# Patient Record
Sex: Male | Born: 1945
Health system: Southern US, Community
[De-identification: ages and names within clinical notes are randomized; demographics above are authoritative.]

## PROBLEM LIST (undated history)

## (undated) DIAGNOSIS — I219 Acute myocardial infarction, unspecified: Secondary | ICD-10-CM

## (undated) DIAGNOSIS — I255 Ischemic cardiomyopathy: Secondary | ICD-10-CM

## (undated) DIAGNOSIS — I4891 Unspecified atrial fibrillation: Secondary | ICD-10-CM

## (undated) DIAGNOSIS — K2100 Gastro-esophageal reflux disease with esophagitis, without bleeding: Secondary | ICD-10-CM

## (undated) DIAGNOSIS — E7800 Pure hypercholesterolemia, unspecified: Secondary | ICD-10-CM

## (undated) DIAGNOSIS — I2510 Atherosclerotic heart disease of native coronary artery without angina pectoris: Secondary | ICD-10-CM

## (undated) DIAGNOSIS — I509 Heart failure, unspecified: Secondary | ICD-10-CM

## (undated) DIAGNOSIS — K649 Unspecified hemorrhoids: Secondary | ICD-10-CM

## (undated) DIAGNOSIS — M47812 Spondylosis without myelopathy or radiculopathy, cervical region: Secondary | ICD-10-CM

## (undated) DIAGNOSIS — E559 Vitamin D deficiency, unspecified: Secondary | ICD-10-CM

## (undated) DIAGNOSIS — Z720 Tobacco use: Secondary | ICD-10-CM

## (undated) DIAGNOSIS — K219 Gastro-esophageal reflux disease without esophagitis: Secondary | ICD-10-CM

## (undated) DIAGNOSIS — D649 Anemia, unspecified: Secondary | ICD-10-CM

## (undated) DIAGNOSIS — I10 Essential (primary) hypertension: Secondary | ICD-10-CM

## (undated) HISTORY — DX: Unspecified atrial fibrillation: I48.91

## (undated) HISTORY — DX: Essential (primary) hypertension: I10

## (undated) HISTORY — DX: Pure hypercholesterolemia, unspecified: E78.00

## (undated) HISTORY — PX: CARDIAC CATHETERIZATION: SHX172

## (undated) HISTORY — DX: Atherosclerotic heart disease of native coronary artery without angina pectoris: I25.10

## (undated) HISTORY — DX: Tobacco use: Z72.0

## (undated) HISTORY — DX: Gastro-esophageal reflux disease without esophagitis: K21.9

## (undated) HISTORY — PX: COLONOSCOPY: SHX174

## (undated) HISTORY — DX: Anemia, unspecified: D64.9

## (undated) SURGERY — Surgical Case
Anesthesia: *Unknown

---
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
## 1998-11-25 HISTORY — PX: CORONARY STENT PLACEMENT: SHX1402

## 2007-12-08 ENCOUNTER — Encounter: Payer: Self-pay | Admitting: Cardiology

## 2009-09-15 ENCOUNTER — Encounter: Payer: Self-pay | Admitting: Cardiology

## 2009-10-14 ENCOUNTER — Encounter: Payer: Self-pay | Admitting: Cardiology

## 2009-12-22 ENCOUNTER — Encounter: Payer: Self-pay | Admitting: Cardiology

## 2010-01-31 DIAGNOSIS — I2510 Atherosclerotic heart disease of native coronary artery without angina pectoris: Secondary | ICD-10-CM | POA: Insufficient documentation

## 2010-01-31 DIAGNOSIS — F17200 Nicotine dependence, unspecified, uncomplicated: Secondary | ICD-10-CM | POA: Insufficient documentation

## 2010-01-31 DIAGNOSIS — I252 Old myocardial infarction: Secondary | ICD-10-CM | POA: Insufficient documentation

## 2010-01-31 DIAGNOSIS — E7800 Pure hypercholesterolemia, unspecified: Secondary | ICD-10-CM

## 2010-01-31 DIAGNOSIS — Z9861 Coronary angioplasty status: Secondary | ICD-10-CM | POA: Insufficient documentation

## 2010-02-01 ENCOUNTER — Encounter: Payer: Self-pay | Admitting: Cardiology

## 2010-04-13 ENCOUNTER — Ambulatory Visit: Payer: Self-pay | Admitting: Cardiology

## 2010-04-13 DIAGNOSIS — R809 Proteinuria, unspecified: Secondary | ICD-10-CM | POA: Insufficient documentation

## 2010-04-13 DIAGNOSIS — R1311 Dysphagia, oral phase: Secondary | ICD-10-CM | POA: Insufficient documentation

## 2010-05-08 ENCOUNTER — Telehealth (INDEPENDENT_AMBULATORY_CARE_PROVIDER_SITE_OTHER): Payer: Self-pay | Admitting: *Deleted

## 2010-08-02 ENCOUNTER — Encounter (INDEPENDENT_AMBULATORY_CARE_PROVIDER_SITE_OTHER): Payer: Self-pay | Admitting: *Deleted

## 2010-08-02 ENCOUNTER — Ambulatory Visit: Payer: Self-pay | Admitting: Physician Assistant

## 2010-12-25 NOTE — Letter (Signed)
Summary: External Correspondence/ PROGRESS NOTE DR. TYSINGER  External Correspondence/ PROGRESS NOTE DR. TYSINGER   Imported By: Dorise Hiss 01/31/2010 11:46:48  _____________________________________________________________________  External Attachment:    Type:   Image     Comment:   External Document

## 2010-12-25 NOTE — Assessment & Plan Note (Signed)
Summary: [facility]   Visit Type:  Follow-up Primary Provider:  Kristian Covey   History of Present Illness: patient presents for scheduling of a mandatory stress test. 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   Since he was initially seen here in the clinic in May, by Dr. Andee Lineman, patient denies any interim development of exertional angina pectoris or significant dyspnea. Unfortunately, he continues to smoke. He reports compliance with his medications.  When last seen, he was to have had a test for microalbuminuria. Unfortunately, this was not arranged.  Current Medications (verified): 1)  Bisoprolol Fumarate 10 Mg Tabs (Bisoprolol Fumarate) .... Take 1 Tablet By Mouth Once A Day 2)  Pravachol 80 Mg Tabs (Pravastatin Sodium) .... Take 1 Tablet By Mouth Once A Day 3)  Aspir-Low 81 Mg Tbec (Aspirin) .... Take 1 Tablet By Mouth Once A Day 4)  Amlodipine Besylate 10 Mg Tabs (Amlodipine Besylate) .... Take 1 Tablet By Mouth Once A Day 5)  Lisinopril 10 Mg Tabs (Lisinopril) .... Take 1 Tablet By Mouth Once A Day  Allergies (verified): No Known Drug Allergies  Past History:  Past Medical History: Last updated: 01/31/2010 Hypertension Elevated Cholesterol CAD with history of MI in 2000 Stent placed 2000 tobacco Use   Review of Systems       No fevers, chills, hemoptysis, dysphagia, melena, hematocheezia, hematuria, rash, claudication, orthopnea, pnd, pedal edema. All other systems negative.   Vital Signs:  Patient profile:   65 year old male Height:      64 inches Weight:      158 pounds BMI:     27.22 Pulse rate:   62 / minute Resp:     16 per minute BP sitting:   161 / 77  (left arm)  Vitals Entered By: Marrion Coy, CNA (August 02, 2010 1:25 PM)  Physical Exam  Additional Exam:  GEN: 65 year old male, sitting upright, no distress HEENT: NCAT,PERRLA,EOMI NECK: palpable pulses, no bruits; no JVD; no  TM LUNGS: CTA bilaterally HEART: RRR (S1S2); no significant murmurs; no rubs; no gallops ABD: soft, NT; intact BS EXT: intact distal pulses; no edema SKIN: warm, dry MUSC: no obvious deformity NEURO: A/O (x3)     EKG  Procedure date:  08/02/2010  Findings:      normal sinus rhythm at 60 bpm; normal axis; inferolateral T-wave inversion, with probable LVH. No previous study for comparison.  Impression & Recommendations:  Problem # 1:  CORONARY ATHEROSCLEROSIS NATIVE CORONARY ARTERY (ICD-414.01)  we'll schedule exercise stress Cardiolite test for tomorrow. If negative for ischemia, then patient is cleared to start his new job this following Monday. He will need to bring a copy of the report with him, as he is leaving this weekend for his new job in [place]. If, however, there is evidence of ischemia, then we will need to consider further evaluation with a cardiac catheterization. His resting EKG today does indicate possible inferolateral ischemia. However, he has no previous study for comparison, but presents with no complaints of chest pain. We will insure that he has p.r.n. nitroglycerin, as well, to take with him. We'll plan on having him return to the office in 6 months, to follow with Dr. Andee Lineman.  Problem # 2:  ESSENTIAL HYPERTENSION, BENIGN (ICD-401.1)  increase lisinopril to 20 mg daily. Check followup metabolic panel in 2 weeks (when patient returns from his job to be with family over the weekend). Will also  reorder urine for microalbuminuria.  Problem # 3:  NONDEPENDENT TOBACCO USE DISORDER (ICD-305.1)  patient was counseled to stop smoking tobacco.  Problem # 4:  DYSLIPIDEMIA (ICD-272.4)  we'll assess lipid status with a fasting lipid/liver profile. Target LDL of 70 or less, if feasible  Other Orders: Nuclear Med (Nuc Med) T-Basic Metabolic Panel [phone]) T-Urine Microalbumin w/creat. ratio [phone]) T-Lipid Profile [phone]) T-Hepatic Function  [phone])  Patient Instructions: 1)  Your physician has requested that you have an exercise stress cardiolite.  For further information please visit [email or web address]  Please follow instruction sheet, as given. If the results of your test are normal or stable, you will receive a letter. If they are abnormal, the nurse will contact you by phone.  2)  Your physician recommends that you go to the [facility] for lab work: DO IN 2 WEEKS AROUND 08/16/10. Do not eat or drink after midnight.  3)  Your physician recommended you take 1 tablet (or 1 spray) under tongue at onset of chest pain; you may repeat every 5 minutes for up to 3 doses. If 3 or more doses are required, call 911 and proceed to the ER immediately. 4)  Increase Lisinporil to 20mg  by mouth once daily.  Prescriptions: NITROSTAT 0.4 MG SUBL (NITROGLYCERIN) 1 tablet under tongue at onset of chest pain; you may repeat every 5 minutes for up to 3 doses.  #25 x 3   Entered by:   Cyril Loosen, RN, BSN   Authorized by:   Nelida Meuse, PA-C   Signed by:   Cyril Loosen, RN, BSN on 08/02/2010   Method used:   Electronically to        [facility] (retail)       [address]       [place], [place]  [address]       Ph: [phone]       Fax: [phone]   RxID:   [id number] LISINOPRIL 20 MG TABS (LISINOPRIL) Take one tablet by mouth daily  #30 x 6   Entered by:   Cyril Loosen, RN, BSN   Authorized by:   Nelida Meuse, PA-C   Signed by:   Cyril Loosen, RN, BSN on 08/02/2010   Method used:   Electronically to        [facility] (retail)       [address]       [place], [place]  [address]       Ph: [phone]       Fax: [phone]   RxID:   [id number]  I have reviewed and approved all prescriptions at the time of the office visit. Nelida Meuse, PA-C  August 02, 2010 2:25 PM

## 2010-12-25 NOTE — Progress Notes (Signed)
Summary: NEED MICROALBUMIN  ---- Converted from flag ---- ---- 05/07/2010 9:06 AM, Lewayne Bunting, MD, [facility] wrote: I need microalbuminuria test- [place] is useless. Need to get order in correctly Lisinopril only started if +/see my note .  Thanks  ---- 04/25/2010 4:41 PM, Carlye Grippe wrote: this patient had UA done not microalbumin. does he need to start the lisinopril or not? I fowarded the ua to PCP ------------------------------  Phone Note Outgoing Call Call back at [facility] Phone [phone]   Call placed by: Carlye Grippe,  May 08, 2010 10:22 AM Call placed to: Patient's wife Summary of Call: called and and infomred wife that patient need Microalbumin done before starting lisinopril. because of patients work hours,nurse infomed wife that someone can go and pick up specimen cup from [facility],then drop off after collection.  Initial call taken by: Carlye Grippe,  May 08, 2010 10:23 AM

## 2010-12-25 NOTE — Letter (Signed)
Summary: External Correspondence/ PROGRESS NOTE DR. TYSINGER  External Correspondence/ PROGRESS NOTE DR. TYSINGER   Imported By: Dorise Hiss 01/31/2010 11:45:06  _____________________________________________________________________  External Attachment:    Type:   Image     Comment:   External Document

## 2010-12-25 NOTE — Consult Note (Signed)
Summary: [facility] DR. Truman Hayward  [facility] DR. Truman Hayward   Imported By: Zachary George 01/31/2010 10:44:54  _____________________________________________________________________  External Attachment:    Type:   Image     Comment:   External Document

## 2010-12-25 NOTE — Letter (Signed)
Summary: External Correspondence/ PROGRESS NOTE DR. TYSINGER  External Correspondence/ PROGRESS NOTE DR. TYSINGER   Imported By: Dorise Hiss 01/31/2010 11:42:57  _____________________________________________________________________  External Attachment:    Type:   Image     Comment:   External Document

## 2010-12-25 NOTE — Letter (Signed)
Summary: Appointment -missed  [facility] [address]   [place], [place] [address]   Phone: [phone]  Fax: [phone]     February 01, 2010 MRN: [id number]     Ronald Mcdowell [address] [place], [place]  [address]     Dear Mr. LOS,  Our records indicate you missed your appointment on February 01, 2010                        with Dr.   Andee Lineman.   It is very important that we reach you to reschedule this appointment. We look forward to participating in your health care needs.   Please contact us at the number listed above at your earliest convenience to reschedule this appointment.   Sincerely,    Glass blower/designer

## 2010-12-25 NOTE — Assessment & Plan Note (Signed)
Summary: NP-ESTABLISHING WITH CARDIOLOGIST   Visit Type:  Initial Consult Primary Provider:  Kristian Covey  CC:  establishment.  History of Present Illness: the patient is a 65 year old male with a history of myocardial infarction in 2000 status post stent placement. This was performed in [place]. The patient has had no cardiology followup here in [place]. He did have auscultation approximately 2 years ago with neck cellulitis with MRSA. He still complains of dysphagia but no odynophagia, in particular he has difficulty with solid foods. He states however that his symptoms have worsened.  On recent physical examination a UA specimen was obtained and he was found to have proteinuria. However he has normal renal function. His wife tells me that he has been referred to urology he also has a history of hematuria and erectile dysfunction.is not clear the patient also has microalbuminuria and he will be tested for this.  at the end of 2010 the patient was evaluated in the ED and [place] for recurrent central chest pain. He reportedly ruled out for myocardial infarction. He had a stress test done which was negative for ischemia. He does have a history of hypertension which appears to be poorly controlled. He is compliant with his medications and is on high dose statin therapy. His most recent LDL is 123 with an HDL of 44. This reportedly however hospital for high-dose statin drug therapy. Further followup will be per his primary care physician.    Preventive Screening-Counseling & Management  Alcohol-Tobacco     Smoking Status: current     Smoking Cessation Counseling: yes     Packs/Day: 1/2 PPD  Current Medications (verified): 1)  Bisoprolol Fumarate 10 Mg Tabs (Bisoprolol Fumarate) .... Take 1 Tablet By Mouth Once A Day 2)  Pravachol 80 Mg Tabs (Pravastatin Sodium) .... Take 1 Tablet By Mouth Once A Day 3)  Aspir-Low 81 Mg Tbec (Aspirin) .... Take 1 Tablet By Mouth Once A Day 4)  Amlodipine  Besylate 10 Mg Tabs (Amlodipine Besylate) .... Take 1 Tablet By Mouth Once A Day 5)  Lisinopril 10 Mg Tabs (Lisinopril) .... Take 1 Tablet By Mouth Once A Day  Allergies (verified): No Known Drug Allergies  Comments:  Nurse/Medical Assistant: The patient's medications and allergies were reviewed with the patient and were updated in the Medication and Allergy Lists. List reviewed.  Past History:  Past Medical History: Last updated: 01/31/2010 Hypertension Elevated Cholesterol CAD with history of MI in 2000 Stent placed 2000 tobacco Use   Past Surgical History: Last updated: 01/31/2010 Cardiac Catheterization Stent placement  Family History: Reviewed history and no changes required. noncontributory  Social History: Reviewed history from 01/31/2010 and no changes required. smoking -yes Married  Regular Exercise - yes Smoking Status:  current Packs/Day:  1/2 PPD  Review of Systems  The patient denies fatigue, malaise, fever, weight gain/loss, vision loss, decreased hearing, hoarseness, chest pain, palpitations, shortness of breath, prolonged cough, wheezing, sleep apnea, coughing up blood, abdominal pain, blood in stool, nausea, vomiting, diarrhea, heartburn, incontinence, blood in urine, muscle weakness, joint pain, leg swelling, rash, skin lesions, headache, fainting, dizziness, depression, anxiety, enlarged lymph nodes, easy bruising or bleeding, and environmental allergies.    Vital Signs:  Patient profile:   65 year old male Height:      64 inches Weight:      160 pounds BMI:     27.56 Pulse rate:   57 / minute BP sitting:   157 / 75  (left arm) Cuff size:  regular  Vitals Entered By: Carlye Grippe (Apr 13, 2010 8:41 AM)  Nutrition Counseling: Patient's BMI is greater than 25 and therefore counseled on weight management options. CC: establishment   Physical Exam  Additional Exam:  General: Well-developed, well-nourished in no distress head:  Normocephalic and atraumatic eyes PERRLA/EOMI intact, conjunctiva and lids normal nose: No deformity or lesions mouth normal dentition, normal posterior pharynx neck: Supple, no JVD.  No masses, thyromegaly or abnormal cervical nodes lungs: Normal breath sounds bilaterally without wheezing.  Normal percussion heart: regular rate and rhythm with normal S1 and S2, no S3 or S4.  PMI is normal.  No pathological murmurs abdomen: Normal bowel sounds, abdomen is soft and nontender without masses, organomegaly or hernias noted.  No hepatosplenomegaly musculoskeletal: Back normal, normal gait muscle strength and tone normal pulsus: Pulse is normal in all 4 extremities Extremities: No peripheral pitting edema neurologic: Alert and oriented x 3 skin: Intact without lesions or rashes cervical nodes: No significant adenopathy psychologic: Normal affect    EKG  Procedure date:  04/13/2010  Findings:      sinus bradycardia, LVH nonspecific T wave changes.  Heart rate 54 beats/min  Impression & Recommendations:  Problem # 1:  CORONARY ATHEROSCLEROSIS NATIVE CORONARY ARTERY (ICD-414.01) the patient is status post prior myocardial infarction with a stent in 2000.  EKG however shows no acute ischemic changes.  He had a stress echocardiogram done in 2010 which was negative. His updated medication list for this problem includes:    Bisoprolol Fumarate 10 Mg Tabs (Bisoprolol fumarate) .[name]... Take 1 tablet by mouth once a day    Aspir-low 81 Mg Tbec (Aspirin) .[name]... Take 1 tablet by mouth once a day    Amlodipine Besylate 10 Mg Tabs (Amlodipine besylate) .[name]... Take 1 tablet by mouth once a day    Lisinopril 10 Mg Tabs (Lisinopril) .[name]... Take 1 tablet by mouth once a day  Orders: EKG w/ Interpretation (93000) T-Urinalysis ([id number])  Problem # 2:  PROTEINURIA (ICD-791.0)  the patient will be tested for microalbuminuria and he will be started on an ACE inhibitor with lisinopril 10 mg p.o. q. daily,  particularly in light of his hypertension.  Orders: T-Basic Metabolic Panel [id number])  Problem # 3:  POSTSURG PERCUT TRANSLUMINAL COR ANGPLSTY STS (ICD-V45.82) Assessment: Comment Only  Problem # 4:  PURE HYPERCHOLESTEROLEMIA (ICD-272.0) continue risk factor modification with high-dose statin. His updated medication list for this problem includes:    Pravachol 80 Mg Tabs (Pravastatin sodium) .[name]... Take 1 tablet by mouth once a day  Problem # 5:  DYSPHAGIA ORAL PHASE (ICD-787.21) the patient appears to have some dysphagia.  He has a prior history of neck cellulitis with MRSA  Asked him to discuss this with his primary care physician  Patient Instructions: 1)  Urinalysis for microablumineria 2)  If above positive, then start Lisinopril 10mg  daily  3)  Labs:  one week after starting above - BMET 4)  Follow up in  1 year.    Prescriptions: LISINOPRIL 10 MG TABS (LISINOPRIL) Take 1 tablet by mouth once a day  #30 x 11   Entered by:   Hoover Brunette, LPN   Authorized by:   Lewayne Bunting, MD, [facility]   Signed by:   Hoover Brunette, LPN on 66/04/3015   Method used:   Electronically to        [facility] (retail)       [address]  [place], [place]  [address]       Ph: [phone]       Fax: [phone]   RxID:   [id number]   Handout requested.

## 2010-12-25 NOTE — Letter (Signed)
Summary: Pharmacist, community at [facility] [address]   [place], [place] [address]   Phone: [phone]  Fax: [phone]      [facility] Cardiovascular Services  Cardiolite Stress Test     [facility]  Your doctor has ordered a Cardiolite Stress Test to help determine the condition of your heart during stress. If you take blood pressure medicine, ask your doctor if you should take it the day of your test. You should not have anything to eat or drink at least 4 hours before your test is scheduled, and no caffeine (coffee, tea, decaf. or chocolate) for 24 hours before your test.   You will need to register at the Outpatient/Main Entrance at the hospital 30 minutes before your appointment time. It is a good idea to bring a copy of your order with you. They will direct you to the Diagnostic Imaging (Radiology) Department.  You will be asked to undress from the waist up and be given a hospital gown to wear, so dress comfortably from the waist down, for example:    Sweat pants, shorts or skirt   Rubber-soled lace up shoes (i.e. tennis shoes)  Plan on about three hours from registration to release from the hospital.    Date of Test:              Time of Test              Do not take Bisoprolol the night before or the morning of your test.

## 2011-01-18 DIAGNOSIS — R079 Chest pain, unspecified: Secondary | ICD-10-CM

## 2011-05-23 ENCOUNTER — Encounter: Payer: Self-pay | Admitting: Physician Assistant

## 2011-12-09 ENCOUNTER — Ambulatory Visit (HOSPITAL_COMMUNITY)
Admission: RE | Admit: 2011-12-09 | Discharge: 2011-12-09 | Disposition: A | Discharge status: Home / Self Care | Payer: Commercial Managed Care - PPO | Source: Ambulatory Visit | Attending: Urology | Admitting: Urology

## 2011-12-09 ENCOUNTER — Other Ambulatory Visit (HOSPITAL_COMMUNITY): Payer: Self-pay | Admitting: Urology

## 2011-12-09 DIAGNOSIS — N400 Enlarged prostate without lower urinary tract symptoms: Secondary | ICD-10-CM | POA: Insufficient documentation

## 2011-12-09 DIAGNOSIS — I10 Essential (primary) hypertension: Secondary | ICD-10-CM | POA: Insufficient documentation

## 2011-12-09 DIAGNOSIS — R972 Elevated prostate specific antigen [PSA]: Secondary | ICD-10-CM

## 2012-01-06 ENCOUNTER — Encounter (HOSPITAL_COMMUNITY): Payer: Self-pay | Admitting: Pharmacy Technician

## 2012-01-07 ENCOUNTER — Other Ambulatory Visit: Payer: Self-pay

## 2012-01-07 ENCOUNTER — Encounter (HOSPITAL_COMMUNITY): Payer: Self-pay

## 2012-01-07 ENCOUNTER — Encounter (HOSPITAL_COMMUNITY)
Admission: RE | Admit: 2012-01-07 | Discharge: 2012-01-07 | Disposition: A | Discharge status: Home / Self Care | Payer: Commercial Managed Care - PPO | Source: Ambulatory Visit | Attending: Urology | Admitting: Urology

## 2012-01-07 HISTORY — DX: Acute myocardial infarction, unspecified: I21.9

## 2012-01-07 LAB — HEMOGLOBIN AND HEMATOCRIT, BLOOD
HCT: 42.6 % (ref 39.0–52.0)
Hemoglobin: 14.9 g/dL (ref 13.0–17.0)

## 2012-01-07 LAB — BASIC METABOLIC PANEL
BUN: 10 mg/dL (ref 6–23)
Chloride: 104 mEq/L (ref 96–112)
GFR calc Af Amer: >90 mL/min (ref >90)
Potassium: 3.2 mEq/L — ABNORMAL LOW (ref 3.5–5.1)
Sodium: 142 mEq/L (ref 135–145)

## 2012-01-07 NOTE — Patient Instructions (Signed)
20 Ronald Mcdowell  01/07/2012   Your procedure is scheduled on:  01/09/12  Report to [facility] at 10:10 AM.  Call this number if you have problems the morning of surgery: [phone]   Remember:   Do not eat food:After Midnight.  May have clear liquids:until Midnight .  Clear liquids include soda, tea, black coffee, apple or grape juice, broth.  Take these medicines the morning of surgery with A SIP OF WATER: Norvasc, Zebeta, and Prinivil.   Do not wear jewelry, make-up or nail polish.  Do not wear lotions, powders, or perfumes. You may wear deodorant.  Do not shave 48 hours prior to surgery.  Do not bring valuables to the hospital.  Contacts, dentures or bridgework may not be worn into surgery.  Leave suitcase in the car. After surgery it may be brought to your room.  For patients admitted to the hospital, checkout time is 11:00 AM the day of discharge.   Patients discharged the day of surgery will not be allowed to drive home.  Name and phone number of your driver:   Special Instructions: CHG Shower Use Special Wash: 1/2 bottle night before surgery and 1/2 bottle morning of surgery.   Please read over the following fact sheets that you were given: Pain Booklet, MRSA Information, Surgical Site Infection Prevention, Anesthesia Post-op Instructions and Care and Recovery After Surgery    Transurethral Resection of the Prostate Transurethral Resection of the Prostate (TURP) is often treatment for non-cancerous (benign) prostatic hyperplasia (BPH) or prostate cancer. BPH commonly begins in middle aged men and can cause symptoms at any age thereafter. The complications that stem from these problems, such as recurrent infection and bladder control and emptying problems, are often helped by this procedure. Both conditions usually cause the prostate to increase in size. TURP is a major surgery that removes part of the prostate gland. The goal is to remove enough prostate to allow for unobstructed  flow of urine. TREATMENT This surgery (TURP procedure) is done using an instrument like a narrow telescope to look into your bladder. This is then used to remove enlarged pieces of your prostate, one piece at a time. This removes the blockage and makes it easier for you to urinate. This is called a transurethral resection of the prostate. A lesser procedure is sometimes done. In this procedure, small cuts are made in the prostate. This lessens the prostates pressure on the urethra. This is called a transurethral incision (cut by the surgeon) of the prostate (TUIP). You will probably be comfortable soon after the operation, but it will take 10 to 12 weeks, or longer, for your prostate to heal after you leave the hospital.  LET YOUR CAREGIVER KNOW ABOUT:  Allergies.   Medications taken including herbs, eye drops, over the counter medications, and creams.   Use of steroids (by mouth or creams).   Previous problems with anesthetics or Novocaine.   History of blood clots (thrombophlebitis).   History of bleeding or blood problems.   Previous surgery.   Previous prostate infections.   Other health problems.  RISKS AND COMPLICATIONS  Rare injury to the bowel (intestine).   Rare injury to the bladder or adjacent blood vessels.   Intestinal/bowel obstruction.   Scarring called "stricture" that cause later problems with the flow of urine.   Bleeding and the need for blood transfusion (more common in those with prostate cancer and those who have previously received radiation therapy).   Inability to control your urine (  incontinence). This is more common in those with prostate cancer and those who have received radiation therapy.   Injury to one of the ureters (tubes that drain the kidneys into the bladder) and/or urethra (the tube that drains the bladder).   Injury to the capsule that holds the prostate. This can lead to leakage of fluid and urine into the belly (abdomen).   The  operation can possibly lead to impotence. This is the inability to get an erection. Treatments are available for these types of problems.   Rare over absorption of the fluids used during the operation. This can then cause low blood levels of sodium, brain swelling, strain on the heart, and fluid accumulation in the lungs. This is more common in long procedures.   Infection.   Blood clots in the legs.   As with any major surgery, there is always the rare chance of a complicating stroke, heart attack, or other complications that will be discussed with you by the surgeon and anesthesiologist.  BEFORE THE PROCEDURE   You may be asked to temporarily adjust your diet. If so, your caregiver will give you specific recommendations.   If you are on blood thinners, stop taking them before the operation, or as your caregiver advises.   You should have nothing to eat or drink after midnight prior to your surgery or as suggested by your caregiver. You may have a sip of water to take medications not stopped for the procedure  PROCEDURE  This operation is performed after you have been given a medication to help you sleep (anesthetic), or with a spinal block. The spinal block keeps you awake but numb from the waist down.  No cut or incision is needed. During the operation, your surgeon passes a viewing and cutting instrument (resectoscope) through the penis into the prostate gland. The instrument contains an electric cutting edge. From inside the prostate, this cutter is used to remove part of the prostate.  HOME CARE INSTRUCTIONS  For your own protection, observe the following precautions for 10 days after your operation.  You may go home with a catheter. Take care of it as directed. You will receive instruction on catheter care.   After catheter removal, empty the bladder whenever you feel a definite desire. Do not try to hold the urine for long periods of time.   For 10 days, avoid all lifting,  straining, running, strenuous work, walks longer than a couple blocks, riding in a car for extended periods, and sexual relations.   Take 2 tablespoons of heavy mineral oil or Metamucil night and morning for 3 or 4 days. After that, gradually reduce the dose to one or two teaspoons twice daily. Stop it after the stools have been normal for a week. If you become constipated, do not strain to move your bowels. You may use an enema. Notify your caregiver about problems.   Even after complete healing, you may continue to urinate once or twice during the night.   In addition to your usual medications, you may be given an antibiotic to take for 10-14 days. Notify your caregiver if you have any side effects or problems with the medication.   Avoid alcohol and caffeinated drinks for 2 weeks, as they are irritating to the bladder. Decaffeinated drinks are fine.   Eat a regular diet, avoiding spicy foods for 2 weeks.   You may continue non-strenuous activities. It is always important to keep active after an operation. This lessens the chance of  developing blood clots.   You may see some recurrence of blood in the urine after discharge from the hospital. Even a small amount of blood colors the urine very red. If this occurs, force fluids again as you did in the hospital. This is generally not a concern.  SEEK MEDICAL CARE IF:   You have chills or night sweats.   You are leaking around your catheter or have problems with your catheter.   You develop side effects that you think are coming from your medications.  SEEK IMMEDIATE MEDICAL CARE IF:   You are suddenly unable to urinate. This is an emergency.   You develop shortness of breath or chest pains.   Bleeding persists or clots develop.   You have a fever.   You develop pain in your back or over your lower belly (abdomen).   You develop pain or swelling in your legs.   You develop swelling in your abdomen or have a sudden weight gain.    The problems get worse rather than better.  Document Released: 11/11/2005 Document Revised: 07/24/2011 Document Reviewed: 05/30/2009 [facility] Patient Information 2012 [place], [place].

## 2012-01-09 ENCOUNTER — Encounter (HOSPITAL_COMMUNITY): Payer: Self-pay

## 2012-01-09 ENCOUNTER — Other Ambulatory Visit: Payer: Self-pay | Admitting: Urology

## 2012-01-09 ENCOUNTER — Encounter (HOSPITAL_COMMUNITY): Payer: Self-pay | Admitting: Anesthesiology

## 2012-01-09 ENCOUNTER — Ambulatory Visit (HOSPITAL_COMMUNITY)
Admission: RE | Admit: 2012-01-09 | Discharge: 2012-01-10 | Disposition: A | Discharge status: Home / Self Care | Payer: Commercial Managed Care - PPO | Source: Ambulatory Visit | Attending: Urology | Admitting: Urology

## 2012-01-09 ENCOUNTER — Encounter (HOSPITAL_COMMUNITY)
Admission: RE | Disposition: A | Discharge status: Home / Self Care | Payer: Self-pay | Source: Ambulatory Visit | Attending: Urology

## 2012-01-09 ENCOUNTER — Encounter (HOSPITAL_COMMUNITY): Payer: Self-pay | Admitting: *Deleted

## 2012-01-09 ENCOUNTER — Ambulatory Visit (HOSPITAL_COMMUNITY): Payer: Commercial Managed Care - PPO | Admitting: Anesthesiology

## 2012-01-09 DIAGNOSIS — N400 Enlarged prostate without lower urinary tract symptoms: Secondary | ICD-10-CM | POA: Insufficient documentation

## 2012-01-09 DIAGNOSIS — Z01810 Encounter for preprocedural cardiovascular examination: Secondary | ICD-10-CM | POA: Insufficient documentation

## 2012-01-09 DIAGNOSIS — Z79899 Other long term (current) drug therapy: Secondary | ICD-10-CM | POA: Insufficient documentation

## 2012-01-09 DIAGNOSIS — I252 Old myocardial infarction: Secondary | ICD-10-CM | POA: Insufficient documentation

## 2012-01-09 DIAGNOSIS — R9431 Abnormal electrocardiogram [ECG] [EKG]: Secondary | ICD-10-CM | POA: Insufficient documentation

## 2012-01-09 DIAGNOSIS — I10 Essential (primary) hypertension: Secondary | ICD-10-CM | POA: Insufficient documentation

## 2012-01-09 DIAGNOSIS — Z01812 Encounter for preprocedural laboratory examination: Secondary | ICD-10-CM | POA: Insufficient documentation

## 2012-01-09 HISTORY — PX: TRANSURETHRAL RESECTION OF PROSTATE: SHX73

## 2012-01-09 LAB — TYPE AND SCREEN
ABO/RH(D): A POS
Antibody Screen: NEGATIVE

## 2012-01-09 LAB — POTASSIUM: Potassium: 3.5 mEq/L (ref 3.5–5.1)

## 2012-01-09 SURGERY — TURP (TRANSURETHRAL RESECTION OF PROSTATE)
Anesthesia: Spinal | Site: Bladder | Wound class: Clean Contaminated

## 2012-01-09 MED ORDER — SODIUM CHLORIDE 0.9 % IR SOLN
3000.0000 mL | Status: DC
  Administered 2012-01-09 – 2012-01-10 (×16): 3000 mL

## 2012-01-09 MED ORDER — LISINOPRIL 10 MG PO TABS
20.0000 mg | ORAL_TABLET | Freq: Every day | ORAL | Status: DC
  Administered 2012-01-09 – 2012-01-10 (×2): 20 mg via ORAL
  Filled 2012-01-09 (×2): qty 2

## 2012-01-09 MED ORDER — AMLODIPINE BESYLATE 5 MG PO TABS
10.0000 mg | ORAL_TABLET | Freq: Every day | ORAL | Status: DC
  Administered 2012-01-09 – 2012-01-10 (×2): 10 mg via ORAL
  Filled 2012-01-09 (×2): qty 2

## 2012-01-09 MED ORDER — PROPOFOL 10 MG/ML IV EMUL
INTRAVENOUS | Status: DC | PRN
  Administered 2012-01-09: 25 ug/kg/min via INTRAVENOUS

## 2012-01-09 MED ORDER — GLYCINE 1.5 % IR SOLN
Status: DC | PRN
  Administered 2012-01-09: 18000 mL

## 2012-01-09 MED ORDER — MIDAZOLAM HCL 2 MG/2ML IJ SOLN
INTRAMUSCULAR | Status: AC
  Filled 2012-01-09: qty 2

## 2012-01-09 MED ORDER — PROPOFOL 10 MG/ML IV EMUL
INTRAVENOUS | Status: AC
  Filled 2012-01-09: qty 20

## 2012-01-09 MED ORDER — ONDANSETRON HCL 4 MG/2ML IJ SOLN: 4.0000 mg | Freq: Once | INTRAMUSCULAR | Status: DC | PRN

## 2012-01-09 MED ORDER — BISOPROLOL FUMARATE 5 MG PO TABS
10.0000 mg | ORAL_TABLET | Freq: Every day | ORAL | Status: DC
  Administered 2012-01-09 – 2012-01-10 (×2): 10 mg via ORAL
  Filled 2012-01-09: qty 1
  Filled 2012-01-09: qty 2
  Filled 2012-01-09: qty 1
  Filled 2012-01-09 (×2): qty 2

## 2012-01-09 MED ORDER — GLYCINE 1.5 % IR SOLN
Status: DC | PRN
  Administered 2012-01-09: 6000 mL

## 2012-01-09 MED ORDER — FENTANYL CITRATE 0.05 MG/ML IJ SOLN
INTRAMUSCULAR | Status: DC | PRN
  Administered 2012-01-09: 50 ug via INTRAVENOUS

## 2012-01-09 MED ORDER — MIDAZOLAM HCL 2 MG/2ML IJ SOLN
1.0000 mg | INTRAMUSCULAR | Status: DC | PRN
  Administered 2012-01-09: 2 mg via INTRAVENOUS

## 2012-01-09 MED ORDER — STERILE WATER FOR IRRIGATION IR SOLN
Status: DC | PRN
  Administered 2012-01-09: 1000 mL

## 2012-01-09 MED ORDER — LACTATED RINGERS IV SOLN
INTRAVENOUS | Status: DC
  Administered 2012-01-09: 1000 mL via INTRAVENOUS
  Administered 2012-01-09: 13:00:00 via INTRAVENOUS

## 2012-01-09 MED ORDER — NITROGLYCERIN 0.4 MG SL SUBL: 0.4000 mg | SUBLINGUAL_TABLET | SUBLINGUAL | Status: DC | PRN

## 2012-01-09 MED ORDER — HYDROCODONE-ACETAMINOPHEN 5-325 MG PO TABS
1.0000 | ORAL_TABLET | Freq: Four times a day (QID) | ORAL | Status: DC | PRN
  Filled 2012-01-09: qty 1

## 2012-01-09 MED ORDER — FENTANYL CITRATE 0.05 MG/ML IJ SOLN
INTRAMUSCULAR | Status: AC
  Filled 2012-01-09: qty 2

## 2012-01-09 MED ORDER — BUPIVACAINE IN DEXTROSE 0.75-8.25 % IT SOLN
INTRATHECAL | Status: AC
  Filled 2012-01-09: qty 2

## 2012-01-09 MED ORDER — DEXTROSE-NACL 5-0.45 % IV SOLN
INTRAVENOUS | Status: DC
  Administered 2012-01-09: 18:00:00 via INTRAVENOUS
  Administered 2012-01-10: 100 mL/h via INTRAVENOUS

## 2012-01-09 MED ORDER — FENTANYL CITRATE 0.05 MG/ML IJ SOLN: 25.0000 ug | INTRAMUSCULAR | Status: DC | PRN

## 2012-01-09 SURGICAL SUPPLY — 32 items
BAG DECANTER FOR FLEXI CONT (MISCELLANEOUS) ×2 IMPLANT
BAG DRAIN URO TABLE W/ADPT NS (DRAPE) ×2 IMPLANT
BAG URINE DRAIN TURP 4L (OSTOMY) IMPLANT
CABLE HI FREQUENCY MONOPOLAR (ELECTROSURGICAL) ×2 IMPLANT
CATH FOLEY 3WAY 30CC 22F (CATHETERS) IMPLANT
CLOTH BEACON ORANGE TIMEOUT ST (SAFETY) ×2 IMPLANT
CONNECTOR 5 IN 1 STRAIGHT STRL (MISCELLANEOUS) IMPLANT
DRAPE STERI URO 23X35 APER SZ5 (DRAPE) ×2 IMPLANT
ELECT CUT LOOP C-MAX 27FR .012 (CUTTING LOOP) ×2
ELECT REM PT RETURN 9FT ADLT (ELECTROSURGICAL) ×2
ELECTRODE CUT LP CMX 27FR .012 (CUTTING LOOP) ×1 IMPLANT
ELECTRODE REM PT RTRN 9FT ADLT (ELECTROSURGICAL) ×1 IMPLANT
FLOOR PAD 36X40 (MISCELLANEOUS)
FORMALIN 10 PREFIL 480ML (MISCELLANEOUS) IMPLANT
GLOVE BIO SURGEON STRL SZ7 (GLOVE) ×2 IMPLANT
GLOVE ECLIPSE 6.5 STRL STRAW (GLOVE) ×2 IMPLANT
GLOVE EXAM NITRILE MD LF STRL (GLOVE) ×2 IMPLANT
GLOVE INDICATOR 7.0 STRL GRN (GLOVE) ×4 IMPLANT
GLYCINE 1.5% IRRIG UROMATIC (IV SOLUTION) ×16 IMPLANT
GOWN STRL REIN XL XLG (GOWN DISPOSABLE) ×4 IMPLANT
IV NS IRRIG 3000ML ARTHROMATIC (IV SOLUTION) IMPLANT
KIT ROOM TURNOVER AP CYSTO (KITS) ×2 IMPLANT
MANIFOLD NEPTUNE II (INSTRUMENTS) ×2 IMPLANT
PACK CYSTO (CUSTOM PROCEDURE TRAY) ×2 IMPLANT
PAD ARMBOARD 7.5X6 YLW CONV (MISCELLANEOUS) ×2 IMPLANT
PAD FLOOR 36X40 (MISCELLANEOUS) IMPLANT
SET IRRIGATING DISP (SET/KITS/TRAYS/PACK) ×2 IMPLANT
SYR 30ML LL (SYRINGE) ×2 IMPLANT
TOWEL OR 17X26 4PK STRL BLUE (TOWEL DISPOSABLE) ×2 IMPLANT
WATER STERILE IRR 1000ML POUR (IV SOLUTION) ×2 IMPLANT
XPEEDA 550 SIDEFIRING FIBER (MISCELLANEOUS) IMPLANT
YANKAUER SUCT BULB TIP 10FT TU (MISCELLANEOUS) ×2 IMPLANT

## 2012-01-09 NOTE — Progress Notes (Signed)
No change in H&P on reexamination. 

## 2012-01-09 NOTE — H&P (Signed)
Ronald Mcdowell, MINER                ACCOUNT NO.:  [id number]  MEDICAL RECORD NO.:  [id number]  LOCATION:                                 FACILITY:  PHYSICIAN:  Ky Barban, M.D.DATE OF BIRTH:  17-Dec-1945  DATE OF ADMISSION: DATE OF DISCHARGE:  LH                             HISTORY & PHYSICAL   CHIEF COMPLAINT:  Symptoms of prostatism.  HISTORY:  A 66 year old gentleman who was seen first by me on January 14th and he told me 3 weeks before that he was in [place], went into retention, went to the emergency room and they catheterized him and got large amount of urine.  He was sent home without any catheter and he is voiding, but his urinary stream is slow and weak.  He has symptoms of prostatism for long time and then last week I did a cystoscopy, was found to have BPH with trilobar hypertrophy of the prostate with bladder neck obstruction.  He was not treated with Flomax.  PAST MEDICAL HISTORY:  He has hypertension and takes medicine and had MI in 2000 and has coronary stent since then, but not any on blood thinner. No history of any other significant medical problem.  FAMILY HISTORY:  No history of prostate cancer.  PERSONAL HISTORY:  Smokes half pack a day.  Does not drink alcohol, and he is married, lives with his wife.  REVIEW OF SYSTEMS:  Denies any chest pain, orthopnea, PND, nausea, vomiting.  MEDICATIONS:  He takes amlodipine, Prevacid, and lisinopril.  PHYSICAL EXAMINATION:  VITAL SIGNS:  Moderately built, not in acute distress. VITAL SIGNS:  Blood pressure 107/58, temperature 97.9. CENTRAL NERVOUS SYSTEM:  No gross neurological deficit. HEAD, NECK, EYE, ENT:  Negative. CHEST:  Symmetrical. HEART:  Regular sinus rhythm.  No murmur. ABDOMEN:  Soft, flat.  Liver, spleen, kidneys not palpable.  No CVA tenderness. PELVIC:  External genitalia is circumcised, meatus adequate.  Testicles are normal. RECTAL:  Normal sphincter tone.  No rectal mass.  Prostate  1.5+, smooth and firm.  IMPRESSION:  Trilobar hypertrophy of the prostate with bladder neck obstruction.  Recommend TUR of prostate.  NOTE:  The patient has significant symptoms of prostatism and also has trilobar hypertrophy, well-developed median lobe.  I do not think Flomax will be a good idea for long-term treatment.  I think TUR of prostate will be better in this situation.  I discussed these with the patient and his wife, gave him the option.  He elected to go ahead and do TUR of prostate for which he is coming as outpatient.  Procedure, limitation, and complication discussed.  They understand, want me to go ahead and proceed.     Ky Barban, M.D.     MIJ/MEDQ  D:  01/08/2012  T:  01/09/2012  Job:  [id number]

## 2012-01-09 NOTE — Anesthesia Procedure Notes (Signed)
Spinal  Patient location during procedure: OR Start time: 01/09/2012 12:32 PM Staffing CRNA/Resident: Glynn Octave Preanesthetic Checklist Completed: patient identified, site marked, surgical consent, pre-op evaluation, timeout performed, IV checked, risks and benefits discussed and monitors and equipment checked Spinal Block Patient position: left lateral decubitus Prep: Betadine Patient monitoring: heart rate, cardiac monitor, continuous pulse ox and blood pressure Approach: midline Location: L3-4 Injection technique: single-shot Needle Needle type: Spinocan  Needle gauge: 22 G Needle length: 9 cm Assessment Sensory level: T8 Additional Notes Tray # [id number],  Exp. 09/2012

## 2012-01-09 NOTE — Anesthesia Preprocedure Evaluation (Addendum)
Anesthesia Evaluation  Patient identified by MRN, date of birth, ID band Patient awake    Reviewed: Allergy & Precautions, H&P , NPO status , Patient's Chart, lab work & pertinent test results  Airway Mallampati: II      Dental  (+) Edentulous Upper and Edentulous Lower   Pulmonary Current Smoker,  clear to auscultation        Cardiovascular hypertension, Pt. on medications + CAD, + Past MI and + Cardiac Stents Regular Normal    Neuro/Psych    GI/Hepatic   Endo/Other    Renal/GU Renal disease: BPH.     Musculoskeletal   Abdominal   Peds  Hematology   Anesthesia Other Findings   Reproductive/Obstetrics                          Anesthesia Physical Anesthesia Plan  ASA: III  Anesthesia Plan: Spinal   Post-op Pain Management:    Induction:   Airway Management Planned: Nasal Cannula  Additional Equipment:   Intra-op Plan:   Post-operative Plan:   Informed Consent: I have reviewed the patients History and Physical, chart, labs and discussed the procedure including the risks, benefits and alternatives for the proposed anesthesia with the patient or authorized representative who has indicated his/her understanding and acceptance.     Plan Discussed with:   Anesthesia Plan Comments:         Anesthesia Quick Evaluation

## 2012-01-09 NOTE — Transfer of Care (Signed)
Immediate Anesthesia Transfer of Care Note  Patient: Ronald Mcdowell  Procedure(s) Performed: Procedure(s) (LRB): TRANSURETHRAL RESECTION OF THE PROSTATE (TURP) (N/A)  Patient Location: PACU  Anesthesia Type: Spinal  Level of Consciousness: awake, alert  and oriented  Airway & Oxygen Therapy: Patient Spontanous Breathing  Post-op Assessment: Report given to PACU RN  Post vital signs: Reviewed and stable  Complications: No apparent anesthesia complications

## 2012-01-09 NOTE — Anesthesia Postprocedure Evaluation (Signed)
  Anesthesia Post-op Note  Patient: Ronald Mcdowell  Procedure(s) Performed: Procedure(s) (LRB): TRANSURETHRAL RESECTION OF THE PROSTATE (TURP) (N/A)  Patient Location: PACU  Anesthesia Type: Spinal  Level of Consciousness: awake, alert  and oriented  Airway and Oxygen Therapy: Patient Spontanous Breathing  Post-op Pain: none  Post-op Assessment: Post-op Vital signs reviewed, Patient's Cardiovascular Status Stable, Respiratory Function Stable, Patent Airway and No signs of Nausea or vomiting  Post-op Vital Signs: Reviewed and stable  Complications: No apparent anesthesia complications

## 2012-01-09 NOTE — Brief Op Note (Signed)
01/09/2012  1:26 PM  PATIENT:  Ronald Mcdowell  66 y.o. male  PRE-OPERATIVE DIAGNOSIS:  bph  POST-OPERATIVE DIAGNOSIS:  bph  PROCEDURE:  Procedure(s) (LRB): TRANSURETHRAL RESECTION OF THE PROSTATE (TURP) (N/A)  SURGEON:  Surgeon(s) and Role:    * Ky Barban, MD - Primary  PHYSICIAN ASSISTANT:   ASSISTANTS: none   ANESTHESIA:spinal  EBL:  Total I/O In: 100 [I.V.:100] Out: -   BLOOD ADMINISTERED:none  DRAINS: Urinary Catheter (Foley)   LOCAL MEDICATIONS USED:  NONE  SPECIMEN:  Source of Specimen:  prostate chips  DISPOSITION OF SPECIMEN:  PATHOLOGY  COUNTS:  YES  TOURNIQUET:  * No tourniquets in log *  DICTATION: .Other Dictation: Dictation Number [id number]  PLAN OF CARE: Admit for overnight observation  PATIENT DISPOSITION:  PACU - hemodynamically stable.   Delay start of Pharmacological VTE agent (>24hrs) due to surgical blood loss or risk of bleeding:

## 2012-01-10 ENCOUNTER — Other Ambulatory Visit: Payer: Self-pay | Admitting: Urology

## 2012-01-10 LAB — BASIC METABOLIC PANEL
BUN: 6 mg/dL (ref 6–23)
CO2: 27 mEq/L (ref 19–32)
Glucose, Bld: 108 mg/dL — ABNORMAL HIGH (ref 70–99)
Potassium: 3.2 mEq/L — ABNORMAL LOW (ref 3.5–5.1)
Sodium: 137 mEq/L (ref 135–145)

## 2012-01-10 LAB — CBC
HCT: 41.4 % (ref 39.0–52.0)
Hemoglobin: 14.2 g/dL (ref 13.0–17.0)
MCH: 29.8 pg (ref 26.0–34.0)
MCV: 87 fL (ref 78.0–100.0)
RBC: 4.76 MIL/uL (ref 4.22–5.81)

## 2012-01-10 NOTE — Progress Notes (Signed)
Patient did very well overnight.  He complained of no pain and his CBI was continued and is now running yellow and clear.

## 2012-01-10 NOTE — Addendum Note (Signed)
Addendum  created 01/10/12 1345 by Despina Hidden, CRNA   Modules edited:Notes Section

## 2012-01-10 NOTE — Progress Notes (Signed)
Patient discharged home via family; Pt given and explained discharge instructions, prescriptions, carenotes; stated understanding and denied questions; pts IV removed without problems; foley home care instructions given with verbalized understandings (including maintaining connections, emptying standard drainage bag, maintaining drainage bag below the level of the bladder, call MD if hematuria worsens or large clots noticed in urine), pt encouraged to stay hydrated; pt stable at time of discharge

## 2012-01-10 NOTE — Anesthesia Postprocedure Evaluation (Signed)
  Anesthesia Post-op Note  Patient: Ronald Mcdowell  Procedure(s) Performed: Procedure(s) (LRB): TRANSURETHRAL RESECTION OF THE PROSTATE (TURP) (N/A)  Patient Location: room 335  Anesthesia Type: Spinal  Level of Consciousness: awake, alert , oriented, sedated and patient cooperative  Airway and Oxygen Therapy: Patient Spontanous Breathing  Post-op Pain: none  Post-op Assessment: Post-op Vital signs reviewed, Patient's Cardiovascular Status Stable, Respiratory Function Stable, Patent Airway, No signs of Nausea or vomiting and Pain level controlled  Post-op Vital Signs: Reviewed and stable  Complications: No apparent anesthesia complications

## 2012-01-10 NOTE — Progress Notes (Signed)
UR Chart Review Completed  

## 2012-01-10 NOTE — Op Note (Signed)
Ronald Mcdowell, Ronald Mcdowell                ACCOUNT NO.:  [id number]  MEDICAL RECORD NO.:  [id number]  LOCATION:  A335                          FACILITY:  [facility]  PHYSICIAN:  Ky Barban, M.D.DATE OF BIRTH:  19-Apr-1946  DATE OF PROCEDURE: DATE OF DISCHARGE:                              OPERATIVE REPORT   SURGEON:  Ky Barban, MD  PREOPERATIVE DIAGNOSIS:  Benign prostatic hypertrophy.  POSTOPERATIVE DIAGNOSIS:  Benign prostatic hypertrophy.  PROCEDURE:  TUR of prostate.  ANESTHESIA:  Spinal.  PROCEDURE:  The patient under spinal anesthesia in lithotomy position. After usual prep and drape, #28 Iglesias resectoscope was introduced into the bladder and once again, the bladder was inspected. Resectoscope was pulled back in mid prostatic urethra and median lobe was resected to the level of the mid prostatic urethra, then the bladder neck was circumferentially resected down to the circular fibers.  Now, the bleeders were coagulated.  Resectoscope was pulled back at the level of the verumontanum rotated to 11 o'clock position.  Resection of the right lobe was done between 11 and 7 o'clock position.  Similarly, the left lobe was resected between 1 and 5 o'clock position.  At the end, the posterior midline tissue along with the apical tissue was resected very carefully not to injure the sphincter or the verumontanum.  There was small amount of tissue in the anterior midline, which was resected at the end.  Prostatic urethra was wide open.  Chips were evacuated. Bleeders were coagulated.  Resectoscope was removed.  A #22 three-way Foley catheter was left for drainage.  CBI is clear.  The patient left the operating room in satisfactory condition.     Ky Barban, M.D.     MIJ/MEDQ  D:  01/09/2012  T:  01/10/2012  Job:  [id number]

## 2012-01-10 NOTE — Progress Notes (Unsigned)
Allergic Rhinitis Ronald Mcdowell is here for evaluation of possible allergic {rhinitis/ conjunctivitis:18878}. Patient's symptoms include {symptoms:18803}. These symptoms are {seasonality:18804}. Current triggers include exposure to {rhinitis triggers:14020}. The patient has been suffering from these symptoms for approximately {numbers 1-12:10294} {Time; days - years:10044}. The patient has tried {rhinitis treatments:18805} with {good/fair/poor:33178} relief of symptoms. Immunotherapy {immunotherapy:18806}. {nasal polyps:18809} {asthma history:18810} {eczema history:18812} {sinusitis history:18811} The patient {has/has not:18111} had sinus surgery in the past. Patient ID: [name], male   DOB: Mar 08, 1946, 66 y.o.   MRN: [id number]

## 2012-01-10 NOTE — Progress Notes (Unsigned)
Post op turp doing well cbi is clear will dc cbi and iv .oob and dc home with catheter. Will see on Monday to remove foley catheter.he can continue usual medications and let me know if bleeding or fever >101F.

## 2012-01-14 ENCOUNTER — Encounter (HOSPITAL_COMMUNITY): Payer: Self-pay | Admitting: Urology

## 2012-01-17 ENCOUNTER — Emergency Department (HOSPITAL_COMMUNITY)
Admission: EM | Admit: 2012-01-17 | Discharge: 2012-01-17 | Disposition: A | Discharge status: Home / Self Care | Payer: Commercial Managed Care - PPO | Attending: Emergency Medicine | Admitting: Emergency Medicine

## 2012-01-17 ENCOUNTER — Encounter (HOSPITAL_COMMUNITY): Payer: Self-pay | Admitting: *Deleted

## 2012-01-17 DIAGNOSIS — E7800 Pure hypercholesterolemia, unspecified: Secondary | ICD-10-CM | POA: Insufficient documentation

## 2012-01-17 DIAGNOSIS — I252 Old myocardial infarction: Secondary | ICD-10-CM | POA: Insufficient documentation

## 2012-01-17 DIAGNOSIS — R319 Hematuria, unspecified: Secondary | ICD-10-CM | POA: Insufficient documentation

## 2012-01-17 DIAGNOSIS — I2510 Atherosclerotic heart disease of native coronary artery without angina pectoris: Secondary | ICD-10-CM | POA: Insufficient documentation

## 2012-01-17 DIAGNOSIS — Z79899 Other long term (current) drug therapy: Secondary | ICD-10-CM | POA: Insufficient documentation

## 2012-01-17 DIAGNOSIS — N390 Urinary tract infection, site not specified: Secondary | ICD-10-CM | POA: Insufficient documentation

## 2012-01-17 DIAGNOSIS — I10 Essential (primary) hypertension: Secondary | ICD-10-CM | POA: Insufficient documentation

## 2012-01-17 LAB — BASIC METABOLIC PANEL
BUN: 8 mg/dL (ref 6–23)
CO2: 28 mEq/L (ref 19–32)
Chloride: 104 mEq/L (ref 96–112)
Creatinine, Ser: 0.85 mg/dL (ref 0.50–1.35)
Glucose, Bld: 90 mg/dL (ref 70–99)

## 2012-01-17 LAB — CBC
HCT: 39.5 % (ref 39.0–52.0)
MCV: 87.8 fL (ref 78.0–100.0)
RDW: 14.9 % (ref 11.5–15.5)
WBC: 9.2 10*3/uL (ref 4.0–10.5)

## 2012-01-17 LAB — URINALYSIS, ROUTINE W REFLEX MICROSCOPIC
Glucose, UA: 100 mg/dL — AB
pH: 6.5 (ref 5.0–8.0)

## 2012-01-17 LAB — URINE MICROSCOPIC-ADD ON

## 2012-01-17 MED ORDER — CIPROFLOXACIN HCL 250 MG PO TABS
500.0000 mg | ORAL_TABLET | Freq: Once | ORAL | Status: AC
  Administered 2012-01-17: 500 mg via ORAL
  Filled 2012-01-17: qty 2

## 2012-01-17 MED ORDER — CIPROFLOXACIN HCL 500 MG PO TABS: 500.0000 mg | ORAL_TABLET | Freq: Once | ORAL | Status: AC

## 2012-01-17 NOTE — ED Provider Notes (Signed)
This chart was scribed for Ronald Munch, MD by Wallis Mart. The patient was seen in room APA06/APA06 and the patient's care was started at 3:34 PM.   CSN: [id number]  Arrival date & time 01/17/12  1435   First MD Initiated Contact with Patient 01/17/12 1504      Chief Complaint  Patient presents with  . Hematuria    Patient is a 66 y.o. male presenting with hematuria.  Hematuria Irritative symptoms do not include frequency or urgency. Pertinent negatives include no abdominal pain, chills, dysuria or fever.   Ronald Mcdowell is a 66 y.o. male who presents to the Emergency Department complaining of sudden onset, intermittent, moderate hematuria following removal of catheter 4 days ago.   Pt had transurethral resection on Feb 14th and went home w/ catheter.   Pt c/o intermittent bleeding since Monday.  This morning when he used the bathroom pt saw no blood, but as the day progressed there was blood.  Pt denies any pain w/ urination, urinary frequency, fever, chills, appetite change, confusion.  There are no other associated symptoms and no other alleviating or aggravating factors.  Pt w/ h/o MI, hyperlipidemia, hypertension.    Urologist: DR Jerre Simon Past Medical History  Diagnosis Date  . Hypertension   . Elevated cholesterol   . Coronary artery disease     With history of MI in 2000  . Tobacco user   . Myocardial infarction 2000    Past Surgical History  Procedure Date  . Coronary stent placement 2000  . Cardiac catheterization   . Transurethral resection of prostate 01/09/2012    Procedure: TRANSURETHRAL RESECTION OF THE PROSTATE (TURP);  Surgeon: Ky Barban, MD;  Location: [facility];  Service: Urology;  Laterality: N/A;    Family History  Problem Relation Age of Onset  . Anesthesia problems Neg Hx   . Hypotension Neg Hx   . Malignant hyperthermia Neg Hx   . Pseudochol deficiency Neg Hx     History  Substance Use Topics  . Smoking status: Current Everyday  Smoker -- 1.5 packs/day for 40 years    Types: Cigarettes  . Smokeless tobacco: Never Used  . Alcohol Use: No      Review of Systems  Constitutional: Negative for fever, chills and appetite change.  HENT: Negative for congestion, sinus pressure and ear discharge.   Eyes: Negative for discharge.  Respiratory: Negative for cough and chest tightness.   Cardiovascular: Negative for chest pain and palpitations.  Gastrointestinal: Negative for abdominal pain and diarrhea.  Genitourinary: Positive for hematuria. Negative for dysuria, urgency and frequency.  Musculoskeletal: Negative for back pain.  Skin: Negative for rash.  Neurological: Negative for seizures and headaches.  Hematological: Negative.   Psychiatric/Behavioral: Negative for hallucinations.    Allergies  Review of patient's allergies indicates no known allergies.  Home Medications   Current Outpatient Rx  Name Route Sig Dispense Refill  . AMITRIPTYLINE HCL 10 MG PO TABS Oral Take 10 mg by mouth at bedtime as needed. For pain/sleep    . AMLODIPINE BESYLATE 10 MG PO TABS Oral Take 10 mg by mouth daily.      . ATORVASTATIN CALCIUM 40 MG PO TABS Oral Take 40 mg by mouth daily.    [name] BISOPROLOL FUMARATE 10 MG PO TABS Oral Take 10 mg by mouth daily.      [name] LISINOPRIL 20 MG PO TABS Oral Take 20 mg by mouth daily.      [name] PRAVASTATIN SODIUM 40  MG PO TABS Oral Take 40 mg by mouth every evening.    [name] NITROGLYCERIN 0.4 MG SL SUBL Sublingual Place 0.4 mg under the tongue every 5 (five) minutes as needed. May repeat for up to 3 doses.       BP 139/70  Pulse 65  Temp(Src) 97.5 F (36.4 C) (Oral)  Resp 20  Ht 5\' 5"  (1.651 m)  Wt 165 lb (74.844 kg)  BMI 27.46 kg/m2  SpO2 100%  Physical Exam  Nursing note and vitals reviewed. Constitutional: He is oriented to person, place, and time. He appears well-developed and well-nourished. No distress.  HENT:  Head: Normocephalic and atraumatic.  Eyes: EOM are normal. Pupils are equal,  round, and reactive to light.  Neck: Normal range of motion. Neck supple. No tracheal deviation present.  Cardiovascular: Normal rate, regular rhythm and normal heart sounds.   Pulmonary/Chest: Effort normal and breath sounds normal. No respiratory distress.  Abdominal: Soft. Bowel sounds are normal. He exhibits no distension.  Musculoskeletal: Normal range of motion. He exhibits no edema.  Neurological: He is alert and oriented to person, place, and time. No sensory deficit.  Skin: Skin is warm and dry.  Psychiatric: He has a normal mood and affect. His behavior is normal.    ED Course  Procedures (including critical care time) DIAGNOSTIC STUDIES: Oxygen Saturation is 100% on room air, normal by my interpretation.    COORDINATION OF CARE:     Labs Reviewed  URINALYSIS, ROUTINE W REFLEX MICROSCOPIC   No results found.   No diagnosis found.    MDM  This generally well male presents after a recent transurethral retrograde prostatectomy with hematuria, no pain.  On exam he is in no distress with unremarkable vital signs and he soft abdomen.  Patient's labs are consistent with a urinary tract infection.  The patient will receive antibiotics will be discharged to followup with his urologist.   Ronald Munch, MD 01/17/12 [phone]

## 2012-01-17 NOTE — ED Notes (Signed)
States he had a TURP ON THE 14th of this month, now is having intermittent hematuria

## 2012-01-17 NOTE — ED Notes (Signed)
Family at bedside. Patient is comfortable does not need anything at this time. 

## 2012-01-17 NOTE — ED Notes (Signed)
Pt c/o blood in his urine since this am. Pt states that he had surgery last week and they took his foley catheter out on Monday. Pt states that he had a little bleeding the next day. States that he got up this am and his urine was clear the first 2 times he urinated and then it started to get dark red again. Also c/o burning with urination.

## 2012-01-17 NOTE — Discharge Instructions (Signed)

## 2013-06-06 ENCOUNTER — Observation Stay (HOSPITAL_COMMUNITY)
Admission: EM | Admit: 2013-06-06 | Discharge: 2013-06-07 | Disposition: A | Discharge status: Home / Self Care | Payer: Commercial Managed Care - PPO | Attending: Internal Medicine | Admitting: Internal Medicine

## 2013-06-06 ENCOUNTER — Encounter (HOSPITAL_COMMUNITY): Payer: Self-pay

## 2013-06-06 ENCOUNTER — Emergency Department (HOSPITAL_COMMUNITY): Payer: Commercial Managed Care - PPO

## 2013-06-06 DIAGNOSIS — I2510 Atherosclerotic heart disease of native coronary artery without angina pectoris: Secondary | ICD-10-CM | POA: Insufficient documentation

## 2013-06-06 DIAGNOSIS — E785 Hyperlipidemia, unspecified: Secondary | ICD-10-CM | POA: Insufficient documentation

## 2013-06-06 DIAGNOSIS — E876 Hypokalemia: Secondary | ICD-10-CM | POA: Insufficient documentation

## 2013-06-06 DIAGNOSIS — R197 Diarrhea, unspecified: Secondary | ICD-10-CM

## 2013-06-06 DIAGNOSIS — I498 Other specified cardiac arrhythmias: Secondary | ICD-10-CM | POA: Insufficient documentation

## 2013-06-06 DIAGNOSIS — K859 Acute pancreatitis without necrosis or infection, unspecified: Secondary | ICD-10-CM

## 2013-06-06 DIAGNOSIS — I252 Old myocardial infarction: Secondary | ICD-10-CM

## 2013-06-06 DIAGNOSIS — R001 Bradycardia, unspecified: Secondary | ICD-10-CM

## 2013-06-06 DIAGNOSIS — E7800 Pure hypercholesterolemia, unspecified: Secondary | ICD-10-CM

## 2013-06-06 DIAGNOSIS — F17200 Nicotine dependence, unspecified, uncomplicated: Secondary | ICD-10-CM | POA: Insufficient documentation

## 2013-06-06 DIAGNOSIS — Z9861 Coronary angioplasty status: Secondary | ICD-10-CM | POA: Insufficient documentation

## 2013-06-06 DIAGNOSIS — I10 Essential (primary) hypertension: Secondary | ICD-10-CM | POA: Insufficient documentation

## 2013-06-06 DIAGNOSIS — Z79899 Other long term (current) drug therapy: Secondary | ICD-10-CM | POA: Insufficient documentation

## 2013-06-06 DIAGNOSIS — R079 Chest pain, unspecified: Principal | ICD-10-CM | POA: Insufficient documentation

## 2013-06-06 LAB — HEMOGLOBIN A1C
Hgb A1c MFr Bld: 5.5 % (ref <5.7)
Mean Plasma Glucose: 111 mg/dL (ref <117)

## 2013-06-06 LAB — LIPASE, BLOOD: Lipase: 77 U/L — ABNORMAL HIGH (ref 11–59)

## 2013-06-06 LAB — BASIC METABOLIC PANEL
BUN: 11 mg/dL (ref 6–23)
Chloride: 108 mEq/L (ref 96–112)
Glucose, Bld: 97 mg/dL (ref 70–99)
Potassium: 3.5 mEq/L (ref 3.5–5.1)

## 2013-06-06 LAB — CBC WITH DIFFERENTIAL/PLATELET
Hemoglobin: 14.7 g/dL (ref 13.0–17.0)
Lymphs Abs: 1.9 10*3/uL (ref 0.7–4.0)
MCH: 31.4 pg (ref 26.0–34.0)
Monocytes Relative: 6 % (ref 3–12)
Neutro Abs: 7.5 10*3/uL (ref 1.7–7.7)
Neutrophils Relative %: 73 % (ref 43–77)
RBC: 4.68 MIL/uL (ref 4.22–5.81)

## 2013-06-06 LAB — TROPONIN I: Troponin I: <0.3 ng/mL (ref <0.30)

## 2013-06-06 LAB — HEPATIC FUNCTION PANEL
Indirect Bilirubin: 0.4 mg/dL (ref 0.3–0.9)
Total Protein: 8 g/dL (ref 6.0–8.3)

## 2013-06-06 MED ORDER — ONDANSETRON HCL 4 MG/2ML IJ SOLN
4.0000 mg | Freq: Once | INTRAMUSCULAR | Status: AC
  Administered 2013-06-06: 4 mg via INTRAVENOUS
  Filled 2013-06-06: qty 2

## 2013-06-06 MED ORDER — ASPIRIN 81 MG PO CHEW
253.0000 mg | CHEWABLE_TABLET | Freq: Once | ORAL | Status: AC
  Administered 2013-06-06: 243 mg via ORAL
  Filled 2013-06-06: qty 3

## 2013-06-06 MED ORDER — PNEUMOCOCCAL VAC POLYVALENT 25 MCG/0.5ML IJ INJ
0.5000 mL | INJECTION | INTRAMUSCULAR | Status: DC
  Filled 2013-06-06: qty 0.5

## 2013-06-06 MED ORDER — AMLODIPINE BESYLATE 5 MG PO TABS
10.0000 mg | ORAL_TABLET | Freq: Every day | ORAL | Status: DC
  Administered 2013-06-07: 10 mg via ORAL
  Filled 2013-06-06: qty 2

## 2013-06-06 MED ORDER — ONDANSETRON HCL 4 MG PO TABS: 4.0000 mg | ORAL_TABLET | Freq: Four times a day (QID) | ORAL | Status: DC | PRN

## 2013-06-06 MED ORDER — BISOPROLOL FUMARATE 5 MG PO TABS
10.0000 mg | ORAL_TABLET | Freq: Every day | ORAL | Status: DC
  Filled 2013-06-06 (×4): qty 2

## 2013-06-06 MED ORDER — LISINOPRIL 10 MG PO TABS
10.0000 mg | ORAL_TABLET | Freq: Every day | ORAL | Status: DC
  Administered 2013-06-07: 10 mg via ORAL
  Filled 2013-06-06: qty 1

## 2013-06-06 MED ORDER — SIMVASTATIN 20 MG PO TABS
20.0000 mg | ORAL_TABLET | Freq: Every day | ORAL | Status: DC
  Administered 2013-06-06: 20 mg via ORAL
  Filled 2013-06-06: qty 1

## 2013-06-06 MED ORDER — SODIUM CHLORIDE 0.9 % IJ SOLN
3.0000 mL | Freq: Two times a day (BID) | INTRAMUSCULAR | Status: DC
  Administered 2013-06-06 – 2013-06-07 (×3): 3 mL via INTRAVENOUS

## 2013-06-06 MED ORDER — AMITRIPTYLINE HCL 10 MG PO TABS: 10.0000 mg | ORAL_TABLET | Freq: Every evening | ORAL | Status: DC | PRN

## 2013-06-06 MED ORDER — ONDANSETRON HCL 4 MG/2ML IJ SOLN: 4.0000 mg | Freq: Four times a day (QID) | INTRAMUSCULAR | Status: DC | PRN

## 2013-06-06 MED ORDER — MORPHINE SULFATE 4 MG/ML IJ SOLN
4.0000 mg | Freq: Once | INTRAMUSCULAR | Status: AC
  Administered 2013-06-06: 4 mg via INTRAVENOUS
  Filled 2013-06-06: qty 1

## 2013-06-06 MED ORDER — ASPIRIN EC 81 MG PO TBEC
81.0000 mg | DELAYED_RELEASE_TABLET | Freq: Every day | ORAL | Status: DC
  Administered 2013-06-07: 81 mg via ORAL
  Filled 2013-06-06: qty 1

## 2013-06-06 MED ORDER — HEPARIN SODIUM (PORCINE) 5000 UNIT/ML IJ SOLN
5000.0000 [IU] | Freq: Three times a day (TID) | INTRAMUSCULAR | Status: DC
  Administered 2013-06-06 – 2013-06-07 (×3): 5000 [IU] via SUBCUTANEOUS
  Filled 2013-06-06 (×3): qty 1

## 2013-06-06 MED ORDER — ASPIRIN 325 MG PO TABS
324.0000 mg | ORAL_TABLET | Freq: Once | ORAL | Status: DC
  Filled 2013-06-06: qty 1

## 2013-06-06 NOTE — ED Notes (Signed)
Report given to Sarah, RN unit 300. Ready to receive patient. 

## 2013-06-06 NOTE — ED Notes (Addendum)
Pt reports woke up this am around 0200 with  " slight" chest pain and had diarrhea.   Stomach was also hurting and legs felt weak.  Pt went back to bed,  woke up around 0530,  had diarrhea again and chest still tight.  Pt also c/o dizziness.   Pt says tightness is in left chest and radiates to left shoulder.  Denies n/v or SOB.

## 2013-06-06 NOTE — Progress Notes (Signed)
Pt came up to floor from ED. Pt is currently not in any pain/distress. Will continue to monitor.

## 2013-06-06 NOTE — ED Notes (Signed)
Pt reports had MI in 2000.  Reports symptoms were similar.
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
## 2013-06-06 NOTE — ED Provider Notes (Signed)
History    This chart was scribed for Ronald Gaskins, MD by Leone Payor, ED Scribe. This patient was seen in room APA06/APA06 and the patient's care was started 10:05 AM.  CSN: [id number] Arrival date & time 06/06/13  4098  First MD Initiated Contact with Patient 06/06/13 1003     No chief complaint on file.   Patient is a 67 y.o. male presenting with chest pain. The history is provided by the patient. No language interpreter was used.  Chest Pain Pain location:  Unable to specify Pain quality: tightness   Pain severity:  Mild Onset quality:  Gradual Duration:  9 hours Timing:  Constant Progression:  Unchanged Chronicity:  New Context: at rest   Relieved by:  Nothing Worsened by:  Nothing tried Ineffective treatments:  None tried Associated symptoms: abdominal pain, dizziness and weakness   Associated symptoms: no nausea, no shortness of breath and not vomiting   Risk factors: coronary artery disease     HPI Comments: Ronald Mcdowell is a 67 y.o. male who presents to the Emergency Department complaining of chest pain with nausea and diarrhea that started early this morning at 0200. Pt states he woke up and initially having watery diarrhea. States it was dark in color but he denies any blood. He denies h/o bleeding ulcers. He then noticed some chest pain described as tightness, no pleuritic pain reported. Pt went back to sleep but woke at 0530 with another episode of diarrhea and increased chest tightness. Pt has h/o MI in 2000. Denies chest pain in the last couple of weeks. He also had some associated abdominal pain, dizziness. He denies SOB, nausea, vomiting.   Past Medical History  Diagnosis Date  . Hypertension   . Elevated cholesterol   . Coronary artery disease     With history of MI in 2000  . Tobacco user   . Myocardial infarction 2000   Past Surgical History  Procedure Laterality Date  . Coronary stent placement  2000  . Cardiac catheterization    .  Transurethral resection of prostate  01/09/2012    Procedure: TRANSURETHRAL RESECTION OF THE PROSTATE (TURP);  Surgeon: Ky Barban, MD;  Location: [facility];  Service: Urology;  Laterality: N/A;   Family History  Problem Relation Age of Onset  . Anesthesia problems Neg Hx   . Hypotension Neg Hx   . Malignant hyperthermia Neg Hx   . Pseudochol deficiency Neg Hx    History  Substance Use Topics  . Smoking status: Current Every Day Smoker -- 1.50 packs/day for 40 years    Types: Cigarettes  . Smokeless tobacco: Never Used  . Alcohol Use: No    Review of Systems  Respiratory: Negative for shortness of breath.   Cardiovascular: Positive for chest pain.  Gastrointestinal: Positive for abdominal pain and diarrhea. Negative for nausea and vomiting.  Neurological: Positive for dizziness and weakness.  All other systems reviewed and are negative.    Allergies  Review of patient's allergies indicates no known allergies.  Home Medications   Current Outpatient Rx  Name  Route  Sig  Dispense  Refill  . amitriptyline (ELAVIL) 10 MG tablet   Oral   Take 10 mg by mouth at bedtime as needed. For pain/sleep         . amLODipine (NORVASC) 10 MG tablet   Oral   Take 10 mg by mouth daily.           [name] atorvastatin (LIPITOR)  40 MG tablet   Oral   Take 40 mg by mouth daily.         . bisoprolol (ZEBETA) 10 MG tablet   Oral   Take 10 mg by mouth daily.           [name] lisinopril (PRINIVIL,ZESTRIL) 20 MG tablet   Oral   Take 20 mg by mouth daily.           . nitroGLYCERIN (NITROSTAT) 0.4 MG SL tablet   Sublingual   Place 0.4 mg under the tongue every 5 (five) minutes as needed. May repeat for up to 3 doses.          . pravastatin (PRAVACHOL) 40 MG tablet   Oral   Take 40 mg by mouth every evening.          There were no vitals taken for this visit. Physical Exam  CONSTITUTIONAL: Well developed/well nourished HEAD: Normocephalic/atraumatic EYES: EOMI/PERRL ENMT:  Mucous membranes moist NECK: supple no meningeal signs SPINE:entire spine nontender CV: S1/S2 noted, no murmurs/rubs/gallops noted LUNGS: Lungs are clear to auscultation bilaterally, no apparent distress ABDOMEN: soft, nontender, no rebound or guarding GU:no cva tenderness. Rectal exam: stool color normal.  NEURO: Pt is awake/alert, moves all extremitiesx4 EXTREMITIES: pulses normal, full ROM SKIN: warm, color normal PSYCH: no abnormalities of mood noted  ED Course  Procedures (including critical care time)  DIAGNOSTIC STUDIES: Oxygen Saturation is 97% on RA, adequate by my interpretation.    COORDINATION OF CARE: 10:23 AM Discussed treatment plan with pt at bedside and pt agreed to plan.   12:08 PM Pt reports all of his symptoms resolved Given h/o MI with chest tightness, will admit for observation and ACS rule out Pt agreeable D/w dr Karilyn Cota to admit   MDM  Nursing notes including past medical history and social history reviewed and considered in documentation xrays reviewed and considered Labs/vital reviewed and considered     Date: 06/06/2013 10:04am  Rate: 61  Rhythm: normal sinus rhythm  QRS Axis: normal  Intervals: normal  ST/T Wave abnormalities: nonspecific ST changes  Conduction Disutrbances:none  Narrative Interpretation:   Old EKG Reviewed: changes noted     I personally performed the services described in this documentation, which was scribed in my presence. The recorded information has been reviewed and is accurate.         Ronald Gaskins, MD 06/06/13 [phone]

## 2013-06-06 NOTE — ED Notes (Signed)
Pt given cup of water 

## 2013-06-07 DIAGNOSIS — I2510 Atherosclerotic heart disease of native coronary artery without angina pectoris: Secondary | ICD-10-CM

## 2013-06-07 DIAGNOSIS — R001 Bradycardia, unspecified: Secondary | ICD-10-CM

## 2013-06-07 DIAGNOSIS — R079 Chest pain, unspecified: Secondary | ICD-10-CM

## 2013-06-07 DIAGNOSIS — I498 Other specified cardiac arrhythmias: Secondary | ICD-10-CM

## 2013-06-07 LAB — LIPID PANEL
HDL: 37 mg/dL — ABNORMAL LOW (ref >39)
LDL Cholesterol: 78 mg/dL (ref 0–99)
Total CHOL/HDL Ratio: 3.4 RATIO
Triglycerides: 62 mg/dL (ref <150)

## 2013-06-07 LAB — COMPREHENSIVE METABOLIC PANEL
ALT: 25 U/L (ref 0–53)
AST: 25 U/L (ref 0–37)
Albumin: 3.4 g/dL — ABNORMAL LOW (ref 3.5–5.2)
Alkaline Phosphatase: 44 U/L (ref 39–117)
Calcium: 8.6 mg/dL (ref 8.4–10.5)
Potassium: 3.2 mEq/L — ABNORMAL LOW (ref 3.5–5.1)
Sodium: 136 mEq/L (ref 135–145)
Total Protein: 7.1 g/dL (ref 6.0–8.3)

## 2013-06-07 LAB — TROPONIN I: Troponin I: <0.3 ng/mL (ref <0.30)

## 2013-06-07 MED ORDER — BISOPROLOL FUMARATE 10 MG PO TABS: 5.0000 mg | ORAL_TABLET | Freq: Every day | ORAL | Status: DC

## 2013-06-07 MED ORDER — ASPIRIN 81 MG PO TBEC: 81.0000 mg | DELAYED_RELEASE_TABLET | Freq: Every day | ORAL | Status: DC

## 2013-06-07 MED ORDER — BISOPROLOL FUMARATE 5 MG PO TABS
5.0000 mg | ORAL_TABLET | Freq: Every day | ORAL | Status: DC
  Administered 2013-06-07: 5 mg via ORAL
  Filled 2013-06-07 (×2): qty 1

## 2013-06-07 MED ORDER — POTASSIUM CHLORIDE CRYS ER 20 MEQ PO TBCR
40.0000 meq | EXTENDED_RELEASE_TABLET | Freq: Once | ORAL | Status: AC
  Administered 2013-06-07: 40 meq via ORAL
  Filled 2013-06-07: qty 1
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
## 2013-06-07 NOTE — Consult Note (Signed)
The patient was seen and examined, and I agree with the assessment and plan as documented above. Ronald Mcdowell has ruled out for an ACS with serial troponins. Given his history and ECG findings, I think a stress test with myocardial perfusion imaging would be prudent, but this can be done as an outpatient (patient's preference as well). Would reduce Bisoprolol given his resting bradycardia.

## 2013-06-07 NOTE — Progress Notes (Signed)
Pt discharged with instructions, prescriptions, and care notes.  He and his wife verbalized understanding.  The patient's wife stated that she would call here to clarify the stress test appointment and make the appointment for the f/u.  They were both eager to leave.  The patient left the floor ambulating with staff in stable condition.
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
## 2013-06-07 NOTE — Progress Notes (Signed)
Notified [name] NP with [facility] in regards to the patient's heart rate being in the 50's and 60 apically.  Voiced to her I had held this am dose of Zebeta because I was afraid it would lower his HR further.  New orders given and followed.
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
## 2013-06-08 ENCOUNTER — Other Ambulatory Visit: Payer: Self-pay | Admitting: *Deleted

## 2013-06-08 DIAGNOSIS — R079 Chest pain, unspecified: Secondary | ICD-10-CM

## 2013-06-10 ENCOUNTER — Encounter (HOSPITAL_COMMUNITY)
Admission: RE | Admit: 2013-06-10 | Discharge: 2013-06-10 | Disposition: A | Discharge status: Home / Self Care | Payer: Commercial Managed Care - PPO | Source: Ambulatory Visit | Attending: Cardiovascular Disease | Admitting: Cardiovascular Disease

## 2013-06-10 ENCOUNTER — Encounter (HOSPITAL_COMMUNITY): Payer: Self-pay

## 2013-06-10 DIAGNOSIS — R079 Chest pain, unspecified: Secondary | ICD-10-CM | POA: Insufficient documentation

## 2013-06-10 DIAGNOSIS — I10 Essential (primary) hypertension: Secondary | ICD-10-CM | POA: Insufficient documentation

## 2013-06-10 DIAGNOSIS — I2510 Atherosclerotic heart disease of native coronary artery without angina pectoris: Secondary | ICD-10-CM

## 2013-06-10 DIAGNOSIS — R001 Bradycardia, unspecified: Secondary | ICD-10-CM

## 2013-06-10 MED ORDER — TECHNETIUM TC 99M SESTAMIBI - CARDIOLITE
30.0000 | Freq: Once | INTRAVENOUS | Status: AC | PRN
  Administered 2013-06-10: 10:00:00 30 via INTRAVENOUS

## 2013-06-10 MED ORDER — TECHNETIUM TC 99M SESTAMIBI - CARDIOLITE
10.0000 | Freq: Once | INTRAVENOUS | Status: AC | PRN
  Administered 2013-06-10: 08:00:00 10 via INTRAVENOUS

## 2013-06-10 MED ORDER — REGADENOSON 0.4 MG/5ML IV SOLN
INTRAVENOUS | Status: AC
  Administered 2013-06-10: 0.4 mg via INTRAVENOUS
  Filled 2013-06-10: qty 5

## 2013-06-10 MED ORDER — SODIUM CHLORIDE 0.9 % IJ SOLN
INTRAMUSCULAR | Status: AC
  Administered 2013-06-10: 10 mL via INTRAVENOUS
  Filled 2013-06-10: qty 10

## 2013-06-10 NOTE — Progress Notes (Signed)
Stress Lab Nurses Notes - Ronald Mcdowell  Ronald Mcdowell 06/10/2013 Reason for doing test: CAD and Chest Pain Type of test: Test changed unable to reach THR on TM. Eugenie Birks given Nurse performing test: Parke Poisson, RN Nuclear Medicine Tech: Lyndel Pleasure Echo Tech: Not Applicable MD performing test: Ival Bible & [name] NP Family MD: Bluth Test explained and consent signed: yes IV started: 22g jelco, Saline lock flushed, No redness or edema and Saline lock started in radiology Symptoms: SOB Treatment/Intervention: None Reason test stopped: protocol completed After recovery IV was: Discontinued via X-ray tech and No redness or edema Patient to return to Nuc. Med at : 11:00 Patient discharged: Home Patient's Condition upon discharge was: stable Comments: During test peak BP 174/79 & HR 125.   Recovery BP 108/66 & HR 83.  Symptoms resolved in recovery. Erskine Speed T

## 2013-06-21 ENCOUNTER — Telehealth: Payer: Self-pay | Admitting: Cardiovascular Disease

## 2013-06-21 NOTE — Telephone Encounter (Signed)
Results of post hosp stress test / tgs

## 2013-06-21 NOTE — Telephone Encounter (Signed)
Spoke to pt family member who advised best contact number [phone] per pt in [place], called number provided and pt wife advised to contact pt cell number [phone], spoke to pt to advise results/instructions, pt requested that he wants Dr. Purvis Sheffield to be his cardiologist now per he will retire back to [employer] in October and he has not seen his [place] Card MD in 2 years, please advise management/next steps

## 2013-06-21 NOTE — Telephone Encounter (Signed)
His test shows evidence of a prior heart attack with mildly reduced heart function. Would have results forwarded to his cardiologist in [place], [place], so he can advise pt regarding management.

## 2013-06-21 NOTE — Telephone Encounter (Signed)
Please advise 

## 2013-06-22 NOTE — Telephone Encounter (Signed)
Please have pt f/u with me in clinic within next 8 weeks.

## 2013-06-22 NOTE — Telephone Encounter (Signed)
Pt has accepted apt for 08-19-13 with Dr. Purvis Sheffield at 9am Pt will call back to our office with any further concerns if necessary

## 2013-08-19 ENCOUNTER — Ambulatory Visit: Payer: Commercial Managed Care - PPO | Admitting: Cardiovascular Disease

## 2013-08-19 ENCOUNTER — Ambulatory Visit (INDEPENDENT_AMBULATORY_CARE_PROVIDER_SITE_OTHER): Payer: Commercial Managed Care - PPO | Admitting: Adult Health

## 2013-08-19 ENCOUNTER — Encounter: Payer: Self-pay | Admitting: Adult Health

## 2013-08-19 ENCOUNTER — Encounter: Payer: Self-pay | Admitting: *Deleted

## 2013-08-19 VITALS — BP 126/71 | HR 64 | Ht 65.0 in | Wt 157.2 lb

## 2013-08-19 DIAGNOSIS — Z01818 Encounter for other preprocedural examination: Secondary | ICD-10-CM

## 2013-08-19 DIAGNOSIS — I2510 Atherosclerotic heart disease of native coronary artery without angina pectoris: Secondary | ICD-10-CM

## 2013-08-19 DIAGNOSIS — I10 Essential (primary) hypertension: Secondary | ICD-10-CM

## 2013-08-19 DIAGNOSIS — R1311 Dysphagia, oral phase: Secondary | ICD-10-CM

## 2013-08-19 LAB — CBC WITH DIFFERENTIAL/PLATELET
Basophils Relative: 0 % (ref 0–1)
Eosinophils Absolute: 0.3 10*3/uL (ref 0.0–0.7)
MCH: 30.5 pg (ref 26.0–34.0)
MCHC: 35.3 g/dL (ref 30.0–36.0)
Neutrophils Relative %: 57 % (ref 43–77)
Platelets: 200 10*3/uL (ref 150–400)

## 2013-08-19 LAB — PROTIME-INR: Prothrombin Time: 13.7 seconds (ref 11.6–15.2)

## 2013-08-19 LAB — APTT: aPTT: 33 seconds (ref 24–37)

## 2013-08-19 NOTE — Patient Instructions (Addendum)
Your physician recommends that you schedule a follow-up appointment in: after cath  Your physician has requested that you have a cardiac catheterization. Cardiac catheterization is used to diagnose and/or treat various heart conditions. Doctors may recommend this procedure for a number of different reasons. The most common reason is to evaluate chest pain. Chest pain can be a symptom of coronary artery disease (CAD), and cardiac catheterization can show whether plaque is narrowing or blocking your heart's arteries. This procedure is also used to evaluate the valves, as well as measure the blood flow and oxygen levels in different parts of your heart. For further information please visit [email or web address] Please follow instruction sheet, as given.  You have been referred to Dr. Jena Gauss For GI issues.

## 2013-08-19 NOTE — Assessment & Plan Note (Signed)
The patient may benefit from GI evaluation and possible EGD. Or, possibly ENT evaluation. He is given phone number to doctors Dr's Kendell Bane and [place] office for further evaluation and or further testing.

## 2013-08-19 NOTE — Assessment & Plan Note (Signed)
Blood pressure is well controlled. He will continue on BB, ACE and amlodipine. More recommendations after cath.

## 2013-08-19 NOTE — Progress Notes (Deleted)
   HPI: [name] is a 67 year old former patient of Dr. Dietrich Pates we are following for ongoing assessment and management of CAD, with recent hospitalization in July 2000 4T with 2 episodes of chest tightness and pain. He has a history of stent and MI in 2000 with no prior records to establish specific areas of ischemia. He was ruled out for MI during that admission with followup stress Myoview planned. Stress test completed on 06/10/2013 was found to be abnormal intermediate risk study with equivocal ST segment changes and rare PVCs. Perfusion imaging was consistent with moderate to large area of inferior lateral scar in the mid to basal segment with no large ischemic territories.   No Known Allergies  Current Outpatient Prescriptions  Medication Sig Dispense Refill  . amitriptyline (ELAVIL) 10 MG tablet Take 10 mg by mouth at bedtime as needed for sleep. For pain/sleep      . amLODipine (NORVASC) 10 MG tablet Take 10 mg by mouth daily.        [name] aspirin EC 81 MG EC tablet Take 1 tablet (81 mg total) by mouth daily.  30 tablet  1  . bisoprolol (ZEBETA) 10 MG tablet Take 0.5 tablets (5 mg total) by mouth daily.      [name] lisinopril (PRINIVIL,ZESTRIL) 10 MG tablet Take 10 mg by mouth daily.      . pravastatin (PRAVACHOL) 40 MG tablet Take 40 mg by mouth every evening.       No current facility-administered medications for this visit.    Past Medical History  Diagnosis Date  . Hypertension   . Elevated cholesterol   . Coronary artery disease     With history of MI in 2000  . Tobacco user   . Myocardial infarction 2000    Past Surgical History  Procedure Laterality Date  . Coronary stent placement  2000  . Cardiac catheterization    . Transurethral resection of prostate  01/09/2012    Procedure: TRANSURETHRAL RESECTION OF THE PROSTATE (TURP);  Surgeon: Ky Barban, MD;  Location: [facility];  Service: Urology;  Laterality: N/A;    ROS: PHYSICAL EXAM There were no vitals taken for this  visit.  EKG:  ASSESSMENT AND PLAN

## 2013-08-19 NOTE — Progress Notes (Signed)
 HPI: [name] is a very pleasant 67-year-old male patient originally seen on consultation by Dr. Rothbart during hospitalization for chest discomfort with ongoing history of dyslipidemia, hypertension, CAD with stents to unknown artery in 2000 at hospital facility in [place], tobacco abuse with history of bradycardia. The patient was ruled out for myocardial infarction in July of 2014, but was scheduled for an outpatient stress Myoview with known history.   Stress Myoview was completed on 06/11/2003, demonstrating abnormal exercise Lexiscan Myoview, intermediate risk study. There were equivocal ST segment changes, with rare PVCs. There is no chest pain. Perfusion imaging was consistent with a moderate to large area of inferior lateral scar in the mid to basal segment no large ischemic. Worries were found however. There is mild LV dilatation with an LVEF of 41% in the inferior lateral hypokinesis. This would be consistent with underlying ischemic heart disease and cardiomyopathy with presumed prior inferior lateral infarction.    Since having stress test, the patient has had recurrent discomfort in his chest with exertion, i.e. mowing the lawn, where work where he does exert himself. He occasionally has some shortness of breath associated with this as well. His wife has been noticing this at home, the patient minimizes. He does admit that when he is having discomfort in his chest he will stop and the pain will go away. He is medically compliant.  No Known Allergies  Current Outpatient Prescriptions  Medication Sig Dispense Refill  . amitriptyline (ELAVIL) 10 MG tablet Take 10 mg by mouth at bedtime as needed for sleep. For pain/sleep      . amLODipine (NORVASC) 10 MG tablet Take 10 mg by mouth daily.        . aspirin EC 81 MG EC tablet Take 1 tablet (81 mg total) by mouth daily.  30 tablet  1  . atorvastatin (LIPITOR) 40 MG tablet Take 40 mg by mouth daily.       . bisoprolol (ZEBETA) 10 MG tablet  Take 0.5 tablets (5 mg total) by mouth daily.      . lisinopril (PRINIVIL,ZESTRIL) 10 MG tablet Take 10 mg by mouth daily.       No current facility-administered medications for this visit.    Past Medical History  Diagnosis Date  . Hypertension   . Elevated cholesterol   . Coronary artery disease     With history of MI in 2000  . Tobacco user   . Myocardial infarction 2000    Past Surgical History  Procedure Laterality Date  . Coronary stent placement  2000  . Cardiac catheterization    . Transurethral resection of prostate  01/09/2012    Procedure: TRANSURETHRAL RESECTION OF THE PROSTATE (TURP);  Surgeon: Mohammad I Javaid, MD;  Location: [facility];  Service: Urology;  Laterality: N/A;    Family History  Problem Relation Age of Onset  . Anesthesia problems Neg Hx   . Hypotension Neg Hx   . Malignant hyperthermia Neg Hx   . Pseudochol deficiency Neg Hx     History   Social History  . Marital Status: Married    Spouse Name: N/A    Number of Children: N/A  . Years of Education: N/A   Occupational History  . Not on file.   Social History Main Topics  . Smoking status: Current Every Day Smoker -- 1.00 packs/day for 40 years    Types: Cigarettes  . Smokeless tobacco: Never Used  . Alcohol Use: No  . Drug Use: No  .   Sexual Activity: Yes    Birth Control/ Protection: None   Other Topics Concern  . Not on file   Social History Narrative   Pt gets regular exercise.    ROS:Review of systems complete and found to be negative unless listed above   PHYSICAL EXAM BP 126/71  Pulse 64  Ht 5' 5" (1.651 m)  Wt 157 lb 4 oz (71.328 kg)  BMI 26.17 kg/m2  General: Well developed, well nourished, in no acute distress Head: Eyes PERRLA, No xanthomas.   Normal cephalic and atramatic  Lungs: Clear bilaterally to auscultation and percussion. Heart: HRRR S1 S2, without MRG.  Pulses are 2+ & equal.            Right carotid bruit. No JVD.  No abdominal bruits. No femoral  bruits. Abdomen: Bowel sounds are positive, abdomen soft and non-tender without masses or                  Hernia's noted. Msk:  Back normal, normal gait. Normal strength and tone for age. Extremities: No clubbing, cyanosis or edema.  DP +1 Neuro: Alert and oriented X 3. Psych:  Good affect, responds appropriately  EKG: (July of 2014) NSR with inferior lateral T-wave flattening.,   ASSESSMENT AND PLAN   

## 2013-08-19 NOTE — Assessment & Plan Note (Addendum)
The patient is having classic angina symptoms with known history of CAD and stent unknown artery with stress Myoview demonstrating inferior lateral scar. The patient is on 2 anti-angina medications include amlodipine and bisprolol, with ongoing exertional chest discomfort. The patient states is his energy level is less than as well. He initially attributed to his age, and his wife is concerned.    I reviewed the stress Myoview results with Dr. Dina Rich on site, discuss the need for cardiac catheterization for further evaluation of coronary anatomy and aggression of CAD. Dr. Wyline Mood has reviewed the chart and is in agreement that the patient would benefit from cardiac catheterization with typical angina symptoms. I discussed this with the patient and his wife who verbalized understanding and willing to proceed. Risks benefits and addition of procedure were presented.   He is planned for cardiac catheterization on Monday September 29th with Dr. [place].

## 2013-08-19 NOTE — Progress Notes (Deleted)
Name: Ronald Mcdowell    DOB: [birth year]  Age: 67 y.o.  MR#: [id number]       PCP:  Ernestine Conrad, MD      Insurance: Payor: Kathrene Bongo / Plan: ONENET PPO / Product Type: *No Product type* /   CC:    Chief Complaint  Patient presents with  . Coronary Artery Disease  . Bradycardia    VS Filed Vitals:   08/19/13 1400  BP: 126/71  Pulse: 64  Height: 5\' 5"  (1.651 m)  Weight: 157 lb 4 oz (71.328 kg)    Weights Current Weight  08/19/13 157 lb 4 oz (71.328 kg)  06/06/13 170 lb (77.111 kg)  01/17/12 165 lb (74.844 kg)    Blood Pressure  BP Readings from Last 3 Encounters:  08/19/13 126/71  06/07/13 135/77  01/17/12 141/73     Admit date:  (Not on file) Last encounter with RMR:  Visit date not found   Allergy Review of patient's allergies indicates no known allergies.  Current Outpatient Prescriptions  Medication Sig Dispense Refill  . amitriptyline (ELAVIL) 10 MG tablet Take 10 mg by mouth at bedtime as needed for sleep. For pain/sleep      . amLODipine (NORVASC) 10 MG tablet Take 10 mg by mouth daily.        [name] aspirin EC 81 MG EC tablet Take 1 tablet (81 mg total) by mouth daily.  30 tablet  1  . atorvastatin (LIPITOR) 40 MG tablet Take 40 mg by mouth daily.       . bisoprolol (ZEBETA) 10 MG tablet Take 0.5 tablets (5 mg total) by mouth daily.      [name] lisinopril (PRINIVIL,ZESTRIL) 10 MG tablet Take 10 mg by mouth daily.       No current facility-administered medications for this visit.    Discontinued Meds:    Medications Discontinued During This Encounter  Medication Reason  . pravastatin (PRAVACHOL) 40 MG tablet Error    Patient Active Problem List   Diagnosis Date Noted  . Bradycardia 06/07/2013  . Chest pain 06/06/2013  . DYSLIPIDEMIA 08/02/2010  . Essential hypertension, benign 04/13/2010  . DYSPHAGIA ORAL PHASE 04/13/2010  . PROTEINURIA 04/13/2010  . PURE HYPERCHOLESTEROLEMIA 01/31/2010  . NONDEPENDENT TOBACCO USE DISORDER 01/31/2010  . OLD MYOCARDIAL  INFARCTION 01/31/2010  . CORONARY ATHEROSCLEROSIS NATIVE CORONARY ARTERY 01/31/2010  . POSTSURG PERCUT TRANSLUMINAL COR ANGPLSTY STS 01/31/2010    LABS    Component Value Date/Time   NA 136 06/07/2013 0214   NA 141 06/06/2013 1050   NA 142 01/17/2012 1600   K 3.2* 06/07/2013 0214   K 3.5 06/06/2013 1050   K 3.6 01/17/2012 1600   CL 103 06/07/2013 0214   CL 108 06/06/2013 1050   CL 104 01/17/2012 1600   CO2 26 06/07/2013 0214   CO2 25 06/06/2013 1050   CO2 28 01/17/2012 1600   GLUCOSE 92 06/07/2013 0214   GLUCOSE 97 06/06/2013 1050   GLUCOSE 90 01/17/2012 1600   BUN 11 06/07/2013 0214   BUN 11 06/06/2013 1050   BUN 8 01/17/2012 1600   CREATININE 0.94 06/07/2013 0214   CREATININE 0.88 06/06/2013 1050   CREATININE 0.85 01/17/2012 1600   CALCIUM 8.6 06/07/2013 0214   CALCIUM 9.4 06/06/2013 1050   CALCIUM 9.2 01/17/2012 1600   GFRNONAA 86* 06/07/2013 0214   GFRNONAA 88* 06/06/2013 1050   GFRNONAA >90 01/17/2012 1600   GFRAA >90 06/07/2013 0214   GFRAA >90 06/06/2013 1050   GFRAA >90  01/17/2012 1600   CMP     Component Value Date/Time   NA 136 06/07/2013 0214   K 3.2* 06/07/2013 0214   CL 103 06/07/2013 0214   CO2 26 06/07/2013 0214   GLUCOSE 92 06/07/2013 0214   BUN 11 06/07/2013 0214   CREATININE 0.94 06/07/2013 0214   CALCIUM 8.6 06/07/2013 0214   PROT 7.1 06/07/2013 0214   ALBUMIN 3.4* 06/07/2013 0214   AST 25 06/07/2013 0214   ALT 25 06/07/2013 0214   ALKPHOS 44 06/07/2013 0214   BILITOT 0.6 06/07/2013 0214   GFRNONAA 86* 06/07/2013 0214   GFRAA >90 06/07/2013 0214       Component Value Date/Time   WBC 10.2 06/06/2013 1050   WBC 9.2 01/17/2012 1600   WBC 8.6 01/10/2012 0459   HGB 14.7 06/06/2013 1050   HGB 13.4 01/17/2012 1600   HGB 14.2 01/10/2012 0459   HCT 42.1 06/06/2013 1050   HCT 39.5 01/17/2012 1600   HCT 41.4 01/10/2012 0459   MCV 90.0 06/06/2013 1050   MCV 87.8 01/17/2012 1600   MCV 87.0 01/10/2012 0459    Lipid Panel     Component Value Date/Time   CHOL 127 06/07/2013 0214   TRIG 62  06/07/2013 0214   HDL 37* 06/07/2013 0214   CHOLHDL 3.4 06/07/2013 0214   VLDL 12 06/07/2013 0214   LDLCALC 78 06/07/2013 0214    ABG No results found for this basename: phart, pco2, pco2art, po2, po2art, hco3, tco2, acidbasedef, o2sat     Lab Results  Component Value Date   TSH 0.455 06/06/2013   BNP (last 3 results) No results found for this basename: PROBNP,  in the last 8760 hours Cardiac Panel (last 3 results) No results found for this basename: CKTOTAL, CKMB, TROPONINI, RELINDX,  in the last 72 hours  Iron/TIBC/Ferritin No results found for this basename: iron, tibc, ferritin     EKG Orders placed during the hospital encounter of 06/06/13  . EKG 12-LEAD  . EKG 12-LEAD  . ED EKG  . ED EKG  . EKG 12-LEAD  . EKG 12-LEAD  . EKG     Prior Assessment and Plan Problem List as of 08/19/2013   PURE HYPERCHOLESTEROLEMIA   DYSLIPIDEMIA   NONDEPENDENT TOBACCO USE DISORDER   Essential hypertension, benign   OLD MYOCARDIAL INFARCTION   CORONARY ATHEROSCLEROSIS NATIVE CORONARY ARTERY   DYSPHAGIA ORAL PHASE   PROTEINURIA   POSTSURG PERCUT TRANSLUMINAL COR ANGPLSTY STS   Chest pain   Bradycardia       Imaging: No results found.

## 2013-08-20 ENCOUNTER — Encounter: Payer: Self-pay | Admitting: *Deleted

## 2013-08-20 ENCOUNTER — Encounter (HOSPITAL_COMMUNITY): Payer: Self-pay | Admitting: Pharmacy Technician

## 2013-08-23 ENCOUNTER — Ambulatory Visit (HOSPITAL_COMMUNITY)
Admission: RE | Admit: 2013-08-23 | Discharge: 2013-08-24 | Disposition: A | Discharge status: Home / Self Care | Payer: Commercial Managed Care - PPO | Source: Ambulatory Visit | Attending: Cardiology | Admitting: Cardiology

## 2013-08-23 ENCOUNTER — Encounter (HOSPITAL_COMMUNITY)
Admission: RE | Disposition: A | Discharge status: Home / Self Care | Payer: Self-pay | Source: Ambulatory Visit | Attending: Cardiology

## 2013-08-23 ENCOUNTER — Other Ambulatory Visit: Payer: Self-pay

## 2013-08-23 ENCOUNTER — Encounter (HOSPITAL_COMMUNITY): Payer: Self-pay | Admitting: General Practice

## 2013-08-23 DIAGNOSIS — I2589 Other forms of chronic ischemic heart disease: Secondary | ICD-10-CM | POA: Insufficient documentation

## 2013-08-23 DIAGNOSIS — Z720 Tobacco use: Secondary | ICD-10-CM | POA: Diagnosis present

## 2013-08-23 DIAGNOSIS — F17200 Nicotine dependence, unspecified, uncomplicated: Secondary | ICD-10-CM | POA: Diagnosis present

## 2013-08-23 DIAGNOSIS — I2510 Atherosclerotic heart disease of native coronary artery without angina pectoris: Secondary | ICD-10-CM

## 2013-08-23 DIAGNOSIS — I25119 Atherosclerotic heart disease of native coronary artery with unspecified angina pectoris: Secondary | ICD-10-CM | POA: Diagnosis present

## 2013-08-23 DIAGNOSIS — I255 Ischemic cardiomyopathy: Secondary | ICD-10-CM | POA: Diagnosis present

## 2013-08-23 DIAGNOSIS — R079 Chest pain, unspecified: Secondary | ICD-10-CM

## 2013-08-23 DIAGNOSIS — Z01818 Encounter for other preprocedural examination: Secondary | ICD-10-CM

## 2013-08-23 DIAGNOSIS — I200 Unstable angina: Secondary | ICD-10-CM

## 2013-08-23 DIAGNOSIS — R1311 Dysphagia, oral phase: Secondary | ICD-10-CM

## 2013-08-23 DIAGNOSIS — I10 Essential (primary) hypertension: Secondary | ICD-10-CM | POA: Insufficient documentation

## 2013-08-23 DIAGNOSIS — E785 Hyperlipidemia, unspecified: Secondary | ICD-10-CM | POA: Insufficient documentation

## 2013-08-23 HISTORY — PX: LEFT HEART CATHETERIZATION WITH CORONARY ANGIOGRAM: SHX5451

## 2013-08-23 HISTORY — PX: CORONARY ANGIOPLASTY WITH STENT PLACEMENT: SHX49

## 2013-08-23 HISTORY — DX: Ischemic cardiomyopathy: I25.5

## 2013-08-23 LAB — POCT ACTIVATED CLOTTING TIME: Activated Clotting Time: 278 seconds

## 2013-08-23 LAB — BASIC METABOLIC PANEL
Calcium: 9.1 mg/dL (ref 8.4–10.5)
Creatinine, Ser: 0.76 mg/dL (ref 0.50–1.35)
GFR calc non Af Amer: >90 mL/min (ref >90)
Glucose, Bld: 95 mg/dL (ref 70–99)
Sodium: 140 mEq/L (ref 135–145)

## 2013-08-23 SURGERY — LEFT HEART CATHETERIZATION WITH CORONARY ANGIOGRAM
Anesthesia: LOCAL

## 2013-08-23 MED ORDER — SODIUM CHLORIDE 0.9 % IV SOLN: 1.0000 mL/kg/h | INTRAVENOUS | Status: DC

## 2013-08-23 MED ORDER — INFLUENZA VAC SPLIT QUAD 0.5 ML IM SUSP
0.5000 mL | INTRAMUSCULAR | Status: AC
  Administered 2013-08-24: 0.5 mL via INTRAMUSCULAR
  Filled 2013-08-23: qty 0.5

## 2013-08-23 MED ORDER — ASPIRIN 81 MG PO CHEW
324.0000 mg | CHEWABLE_TABLET | ORAL | Status: AC
  Administered 2013-08-23: 324 mg via ORAL

## 2013-08-23 MED ORDER — BISOPROLOL FUMARATE 5 MG PO TABS
5.0000 mg | ORAL_TABLET | Freq: Every day | ORAL | Status: DC
  Filled 2013-08-23: qty 1

## 2013-08-23 MED ORDER — POTASSIUM CHLORIDE CRYS ER 20 MEQ PO TBCR
40.0000 meq | EXTENDED_RELEASE_TABLET | Freq: Once | ORAL | Status: AC
  Administered 2013-08-23: 40 meq via ORAL
  Filled 2013-08-23: qty 2

## 2013-08-23 MED ORDER — PRASUGREL HCL 10 MG PO TABS: 10.0000 mg | ORAL_TABLET | Freq: Every day | ORAL | Status: DC

## 2013-08-23 MED ORDER — HEPARIN SODIUM (PORCINE) 1000 UNIT/ML IJ SOLN
INTRAMUSCULAR | Status: AC
  Filled 2013-08-23: qty 1

## 2013-08-23 MED ORDER — HEPARIN (PORCINE) IN NACL 2-0.9 UNIT/ML-% IJ SOLN
INTRAMUSCULAR | Status: AC
  Filled 2013-08-23: qty 1000

## 2013-08-23 MED ORDER — MIDAZOLAM HCL 2 MG/2ML IJ SOLN
INTRAMUSCULAR | Status: AC
  Filled 2013-08-23: qty 2

## 2013-08-23 MED ORDER — POTASSIUM CHLORIDE CRYS ER 20 MEQ PO TBCR
EXTENDED_RELEASE_TABLET | ORAL | Status: AC
  Filled 2013-08-23: qty 2

## 2013-08-23 MED ORDER — NITROGLYCERIN 0.2 MG/ML ON CALL CATH LAB
INTRAVENOUS | Status: AC
  Filled 2013-08-23: qty 1

## 2013-08-23 MED ORDER — PRASUGREL HCL 10 MG PO TABS
ORAL_TABLET | ORAL | Status: AC
  Filled 2013-08-23: qty 6

## 2013-08-23 MED ORDER — FENTANYL CITRATE 0.05 MG/ML IJ SOLN
INTRAMUSCULAR | Status: AC
  Filled 2013-08-23: qty 2

## 2013-08-23 MED ORDER — SODIUM CHLORIDE 0.9 % IV SOLN: 1.0000 mL/kg/h | INTRAVENOUS | Status: AC

## 2013-08-23 MED ORDER — LIDOCAINE HCL (PF) 1 % IJ SOLN
INTRAMUSCULAR | Status: AC
  Filled 2013-08-23: qty 30

## 2013-08-23 MED ORDER — ONDANSETRON HCL 4 MG/2ML IJ SOLN: 4.0000 mg | Freq: Four times a day (QID) | INTRAMUSCULAR | Status: DC | PRN

## 2013-08-23 MED ORDER — ALUM & MAG HYDROXIDE-SIMETH 200-200-20 MG/5ML PO SUSP
30.0000 mL | Freq: Once | ORAL | Status: AC
  Administered 2013-08-23: 13:00:00 30 mL via ORAL
  Filled 2013-08-23: qty 30

## 2013-08-23 MED ORDER — VERAPAMIL HCL 2.5 MG/ML IV SOLN
INTRAVENOUS | Status: AC
  Filled 2013-08-23: qty 2

## 2013-08-23 MED ORDER — POTASSIUM CHLORIDE 20 MEQ/15ML (10%) PO LIQD
ORAL | Status: AC
  Filled 2013-08-23: qty 30

## 2013-08-23 MED ORDER — SODIUM CHLORIDE 0.9 % IJ SOLN: 3.0000 mL | INTRAMUSCULAR | Status: DC | PRN

## 2013-08-23 MED ORDER — ACETAMINOPHEN 325 MG PO TABS: 650.0000 mg | ORAL_TABLET | ORAL | Status: DC | PRN

## 2013-08-23 MED ORDER — SODIUM CHLORIDE 0.9 % IJ SOLN: 3.0000 mL | Freq: Two times a day (BID) | INTRAMUSCULAR | Status: DC

## 2013-08-23 MED ORDER — LISINOPRIL 10 MG PO TABS
10.0000 mg | ORAL_TABLET | Freq: Every morning | ORAL | Status: DC
  Filled 2013-08-23: qty 1

## 2013-08-23 MED ORDER — ATORVASTATIN CALCIUM 40 MG PO TABS
40.0000 mg | ORAL_TABLET | Freq: Every day | ORAL | Status: DC
  Administered 2013-08-23: 40 mg via ORAL
  Filled 2013-08-23 (×3): qty 1

## 2013-08-23 MED ORDER — AMLODIPINE BESYLATE 10 MG PO TABS
10.0000 mg | ORAL_TABLET | Freq: Every day | ORAL | Status: DC
  Filled 2013-08-23: qty 1

## 2013-08-23 MED ORDER — PRASUGREL HCL 10 MG PO TABS
10.0000 mg | ORAL_TABLET | Freq: Every day | ORAL | Status: DC
  Filled 2013-08-23: qty 1

## 2013-08-23 MED ORDER — SODIUM CHLORIDE 0.9 % IV SOLN
250.0000 mL | INTRAVENOUS | Status: DC | PRN
Start: 2013-08-23 — End: 2013-08-23

## 2013-08-23 MED ORDER — ASPIRIN EC 81 MG PO TBEC
81.0000 mg | DELAYED_RELEASE_TABLET | Freq: Every day | ORAL | Status: DC
  Filled 2013-08-23: qty 1

## 2013-08-23 MED ORDER — SODIUM CHLORIDE 0.9 % IV SOLN
INTRAVENOUS | Status: DC
  Administered 2013-08-23: 06:00:00 via INTRAVENOUS

## 2013-08-23 MED ORDER — ASPIRIN 81 MG PO CHEW
CHEWABLE_TABLET | ORAL | Status: AC
  Filled 2013-08-23: qty 4

## 2013-08-23 NOTE — Progress Notes (Signed)
TR BAND REMOVAL  LOCATION:    right radial  DEFLATED PER PROTOCOL:    yes  TIME BAND OFF / DRESSING APPLIED:    1230   SITE UPON ARRIVAL:    Level 0  SITE AFTER BAND REMOVAL:    Level 0  REVERSE ALLEN'S TEST:     positive  CIRCULATION SENSATION AND MOVEMENT:    Within Normal Limits   yes  COMMENTS:   Tolerated procedure well 

## 2013-08-23 NOTE — Interval H&P Note (Signed)
History and Physical Interval Note:  08/23/2013 7:49 AM  Ronald Mcdowell  has presented today for surgery, with the diagnosis of Chest pain  The various methods of treatment have been discussed with the patient and family. After consideration of risks, benefits and other options for treatment, the patient has consented to  Procedure(s): LEFT HEART CATHETERIZATION WITH CORONARY ANGIOGRAM (N/A) as a surgical intervention .  The patient's history has been reviewed, patient examined, no change in status, stable for surgery.  I have reviewed the patient's chart and labs.  Questions were answered to the patient's satisfaction.   Cath Lab Visit (complete for each Cath Lab visit)  Clinical Evaluation Leading to the Procedure:   ACS: no  Non-ACS:    Anginal Classification: CCS II  Anti-ischemic medical therapy: Maximal Therapy (2 or more classes of medications)  Non-Invasive Test Results: Intermediate-risk stress test findings: cardiac mortality 1-3%/year  Prior CABG: No previous CABG        Theron Arista [facility] 08/23/2013 7:49 AM

## 2013-08-23 NOTE — CV Procedure (Signed)
   Cardiac Catheterization Procedure Note  Name: Ronald Mcdowell MRN: [id number] DOB: 09-03-1946  Procedure: Left Heart Cath, Selective Coronary Angiography, LV angiography, PTCA and stenting of the Left circumflex artery.  Indication: 67 yo BM with history of CAD s/p stenting of the Left circumflex artery in 2000. He had an intermediate risk myoview study in July 2014 with a large inferolateral scar and EF of 41%. He presents with class II exertional angina despite optimal medical therapy.  Procedural Details:  The right wrist was prepped, draped, and anesthetized with 1% lidocaine. Using the modified Seldinger technique, a 5 French sheath was introduced into the right radial artery. 3 mg of verapamil was administered through the sheath, weight-based unfractionated heparin was administered intravenously. Standard Judkins catheters were used for selective coronary angiography and left ventriculography. Catheter exchanges were performed over an exchange length guidewire.  PROCEDURAL FINDINGS Hemodynamics: AO 134/64 mean 91 mm Hg LV 139/10 mm Hg   Coronary angiography: Coronary dominance: right  Left mainstem: Normal  Left anterior descending (LAD): Mild diffuse irregularities less than 10-20%. The first diagonal has 40-50% disease proximally.  Left circumflex (LCx): The left circumflex gives rise to a very small OM1 then a large OM2. There is a stent in the mid LCx. At the distal margin of the stent there is a 95-99% stenosis. The OM2 bifurcates distally. The more superior branch has segmental 90% stenosis. This branch is less than 1 mm in diameter.  Right coronary artery (RCA): The RCA is a dominant vessel. It is diffusely diseased. There is a 40% stenosis in the proximal vessel followed by a long 80% stenosis in the proximal to mid vessel. The distal vessel is diffusely diseased to 70%.  Left ventriculography: Left ventricular systolic function is abnormal, LVEF is estimated at 35-40%,  there is inferior akinesis with global hypokinesis, there is no significant mitral regurgitation   PCI Note:  Following the diagnostic procedure, the decision was made to proceed with PCI. The radial sheath was upsized to a 6 [place]. Weight-based heparin was given for anticoagulation. Effient 60 mg was given orally. Once a therapeutic ACT was achieved, a 6 [place] XBLAD 3.5 guide catheter was inserted.  A prowater coronary guidewire was used to cross the lesion.  The lesion was predilated with a 2.0 mm balloon.  The lesion was then stented with a 2.5 x 20 mm Promus premier stent.  The stent was postdilated with a 2.75 mm noncompliant balloon.  Following PCI, there was 0% residual stenosis and TIMI-3 flow. Final angiography confirmed an excellent result. The patient tolerated the procedure well. There were no immediate procedural complications. A TR band was used for radial hemostasis. The patient was transferred to the post catheterization recovery area for further monitoring.  PCI Data: Vessel - LCx/Segment - mid Percent Stenosis (pre)  95-99% TIMI-flow 3 Stent 2.5 x 20 mm Promus premier Percent Stenosis (post) 0% TIMI-flow (post) 3  Final Conclusions:   1. Two vessel obstructive CAD 2. Moderate LV dysfunction. 3. Successful stenting of the mid LCx with a DES.   Recommendations:  Dual antiplatelet therapy for one year. If patient has refractory symptoms will consider PCI of the RCA. This vessel would require extensive stenting.   Theron Arista [facility] 08/23/2013, 8:50 AM

## 2013-08-23 NOTE — H&P (View-Only) (Signed)
HPI: Ronald Mcdowell is a very pleasant 67 year old male patient originally seen on consultation by Dr. Dietrich Pates during hospitalization for chest discomfort with ongoing history of dyslipidemia, hypertension, CAD with stents to unknown artery in 2000 at hospital facility in [place], tobacco abuse with history of bradycardia. The patient was ruled out for myocardial infarction in July of 2014, but was scheduled for an outpatient stress Myoview with known history.   Stress Myoview was completed on 06/11/2003, demonstrating abnormal exercise Lexiscan Myoview, intermediate risk study. There were equivocal ST segment changes, with rare PVCs. There is no chest pain. Perfusion imaging was consistent with a moderate to large area of inferior lateral scar in the mid to basal segment no large ischemic. Worries were found however. There is mild LV dilatation with an LVEF of 41% in the inferior lateral hypokinesis. This would be consistent with underlying ischemic heart disease and cardiomyopathy with presumed prior inferior lateral infarction.    Since having stress test, the patient has had recurrent discomfort in his chest with exertion, i.e. mowing the lawn, where work where he does exert himself. He occasionally has some shortness of breath associated with this as well. His wife has been noticing this at home, the patient minimizes. He does admit that when he is having discomfort in his chest he will stop and the pain will go away. He is medically compliant.  No Known Allergies  Current Outpatient Prescriptions  Medication Sig Dispense Refill  . amitriptyline (ELAVIL) 10 MG tablet Take 10 mg by mouth at bedtime as needed for sleep. For pain/sleep      . amLODipine (NORVASC) 10 MG tablet Take 10 mg by mouth daily.        [name] aspirin EC 81 MG EC tablet Take 1 tablet (81 mg total) by mouth daily.  30 tablet  1  . atorvastatin (LIPITOR) 40 MG tablet Take 40 mg by mouth daily.       . bisoprolol (ZEBETA) 10 MG tablet  Take 0.5 tablets (5 mg total) by mouth daily.      [name] lisinopril (PRINIVIL,ZESTRIL) 10 MG tablet Take 10 mg by mouth daily.       No current facility-administered medications for this visit.    Past Medical History  Diagnosis Date  . Hypertension   . Elevated cholesterol   . Coronary artery disease     With history of MI in 2000  . Tobacco user   . Myocardial infarction 2000    Past Surgical History  Procedure Laterality Date  . Coronary stent placement  2000  . Cardiac catheterization    . Transurethral resection of prostate  01/09/2012    Procedure: TRANSURETHRAL RESECTION OF THE PROSTATE (TURP);  Surgeon: Ky Barban, MD;  Location: [facility];  Service: Urology;  Laterality: N/A;    Family History  Problem Relation Age of Onset  . Anesthesia problems Neg Hx   . Hypotension Neg Hx   . Malignant hyperthermia Neg Hx   . Pseudochol deficiency Neg Hx     History   Social History  . Marital Status: Married    Spouse Name: N/A    Number of Children: N/A  . Years of Education: N/A   Occupational History  . Not on file.   Social History Main Topics  . Smoking status: Current Every Day Smoker -- 1.00 packs/day for 40 years    Types: Cigarettes  . Smokeless tobacco: Never Used  . Alcohol Use: No  . Drug Use: No  .  Sexual Activity: Yes    Birth Control/ Protection: None   Other Topics Concern  . Not on file   Social History Narrative   Pt gets regular exercise.    ZOX:WRUEAV of systems complete and found to be negative unless listed above   PHYSICAL EXAM BP 126/71  Pulse 64  Ht 5\' 5"  (1.651 m)  Wt 157 lb 4 oz (71.328 kg)  BMI 26.17 kg/m2  General: Well developed, well nourished, in no acute distress Head: Eyes PERRLA, No xanthomas.   Normal cephalic and atramatic  Lungs: Clear bilaterally to auscultation and percussion. Heart: HRRR S1 S2, without MRG.  Pulses are 2+ & equal.            Right carotid bruit. No JVD.  No abdominal bruits. No femoral  bruits. Abdomen: Bowel sounds are positive, abdomen soft and non-tender without masses or                  Hernia's noted. Msk:  Back normal, normal gait. Normal strength and tone for age. Extremities: No clubbing, cyanosis or edema.  DP +1 Neuro: Alert and oriented X 3. Psych:  Good affect, responds appropriately  EKG: (July of 2014) NSR with inferior lateral T-wave flattening.,   ASSESSMENT AND PLAN

## 2013-08-24 ENCOUNTER — Encounter (HOSPITAL_COMMUNITY): Payer: Self-pay | Admitting: Nurse Practitioner

## 2013-08-24 DIAGNOSIS — I25119 Atherosclerotic heart disease of native coronary artery with unspecified angina pectoris: Secondary | ICD-10-CM | POA: Diagnosis present

## 2013-08-24 DIAGNOSIS — I2589 Other forms of chronic ischemic heart disease: Secondary | ICD-10-CM

## 2013-08-24 DIAGNOSIS — I10 Essential (primary) hypertension: Secondary | ICD-10-CM | POA: Insufficient documentation

## 2013-08-24 DIAGNOSIS — Z720 Tobacco use: Secondary | ICD-10-CM | POA: Diagnosis present

## 2013-08-24 DIAGNOSIS — F17200 Nicotine dependence, unspecified, uncomplicated: Secondary | ICD-10-CM | POA: Diagnosis present

## 2013-08-24 DIAGNOSIS — I2510 Atherosclerotic heart disease of native coronary artery without angina pectoris: Secondary | ICD-10-CM

## 2013-08-24 DIAGNOSIS — I255 Ischemic cardiomyopathy: Secondary | ICD-10-CM | POA: Diagnosis present

## 2013-08-24 DIAGNOSIS — R079 Chest pain, unspecified: Secondary | ICD-10-CM

## 2013-08-24 DIAGNOSIS — E785 Hyperlipidemia, unspecified: Secondary | ICD-10-CM

## 2013-08-24 DIAGNOSIS — I200 Unstable angina: Secondary | ICD-10-CM

## 2013-08-24 LAB — CBC
HCT: 41.6 % (ref 39.0–52.0)
Hemoglobin: 14.9 g/dL (ref 13.0–17.0)
MCH: 30.7 pg (ref 26.0–34.0)
MCHC: 35.8 g/dL (ref 30.0–36.0)
MCV: 85.8 fL (ref 78.0–100.0)
Platelets: 185 K/uL (ref 150–400)
RBC: 4.85 MIL/uL (ref 4.22–5.81)
RDW: 14.8 % (ref 11.5–15.5)
WBC: 8.5 K/uL (ref 4.0–10.5)

## 2013-08-24 LAB — BASIC METABOLIC PANEL WITH GFR
BUN: 10 mg/dL (ref 6–23)
CO2: 25 meq/L (ref 19–32)
Calcium: 8.8 mg/dL (ref 8.4–10.5)
Chloride: 105 meq/L (ref 96–112)
Creatinine, Ser: 0.82 mg/dL (ref 0.50–1.35)
GFR calc Af Amer: >90 mL/min
GFR calc non Af Amer: >90 mL/min
Glucose, Bld: 86 mg/dL (ref 70–99)
Potassium: 3.6 meq/L (ref 3.5–5.1)
Sodium: 140 meq/L (ref 135–145)

## 2013-08-24 MED ORDER — PRASUGREL HCL 10 MG PO TABS: 10.0000 mg | ORAL_TABLET | Freq: Every day | ORAL | Status: DC

## 2013-08-24 MED ORDER — NITROGLYCERIN 0.4 MG SL SUBL: 0.4000 mg | SUBLINGUAL_TABLET | SUBLINGUAL | Status: DC | PRN

## 2013-08-24 NOTE — Progress Notes (Signed)
CARDIAC REHAB PHASE I   PRE:  Rate/Rhythm: 57SB  BP:  Supine: 153/78  Sitting:   Standing:    SaO2: 100%RA  MODE:  Ambulation: 1000 ft   POST:  Rate/Rhythm: 64SR  BP:  Supine:   Sitting: 150/84  Standing:    SaO2: 99%RA 0810-0900 Pt walked 1000 ft with steady gait. Tolerated well. No CP. Education completed with pt and wife. Understanding voiced. Did not go over ex ed or Phase 2 as pt states he will have staged PCI in 2 weeks. Refer to cardiology for activity. We will follow up and complete ed at staged PCI. Discussed smoking cessation and gave handouts. Pt has quit cold [place] before and plans to do that this time.   Luetta Nutting, RN BSN  08/24/2013 8:55 AM

## 2013-08-24 NOTE — Care Management Note (Signed)
    Page 1 of 1   08/24/2013     2:31:29 PM   CARE MANAGEMENT NOTE 08/24/2013  Patient:  Ronald Mcdowell, Ronald Mcdowell   Account Number:  [id number]  Date Initiated:  08/24/2013  Documentation initiated by:  [facility]  Subjective/Objective Assessment:   67 yo male admitted with chest pain//home with spouse     Action/Plan:   cardiac cath//return home with spouse; benefits check.   Anticipated DC Date:  08/24/2013   Anticipated DC Plan:  HOME/SELF CARE      DC Planning Services  CM consult      Choice offered to / List presented to:             Status of service:  Completed, signed off Medicare Important Message given?   (If response is "NO", the following Medicare IM given date fields will be blank) Date Medicare IM given:   Date Additional Medicare IM given:    Discharge Disposition:    Per UR Regulation:    If discussed at Long Length of Stay Meetings, dates discussed:    Comments:  08/24/13 1000 Oletta Cohn, RN, BSN, [employer] [phone] Spoke with pt at bedside regarding benefits check for Effient 10mg  QD.  Pt has brochure with 30 day free card and refill assistance card intact.  Pt utilizes [facility] in [place] for prescription needs.  NCM called pharmacy to confirm availability of medication. Information relayed to pt.  Pt verbalizes importance of filling medication upon discharge.

## 2013-08-24 NOTE — Discharge Summary (Signed)
Patient ID: Ronald Mcdowell,  MRN: [id number], DOB/AGE: 04-25-1946 67 y.o.  Admit date: 08/23/2013 Discharge date: 08/24/2013  Primary Care Provider: Ernestine Conrad Primary Cardiologist: Junius Argyle, MD   Discharge Diagnoses Principal Problem:   Unstable angina  **s/p PCI/DES to the LCX this admission.  **residual RCA dzs.  Active Problems:   Coronary artery disease   Tobacco user   Ischemic cardiomyopathy  **EF 35-40% by left ventriculography this admission.   DYSLIPIDEMIA   HTN (hypertension)  Allergies No Known Allergies  Procedures  Cardiac Catheterization and Percutaneous Coronary Intervention 9.29.2014  PROCEDURAL FINDINGS Hemodynamics: AO 134/64 mean 91 mm Hg LV 139/10 mm Hg              Coronary angiography: Coronary dominance: right  Left mainstem: Normal Left anterior descending (LAD): Mild diffuse irregularities less than 10-20%. The first diagonal has 40-50% disease proximally. Left circumflex (LCx): The left circumflex gives rise to a very small OM1 then a large OM2. There is a stent in the mid LCx. At the distal margin of the stent there is a 95-99% stenosis. The OM2 bifurcates distally. The more superior branch has segmental 90% stenosis. This branch is less than 1 mm in diameter.   **The left circumflex was successfully stented using a 2.5 x 20 mm Promus Premier drug-eluting stent.**  Right coronary artery (RCA): The RCA is a dominant vessel. It is diffusely diseased. There is a 40% stenosis in the proximal vessel followed by a long 80% stenosis in the proximal to mid vessel. The distal vessel is diffusely diseased to 70%.  Left ventriculography: Left ventricular systolic function is abnormal, LVEF is estimated at 35-40%, there is inferior akinesis with global hypokinesis, there is no significant mitral regurgitation   Final Conclusions:   1. Two vessel obstructive CAD 2. Moderate LV dysfunction. 3. Successful stenting of the mid LCx with a DES.    Recommendations:  Dual antiplatelet therapy for one year. If patient has refractory symptoms will consider PCI of the RCA. This vessel would require extensive stenting.   _____________   History of Present Illness  67 year old male with prior history of stenting of his left circumflex in 2000 at a facility in [place], who was hospitalized over the summer in [place] secondary to chest pain. Following rule out, patient was discharged and subsequently underwent outpatient stress testing on July 17 demonstrating a moderate to large area of inferolateral scar in the mid to basal segments without significant ischemia. EF was 41%. This was felt to be an intermediate risk study. Patient followed up in clinic on September 25 and reported recurrent exertional chest discomfort. As result, outpatient cardiac catheterization was scheduled.  Hospital Course  Patient presented to the [facility] cone catheterization laboratory on September 29 and underwent diagnostic cardiac catheterization revealing a patent stent in the left circumflex with severe disease distal to the previously placed stent. Patient also had moderate obstructive coronary artery disease in the right coronary artery. EF was 35-40%. The left circumflex was successfully treated using a 2.5 x 20 mm Promus Premier drug-eluting stent. At this time, the right coronary artery be medically managed however if the patient were to have recurrent/refractory angina, consideration for percutaneous intervention will be given.  Post PCI, patient's had no further chest discomfort. He has been ambulating without difficulty and will be discharged home today in good condition.  Discharge Vitals Blood pressure 154/75, pulse 51, temperature 98 F (36.7 C), temperature source Oral, resp. rate 20, height 5\' 5"  (1.651 m),  weight 156 lb 12 oz (71.1 kg), SpO2 99.00%.  Filed Weights   08/23/13 0607 08/24/13 0019  Weight: 157 lb (71.215 kg) 156 lb 12 oz (71.1 kg)    Labs  CBC  Recent Labs  08/24/13 0600  WBC 8.5  HGB 14.9  HCT 41.6  MCV 85.8  PLT 185   Basic Metabolic Panel  Recent Labs  08/23/13 0559 08/24/13 0600  NA 140 140  K 3.3* 3.6  CL 105 105  CO2 24 25  GLUCOSE 95 86  BUN 11 10  CREATININE 0.76 0.82  CALCIUM 9.1 8.8   Disposition  Pt is being discharged home today in good condition.  Follow-up Plans & Appointments      Follow-up Information   Follow up with Ronald Reining, NP On 09/06/2013. (2:30 PM)    Specialty:  Nurse Practitioner   Contact information:   [address] [place] [address] [phone]      Discharge Medications    Medication List         amLODipine 10 MG tablet  Commonly known as:  NORVASC  Take 10 mg by mouth daily.     aspirin 81 MG EC tablet  Take 1 tablet (81 mg total) by mouth daily.     atorvastatin 40 MG tablet  Commonly known as:  LIPITOR  Take 40 mg by mouth at bedtime.     bisoprolol 10 MG tablet  Commonly known as:  ZEBETA  Take 0.5 tablets (5 mg total) by mouth daily.     lisinopril 10 MG tablet  Commonly known as:  PRINIVIL,ZESTRIL  Take 10 mg by mouth every morning.     nitroGLYCERIN 0.4 MG SL tablet  Commonly known as:  NITROSTAT  Place 1 tablet (0.4 mg total) under the tongue every 5 (five) minutes as needed for chest pain.     prasugrel 10 MG Tabs tablet  Commonly known as:  EFFIENT  Take 1 tablet (10 mg total) by mouth daily.       Outstanding Labs/Studies  None  Duration of Discharge Encounter   Greater than 30 minutes including physician time.  Signed, Nicolasa Ducking NP 08/24/2013, 9:02 AM  Patient seen and examined and history reviewed. Agree with above findings and plan. I reviewed his cath films with Dr. Excell Seltzer. He has moderate to severe disease in the proximal to mid and distal RCA. Theses lesions are amenable to PCI with 2 long stents. I discussed with patient returning for elective PCI versus continued medical  management and he would like to return for elective PCI. Will have him follow up as outpatient then plan on readmit electively for PCI of the RCA. Smoking cessation encouraged.  Theron Arista [facility] 08/24/2013 9:38 AM

## 2013-08-24 NOTE — Progress Notes (Signed)
   TELEMETRY: Reviewed telemetry pt in NSR, sinus brady: Filed Vitals:   08/24/13 0455 08/24/13 0718 08/24/13 0746 08/24/13 0800  BP: 124/90  139/77 154/75  Pulse: 55 50 53 51  Temp: 98.1 F (36.7 C)  98 F (36.7 C)   TempSrc: Oral  Oral   Resp: 20  20   Height:      Weight:      SpO2: 98% 97% 98% 99%    Intake/Output Summary (Last 24 hours) at 08/24/13 0941 Last data filed at 08/23/13 1850  Gross per 24 hour  Intake 1158.77 ml  Output    400 ml  Net 758.77 ml    SUBJECTIVE No chest pain or SOB. No palpitations.  LABS: Basic Metabolic Panel:  Recent Labs  40/98/11 0559 08/24/13 0600  NA 140 140  K 3.3* 3.6  CL 105 105  CO2 24 25  GLUCOSE 95 86  BUN 11 10  CREATININE 0.76 0.82  CALCIUM 9.1 8.8   Liver Function Tests: No results found for this basename: AST, ALT, ALKPHOS, BILITOT, PROT, ALBUMIN,  in the last 72 hours No results found for this basename: LIPASE, AMYLASE,  in the last 72 hours CBC:  Recent Labs  08/24/13 0600  WBC 8.5  HGB 14.9  HCT 41.6  MCV 85.8  PLT 185    Radiology/Studies:  No results found.  Ecg: NSR, LVH, old anterior infarct.  PHYSICAL EXAM General: Well developed, well nourished, in no acute distress. Head: Normal Neck: Negative for carotid bruits. JVD not elevated. Lungs: Clear bilaterally to auscultation without wheezes, rales, or rhonchi. Breathing is unlabored. Heart: RRR S1 S2 without murmurs, rubs, or gallops.  Abdomen: Soft, non-tender, non-distended with normoactive bowel sounds.  Msk:  Strength and tone appears normal for age. Extremities: No clubbing, cyanosis or edema.  Distal pedal pulses are 2+ and equal bilaterally. No radial hematoma. Neuro: Alert and oriented X 3. Moves all extremities spontaneously. Psych:  Responds to questions appropriately with a normal affect.  ASSESSMENT AND PLAN: 1. Angina pectoris class 2 despite maximal medical Rx. Intermediate risk myoview. S/p stenting of LCx.I reviewed his  cath films with Dr. Excell Seltzer. He has moderate to severe disease in the proximal to mid and distal RCA. Theses lesions are amenable to PCI with 2 long stents. I discussed with patient returning for elective PCI versus continued medical management and he would like to return for elective PCI. Will have him follow up as outpatient then plan on readmit electively for PCI of the RCA. Smoking cessation encouraged. OK for DC today.  Principal Problem:   Unstable angina Active Problems:   DYSLIPIDEMIA   HTN (hypertension)   Tobacco user   Coronary artery disease   Ischemic cardiomyopathy    Signed, Alexcia Schools [place] MD,FACC 08/24/2013 9:44 AM

## 2013-09-06 ENCOUNTER — Encounter: Payer: Self-pay | Admitting: Adult Health

## 2013-09-06 ENCOUNTER — Encounter: Payer: Self-pay | Admitting: *Deleted

## 2013-09-06 ENCOUNTER — Ambulatory Visit (INDEPENDENT_AMBULATORY_CARE_PROVIDER_SITE_OTHER): Payer: Commercial Managed Care - PPO | Admitting: Adult Health

## 2013-09-06 VITALS — BP 137/73 | HR 65 | Ht 65.0 in | Wt 158.0 lb

## 2013-09-06 DIAGNOSIS — I2589 Other forms of chronic ischemic heart disease: Secondary | ICD-10-CM

## 2013-09-06 DIAGNOSIS — I2510 Atherosclerotic heart disease of native coronary artery without angina pectoris: Secondary | ICD-10-CM

## 2013-09-06 DIAGNOSIS — I255 Ischemic cardiomyopathy: Secondary | ICD-10-CM

## 2013-09-06 DIAGNOSIS — I10 Essential (primary) hypertension: Secondary | ICD-10-CM

## 2013-09-06 NOTE — Assessment & Plan Note (Signed)
He is doing very well and is without cardiac complaint. He is feeling much more energetic and is now ready to return to work. He will remain on DAPT indefinitely. He is given samples of Effient from the office. Letter to return to work on October 20 , 2014. Will see him again in 6 months.

## 2013-09-06 NOTE — Assessment & Plan Note (Signed)
Will reassess EF in 3 months. Would consider changing to coreg from bisoprolol and adding ACE inhibitor. He will be seen again in 6 months.

## 2013-09-06 NOTE — Progress Notes (Deleted)
Name: Ronald Mcdowell    DOB: 05/07/1946  Age: 67 y.o.  MR#: [id number]       PCP:  Ernestine Conrad, MD      Insurance: Payor: Kathrene Bongo / Plan: ONENET PPO / Product Type: *No Product type* /   CC:    Chief Complaint  Patient presents with  . Coronary Artery Disease    S/P PCI/DES to L CX  . Cardiomyopathy    35%-40%    VS Filed Vitals:   09/06/13 1407  BP: 137/73  Pulse: 65  Height: 5\' 5"  (1.651 m)  Weight: 158 lb (71.668 kg)    Weights Current Weight  09/06/13 158 lb (71.668 kg)  08/24/13 156 lb 12 oz (71.1 kg)  08/24/13 156 lb 12 oz (71.1 kg)    Blood Pressure  BP Readings from Last 3 Encounters:  09/06/13 137/73  08/24/13 154/75  08/24/13 154/75     Admit date:  (Not on file) Last encounter with RMR:  08/19/2013   Allergy Review of patient's allergies indicates no known allergies.  Current Outpatient Prescriptions  Medication Sig Dispense Refill  . amLODipine (NORVASC) 10 MG tablet Take 10 mg by mouth daily.        [name] aspirin EC 81 MG EC tablet Take 1 tablet (81 mg total) by mouth daily.  30 tablet  1  . atorvastatin (LIPITOR) 40 MG tablet Take 40 mg by mouth at bedtime.       . bisoprolol (ZEBETA) 10 MG tablet Take 0.5 tablets (5 mg total) by mouth daily.      . nitroGLYCERIN (NITROSTAT) 0.4 MG SL tablet Place 1 tablet (0.4 mg total) under the tongue every 5 (five) minutes as needed for chest pain.  25 tablet  3  . prasugrel (EFFIENT) 10 MG TABS tablet Take 1 tablet (10 mg total) by mouth daily.  30 tablet  6   No current facility-administered medications for this visit.    Discontinued Meds:    Medications Discontinued During This Encounter  Medication Reason  . lisinopril (PRINIVIL,ZESTRIL) 10 MG tablet Error    Patient Active Problem List   Diagnosis Date Noted  . HTN (hypertension) 08/24/2013  . Unstable angina 08/24/2013  . Tobacco user   . Coronary artery disease   . Ischemic cardiomyopathy   . Bradycardia 06/07/2013  . Chest pain 06/06/2013  .  DYSLIPIDEMIA 08/02/2010  . DYSPHAGIA ORAL PHASE 04/13/2010  . PROTEINURIA 04/13/2010  . PURE HYPERCHOLESTEROLEMIA 01/31/2010  . NONDEPENDENT TOBACCO USE DISORDER 01/31/2010  . OLD MYOCARDIAL INFARCTION 01/31/2010  . CORONARY ATHEROSCLEROSIS NATIVE CORONARY ARTERY 01/31/2010  . POSTSURG PERCUT TRANSLUMINAL COR ANGPLSTY STS 01/31/2010    LABS    Component Value Date/Time   NA 140 08/24/2013 0600   NA 140 08/23/2013 0559   NA 136 06/07/2013 0214   K 3.6 08/24/2013 0600   K 3.3* 08/23/2013 0559   K 3.2* 06/07/2013 0214   CL 105 08/24/2013 0600   CL 105 08/23/2013 0559   CL 103 06/07/2013 0214   CO2 25 08/24/2013 0600   CO2 24 08/23/2013 0559   CO2 26 06/07/2013 0214   GLUCOSE 86 08/24/2013 0600   GLUCOSE 95 08/23/2013 0559   GLUCOSE 92 06/07/2013 0214   BUN 10 08/24/2013 0600   BUN 11 08/23/2013 0559   BUN 11 06/07/2013 0214   CREATININE 0.82 08/24/2013 0600   CREATININE 0.76 08/23/2013 0559   CREATININE 0.94 06/07/2013 0214   CALCIUM 8.8 08/24/2013 0600   CALCIUM 9.1  08/23/2013 0559   CALCIUM 8.6 06/07/2013 0214   GFRNONAA >90 08/24/2013 0600   GFRNONAA >90 08/23/2013 0559   GFRNONAA 86* 06/07/2013 0214   GFRAA >90 08/24/2013 0600   GFRAA >90 08/23/2013 0559   GFRAA >90 06/07/2013 0214   CMP     Component Value Date/Time   NA 140 08/24/2013 0600   K 3.6 08/24/2013 0600   CL 105 08/24/2013 0600   CO2 25 08/24/2013 0600   GLUCOSE 86 08/24/2013 0600   BUN 10 08/24/2013 0600   CREATININE 0.82 08/24/2013 0600   CALCIUM 8.8 08/24/2013 0600   PROT 7.1 06/07/2013 0214   ALBUMIN 3.4* 06/07/2013 0214   AST 25 06/07/2013 0214   ALT 25 06/07/2013 0214   ALKPHOS 44 06/07/2013 0214   BILITOT 0.6 06/07/2013 0214   GFRNONAA >90 08/24/2013 0600   GFRAA >90 08/24/2013 0600       Component Value Date/Time   WBC 8.5 08/24/2013 0600   WBC 9.1 08/19/2013 1502   WBC 10.2 06/06/2013 1050   HGB 14.9 08/24/2013 0600   HGB 14.9 08/19/2013 1502   HGB 14.7 06/06/2013 1050   HCT 41.6 08/24/2013 0600   HCT 42.2 08/19/2013 1502    HCT 42.1 06/06/2013 1050   MCV 85.8 08/24/2013 0600   MCV 86.5 08/19/2013 1502   MCV 90.0 06/06/2013 1050    Lipid Panel     Component Value Date/Time   CHOL 127 06/07/2013 0214   TRIG 62 06/07/2013 0214   HDL 37* 06/07/2013 0214   CHOLHDL 3.4 06/07/2013 0214   VLDL 12 06/07/2013 0214   LDLCALC 78 06/07/2013 0214    ABG No results found for this basename: phart, pco2, pco2art, po2, po2art, hco3, tco2, acidbasedef, o2sat     Lab Results  Component Value Date   TSH 0.455 06/06/2013   BNP (last 3 results) No results found for this basename: PROBNP,  in the last 8760 hours Cardiac Panel (last 3 results) No results found for this basename: CKTOTAL, CKMB, TROPONINI, RELINDX,  in the last 72 hours  Iron/TIBC/Ferritin No results found for this basename: iron, tibc, ferritin     EKG Orders placed during the hospital encounter of 08/23/13  . EKG 12-LEAD  . EKG 12-LEAD  . EKG 12-LEAD  . EKG 12-LEAD  . EKG 12-LEAD  . EKG 12-LEAD  . EKG 12-LEAD  . EKG 12-LEAD  . EKG 12-LEAD  . EKG 12-LEAD  . EKG     Prior Assessment and Plan Problem List as of 09/06/2013     Cardiovascular and Mediastinum   OLD MYOCARDIAL INFARCTION   CORONARY ATHEROSCLEROSIS NATIVE CORONARY ARTERY   Last Assessment & Plan   08/19/2013 Office Visit Edited 08/19/2013  3:37 PM by Jodelle Gross, NP     The patient is having classic angina symptoms with known history of CAD and stent unknown artery with stress Myoview demonstrating inferior lateral scar. The patient is on 2 anti-angina medications include amlodipine and bisprolol, with ongoing exertional chest discomfort. The patient states is his energy level is less than as well. He initially attributed to his age, and his wife is concerned.    I reviewed the stress Myoview results with Dr. Dina Rich on site, discuss the need for cardiac catheterization for further evaluation of coronary anatomy and aggression of CAD. Dr. Wyline Mood has reviewed the chart and is in  agreement that the patient would benefit from cardiac catheterization with typical angina symptoms. I discussed this with the patient  and his wife who verbalized understanding and willing to proceed. Risks benefits and addition of procedure were presented.   He is planned for cardiac catheterization on Monday September 29th with Dr. [place].    HTN (hypertension)   Unstable angina   Coronary artery disease   Ischemic cardiomyopathy     Digestive   DYSPHAGIA ORAL PHASE   Last Assessment & Plan   08/19/2013 Office Visit Written 08/19/2013  3:38 PM by Jodelle Gross, NP     The patient may benefit from GI evaluation and possible EGD. Or, possibly ENT evaluation. He is given phone number to doctors Dr's Kendell Bane and [facility] office for further evaluation and or further testing.      Other   PURE HYPERCHOLESTEROLEMIA   DYSLIPIDEMIA   NONDEPENDENT TOBACCO USE DISORDER   PROTEINURIA   POSTSURG PERCUT TRANSLUMINAL COR ANGPLSTY STS   Chest pain   Bradycardia   Tobacco user       Imaging: No results found.

## 2013-09-06 NOTE — Progress Notes (Signed)
HPI: Statins is a 67 year old patient of Dr. Beulah Gandy we are following for ongoing assessment and management of CAD status post hospitalization at [facility] hospital in the setting of unstable angina. The patient had cardiac catheterization and PCI with drug-eluting stents the left circumflex artery with residual RCA disease on 08/23/2013. The patient was placed on DAPT. Recommendations for PCI of the right coronary artery if he had refractory symptoms. Per cath "this vessel would require extensive stenting" the patient apparently continued to smoke, with known history of hypertension and dyslipidemia. Ejection fraction per left ventriculography revealed an EF of 35% to 45% on admission.   He comes today feeling great. No complaints of recurrent chest pain or DOE. He is medically compliant.       No Known Allergies  Current Outpatient Prescriptions  Medication Sig Dispense Refill  . amLODipine (NORVASC) 10 MG tablet Take 10 mg by mouth daily.        [name] aspirin EC 81 MG EC tablet Take 1 tablet (81 mg total) by mouth daily.  30 tablet  1  . atorvastatin (LIPITOR) 40 MG tablet Take 40 mg by mouth at bedtime.       . bisoprolol (ZEBETA) 10 MG tablet Take 0.5 tablets (5 mg total) by mouth daily.      . nitroGLYCERIN (NITROSTAT) 0.4 MG SL tablet Place 1 tablet (0.4 mg total) under the tongue every 5 (five) minutes as needed for chest pain.  25 tablet  3  . prasugrel (EFFIENT) 10 MG TABS tablet Take 1 tablet (10 mg total) by mouth daily.  30 tablet  6   No current facility-administered medications for this visit.    Past Medical History  Diagnosis Date  . Hypertension   . Elevated cholesterol   . Coronary artery disease     a. h/o MI in 2000 w/ prior LCX stenting;  b. 7.2014 Abnl Cardiolite, EF 41%, mod-large inferolat scar;  c. 07/2013 Cath/PCI: LM nl, LAD 10-20, D1 40-50p, LCX 95-99 @ distal stent margin (2.5x20 Promus Premier DES), RCA dom, 40p, 71m, 70d, EF 35-40%.  . Tobacco user   . Ischemic  cardiomyopathy     a. 07/2013 EF 35-40% by LV gram.    Past Surgical History  Procedure Laterality Date  . Coronary stent placement  2000  . Cardiac catheterization    . Transurethral resection of prostate  01/09/2012    Procedure: TRANSURETHRAL RESECTION OF THE PROSTATE (TURP);  Surgeon: Ky Barban, MD;  Location: [facility];  Service: Urology;  Laterality: N/A;  . Coronary angioplasty with stent placement  08/23/2013    mid circumflex  DES    by Dr Swaziland    UJW:JXBJYN of systems complete and found to be negative unless listed above  PHYSICAL EXAM BP 137/73  Pulse 65  Ht 5\' 5"  (1.651 m)  Wt 158 lb (71.668 kg)  BMI 26.29 kg/m2  General: Well developed, well nourished, in no acute distress Head: Eyes PERRLA, No xanthomas.   Normal cephalic and atramatic  Lungs: Clear bilaterally to auscultation and percussion. Heart: HRRR S1 S2, with 1/6  Systolic murmur, heard best at the apex.  Pulses are 2+ & equal.            No carotid bruit. No JVD.  No abdominal bruits. No femoral bruits. Abdomen: Bowel sounds are positive, abdomen soft and non-tender without masses or                  Hernia's  noted. Msk:  Back normal, normal gait. Normal strength and tone for age. Extremities: No clubbing, cyanosis or edema.  DP +1 Neuro: Alert and oriented X 3. Psych:  Good affect, responds appropriately   ASSESSMENT AND PLAN

## 2013-09-06 NOTE — Assessment & Plan Note (Signed)
He is very well controlled currently. Continue amoldipine and bisoprolol. Consider adding ACE.

## 2013-09-06 NOTE — Patient Instructions (Signed)
Your physician recommends that you schedule a follow-up appointment in: 4-6 months You will receive a reminder letter in the mail in about 2 months reminding you to call and schedule your appointment. If you don't receive this letter, please contact our office.  Your physician recommends that you continue on your current medications as directed. Please refer to the Current Medication list given to you today.

## 2013-10-18 DIAGNOSIS — I10 Essential (primary) hypertension: Secondary | ICD-10-CM | POA: Diagnosis not present

## 2013-10-18 DIAGNOSIS — I2510 Atherosclerotic heart disease of native coronary artery without angina pectoris: Secondary | ICD-10-CM | POA: Diagnosis not present

## 2013-10-18 DIAGNOSIS — A5903 Trichomonal cystitis and urethritis: Secondary | ICD-10-CM | POA: Diagnosis not present

## 2013-10-18 DIAGNOSIS — E7800 Pure hypercholesterolemia, unspecified: Secondary | ICD-10-CM | POA: Diagnosis not present

## 2013-11-22 DIAGNOSIS — E7800 Pure hypercholesterolemia, unspecified: Secondary | ICD-10-CM | POA: Diagnosis not present

## 2013-11-22 DIAGNOSIS — I10 Essential (primary) hypertension: Secondary | ICD-10-CM | POA: Diagnosis not present

## 2013-12-10 ENCOUNTER — Telehealth: Payer: Self-pay | Admitting: *Deleted

## 2013-12-10 NOTE — Telephone Encounter (Signed)
Made pt aware

## 2013-12-10 NOTE — Telephone Encounter (Signed)
The patient just had PCI with a drug-eluting stent in September of 2014. He will need to stay on Effient  for a minimum of one year before having any elective procedures. Would not recommend stopping it. If this procedure cannot be rescheduled he will have to have that completed while taking Effient.

## 2013-12-10 NOTE — Telephone Encounter (Signed)
Please advise on how long to hold effient

## 2013-12-10 NOTE — Telephone Encounter (Signed)
Pt states that the dr will not do procedure if pt is on effient. Pt's wife states that the pt is having a really hard time eating and drinking. Is there anything else they can do or try?

## 2013-12-10 NOTE — Telephone Encounter (Signed)
PT needs to stop effient he is going to have a procedure done. Doesn't know how long he needs to stop and the surgens office has not called [place].  [facility] [phone] fax [phone]  Pt would like to hear back today. He has a hard swallowing even water and this is the procedure to fix that

## 2013-12-12 NOTE — Telephone Encounter (Signed)
If he chooses to have procedure done, he will have to accept the risks involved with stopping Plavix.

## 2013-12-13 NOTE — Telephone Encounter (Signed)
LM on pt's phone asking him to let me know if he planned to stop Plavix for GI procedure. If so,  we need to discuss risk of reocclusion of stent

## 2014-01-04 DIAGNOSIS — R1310 Dysphagia, unspecified: Secondary | ICD-10-CM | POA: Diagnosis not present

## 2014-01-04 DIAGNOSIS — K222 Esophageal obstruction: Secondary | ICD-10-CM | POA: Diagnosis not present

## 2014-02-07 DIAGNOSIS — R1310 Dysphagia, unspecified: Secondary | ICD-10-CM | POA: Diagnosis not present

## 2014-02-07 DIAGNOSIS — K222 Esophageal obstruction: Secondary | ICD-10-CM | POA: Diagnosis not present

## 2014-02-14 DIAGNOSIS — I2510 Atherosclerotic heart disease of native coronary artery without angina pectoris: Secondary | ICD-10-CM | POA: Diagnosis not present

## 2014-02-14 DIAGNOSIS — I10 Essential (primary) hypertension: Secondary | ICD-10-CM | POA: Diagnosis not present

## 2014-02-21 DIAGNOSIS — E785 Hyperlipidemia, unspecified: Secondary | ICD-10-CM | POA: Diagnosis not present

## 2014-02-21 DIAGNOSIS — Z7982 Long term (current) use of aspirin: Secondary | ICD-10-CM | POA: Diagnosis not present

## 2014-02-21 DIAGNOSIS — Z9861 Coronary angioplasty status: Secondary | ICD-10-CM | POA: Diagnosis not present

## 2014-02-21 DIAGNOSIS — F17200 Nicotine dependence, unspecified, uncomplicated: Secondary | ICD-10-CM | POA: Diagnosis not present

## 2014-02-21 DIAGNOSIS — R1310 Dysphagia, unspecified: Secondary | ICD-10-CM | POA: Diagnosis not present

## 2014-02-21 DIAGNOSIS — K222 Esophageal obstruction: Secondary | ICD-10-CM | POA: Diagnosis not present

## 2014-02-21 DIAGNOSIS — I252 Old myocardial infarction: Secondary | ICD-10-CM | POA: Diagnosis not present

## 2014-02-21 DIAGNOSIS — Z79899 Other long term (current) drug therapy: Secondary | ICD-10-CM | POA: Diagnosis not present

## 2014-02-21 DIAGNOSIS — I2510 Atherosclerotic heart disease of native coronary artery without angina pectoris: Secondary | ICD-10-CM | POA: Diagnosis not present

## 2014-02-21 DIAGNOSIS — K209 Esophagitis, unspecified without bleeding: Secondary | ICD-10-CM | POA: Diagnosis not present

## 2014-02-21 DIAGNOSIS — I10 Essential (primary) hypertension: Secondary | ICD-10-CM | POA: Diagnosis not present

## 2014-02-25 ENCOUNTER — Ambulatory Visit (INDEPENDENT_AMBULATORY_CARE_PROVIDER_SITE_OTHER): Payer: Medicare Other | Admitting: Adult Health

## 2014-02-25 ENCOUNTER — Encounter: Payer: Self-pay | Admitting: Adult Health

## 2014-02-25 VITALS — BP 137/63 | HR 55 | Ht 65.0 in | Wt 156.0 lb

## 2014-02-25 DIAGNOSIS — I2510 Atherosclerotic heart disease of native coronary artery without angina pectoris: Secondary | ICD-10-CM

## 2014-02-25 DIAGNOSIS — Z720 Tobacco use: Secondary | ICD-10-CM

## 2014-02-25 DIAGNOSIS — I2589 Other forms of chronic ischemic heart disease: Secondary | ICD-10-CM | POA: Diagnosis not present

## 2014-02-25 DIAGNOSIS — F17200 Nicotine dependence, unspecified, uncomplicated: Secondary | ICD-10-CM

## 2014-02-25 DIAGNOSIS — I255 Ischemic cardiomyopathy: Secondary | ICD-10-CM

## 2014-02-25 NOTE — Patient Instructions (Signed)
Your physician recommends that you schedule a follow-up appointment in: 6 months with Dr Koneswaran You will receive a reminder letter two months in advance reminding you to call and schedule your appointment. If you don't receive this letter, please contact our office.  Your physician has requested that you have an echocardiogram. Echocardiography is a painless test that uses sound waves to create images of your heart. It provides your doctor with information about the size and shape of your heart and how well your heart's chambers and valves are working. This procedure takes approximately one hour. There are no restrictions for this procedure.     

## 2014-02-25 NOTE — Assessment & Plan Note (Signed)
Stopped smoking 

## 2014-02-25 NOTE — Progress Notes (Signed)
HPI: [name]  is a 68 year old patient of Dr. Bronson Ing we are following for ongoing assessment and management of CAD, status post PCI with drug-eluting stent to the left circumflex artery with residual RCA disease in September 2014. The patient was continued on DAPT the patient for PCI the RCA he had refractory symptoms. "The vessel would require extensive stenting". Ejection fraction left ventriculography revealed EF of 35-45%. He is due for repeat echo.    He comes today for follow up. He is doing very well. He has had some GI issues and has required esophageal dilation X 2. He is medically complaint. Has retired and is now active in his church and playing soft ball. No recurrence of chest pain or DOE.   No Known Allergies  Current Outpatient Prescriptions  Medication Sig Dispense Refill  . amLODipine (NORVASC) 10 MG tablet Take 10 mg by mouth daily.        [name] aspirin EC 81 MG EC tablet Take 1 tablet (81 mg total) by mouth daily.  30 tablet  1  . atorvastatin (LIPITOR) 40 MG tablet Take 40 mg by mouth at bedtime.       . bisoprolol (ZEBETA) 10 MG tablet Take 0.5 tablets (5 mg total) by mouth daily.      . nitroGLYCERIN (NITROSTAT) 0.4 MG SL tablet Place 1 tablet (0.4 mg total) under the tongue every 5 (five) minutes as needed for chest pain.  25 tablet  3  . prasugrel (EFFIENT) 10 MG TABS tablet Take 1 tablet (10 mg total) by mouth daily.  30 tablet  6  . lisinopril (PRINIVIL,ZESTRIL) 10 MG tablet        No current facility-administered medications for this visit.    Past Medical History  Diagnosis Date  . Hypertension   . Elevated cholesterol   . Coronary artery disease     a. h/o MI in 2000 w/ prior LCX stenting;  b. 7.2014 Abnl Cardiolite, EF 41%, mod-large inferolat scar;  c. 07/2013 Cath/PCI: LM nl, LAD 10-20, D1 40-50p, LCX 95-99 @ distal stent margin (2.5x20 Promus Premier DES), RCA dom, 40p, 67m, 70d, EF 35-40%.  . Tobacco user   . Ischemic cardiomyopathy     a. 07/2013 EF  35-40% by LV gram.    Past Surgical History  Procedure Laterality Date  . Coronary stent placement  2000  . Cardiac catheterization    . Transurethral resection of prostate  01/09/2012    Procedure: TRANSURETHRAL RESECTION OF THE PROSTATE (TURP);  Surgeon: Marissa Nestle, MD;  Location: [facility];  Service: Urology;  Laterality: N/A;  . Coronary angioplasty with stent placement  08/23/2013    mid circumflex  DES    by Dr Martinique    ROS:  Review of systems complete and found to be negative unless listed above   PHYSICAL EXAM BP 137/63  Pulse 55  Ht 5\' 5"  (1.651 m)  Wt 156 lb (70.761 kg)  BMI 25.96 kg/m2  SpO2 98% General: Well developed, well nourished, in no acute distress Head: Eyes PERRLA, No xanthomas.   Normal cephalic and atramatic  Lungs: Clear bilaterally to auscultation and percussion. Heart: HRRR S1 S2, without MRG.  Pulses are 2+ & equal.            No carotid bruit. No JVD.  No abdominal bruits. No femoral bruits. Abdomen: Bowel sounds are positive, abdomen soft and non-tender without masses or  Hernia's noted. Msk:  Back normal, normal gait. Normal strength and tone for age. Extremities: No clubbing, cyanosis or edema.  DP +1 Neuro: Alert and oriented X 3. Psych:  Good affect, responds appropriately     ASSESSMENT AND PLAN

## 2014-02-25 NOTE — Progress Notes (Deleted)
Name: Ronald Mcdowell    DOB: 1946-08-22  Age: 68 y.o.  MR#: [id number]       PCP:  Celedonio Savage, MD      Insurance: Payor: MEDICARE / Plan: MEDICARE PART A AND B / Product Type: *No Product type* /   CC:    Chief Complaint  Patient presents with  . Coronary Artery Disease  . Hypertension    VS Filed Vitals:   02/25/14 1305  BP: 137/63  Pulse: 55  Height: 5\' 5"  (1.651 m)  Weight: 156 lb (70.761 kg)  SpO2: 98%    Weights Current Weight  02/25/14 156 lb (70.761 kg)  09/06/13 158 lb (71.668 kg)  08/24/13 156 lb 12 oz (71.1 kg)    Blood Pressure  BP Readings from Last 3 Encounters:  02/25/14 137/63  09/06/13 137/73  08/24/13 154/75     Admit date:  (Not on file) Last encounter with RMR:  09/06/2013   Allergy Review of patient's allergies indicates no known allergies.  Current Outpatient Prescriptions  Medication Sig Dispense Refill  . amLODipine (NORVASC) 10 MG tablet Take 10 mg by mouth daily.        [name] aspirin EC 81 MG EC tablet Take 1 tablet (81 mg total) by mouth daily.  30 tablet  1  . atorvastatin (LIPITOR) 40 MG tablet Take 40 mg by mouth at bedtime.       . bisoprolol (ZEBETA) 10 MG tablet Take 0.5 tablets (5 mg total) by mouth daily.      . nitroGLYCERIN (NITROSTAT) 0.4 MG SL tablet Place 1 tablet (0.4 mg total) under the tongue every 5 (five) minutes as needed for chest pain.  25 tablet  3  . prasugrel (EFFIENT) 10 MG TABS tablet Take 1 tablet (10 mg total) by mouth daily.  30 tablet  6  . lisinopril (PRINIVIL,ZESTRIL) 10 MG tablet        No current facility-administered medications for this visit.    Discontinued Meds:   There are no discontinued medications.  Patient Active Problem List   Diagnosis Date Noted  . HTN (hypertension) 08/24/2013  . Unstable angina 08/24/2013  . Tobacco user   . Coronary artery disease   . Ischemic cardiomyopathy   . Bradycardia 06/07/2013  . Chest pain 06/06/2013  . DYSLIPIDEMIA 08/02/2010  . DYSPHAGIA ORAL PHASE  04/13/2010  . PROTEINURIA 04/13/2010  . PURE HYPERCHOLESTEROLEMIA 01/31/2010  . NONDEPENDENT TOBACCO USE DISORDER 01/31/2010  . OLD MYOCARDIAL INFARCTION 01/31/2010  . CORONARY ATHEROSCLEROSIS NATIVE CORONARY ARTERY 01/31/2010  . POSTSURG PERCUT TRANSLUMINAL COR ANGPLSTY STS 01/31/2010    LABS    Component Value Date/Time   NA 140 08/24/2013 0600   NA 140 08/23/2013 0559   NA 136 06/07/2013 0214   K 3.6 08/24/2013 0600   K 3.3* 08/23/2013 0559   K 3.2* 06/07/2013 0214   CL 105 08/24/2013 0600   CL 105 08/23/2013 0559   CL 103 06/07/2013 0214   CO2 25 08/24/2013 0600   CO2 24 08/23/2013 0559   CO2 26 06/07/2013 0214   GLUCOSE 86 08/24/2013 0600   GLUCOSE 95 08/23/2013 0559   GLUCOSE 92 06/07/2013 0214   BUN 10 08/24/2013 0600   BUN 11 08/23/2013 0559   BUN 11 06/07/2013 0214   CREATININE 0.82 08/24/2013 0600   CREATININE 0.76 08/23/2013 0559   CREATININE 0.94 06/07/2013 0214   CALCIUM 8.8 08/24/2013 0600   CALCIUM 9.1 08/23/2013 0559   CALCIUM 8.6 06/07/2013 0214  GFRNONAA >90 08/24/2013 0600   GFRNONAA >90 08/23/2013 0559   GFRNONAA 86* 06/07/2013 0214   GFRAA >90 08/24/2013 0600   GFRAA >90 08/23/2013 0559   GFRAA >90 06/07/2013 0214   CMP     Component Value Date/Time   NA 140 08/24/2013 0600   K 3.6 08/24/2013 0600   CL 105 08/24/2013 0600   CO2 25 08/24/2013 0600   GLUCOSE 86 08/24/2013 0600   BUN 10 08/24/2013 0600   CREATININE 0.82 08/24/2013 0600   CALCIUM 8.8 08/24/2013 0600   PROT 7.1 06/07/2013 0214   ALBUMIN 3.4* 06/07/2013 0214   AST 25 06/07/2013 0214   ALT 25 06/07/2013 0214   ALKPHOS 44 06/07/2013 0214   BILITOT 0.6 06/07/2013 0214   GFRNONAA >90 08/24/2013 0600   GFRAA >90 08/24/2013 0600       Component Value Date/Time   WBC 8.5 08/24/2013 0600   WBC 9.1 08/19/2013 1502   WBC 10.2 06/06/2013 1050   HGB 14.9 08/24/2013 0600   HGB 14.9 08/19/2013 1502   HGB 14.7 06/06/2013 1050   HCT 41.6 08/24/2013 0600   HCT 42.2 08/19/2013 1502   HCT 42.1 06/06/2013 1050   MCV 85.8 08/24/2013  0600   MCV 86.5 08/19/2013 1502   MCV 90.0 06/06/2013 1050    Lipid Panel     Component Value Date/Time   CHOL 127 06/07/2013 0214   TRIG 62 06/07/2013 0214   HDL 37* 06/07/2013 0214   CHOLHDL 3.4 06/07/2013 0214   VLDL 12 06/07/2013 0214   LDLCALC 78 06/07/2013 0214    ABG No results found for this basename: phart, pco2, pco2art, po2, po2art, hco3, tco2, acidbasedef, o2sat     Lab Results  Component Value Date   TSH 0.455 06/06/2013   BNP (last 3 results) No results found for this basename: PROBNP,  in the last 8760 hours Cardiac Panel (last 3 results) No results found for this basename: CKTOTAL, CKMB, TROPONINI, RELINDX,  in the last 72 hours  Iron/TIBC/Ferritin No results found for this basename: iron, tibc, ferritin     EKG Orders placed during the hospital encounter of 08/23/13  . EKG 12-LEAD  . EKG 12-LEAD  . EKG 12-LEAD  . EKG 12-LEAD  . EKG 12-LEAD  . EKG 12-LEAD  . EKG 12-LEAD  . EKG 12-LEAD  . EKG 12-LEAD  . EKG 12-LEAD  . EKG     Prior Assessment and Plan Problem List as of 02/25/2014     Cardiovascular and Mediastinum   OLD MYOCARDIAL INFARCTION   CORONARY ATHEROSCLEROSIS NATIVE CORONARY ARTERY   Last Assessment & Plan   08/19/2013 Office Visit Edited 08/19/2013  3:37 PM by Lendon Colonel, NP     The patient is having classic angina symptoms with known history of CAD and stent unknown artery with stress Myoview demonstrating inferior lateral scar. The patient is on 2 anti-angina medications include amlodipine and bisprolol, with ongoing exertional chest discomfort. The patient states is his energy level is less than as well. He initially attributed to his age, and his wife is concerned.    I reviewed the stress Myoview results with Dr. Carlyle Dolly on site, discuss the need for cardiac catheterization for further evaluation of coronary anatomy and aggression of CAD. Dr. Harl Bowie has reviewed the chart and is in agreement that the patient would benefit from  cardiac catheterization with typical angina symptoms. I discussed this with the patient and his wife who verbalized understanding and willing to proceed.  Risks benefits and addition of procedure were presented.   He is planned for cardiac catheterization on Monday September 29th with Dr. Martinique.    HTN (hypertension)   Last Assessment & Plan   09/06/2013 Office Visit Written 09/06/2013  2:21 PM by Lendon Colonel, NP     He is very well controlled currently. Continue amoldipine and bisoprolol. Consider adding ACE.     Unstable angina   Coronary artery disease   Last Assessment & Plan   09/06/2013 Office Visit Written 09/06/2013  2:20 PM by Lendon Colonel, NP     He is doing very well and is without cardiac complaint. He is feeling much more energetic and is now ready to return to work. He will remain on DAPT indefinitely. He is given samples of Effient from the office. Letter to return to work on October 20 , 2014. Will see him again in 6 months.    Ischemic cardiomyopathy   Last Assessment & Plan   09/06/2013 Office Visit Written 09/06/2013  2:22 PM by Lendon Colonel, NP     Will reassess EF in 3 months. Would consider changing to coreg from bisoprolol and adding ACE inhibitor. He will be seen again in 6 months.      Digestive   DYSPHAGIA ORAL PHASE   Last Assessment & Plan   08/19/2013 Office Visit Written 08/19/2013  3:38 PM by Lendon Colonel, NP     The patient may benefit from GI evaluation and possible EGD. Or, possibly ENT evaluation. He is given phone number to doctors Dr's Sydell Axon and [place] office for further evaluation and or further testing.      Other   PURE HYPERCHOLESTEROLEMIA   DYSLIPIDEMIA   NONDEPENDENT TOBACCO USE DISORDER   PROTEINURIA   POSTSURG PERCUT TRANSLUMINAL COR ANGPLSTY STS   Chest pain   Bradycardia   Tobacco user       Imaging: No results found.

## 2014-02-25 NOTE — Assessment & Plan Note (Signed)
He is without chest pain or symptoms of fatigue. He asks about stopping Effient and I have explained to him that this will be revisited in 6 months once he has been on DAPT for one year. He verbalizes understanding.

## 2014-02-25 NOTE — Assessment & Plan Note (Signed)
Will repeat his echo in 2-3 months. He is without complaint of DOE or palpitations. He is medically compliant and has become more active. Will continue him on current medications.

## 2014-03-03 DIAGNOSIS — R1310 Dysphagia, unspecified: Secondary | ICD-10-CM | POA: Diagnosis not present

## 2014-03-03 DIAGNOSIS — K222 Esophageal obstruction: Secondary | ICD-10-CM | POA: Diagnosis not present

## 2014-03-14 DIAGNOSIS — Z79899 Other long term (current) drug therapy: Secondary | ICD-10-CM | POA: Diagnosis not present

## 2014-03-14 DIAGNOSIS — Q391 Atresia of esophagus with tracheo-esophageal fistula: Secondary | ICD-10-CM | POA: Diagnosis not present

## 2014-03-14 DIAGNOSIS — E785 Hyperlipidemia, unspecified: Secondary | ICD-10-CM | POA: Diagnosis not present

## 2014-03-14 DIAGNOSIS — I10 Essential (primary) hypertension: Secondary | ICD-10-CM | POA: Diagnosis not present

## 2014-03-14 DIAGNOSIS — Z7982 Long term (current) use of aspirin: Secondary | ICD-10-CM | POA: Diagnosis not present

## 2014-03-14 DIAGNOSIS — I252 Old myocardial infarction: Secondary | ICD-10-CM | POA: Diagnosis not present

## 2014-03-14 DIAGNOSIS — F17200 Nicotine dependence, unspecified, uncomplicated: Secondary | ICD-10-CM | POA: Diagnosis not present

## 2014-03-14 DIAGNOSIS — K449 Diaphragmatic hernia without obstruction or gangrene: Secondary | ICD-10-CM | POA: Diagnosis not present

## 2014-03-14 DIAGNOSIS — I2510 Atherosclerotic heart disease of native coronary artery without angina pectoris: Secondary | ICD-10-CM | POA: Diagnosis not present

## 2014-03-14 DIAGNOSIS — R1310 Dysphagia, unspecified: Secondary | ICD-10-CM | POA: Diagnosis not present

## 2014-03-14 DIAGNOSIS — K222 Esophageal obstruction: Secondary | ICD-10-CM | POA: Diagnosis not present

## 2014-03-14 DIAGNOSIS — Z9861 Coronary angioplasty status: Secondary | ICD-10-CM | POA: Diagnosis not present

## 2014-03-29 DIAGNOSIS — K222 Esophageal obstruction: Secondary | ICD-10-CM | POA: Diagnosis not present

## 2014-03-29 DIAGNOSIS — R1310 Dysphagia, unspecified: Secondary | ICD-10-CM | POA: Diagnosis not present

## 2014-04-04 DIAGNOSIS — I10 Essential (primary) hypertension: Secondary | ICD-10-CM | POA: Diagnosis not present

## 2014-04-04 DIAGNOSIS — I2510 Atherosclerotic heart disease of native coronary artery without angina pectoris: Secondary | ICD-10-CM | POA: Diagnosis not present

## 2014-04-04 DIAGNOSIS — I252 Old myocardial infarction: Secondary | ICD-10-CM | POA: Diagnosis not present

## 2014-04-04 DIAGNOSIS — Z79899 Other long term (current) drug therapy: Secondary | ICD-10-CM | POA: Diagnosis not present

## 2014-04-04 DIAGNOSIS — K222 Esophageal obstruction: Secondary | ICD-10-CM | POA: Diagnosis not present

## 2014-04-04 DIAGNOSIS — E785 Hyperlipidemia, unspecified: Secondary | ICD-10-CM | POA: Diagnosis not present

## 2014-04-04 DIAGNOSIS — F17200 Nicotine dependence, unspecified, uncomplicated: Secondary | ICD-10-CM | POA: Diagnosis not present

## 2014-04-04 DIAGNOSIS — Z9861 Coronary angioplasty status: Secondary | ICD-10-CM | POA: Diagnosis not present

## 2014-04-04 DIAGNOSIS — R1310 Dysphagia, unspecified: Secondary | ICD-10-CM | POA: Diagnosis not present

## 2014-04-04 DIAGNOSIS — Z7982 Long term (current) use of aspirin: Secondary | ICD-10-CM | POA: Diagnosis not present

## 2014-04-13 ENCOUNTER — Telehealth: Payer: Self-pay | Admitting: *Deleted

## 2014-04-13 NOTE — Telephone Encounter (Signed)
Needs work release was on disability since sept 2014 after stent placement he now  wants to go back to work and needs to have any specific limitations listed also if it applies

## 2014-04-13 NOTE — Telephone Encounter (Signed)
PT NEEDS A WORK EXCUSE TO GO BACK.

## 2014-04-15 ENCOUNTER — Encounter: Payer: Self-pay | Admitting: *Deleted

## 2014-04-15 DIAGNOSIS — Z7982 Long term (current) use of aspirin: Secondary | ICD-10-CM | POA: Diagnosis not present

## 2014-04-15 DIAGNOSIS — Z8249 Family history of ischemic heart disease and other diseases of the circulatory system: Secondary | ICD-10-CM | POA: Diagnosis not present

## 2014-04-15 DIAGNOSIS — F17200 Nicotine dependence, unspecified, uncomplicated: Secondary | ICD-10-CM | POA: Diagnosis not present

## 2014-04-15 DIAGNOSIS — R5381 Other malaise: Secondary | ICD-10-CM | POA: Diagnosis not present

## 2014-04-15 DIAGNOSIS — I10 Essential (primary) hypertension: Secondary | ICD-10-CM | POA: Diagnosis not present

## 2014-04-15 DIAGNOSIS — Z79899 Other long term (current) drug therapy: Secondary | ICD-10-CM | POA: Diagnosis not present

## 2014-04-15 DIAGNOSIS — I252 Old myocardial infarction: Secondary | ICD-10-CM | POA: Diagnosis not present

## 2014-04-15 DIAGNOSIS — E876 Hypokalemia: Secondary | ICD-10-CM | POA: Diagnosis not present

## 2014-04-15 DIAGNOSIS — I200 Unstable angina: Secondary | ICD-10-CM | POA: Diagnosis not present

## 2014-04-15 DIAGNOSIS — R42 Dizziness and giddiness: Secondary | ICD-10-CM | POA: Diagnosis not present

## 2014-04-15 DIAGNOSIS — R5383 Other fatigue: Secondary | ICD-10-CM | POA: Diagnosis not present

## 2014-04-15 DIAGNOSIS — E7800 Pure hypercholesterolemia, unspecified: Secondary | ICD-10-CM | POA: Diagnosis not present

## 2014-04-15 NOTE — Telephone Encounter (Signed)
Will need an appt for assesment before allow him to return to work.

## 2014-04-15 NOTE — Telephone Encounter (Signed)
Sorry, was just seen in April. May go back to work without restrictions Please write letter for me to sign.

## 2014-04-15 NOTE — Telephone Encounter (Signed)
Letter made, and ready for NP signature

## 2014-06-29 ENCOUNTER — Telehealth: Payer: Self-pay | Admitting: Adult Health

## 2014-06-29 MED ORDER — PRASUGREL HCL 10 MG PO TABS: 10.0000 mg | ORAL_TABLET | Freq: Every day | ORAL | Status: DC

## 2014-06-29 NOTE — Telephone Encounter (Signed)
Patient needs refill on Effient sent to [facility] in [place] if he is to continue taking this med.  / tgs

## 2014-06-29 NOTE — Telephone Encounter (Signed)
Medication sent via escribe.  

## 2014-08-15 ENCOUNTER — Ambulatory Visit (INDEPENDENT_AMBULATORY_CARE_PROVIDER_SITE_OTHER): Payer: Medicare Other | Admitting: Cardiovascular Disease

## 2014-08-15 ENCOUNTER — Encounter: Payer: Self-pay | Admitting: Cardiovascular Disease

## 2014-08-15 VITALS — BP 130/72 | HR 59 | Ht 65.0 in | Wt 153.0 lb

## 2014-08-15 DIAGNOSIS — Z716 Tobacco abuse counseling: Secondary | ICD-10-CM

## 2014-08-15 DIAGNOSIS — I10 Essential (primary) hypertension: Secondary | ICD-10-CM

## 2014-08-15 DIAGNOSIS — I2589 Other forms of chronic ischemic heart disease: Secondary | ICD-10-CM

## 2014-08-15 DIAGNOSIS — Z7189 Other specified counseling: Secondary | ICD-10-CM

## 2014-08-15 DIAGNOSIS — I2510 Atherosclerotic heart disease of native coronary artery without angina pectoris: Secondary | ICD-10-CM | POA: Diagnosis not present

## 2014-08-15 DIAGNOSIS — E785 Hyperlipidemia, unspecified: Secondary | ICD-10-CM | POA: Diagnosis not present

## 2014-08-15 DIAGNOSIS — I255 Ischemic cardiomyopathy: Secondary | ICD-10-CM

## 2014-08-15 DIAGNOSIS — F17200 Nicotine dependence, unspecified, uncomplicated: Secondary | ICD-10-CM

## 2014-08-15 NOTE — Patient Instructions (Addendum)
Your physician wants you to follow-up in: 6 months with Dr. Virgina Jock will receive a reminder letter in the mail two months in advance. If you don't receive a letter, please call our office to schedule the follow-up appointment.  Your physician recommends that you continue on your current medications as directed. Please refer to the Current Medication list given to you today.  We will request your labs from Dr. Wenda Overland  Thank you for choosing [facility]!!

## 2014-08-15 NOTE — Progress Notes (Signed)
Patient ID: Ronald Mcdowell, male   DOB: December 16, 1945, 67 y.o.   MRN: [id number]      SUBJECTIVE: The patient is a 68 year old male who presents for routine cardiovascular followup. He has a history of coronary artery disease with stenting of the left circumflex coronary artery in September 2014. At that time he was noted to have significant RCA disease, with a 40% stenosis in the proximal vessel followed by a long 80% stenosis in the proximal to mid vessel. The distal vessel was diffusely diseased to 70%. It was noted at that time that if he were to have recalcitrant chest pain refractory to medical therapy, the RCA would require extensive stenting. He also has hypertension, hyperlipidemia, tobacco abuse, and an ischemic cardiomyopathy, with nuclear LVEF 41% in 05/2013 with inferolateral hypokinesis. The patient denies any symptoms of chest pain, palpitations, shortness of breath, lightheadedness, dizziness, leg swelling, orthopnea, PND, and syncope. He continues to smoke 0.5 ppd.  He ran out of Effient in July and said he called the office but never had his call returned and thus stopped taking it.  ECG performed in the office today demonstrates sinus bradycardia, heart rate 54 beats per minute, with inferolateral T wave inversions suggestive of inferolateral ischemia with old septal infarct.  Review of Systems: As per "subjective", otherwise negative.  No Known Allergies  Current Outpatient Prescriptions  Medication Sig Dispense Refill  . amLODipine (NORVASC) 10 MG tablet Take 10 mg by mouth daily.        [name] aspirin EC 81 MG EC tablet Take 1 tablet (81 mg total) by mouth daily.  30 tablet  1  . atorvastatin (LIPITOR) 40 MG tablet Take 40 mg by mouth at bedtime.       . bisoprolol (ZEBETA) 10 MG tablet Take 0.5 tablets (5 mg total) by mouth daily.      [name] lisinopril (PRINIVIL,ZESTRIL) 10 MG tablet       . nitroGLYCERIN (NITROSTAT) 0.4 MG SL tablet Place 1 tablet (0.4 mg total) under the tongue  every 5 (five) minutes as needed for chest pain.  25 tablet  3  . prasugrel (EFFIENT) 10 MG TABS tablet Take 1 tablet (10 mg total) by mouth daily.  30 tablet  6   No current facility-administered medications for this visit.    Past Medical History  Diagnosis Date  . Hypertension   . Elevated cholesterol   . Coronary artery disease     a. h/o MI in 2000 w/ prior LCX stenting;  b. 7.2014 Abnl Cardiolite, EF 41%, mod-large inferolat scar;  c. 07/2013 Cath/PCI: LM nl, LAD 10-20, D1 40-50p, LCX 95-99 @ distal stent margin (2.5x20 Promus Premier DES), RCA dom, 40p, 40m, 70d, EF 35-40%.  . Tobacco user   . Ischemic cardiomyopathy     a. 07/2013 EF 35-40% by LV gram.    Past Surgical History  Procedure Laterality Date  . Coronary stent placement  2000  . Cardiac catheterization    . Transurethral resection of prostate  01/09/2012    Procedure: TRANSURETHRAL RESECTION OF THE PROSTATE (TURP);  Surgeon: Marissa Nestle, MD;  Location: [facility];  Service: Urology;  Laterality: N/A;  . Coronary angioplasty with stent placement  08/23/2013    mid circumflex  DES    by Dr Martinique    History   Social History  . Marital Status: Married    Spouse Name: N/A    Number of Children: N/A  . Years of Education: N/A  Occupational History  . Not on file.   Social History Main Topics  . Smoking status: Current Every Day Smoker -- 1.00 packs/day for 40 years    Types: Cigarettes  . Smokeless tobacco: Never Used  . Alcohol Use: No  . Drug Use: No  . Sexual Activity: Yes    Birth Control/ Protection: None   Other Topics Concern  . Not on file   Social History Narrative   Pt gets regular exercise.      PHYSICAL EXAM General: NAD HEENT: Normal. Neck: No JVD, no thyromegaly. Lungs: Clear to auscultation bilaterally with normal respiratory effort. CV: Nondisplaced PMI.  Regular rate and rhythm, normal S1/S2, no S3/S4, no murmur. No pretibial or periankle edema.  No carotid bruit.  Normal  pedal pulses.  Abdomen: Soft, nontender, no hepatosplenomegaly, no distention.  Neurologic: Alert and oriented x 3.  Psych: Normal affect. Skin: Normal. Musculoskeletal: Normal range of motion, no gross deformities. Extremities: No clubbing or cyanosis.   ECG: Most recent ECG reviewed.      ASSESSMENT AND PLAN: 1. CAD: Stable ischemic heart disease. Continue current therapy with ASA, Lipitor, and bisoprolol. 2. Essential HTN: Controlled on current therapy which includes amlodipine 10 mg and lisinopril 10 mg. 3. Hyperlipidemia: Will obtain copy of most recent lipids from PCP's office. Continue Lipitor 40 mg hs. 4. Tobacco abuse history: Cessation counseling given. 5. Ischemic cardiomyopathy: No signs of heart failure. Continue ACEI and beta blocker.  Dispo: f/u 6 months.  Kate Sable, M.D., F.A.C.C.

## 2014-08-22 DIAGNOSIS — E785 Hyperlipidemia, unspecified: Secondary | ICD-10-CM | POA: Diagnosis not present

## 2014-08-22 DIAGNOSIS — I10 Essential (primary) hypertension: Secondary | ICD-10-CM | POA: Diagnosis not present

## 2014-08-22 DIAGNOSIS — I2510 Atherosclerotic heart disease of native coronary artery without angina pectoris: Secondary | ICD-10-CM | POA: Diagnosis not present

## 2014-08-22 DIAGNOSIS — F17200 Nicotine dependence, unspecified, uncomplicated: Secondary | ICD-10-CM | POA: Diagnosis not present

## 2014-08-22 DIAGNOSIS — Z23 Encounter for immunization: Secondary | ICD-10-CM | POA: Diagnosis not present

## 2014-10-03 DIAGNOSIS — R1310 Dysphagia, unspecified: Secondary | ICD-10-CM | POA: Diagnosis not present

## 2014-10-03 DIAGNOSIS — K222 Esophageal obstruction: Secondary | ICD-10-CM | POA: Diagnosis not present

## 2014-11-03 ENCOUNTER — Encounter (HOSPITAL_COMMUNITY): Payer: Self-pay | Admitting: Cardiology

## 2014-11-22 DIAGNOSIS — F17290 Nicotine dependence, other tobacco product, uncomplicated: Secondary | ICD-10-CM | POA: Diagnosis not present

## 2014-11-22 DIAGNOSIS — I2510 Atherosclerotic heart disease of native coronary artery without angina pectoris: Secondary | ICD-10-CM | POA: Diagnosis not present

## 2014-11-22 DIAGNOSIS — E784 Other hyperlipidemia: Secondary | ICD-10-CM | POA: Diagnosis not present

## 2014-11-22 DIAGNOSIS — I10 Essential (primary) hypertension: Secondary | ICD-10-CM | POA: Diagnosis not present

## 2015-02-07 ENCOUNTER — Encounter: Payer: Self-pay | Admitting: Cardiovascular Disease

## 2015-02-07 ENCOUNTER — Ambulatory Visit (INDEPENDENT_AMBULATORY_CARE_PROVIDER_SITE_OTHER): Payer: Medicare Other | Admitting: Cardiovascular Disease

## 2015-02-07 VITALS — BP 132/82 | HR 60 | Ht 65.0 in | Wt 156.2 lb

## 2015-02-07 DIAGNOSIS — Z716 Tobacco abuse counseling: Secondary | ICD-10-CM

## 2015-02-07 DIAGNOSIS — I2510 Atherosclerotic heart disease of native coronary artery without angina pectoris: Secondary | ICD-10-CM | POA: Diagnosis not present

## 2015-02-07 DIAGNOSIS — I10 Essential (primary) hypertension: Secondary | ICD-10-CM

## 2015-02-07 DIAGNOSIS — I255 Ischemic cardiomyopathy: Secondary | ICD-10-CM | POA: Diagnosis not present

## 2015-02-07 DIAGNOSIS — E785 Hyperlipidemia, unspecified: Secondary | ICD-10-CM

## 2015-02-07 NOTE — Patient Instructions (Signed)

## 2015-02-07 NOTE — Progress Notes (Signed)
Patient ID: Ronald Mcdowell, male   DOB: [birth year], 69 y.o.   MRN: [id number]      SUBJECTIVE: The patient is a 69 year old male who presents for routine cardiovascular followup. He has a history of coronary artery disease with stenting of the left circumflex coronary artery in September 2014. At that time he was noted to have significant RCA disease, with a 40% stenosis in the proximal vessel followed by a long 80% stenosis in the proximal to mid vessel. The distal vessel was diffusely diseased to 70%. It was noted at that time that if he were to have recalcitrant chest pain refractory to medical therapy, the RCA would require extensive stenting. He also has hypertension, hyperlipidemia, tobacco abuse, and an ischemic cardiomyopathy, with nuclear LVEF 41% in 05/2013 with inferolateral hypokinesis.  The patient denies any symptoms of chest pain, palpitations, shortness of breath, lightheadedness, dizziness, leg swelling, orthopnea, PND, and syncope. He continues to smoke 0.5 ppd but had been smoking 2 ppd.   He works at an [employer] in [place].   Review of Systems: As per "subjective", otherwise negative.  No Known Allergies  Current Outpatient Prescriptions  Medication Sig Dispense Refill  . amLODipine (NORVASC) 10 MG tablet Take 10 mg by mouth daily.      [name] aspirin EC 81 MG EC tablet Take 1 tablet (81 mg total) by mouth daily. 30 tablet 1  . atorvastatin (LIPITOR) 40 MG tablet Take 40 mg by mouth at bedtime.     . bisoprolol (ZEBETA) 10 MG tablet Take 0.5 tablets (5 mg total) by mouth daily.    [name] lisinopril (PRINIVIL,ZESTRIL) 10 MG tablet     . nitroGLYCERIN (NITROSTAT) 0.4 MG SL tablet Place 1 tablet (0.4 mg total) under the tongue every 5 (five) minutes as needed for chest pain. 25 tablet 3  . omeprazole (PRILOSEC) 20 MG capsule      No current facility-administered medications for this visit.    Past Medical History  Diagnosis Date  . Hypertension   . Elevated  cholesterol   . Coronary artery disease     a. h/o MI in 2000 w/ prior LCX stenting;  b. 7.2014 Abnl Cardiolite, EF 41%, mod-large inferolat scar;  c. 07/2013 Cath/PCI: LM nl, LAD 10-20, D1 40-50p, LCX 95-99 @ distal stent margin (2.5x20 Promus Premier DES), RCA dom, 40p, 60m, 70d, EF 35-40%.  . Tobacco user   . Ischemic cardiomyopathy     a. 07/2013 EF 35-40% by LV gram.    Past Surgical History  Procedure Laterality Date  . Coronary stent placement  2000  . Cardiac catheterization    . Transurethral resection of prostate  01/09/2012    Procedure: TRANSURETHRAL RESECTION OF THE PROSTATE (TURP);  Surgeon: Marissa Nestle, MD;  Location: [facility];  Service: Urology;  Laterality: N/A;  . Coronary angioplasty with stent placement  08/23/2013    mid circumflex  DES    by Dr Martinique  . Left heart catheterization with coronary angiogram N/A 08/23/2013    Procedure: LEFT HEART CATHETERIZATION WITH CORONARY ANGIOGRAM;  Surgeon: Peter M Martinique, MD;  Location[facility] C[facility];  Service: Cardiovascular;  Laterality: N/A;    History   Social History  . Marital Status: Married    Spouse Name: N/A  . Number of Children: N/A  . Years of Education: N/A   Occupational History  . Not on file.   Social History Main Topics  . Smoking status: Current Every Day Smoker -- 1.00 packs/day  for 40 years    Types: Cigarettes    Start date: 12/09/1965  . Smokeless tobacco: Never Used  . Alcohol Use: No  . Drug Use: No  . Sexual Activity: Yes    Birth Control/ Protection: None   Other Topics Concern  . Not on file   Social History Narrative   Pt gets regular exercise.     Filed Vitals:   02/07/15 1013  BP: 132/82  Pulse: 60  Height: 5\' 5"  (1.651 m)  Weight: 156 lb 3.2 oz (70.852 kg)  SpO2: 98%    PHYSICAL EXAM General: NAD HEENT: Normal. Neck: No JVD, no thyromegaly. Lungs: Clear to auscultation bilaterally with normal respiratory effort. CV: Nondisplaced PMI.  Regular rate and rhythm, normal  S1/S2, no S3/S4, no murmur. No pretibial or periankle edema.  No carotid bruit.  Normal pedal pulses.  Abdomen: Soft, nontender, no hepatosplenomegaly, no distention.  Neurologic: Alert and oriented x 3.  Psych: Normal affect. Skin: Normal. Musculoskeletal: Normal range of motion, no gross deformities. Extremities: No clubbing or cyanosis.   ECG: Most recent ECG reviewed.      ASSESSMENT AND PLAN: 1. CAD: Stable ischemic heart disease. Continue current therapy with ASA, Lipitor, and bisoprolol. 2. Essential HTN: Controlled on current therapy which includes amlodipine 10 mg and lisinopril 10 mg. No changes. 3. Hyperlipidemia: Will obtain copy of lipid panel. Continue Lipitor 40 mg hs. 4. Tobacco abuse history: Cessation counseling again given. 5. Ischemic cardiomyopathy: No signs of heart failure. Continue ACEI and beta blocker.  Dispo: f/u 6 months.   Kate Sable, M.D., F.A.C.C.

## 2015-04-10 ENCOUNTER — Observation Stay (HOSPITAL_COMMUNITY): Payer: Medicare Other

## 2015-04-10 ENCOUNTER — Emergency Department (HOSPITAL_COMMUNITY): Payer: Medicare Other

## 2015-04-10 ENCOUNTER — Observation Stay (HOSPITAL_COMMUNITY)
Admission: EM | Admit: 2015-04-10 | Discharge: 2015-04-12 | Disposition: A | Discharge status: Home / Self Care | Payer: Medicare Other | Attending: Internal Medicine | Admitting: Internal Medicine

## 2015-04-10 ENCOUNTER — Encounter (HOSPITAL_COMMUNITY): Payer: Self-pay | Admitting: Emergency Medicine

## 2015-04-10 DIAGNOSIS — R260 Ataxic gait: Secondary | ICD-10-CM | POA: Insufficient documentation

## 2015-04-10 DIAGNOSIS — F17200 Nicotine dependence, unspecified, uncomplicated: Secondary | ICD-10-CM | POA: Diagnosis present

## 2015-04-10 DIAGNOSIS — M47812 Spondylosis without myelopathy or radiculopathy, cervical region: Secondary | ICD-10-CM | POA: Diagnosis not present

## 2015-04-10 DIAGNOSIS — Z9861 Coronary angioplasty status: Secondary | ICD-10-CM | POA: Insufficient documentation

## 2015-04-10 DIAGNOSIS — G459 Transient cerebral ischemic attack, unspecified: Secondary | ICD-10-CM | POA: Insufficient documentation

## 2015-04-10 DIAGNOSIS — R531 Weakness: Secondary | ICD-10-CM | POA: Diagnosis not present

## 2015-04-10 DIAGNOSIS — Z79899 Other long term (current) drug therapy: Secondary | ICD-10-CM | POA: Diagnosis not present

## 2015-04-10 DIAGNOSIS — I255 Ischemic cardiomyopathy: Secondary | ICD-10-CM | POA: Diagnosis present

## 2015-04-10 DIAGNOSIS — M542 Cervicalgia: Secondary | ICD-10-CM | POA: Insufficient documentation

## 2015-04-10 DIAGNOSIS — Z7982 Long term (current) use of aspirin: Secondary | ICD-10-CM | POA: Insufficient documentation

## 2015-04-10 DIAGNOSIS — I10 Essential (primary) hypertension: Secondary | ICD-10-CM | POA: Insufficient documentation

## 2015-04-10 DIAGNOSIS — I2510 Atherosclerotic heart disease of native coronary artery without angina pectoris: Secondary | ICD-10-CM | POA: Insufficient documentation

## 2015-04-10 DIAGNOSIS — Z720 Tobacco use: Secondary | ICD-10-CM | POA: Diagnosis not present

## 2015-04-10 DIAGNOSIS — I252 Old myocardial infarction: Secondary | ICD-10-CM | POA: Diagnosis not present

## 2015-04-10 DIAGNOSIS — R51 Headache: Secondary | ICD-10-CM | POA: Diagnosis not present

## 2015-04-10 DIAGNOSIS — E780 Pure hypercholesterolemia: Secondary | ICD-10-CM | POA: Insufficient documentation

## 2015-04-10 DIAGNOSIS — R079 Chest pain, unspecified: Secondary | ICD-10-CM | POA: Diagnosis present

## 2015-04-10 DIAGNOSIS — H538 Other visual disturbances: Secondary | ICD-10-CM | POA: Diagnosis not present

## 2015-04-10 DIAGNOSIS — Z87891 Personal history of nicotine dependence: Secondary | ICD-10-CM | POA: Diagnosis not present

## 2015-04-10 DIAGNOSIS — I6523 Occlusion and stenosis of bilateral carotid arteries: Secondary | ICD-10-CM | POA: Diagnosis not present

## 2015-04-10 DIAGNOSIS — R42 Dizziness and giddiness: Secondary | ICD-10-CM | POA: Diagnosis not present

## 2015-04-10 HISTORY — DX: Spondylosis without myelopathy or radiculopathy, cervical region: M47.812

## 2015-04-10 LAB — COMPREHENSIVE METABOLIC PANEL
ALK PHOS: 45 U/L (ref 38–126)
ALT: 35 U/L (ref 17–63)
ANION GAP: 9 (ref 5–15)
AST: 33 U/L (ref 15–41)
Albumin: 3.8 g/dL (ref 3.5–5.0)
BILIRUBIN TOTAL: 0.8 mg/dL (ref 0.3–1.2)
BUN: 11 mg/dL (ref 6–20)
CHLORIDE: 107 mmol/L (ref 101–111)
CO2: 25 mmol/L (ref 22–32)
Calcium: 8.7 mg/dL — ABNORMAL LOW (ref 8.9–10.3)
Creatinine, Ser: 0.74 mg/dL (ref 0.61–1.24)
GLUCOSE: 101 mg/dL — AB (ref 65–99)
Potassium: 3.2 mmol/L — ABNORMAL LOW (ref 3.5–5.1)
Sodium: 141 mmol/L (ref 135–145)
Total Protein: 7.5 g/dL (ref 6.5–8.1)

## 2015-04-10 LAB — URINE MICROSCOPIC-ADD ON

## 2015-04-10 LAB — RAPID URINE DRUG SCREEN, HOSP PERFORMED
Amphetamines: NOT DETECTED
BENZODIAZEPINES: NOT DETECTED
Barbiturates: NOT DETECTED
Cocaine: NOT DETECTED
Opiates: NOT DETECTED
TETRAHYDROCANNABINOL: NOT DETECTED

## 2015-04-10 LAB — DIFFERENTIAL
Basophils Absolute: 0 10*3/uL (ref 0.0–0.1)
Basophils Relative: 0 % (ref 0–1)
EOS ABS: 0.2 10*3/uL (ref 0.0–0.7)
Eosinophils Relative: 3 % (ref 0–5)
LYMPHS ABS: 2.2 10*3/uL (ref 0.7–4.0)
LYMPHS PCT: 27 % (ref 12–46)
Monocytes Absolute: 0.6 10*3/uL (ref 0.1–1.0)
Monocytes Relative: 8 % (ref 3–12)
NEUTROS PCT: 62 % (ref 43–77)
Neutro Abs: 5.2 10*3/uL (ref 1.7–7.7)

## 2015-04-10 LAB — I-STAT CHEM 8, ED
BUN: 13 mg/dL (ref 6–20)
CHLORIDE: 104 mmol/L (ref 101–111)
Calcium, Ion: 1.15 mmol/L (ref 1.13–1.30)
Creatinine, Ser: 0.9 mg/dL (ref 0.61–1.24)
Glucose, Bld: 102 mg/dL — ABNORMAL HIGH (ref 65–99)
HEMATOCRIT: 48 % (ref 39.0–52.0)
Hemoglobin: 16.3 g/dL (ref 13.0–17.0)
Potassium: 4 mmol/L (ref 3.5–5.1)
Sodium: 141 mmol/L (ref 135–145)
TCO2: 25 mmol/L (ref 0–100)

## 2015-04-10 LAB — GLUCOSE, CAPILLARY
GLUCOSE-CAPILLARY: 114 mg/dL — AB (ref 65–99)
Glucose-Capillary: 116 mg/dL — ABNORMAL HIGH (ref 65–99)

## 2015-04-10 LAB — TROPONIN I: Troponin I: <0.03 ng/mL (ref <0.031)

## 2015-04-10 LAB — I-STAT TROPONIN, ED: TROPONIN I, POC: 0 ng/mL (ref 0.00–0.08)

## 2015-04-10 LAB — URINALYSIS, ROUTINE W REFLEX MICROSCOPIC
BILIRUBIN URINE: NEGATIVE
Glucose, UA: NEGATIVE mg/dL
Ketones, ur: NEGATIVE mg/dL
NITRITE: NEGATIVE
PH: 5.5 (ref 5.0–8.0)
Protein, ur: NEGATIVE mg/dL
Specific Gravity, Urine: <1.005 — ABNORMAL LOW (ref 1.005–1.030)
Urobilinogen, UA: 0.2 mg/dL (ref 0.0–1.0)

## 2015-04-10 LAB — CBC
HCT: 43 % (ref 39.0–52.0)
HEMOGLOBIN: 14.6 g/dL (ref 13.0–17.0)
MCH: 30.7 pg (ref 26.0–34.0)
MCHC: 34 g/dL (ref 30.0–36.0)
MCV: 90.5 fL (ref 78.0–100.0)
Platelets: 188 10*3/uL (ref 150–400)
RBC: 4.75 MIL/uL (ref 4.22–5.81)
RDW: 14.7 % (ref 11.5–15.5)
WBC: 8.3 10*3/uL (ref 4.0–10.5)

## 2015-04-10 LAB — PROTIME-INR
INR: 1.17 (ref 0.00–1.49)
Prothrombin Time: 15 seconds (ref 11.6–15.2)

## 2015-04-10 LAB — LIPID PANEL
CHOL/HDL RATIO: 4.1 ratio
CHOLESTEROL: 130 mg/dL (ref 0–200)
HDL: 32 mg/dL — AB (ref >40)
LDL CALC: 87 mg/dL (ref 0–99)
Triglycerides: 56 mg/dL (ref <150)
VLDL: 11 mg/dL (ref 0–40)

## 2015-04-10 LAB — ETHANOL: Alcohol, Ethyl (B): <5 mg/dL (ref <5)

## 2015-04-10 LAB — APTT: APTT: 29 s (ref 24–37)

## 2015-04-10 MED ORDER — ACETAMINOPHEN 325 MG PO TABS: 650.0000 mg | ORAL_TABLET | ORAL | Status: DC | PRN

## 2015-04-10 MED ORDER — PANTOPRAZOLE SODIUM 40 MG PO TBEC
40.0000 mg | DELAYED_RELEASE_TABLET | Freq: Every day | ORAL | Status: DC
  Administered 2015-04-11 – 2015-04-12 (×2): 40 mg via ORAL
  Filled 2015-04-10 (×2): qty 1

## 2015-04-10 MED ORDER — ENSURE ENLIVE PO LIQD
237.0000 mL | Freq: Two times a day (BID) | ORAL | Status: DC
  Administered 2015-04-10 – 2015-04-12 (×4): 237 mL via ORAL

## 2015-04-10 MED ORDER — ATORVASTATIN CALCIUM 40 MG PO TABS
40.0000 mg | ORAL_TABLET | Freq: Every day | ORAL | Status: DC
  Administered 2015-04-10 – 2015-04-11 (×2): 40 mg via ORAL
  Filled 2015-04-10 (×2): qty 1

## 2015-04-10 MED ORDER — ASPIRIN 325 MG PO TABS
325.0000 mg | ORAL_TABLET | Freq: Every day | ORAL | Status: DC
  Administered 2015-04-10 – 2015-04-12 (×3): 325 mg via ORAL
  Filled 2015-04-10 (×3): qty 1

## 2015-04-10 MED ORDER — IOHEXOL 350 MG/ML SOLN
100.0000 mL | Freq: Once | INTRAVENOUS | Status: AC | PRN
  Administered 2015-04-10: 80 mL via INTRAVENOUS

## 2015-04-10 MED ORDER — POTASSIUM CHLORIDE CRYS ER 20 MEQ PO TBCR
40.0000 meq | EXTENDED_RELEASE_TABLET | Freq: Once | ORAL | Status: AC
  Administered 2015-04-10: 40 meq via ORAL
  Filled 2015-04-10: qty 2

## 2015-04-10 MED ORDER — TEMAZEPAM 15 MG PO CAPS: 15.0000 mg | ORAL_CAPSULE | Freq: Every evening | ORAL | Status: DC | PRN

## 2015-04-10 MED ORDER — ENOXAPARIN SODIUM 40 MG/0.4ML ~~LOC~~ SOLN
40.0000 mg | Freq: Every day | SUBCUTANEOUS | Status: DC
  Administered 2015-04-10 – 2015-04-12 (×3): 40 mg via SUBCUTANEOUS
  Filled 2015-04-10 (×3): qty 0.4

## 2015-04-10 MED ORDER — HYDROCODONE-ACETAMINOPHEN 5-325 MG PO TABS: 1.0000 | ORAL_TABLET | Freq: Four times a day (QID) | ORAL | Status: DC | PRN

## 2015-04-10 MED ORDER — NITROGLYCERIN 0.4 MG SL SUBL: 0.4000 mg | SUBLINGUAL_TABLET | SUBLINGUAL | Status: DC | PRN

## 2015-04-10 MED ORDER — SODIUM CHLORIDE 0.9 % IV SOLN
INTRAVENOUS | Status: AC
  Administered 2015-04-10: 16:00:00 via INTRAVENOUS

## 2015-04-10 MED ORDER — STROKE: EARLY STAGES OF RECOVERY BOOK
Freq: Once | Status: AC
  Administered 2015-04-10: 15:00:00
  Filled 2015-04-10: qty 1

## 2015-04-10 NOTE — ED Notes (Signed)
Patient states his pain is gone but continues to experience dizziness. States "I was really dizzy when I woke up at 4:00 this morning but it felt better after I took a shower. But I went to work and started getting dizzy again so I thought I better come on over to get checked out."
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
## 2015-04-10 NOTE — ED Notes (Signed)
Pt states that while sitting in church yesterday at 1500 had sharp pain go up left side of neck into head and has been feeling dizzy with blurred vision and headache since then.  No neuro deficit noted at triage.
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
## 2015-04-10 NOTE — ED Notes (Signed)
Patient receiving teleneurology exam at this time. Nurse and spouse at bedside during exam.

## 2015-04-11 ENCOUNTER — Observation Stay (HOSPITAL_BASED_OUTPATIENT_CLINIC_OR_DEPARTMENT_OTHER): Payer: Medicare Other

## 2015-04-11 ENCOUNTER — Observation Stay (HOSPITAL_COMMUNITY): Payer: Medicare Other

## 2015-04-11 DIAGNOSIS — I2510 Atherosclerotic heart disease of native coronary artery without angina pectoris: Secondary | ICD-10-CM | POA: Diagnosis not present

## 2015-04-11 DIAGNOSIS — R42 Dizziness and giddiness: Secondary | ICD-10-CM | POA: Diagnosis not present

## 2015-04-11 DIAGNOSIS — G459 Transient cerebral ischemic attack, unspecified: Secondary | ICD-10-CM

## 2015-04-11 DIAGNOSIS — M542 Cervicalgia: Secondary | ICD-10-CM | POA: Diagnosis not present

## 2015-04-11 DIAGNOSIS — R51 Headache: Secondary | ICD-10-CM | POA: Diagnosis not present

## 2015-04-11 DIAGNOSIS — R260 Ataxic gait: Secondary | ICD-10-CM | POA: Diagnosis not present

## 2015-04-11 DIAGNOSIS — I10 Essential (primary) hypertension: Secondary | ICD-10-CM | POA: Diagnosis not present

## 2015-04-11 DIAGNOSIS — I252 Old myocardial infarction: Secondary | ICD-10-CM | POA: Diagnosis not present

## 2015-04-11 LAB — GLUCOSE, CAPILLARY
Glucose-Capillary: 103 mg/dL — ABNORMAL HIGH (ref 65–99)
Glucose-Capillary: 111 mg/dL — ABNORMAL HIGH (ref 65–99)
Glucose-Capillary: 120 mg/dL — ABNORMAL HIGH (ref 65–99)
Glucose-Capillary: 139 mg/dL — ABNORMAL HIGH (ref 65–99)

## 2015-04-11 LAB — BASIC METABOLIC PANEL
Anion gap: 6 (ref 5–15)
BUN: 14 mg/dL (ref 6–20)
CO2: 24 mmol/L (ref 22–32)
CREATININE: 0.86 mg/dL (ref 0.61–1.24)
Calcium: 8.5 mg/dL — ABNORMAL LOW (ref 8.9–10.3)
Chloride: 110 mmol/L (ref 101–111)
Glucose, Bld: 90 mg/dL (ref 65–99)
Potassium: 3.6 mmol/L (ref 3.5–5.1)
Sodium: 140 mmol/L (ref 135–145)

## 2015-04-11 LAB — TROPONIN I

## 2015-04-11 LAB — HEMOGLOBIN A1C
Hgb A1c MFr Bld: 5.9 % — ABNORMAL HIGH (ref 4.8–5.6)
MEAN PLASMA GLUCOSE: 123 mg/dL

## 2015-04-11 NOTE — Progress Notes (Signed)
Nutrition Brief Note  Patient identified on the Malnutrition Screening Tool (MST) Report  Wt Readings from Last 15 Encounters:  04/10/15 150 lb 3.2 oz (68.13 kg)  02/07/15 156 lb 3.2 oz (70.852 kg)  08/15/14 153 lb (69.4 kg)  02/25/14 156 lb (70.761 kg)  09/06/13 158 lb (71.668 kg)  08/24/13 156 lb 12 oz (71.1 kg)  08/19/13 157 lb 4 oz (71.328 kg)  06/06/13 170 lb (77.111 kg)  01/17/12 165 lb (74.844 kg)  01/09/12 165 lb 0.6 oz (74.86 kg)  01/07/12 165 lb (74.844 kg)  08/02/10 158 lb (71.668 kg)  04/13/10 160 lb (72.576 kg)    Body mass index is 24.99 kg/(m^2). Patient meets criteria for Regular weight based on current BMI.   Pt states the weight loss reported on the MST is from years ago. He says he has maintained a stable weight for the past 6 months atleast. Per chart, he may have lost ~5 lbs the past few months.   He states his appetite has just gradually declined over time, but it has nothing to do with his current admission. He denies any n/v/c/d. Does not take any supplements at home. Says he is ready to go home. Declined snacks.   Current diet order is Heart Healthy, patient is consuming approximately 100% of meals at this time. Labs and medications reviewed.   No nutrition interventions warranted at this time. If nutrition issues arise, please consult RD.   Burtis Junes RD, LDN Nutrition Pager: [phone] 04/11/2015 2:16 PM

## 2015-04-11 NOTE — Progress Notes (Signed)
OT Screen  Patient Details Name: KHI MCMILLEN MRN: [id number] DOB: [birth year]   OT Screen:     Reason evaluation not completed: Pt screened. Pt awake, alert, and oriented x4. Pt is independent in bed mobility, B/IADL tasks, and functional mobility. Pt reports no lightheaded feelings, and states he is "ready to get my papers and go." Pt is at baseline, no further OT services necessary.   Guadelupe Sabin, OTR/L  [phone]  04/11/2015, 9:14 AM

## 2015-04-11 NOTE — Progress Notes (Signed)
1120 Spoke with Bernie from echo lab regarding patient's order for 2D Echo Limited. Dr.Fanta notified to verify whether or not he wanted a limited or complete echo done on the patient. Dr.Fanta confirmed he wanted complete 2D echo to be performed on the patient. Old order d/c and new order for 2D echo complete entered as order per Dr.Fanta. Bernie with the echo lab made aware.
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
## 2015-04-11 NOTE — Progress Notes (Signed)
PT screen completed this morning.  Patient found ambulating around his room; states that he is not having any symptoms and demonstrated no balance deficits at this time. Ambulated approximately 52ft with no device and on independent basis, no concerns noted for balance, safety, or activity tolerance.  At this time patient displays no significant deficits and is not in need of any skilled PT services. After being discharged by MD, he should be able to easily return to his prior residence and level of function with no further concerns from PT perspective.   Deniece Ree PT, DPT [phone]

## 2015-04-11 NOTE — Consult Note (Signed)
[place] A. Merlene Laughter, MD     [email or web address]          Ronald Mcdowell is an 69 y.o. male.   ASSESSMENT/PLAN: 1. Acute vertiginous symptoms lasting for about 30 minutes. The exact etiology is unclear but posterior circulation TIAs a possibility. The patient has had these spells in the past and therefore he can have it in a problem. Approximately vertiginous symptoms, there are no neurological associated symptoms to suggest a TIA and probably the most likely etiology is from an inner ear problem. He does have several risk factors however. We'll therefore have the patient increase his aspirin to at least 162 mg to 325 mg a day. Continue with other risk factor modifications. 2. Left occipital headaches that are brief. Location seem consistent with occipital neuralgia type headaches. There are infrequent and I think we should just observe for now. 3. Coronary artery disease. 4. Hypertension. 5. Ischemic cardiomyopathy. 6. Evidence of peripheral neuropathy on examination. This is most likely due to borderline diabetes/prediabetes with a hemoglobin A1c of 5.9. Additional labs will be obtained however. The patient is a 69 year old black male who was at church when involved acute severe left occipital headaches which radiated to the anterior part of the head. The event lasted for only about 5 seconds. The patient tells me he was quite severe. He has had these shooting headaches in the past but they're occur infrequently. The patient decided to go to work and developed the acute onset of vertiginous symptoms described as a sensation of disequilibrium. The event lasted for about 30 minutes. The patient tells me that he has had these episodes in the past when he was living in [place]. In fact, he tells me that the vertiginous symptoms with actual last several hours. He has been worked up in the past and tells me that the workup has been negative. The patient denies associated symptoms  with the vertiginous symptoms. He denies any spinning sensation, dysarthria, dysphagia, chest pain, she also breath, focal numbness or weakness. The review of systems otherwise negative.  GENERAL: This very pleasant man in no acute distress.  HEENT: Supple. Atraumatic normocephalic.   ABDOMEN: soft  EXTREMITIES: No edema   BACK: Normal.  SKIN: Normal by inspection.    MENTAL STATUS: Alert and oriented. Speech, language and cognition are generally intact. Judgment and insight normal.   CRANIAL NERVES: Pupils are equal, round and reactive to light and accommodation; extra ocular movements are full, there is no significant nystagmus; visual fields are full; upper and lower facial muscles are normal in strength and symmetric, there is no flattening of the nasolabial folds; tongue is midline; uvula is midline; shoulder elevation is normal.  MOTOR: Normal tone, bulk and strength; no pronator drift.  COORDINATION: Left finger to nose is normal, right finger to nose is normal, No rest tremor; no intention tremor; no postural tremor; no bradykinesia.  REFLEXES: Deep tendon reflexes are symmetrical and normal in the upper extremities but diminished in the legs. Babinski reflexes are flexor bilaterally.   SENSATION: Normal to light touch.   Blood pressure 121/65, pulse 58, temperature 98.1 F (36.7 C), temperature source Oral, resp. rate 18, height 5' 5" (1.651 m), weight 68.13 kg (150 lb 3.2 oz), SpO2 100 %.  Past Medical History  Diagnosis Date  . Hypertension   . Elevated cholesterol   . Coronary artery disease     a. h/o MI in 2000 w/ prior LCX stenting;  b. 7.2014 Abnl Cardiolite, EF  41%, mod-large inferolat scar;  c. 07/2013 Cath/PCI: LM nl, LAD 10-20, D1 40-50p, LCX 95-99 @ distal stent margin (2.5x20 Promus Premier DES), RCA dom, 40p, 65m 70d, EF 35-40%.  . Tobacco user   . Ischemic cardiomyopathy     a. 07/2013 EF 35-40% by LV gram.  . Spondylosis of cervical joint     C3-4, C6-7     Past Surgical History  Procedure Laterality Date  . Coronary stent placement  2000  . Cardiac catheterization    . Transurethral resection of prostate  01/09/2012    Procedure: TRANSURETHRAL RESECTION OF THE PROSTATE (TURP);  Surgeon: MMarissa Nestle MD;  Location: [facility];  Service: Urology;  Laterality: N/A;  . Coronary angioplasty with stent placement  08/23/2013    mid circumflex  DES    by Dr JMartinique . Left heart catheterization with coronary angiogram N/A 08/23/2013    Procedure: LEFT HEART CATHETERIZATION WITH CORONARY ANGIOGRAM;  Surgeon: Peter M JMartinique MD;  [facility];  Service: Cardiovascular;  Laterality: N/A;    Family History  Problem Relation Age of Onset  . Anesthesia problems Neg Hx   . Hypotension Neg Hx   . Malignant hyperthermia Neg Hx   . Pseudochol deficiency Neg Hx     Social History:  reports that he has been smoking Cigarettes.  He started smoking about 49 years ago. He has a 40 pack-year smoking history. He has never used smokeless tobacco. He reports that he does not drink alcohol or use illicit drugs.  Allergies: No Known Allergies  Medications: Prior to Admission medications   Medication Sig Start Date End Date Taking? Authorizing Provider  amLODipine (NORVASC) 10 MG tablet Take 10 mg by mouth daily.     Yes Historical Provider, MD  aspirin EC 81 MG EC tablet Take 1 tablet (81 mg total) by mouth daily. 06/07/13  Yes JKathie Dike MD  atorvastatin (LIPITOR) 40 MG tablet Take 40 mg by mouth at bedtime.  08/09/13  Yes Historical Provider, MD  bisoprolol (ZEBETA) 10 MG tablet Take 0.5 tablets (5 mg total) by mouth daily. 06/07/13  Yes JKathie Dike MD  lisinopril (PRINIVIL,ZESTRIL) 10 MG tablet Take 10 mg by mouth daily.  02/14/14  Yes Historical Provider, MD  nitroGLYCERIN (NITROSTAT) 0.4 MG SL tablet Place 1 tablet (0.4 mg total) under the tongue every 5 (five) minutes as needed for chest pain. 08/24/13  Yes CRogelia Mire NP    omeprazole (PRILOSEC) 20 MG capsule Take 20 mg by mouth daily.  02/03/15  Yes Historical Provider, MD    Scheduled Meds: . aspirin  325 mg Oral Daily  . atorvastatin  40 mg Oral QHS  . enoxaparin (LOVENOX) injection  40 mg Subcutaneous Daily  . feeding supplement (ENSURE ENLIVE)  237 mL Oral BID BM  . pantoprazole  40 mg Oral Daily   Continuous Infusions:  PRN Meds:.acetaminophen, HYDROcodone-acetaminophen, nitroGLYCERIN, temazepam     Results for orders placed or performed during the hospital encounter of 04/10/15 (from the past 48 hour(s))  [facility] troponin[facility]     Status: None   Collection Time: 04/10/15  7:57 AM  Result Value Ref Range   Troponin i, poc 0.00 0.00 - 0.08 ng/mL   Comment 3            Comment: Due to the release kinetics of cTnI, a negative result within the first hours of the onset of symptoms does not rule out myocardial infarction  with certainty. If myocardial infarction is still suspected, repeat the test at appropriate intervals.   I-Stat Chem 8, ED  (not at [facility])     Status: Abnormal   Collection Time: 04/10/15  7:58 AM  Result Value Ref Range   Sodium 141 135 - 145 mmol/L   Potassium 4.0 3.5 - 5.1 mmol/L   Chloride 104 101 - 111 mmol/L   BUN 13 6 - 20 mg/dL   Creatinine, Ser 0.90 0.61 - 1.24 mg/dL   Glucose, Bld 102 (H) 65 - 99 mg/dL   Calcium, Ion 1.15 1.13 - 1.30 mmol/L   TCO2 25 0 - 100 mmol/L   Hemoglobin 16.3 13.0 - 17.0 g/dL   HCT 48.0 39.0 - 52.0 %  Hemoglobin A1c     Status: Abnormal   Collection Time: 04/10/15  8:19 AM  Result Value Ref Range   Hgb A1c MFr Bld 5.9 (H) 4.8 - 5.6 %    Comment: (NOTE)         Pre-diabetes: 5.7 - 6.4         Diabetes: >6.4         Glycemic control for adults with diabetes: <7.0    Mean Plasma Glucose 123 mg/dL    Comment: (NOTE) Performed At: [facility] [place], [place] [id number] Lindon Romp MD OV:[id number]   Fasting lipid panel     Status:  Abnormal   Collection Time: 04/10/15  8:19 AM  Result Value Ref Range   Cholesterol 130 0 - 200 mg/dL   Triglycerides 56 <150 mg/dL   HDL 32 (L) >40 mg/dL   Total CHOL/HDL Ratio 4.1 RATIO   VLDL 11 0 - 40 mg/dL   LDL Cholesterol 87 0 - 99 mg/dL    Comment:        Total Cholesterol/HDL:CHD Risk Coronary Heart Disease Risk Table                     Men   Women  1/2 Average Risk   3.4   3.3  Average Risk       5.0   4.4  2 X Average Risk   9.6   7.1  3 X Average Risk  23.4   11.0        Use the calculated Patient Ratio above and the CHD Risk Table to determine the patient's CHD Risk.        ATP III CLASSIFICATION (LDL):  <100     mg/dL   Optimal  100-129  mg/dL   Near or Above                    Optimal  130-159  mg/dL   Borderline  160-189  mg/dL   High  >190     mg/dL   Very High   Ethanol     Status: None   Collection Time: 04/10/15  8:25 AM  Result Value Ref Range   Alcohol, Ethyl (B) <5 <5 mg/dL    Comment:        LOWEST DETECTABLE LIMIT FOR SERUM ALCOHOL IS 11 mg/dL FOR MEDICAL PURPOSES ONLY   Protime-INR     Status: None   Collection Time: 04/10/15  8:25 AM  Result Value Ref Range   Prothrombin Time 15.0 11.6 - 15.2 seconds   INR 1.17 0.00 - 1.49  APTT     Status: None   Collection Time: 04/10/15  8:25 AM  Result Value  Ref Range   aPTT 29 24 - 37 seconds  CBC     Status: None   Collection Time: 04/10/15  8:25 AM  Result Value Ref Range   WBC 8.3 4.0 - 10.5 K/uL   RBC 4.75 4.22 - 5.81 MIL/uL   Hemoglobin 14.6 13.0 - 17.0 g/dL   HCT 43.0 39.0 - 52.0 %   MCV 90.5 78.0 - 100.0 fL   MCH 30.7 26.0 - 34.0 pg   MCHC 34.0 30.0 - 36.0 g/dL   RDW 14.7 11.5 - 15.5 %   Platelets 188 150 - 400 K/uL  Differential     Status: None   Collection Time: 04/10/15  8:25 AM  Result Value Ref Range   Neutrophils Relative % 62 43 - 77 %   Neutro Abs 5.2 1.7 - 7.7 K/uL   Lymphocytes Relative 27 12 - 46 %   Lymphs Abs 2.2 0.7 - 4.0 K/uL   Monocytes Relative 8 3 - 12 %    Monocytes Absolute 0.6 0.1 - 1.0 K/uL   Eosinophils Relative 3 0 - 5 %   Eosinophils Absolute 0.2 0.0 - 0.7 K/uL   Basophils Relative 0 0 - 1 %   Basophils Absolute 0.0 0.0 - 0.1 K/uL  Comprehensive metabolic panel     Status: Abnormal   Collection Time: 04/10/15  8:25 AM  Result Value Ref Range   Sodium 141 135 - 145 mmol/L   Potassium 3.2 (L) 3.5 - 5.1 mmol/L    Comment: DELTA CHECK NOTED   Chloride 107 101 - 111 mmol/L   CO2 25 22 - 32 mmol/L   Glucose, Bld 101 (H) 65 - 99 mg/dL   BUN 11 6 - 20 mg/dL   Creatinine, Ser 0.74 0.61 - 1.24 mg/dL   Calcium 8.7 (L) 8.9 - 10.3 mg/dL   Total Protein 7.5 6.5 - 8.1 g/dL   Albumin 3.8 3.5 - 5.0 g/dL   AST 33 15 - 41 U/L   ALT 35 17 - 63 U/L   Alkaline Phosphatase 45 38 - 126 U/L   Total Bilirubin 0.8 0.3 - 1.2 mg/dL   GFR calc non Af Amer >60 >60 mL/min   GFR calc Af Amer >60 >60 mL/min    Comment: (NOTE) The eGFR has been calculated using the CKD EPI equation. This calculation has not been validated in all clinical situations. eGFR's persistently <60 mL/min signify possible Chronic Kidney Disease.    Anion gap 9 5 - 15  Urine Drug Screen     Status: None   Collection Time: 04/10/15  8:30 AM  Result Value Ref Range   Opiates NONE DETECTED NONE DETECTED   Cocaine NONE DETECTED NONE DETECTED   Benzodiazepines NONE DETECTED NONE DETECTED   Amphetamines NONE DETECTED NONE DETECTED   Tetrahydrocannabinol NONE DETECTED NONE DETECTED   Barbiturates NONE DETECTED NONE DETECTED    Comment:        DRUG SCREEN FOR MEDICAL PURPOSES ONLY.  IF CONFIRMATION IS NEEDED FOR ANY PURPOSE, NOTIFY LAB WITHIN 5 DAYS.        LOWEST DETECTABLE LIMITS FOR URINE DRUG SCREEN Drug Class       Cutoff (ng/mL) Amphetamine      1000 Barbiturate      200 Benzodiazepine   200 Tricyclics       300 Opiates          300 Cocaine          300 THC                50   Urinalysis, Routine w reflex microscopic     Status: Abnormal   Collection Time: 04/10/15   8:30 AM  Result Value Ref Range   Color, Urine YELLOW YELLOW   APPearance CLEAR CLEAR   Specific Gravity, Urine <1.005 (L) 1.005 - 1.030   pH 5.5 5.0 - 8.0   Glucose, UA NEGATIVE NEGATIVE mg/dL   Hgb urine dipstick TRACE (A) NEGATIVE   Bilirubin Urine NEGATIVE NEGATIVE   Ketones, ur NEGATIVE NEGATIVE mg/dL   Protein, ur NEGATIVE NEGATIVE mg/dL   Urobilinogen, UA 0.2 0.0 - 1.0 mg/dL   Nitrite NEGATIVE NEGATIVE   Leukocytes, UA SMALL (A) NEGATIVE  Urine microscopic-add on     Status: None   Collection Time: 04/10/15  8:30 AM  Result Value Ref Range   WBC, UA 3-6 <3 WBC/hpf   RBC / HPF 0-2 <3 RBC/hpf  Troponin I     Status: None   Collection Time: 04/10/15  3:41 PM  Result Value Ref Range   Troponin I <0.03 <0.031 ng/mL    Comment:        NO INDICATION OF MYOCARDIAL INJURY.   Glucose, capillary     Status: Abnormal   Collection Time: 04/10/15  4:40 PM  Result Value Ref Range   Glucose-Capillary 116 (H) 65 - 99 mg/dL  Glucose, capillary     Status: Abnormal   Collection Time: 04/10/15 10:27 PM  Result Value Ref Range   Glucose-Capillary 114 (H) 65 - 99 mg/dL  Troponin I     Status: None   Collection Time: 04/11/15  6:25 AM  Result Value Ref Range   Troponin I <0.03 <0.031 ng/mL    Comment:        NO INDICATION OF MYOCARDIAL INJURY.   Basic metabolic panel     Status: Abnormal   Collection Time: 04/11/15  6:25 AM  Result Value Ref Range   Sodium 140 135 - 145 mmol/L   Potassium 3.6 3.5 - 5.1 mmol/L   Chloride 110 101 - 111 mmol/L   CO2 24 22 - 32 mmol/L   Glucose, Bld 90 65 - 99 mg/dL   BUN 14 6 - 20 mg/dL   Creatinine, Ser 0.86 0.61 - 1.24 mg/dL   Calcium 8.5 (L) 8.9 - 10.3 mg/dL   GFR calc non Af Amer >60 >60 mL/min   GFR calc Af Amer >60 >60 mL/min    Comment: (NOTE) The eGFR has been calculated using the CKD EPI equation. This calculation has not been validated in all clinical situations. eGFR's persistently <60 mL/min signify possible Chronic  Kidney Disease.    Anion gap 6 5 - 15  Glucose, capillary     Status: Abnormal   Collection Time: 04/11/15  7:44 AM  Result Value Ref Range   Glucose-Capillary 103 (H) 65 - 99 mg/dL  Glucose, capillary     Status: Abnormal   Collection Time: 04/11/15 12:01 PM  Result Value Ref Range   Glucose-Capillary 111 (H) 65 - 99 mg/dL   Comment 1 Notify RN    Comment 2 Document in Chart   Glucose, capillary     Status: Abnormal   Collection Time: 04/11/15  5:06 PM  Result Value Ref Range   Glucose-Capillary 120 (H) 65 - 99 mg/dL   Comment 1 Notify RN    Comment 2 Document in Chart     Studies/Results:  BRAIN  MRI The patient tolerated the study well.  No restricted diffusion to suggest acute infarction.   No midline shift, mass effect, evidence of mass lesion, ventriculomegaly, extra-axial collection or acute intracranial hemorrhage. Cervicomedullary junction and pituitary are within normal limits. Major intracranial vascular flow voids are within normal limits. Cerebral volume is within normal limits for age. Negative visualized cervical spine.  Mild for age scattered nonspecific cerebral white matter T2 and FLAIR hyperintensity. No cortical encephalomalacia. No chronic blood products identified in the brain. Deep gray matter nuclei, brainstem and cerebellum are within normal limits.  Visible internal auditory structures appear normal. Trace mastoid fluid. Negative nasopharynx. Trace paranasal sinus mucosal thickening. Visualized orbit soft tissues are within normal limits. Visualized scalp soft tissues are within normal limits.  IMPRESSION: 1. No acute intracranial abnormality. 2. Mild for age nonspecific cerebral white matter signal changes.   BRAIN CTA/ NECK CTA No evidence of carotid or vertebral artery dissection or high-grade stenosis. Mild atherosclerotic type changes right carotid bifurcation, cavernous segment internal carotid artery bilaterally, origin of the  left subclavian artery, aortic arch and distal right vertebral artery.  No CT evidence of large acute infarct. No intracranial hemorrhage. Cervical spondylotic changes C3-4 thru C6-7. He is a   ECHO - Mildly dilated LV chamber size with LVEF 45-50%, inferior and lateral wall motion abnormalities as outlined consistent with ischemic cardiomyopathy. Indeterminate diastolic function. Moderate left atrial enlargement. Sclerotic aortic valve. Trivial mitral and tricuspid regurgitation. Unable to assess PASP.  Kofi A. [place], M.D.  Diplomate, American Board of Psychiatry and Neurology ( Neurology). 04/11/2015, 5:55 PM       

## 2015-04-11 NOTE — Care Management Note (Signed)
Case Management Note  Patient Details  Name: Ronald Mcdowell MRN: [id number] Date of Birth: 1946-01-03  Subjective/Objective:                  Pt admitted from home with TIA. Pt lives with his wife and will return home at discharge. Pt is independent with ADL's.  Action/Plan: No Cm needs noted. Awaiting neuro consult.  Expected Discharge Date:                  Expected Discharge Plan:  Home/Self Care  In-House Referral:  NA  Discharge planning Services  CM Consult  Post Acute Care Choice:  NA Choice offered to:  NA  DME Arranged:    DME Agency:     HH Arranged:    HH Agency:     Status of Service:  Completed, signed off  Medicare Important Message Given:    Date Medicare IM Given:    Medicare IM give by:    Date Additional Medicare IM Given:    Additional Medicare Important Message give by:     If discussed at [place] of Stay Meetings, dates discussed:    Additional Comments:  Joylene Draft, RN 04/11/2015, 3:22 PM

## 2015-04-12 DIAGNOSIS — G459 Transient cerebral ischemic attack, unspecified: Secondary | ICD-10-CM | POA: Diagnosis not present

## 2015-04-12 DIAGNOSIS — I10 Essential (primary) hypertension: Secondary | ICD-10-CM | POA: Diagnosis not present

## 2015-04-12 DIAGNOSIS — I2510 Atherosclerotic heart disease of native coronary artery without angina pectoris: Secondary | ICD-10-CM | POA: Diagnosis not present

## 2015-04-12 LAB — VITAMIN B12: Vitamin B-12: 298 pg/mL (ref 180–914)

## 2015-04-12 LAB — TSH: TSH: 0.674 u[IU]/mL (ref 0.350–4.500)

## 2015-04-12 MED ORDER — ASPIRIN 81 MG PO TBEC: 162.0000 mg | DELAYED_RELEASE_TABLET | Freq: Every day | ORAL | Status: DC

## 2015-04-12 NOTE — Care Management Note (Signed)
Case Management Note  Patient Details  Name: Ronald Mcdowell MRN: [id number] Date of Birth: 1946/02/13  Subjective/Objective:                    Action/Plan:   Expected Discharge Date:                  Expected Discharge Plan:  Home/Self Care  In-House Referral:  NA  Discharge planning Services  CM Consult  Post Acute Care Choice:  NA Choice offered to:  NA  DME Arranged:    DME Agency:     HH Arranged:    HH Agency:     Status of Service:  Completed, signed off  Medicare Important Message Given:    Date Medicare IM Given:    Medicare IM give by:    Date Additional Medicare IM Given:    Additional Medicare Important Message give by:     If discussed at [place] of Stay Meetings, dates discussed:    Additional Comments: Pt discharged home today. No CM needs noted. Christinia Gully [place], RN 04/12/2015, 8:51 AM

## 2015-04-12 NOTE — Progress Notes (Signed)
Pt's IV catheter removed and intact. Pt's IV site clean dry and intact. Discharge instructions reviewed and discussed with patient. Prescriptions reviewed and discussed with patient. All questions were answered and no further questions at this time. Pt in stable condition and in no acute distress at time of discharge. Pt escorted by nurse tech.
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
## 2015-04-14 LAB — HOMOCYSTEINE: HOMOCYSTEINE-NORM: 10.6 umol/L (ref 0.0–15.0)

## 2015-05-05 DIAGNOSIS — Z720 Tobacco use: Secondary | ICD-10-CM | POA: Diagnosis not present

## 2015-05-05 DIAGNOSIS — E780 Pure hypercholesterolemia: Secondary | ICD-10-CM | POA: Diagnosis not present

## 2015-05-05 DIAGNOSIS — Z8249 Family history of ischemic heart disease and other diseases of the circulatory system: Secondary | ICD-10-CM | POA: Diagnosis not present

## 2015-05-05 DIAGNOSIS — I10 Essential (primary) hypertension: Secondary | ICD-10-CM | POA: Diagnosis not present

## 2015-05-05 DIAGNOSIS — I252 Old myocardial infarction: Secondary | ICD-10-CM | POA: Diagnosis not present

## 2015-05-05 DIAGNOSIS — R109 Unspecified abdominal pain: Secondary | ICD-10-CM | POA: Diagnosis not present

## 2015-05-05 DIAGNOSIS — S336XXA Sprain of sacroiliac joint, initial encounter: Secondary | ICD-10-CM | POA: Diagnosis not present

## 2015-05-05 DIAGNOSIS — M545 Low back pain: Secondary | ICD-10-CM | POA: Diagnosis not present

## 2015-05-05 DIAGNOSIS — Z79899 Other long term (current) drug therapy: Secondary | ICD-10-CM | POA: Diagnosis not present

## 2015-05-05 DIAGNOSIS — X58XXXA Exposure to other specified factors, initial encounter: Secondary | ICD-10-CM | POA: Diagnosis not present

## 2015-05-09 DIAGNOSIS — I10 Essential (primary) hypertension: Secondary | ICD-10-CM | POA: Diagnosis not present

## 2015-05-09 DIAGNOSIS — A5601 Chlamydial cystitis and urethritis: Secondary | ICD-10-CM | POA: Diagnosis not present

## 2015-05-09 DIAGNOSIS — R360 Urethral discharge without blood: Secondary | ICD-10-CM | POA: Diagnosis not present

## 2015-08-15 DIAGNOSIS — I10 Essential (primary) hypertension: Secondary | ICD-10-CM | POA: Diagnosis not present

## 2015-08-15 DIAGNOSIS — E780 Pure hypercholesterolemia: Secondary | ICD-10-CM | POA: Diagnosis not present

## 2015-08-15 DIAGNOSIS — I2510 Atherosclerotic heart disease of native coronary artery without angina pectoris: Secondary | ICD-10-CM | POA: Diagnosis not present

## 2015-08-15 DIAGNOSIS — R109 Unspecified abdominal pain: Secondary | ICD-10-CM | POA: Diagnosis not present

## 2015-08-15 DIAGNOSIS — Z23 Encounter for immunization: Secondary | ICD-10-CM | POA: Diagnosis not present

## 2015-08-15 DIAGNOSIS — F17290 Nicotine dependence, other tobacco product, uncomplicated: Secondary | ICD-10-CM | POA: Diagnosis not present

## 2015-08-15 DIAGNOSIS — I259 Chronic ischemic heart disease, unspecified: Secondary | ICD-10-CM | POA: Diagnosis not present

## 2015-08-15 DIAGNOSIS — F17200 Nicotine dependence, unspecified, uncomplicated: Secondary | ICD-10-CM | POA: Diagnosis not present

## 2015-08-15 DIAGNOSIS — R1310 Dysphagia, unspecified: Secondary | ICD-10-CM | POA: Diagnosis not present

## 2015-08-20 DIAGNOSIS — T18198A Other foreign object in esophagus causing other injury, initial encounter: Secondary | ICD-10-CM | POA: Diagnosis not present

## 2015-08-20 DIAGNOSIS — E780 Pure hypercholesterolemia: Secondary | ICD-10-CM | POA: Diagnosis not present

## 2015-08-20 DIAGNOSIS — F17200 Nicotine dependence, unspecified, uncomplicated: Secondary | ICD-10-CM | POA: Diagnosis not present

## 2015-08-20 DIAGNOSIS — Z79899 Other long term (current) drug therapy: Secondary | ICD-10-CM | POA: Diagnosis not present

## 2015-08-20 DIAGNOSIS — Z7982 Long term (current) use of aspirin: Secondary | ICD-10-CM | POA: Diagnosis not present

## 2015-08-20 DIAGNOSIS — Z9889 Other specified postprocedural states: Secondary | ICD-10-CM | POA: Diagnosis not present

## 2015-08-20 DIAGNOSIS — Z8249 Family history of ischemic heart disease and other diseases of the circulatory system: Secondary | ICD-10-CM | POA: Diagnosis not present

## 2015-08-20 DIAGNOSIS — I252 Old myocardial infarction: Secondary | ICD-10-CM | POA: Diagnosis not present

## 2015-08-20 DIAGNOSIS — I10 Essential (primary) hypertension: Secondary | ICD-10-CM | POA: Diagnosis not present

## 2015-08-20 DIAGNOSIS — T189XXA Foreign body of alimentary tract, part unspecified, initial encounter: Secondary | ICD-10-CM | POA: Diagnosis not present

## 2015-08-24 ENCOUNTER — Ambulatory Visit (INDEPENDENT_AMBULATORY_CARE_PROVIDER_SITE_OTHER): Payer: Medicare Other | Admitting: Cardiovascular Disease

## 2015-08-24 ENCOUNTER — Encounter: Payer: Self-pay | Admitting: Cardiovascular Disease

## 2015-08-24 VITALS — BP 108/60 | HR 68 | Ht 65.0 in | Wt 138.8 lb

## 2015-08-24 DIAGNOSIS — I519 Heart disease, unspecified: Secondary | ICD-10-CM

## 2015-08-24 DIAGNOSIS — I2510 Atherosclerotic heart disease of native coronary artery without angina pectoris: Secondary | ICD-10-CM | POA: Diagnosis not present

## 2015-08-24 DIAGNOSIS — Z716 Tobacco abuse counseling: Secondary | ICD-10-CM | POA: Diagnosis not present

## 2015-08-24 DIAGNOSIS — I255 Ischemic cardiomyopathy: Secondary | ICD-10-CM | POA: Diagnosis not present

## 2015-08-24 DIAGNOSIS — I10 Essential (primary) hypertension: Secondary | ICD-10-CM | POA: Diagnosis not present

## 2015-08-24 DIAGNOSIS — E785 Hyperlipidemia, unspecified: Secondary | ICD-10-CM

## 2015-08-24 NOTE — Patient Instructions (Signed)

## 2015-08-24 NOTE — Progress Notes (Signed)
Patient ID: Ronald Mcdowell, male   DOB: 04-06-1946, 69 y.o.   MRN: [id number]      SUBJECTIVE: The patient presents for routine cardiovascular followup. He has a history of coronary artery disease with stenting of the left circumflex coronary artery in September 2014. At that time he was noted to have significant RCA disease, with a 40% stenosis in the proximal vessel followed by a long 80% stenosis in the proximal to mid vessel. The distal vessel was diffusely diseased to 70%. It was noted at that time that if he were to have recalcitrant chest pain refractory to medical therapy, the RCA would require extensive stenting. He also has hypertension, hyperlipidemia, tobacco abuse, and an ischemic cardiomyopathy.  Echocardiogram on 04/11/15 showed mild left ventricular dilatation with mildly reduced left ventricular systolic function, EF 78-58% , inferior and lateral wall motion abnormalities, and insignificant valvular regurgitation with moderate left atrial dilatation.  The patient denies any symptoms of chest pain, palpitations, shortness of breath, lightheadedness, dizziness, leg swelling, orthopnea, PND, and syncope. He continues to smoke 1 ppd but wants to quit.  Has had fairly significant weight loss. Had previously had sensations of "burning in stomach". Now taking meds and appetite has improved and symptoms have resolved. Denies hematochezia/melena.   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 Lived in [place] for over 60 yrs, moved to [facility] in 2009.   Review of Systems: As per "subjective", otherwise negative.  No Known Allergies  Current Outpatient Prescriptions  Medication Sig Dispense Refill  . amLODipine (NORVASC) 10 MG tablet Take 10 mg by mouth daily.      [name] aspirin 81 MG EC tablet Take 2 tablets (162 mg total) by mouth daily. 30 tablet 1  . atorvastatin (LIPITOR) 40 MG tablet Take 40 mg by mouth at bedtime.     . bisoprolol (ZEBETA) 10 MG tablet Take  0.5 tablets (5 mg total) by mouth daily.    [name] lisinopril (PRINIVIL,ZESTRIL) 10 MG tablet Take 10 mg by mouth daily.     . nitroGLYCERIN (NITROSTAT) 0.4 MG SL tablet Place 1 tablet (0.4 mg total) under the tongue every 5 (five) minutes as needed for chest pain. 25 tablet 3  . omeprazole (PRILOSEC) 20 MG capsule Take 20 mg by mouth daily.      No current facility-administered medications for this visit.    Past Medical History  Diagnosis Date  . Hypertension   . Elevated cholesterol   . Coronary artery disease     a. h/o MI in 2000 w/ prior LCX stenting;  b. 7.2014 Abnl Cardiolite, EF 41%, mod-large inferolat scar;  c. 07/2013 Cath/PCI: LM nl, LAD 10-20, D1 40-50p, LCX 95-99 @ distal stent margin (2.5x20 Promus Premier DES), RCA dom, 40p, 22m, 70d, EF 35-40%.  . Tobacco user   . Ischemic cardiomyopathy     a. 07/2013 EF 35-40% by LV gram.  . Spondylosis of cervical joint     C3-4, C6-7    Past Surgical History  Procedure Laterality Date  . Coronary stent placement  2000  . Cardiac catheterization    . Transurethral resection of prostate  01/09/2012    Procedure: TRANSURETHRAL RESECTION OF THE PROSTATE (TURP);  Surgeon: Marissa Nestle, MD;  Location: [facility];  Service: Urology;  Laterality: N/A;  . Coronary angioplasty with stent placement  08/23/2013    mid circumflex  DES    by Dr Martinique  . Left heart catheterization with coronary angiogram  N/A 08/23/2013    Procedure: LEFT HEART CATHETERIZATION WITH CORONARY ANGIOGRAM;  Surgeon: Peter M Martinique, MD;  Location: [facility] CATH LAB;  Service: Cardiovascular;  Laterality: N/A;    Social History   Social History  . Marital Status: Married    Spouse Name: N/A  . Number of Children: N/A  . Years of Education: N/A   Occupational History  . Not on file.   Social History Main Topics  . Smoking status: Current Every Day Smoker -- 1.00 packs/day for 40 years    Types: Cigarettes    Start date: 12/09/1965  . Smokeless tobacco: Never Used  .  Alcohol Use: No  . Drug Use: No  . Sexual Activity: Yes    Birth Control/ Protection: None   Other Topics Concern  . Not on file   Social History Narrative   Pt gets regular exercise.     Filed Vitals:   08/24/15 1039  BP: 108/60  Pulse: 68  Height: 5\' 5"  (1.651 m)  Weight: 138 lb 12.8 oz (62.959 kg)  SpO2: 98%    PHYSICAL EXAM General: NAD HEENT: Normal. Neck: No JVD, no thyromegaly. Lungs: Clear to auscultation bilaterally with normal respiratory effort. CV: Nondisplaced PMI.  Regular rate and rhythm, normal S1/S2, no S3/S4, no murmur. No pretibial or periankle edema.  No carotid bruit.  Normal pedal pulses.  Abdomen: Soft, nontender, no hepatosplenomegaly, no distention.  Neurologic: Alert and oriented x 3.  Psych: Normal affect. Skin: Normal. Musculoskeletal: Normal range of motion, no gross deformities. Extremities: No clubbing or cyanosis.   ECG: Most recent ECG reviewed.      ASSESSMENT AND PLAN: 1. CAD: Stable ischemic heart disease. Continue current therapy with ASA, Lipitor, and bisoprolol.  2. Essential HTN: Controlled on current therapy which includes amlodipine 10 mg and lisinopril 10 mg. No changes.  3. Hyperlipidemia: Lipid panel on 04/10/15 showed TC 130, TG 56, HDL 32, LDL 87. Continue Lipitor 40 mg hs.  4. Tobacco abuse history: Cessation counseling again given (2 minutes). Says he wants to quit.  5. Ischemic cardiomyopathy: No signs of heart failure. Continue ACEI and beta blocker.  Dispo: f/u 6 months.   Kate Sable, M.D., F.A.C.C.

## 2015-10-18 ENCOUNTER — Other Ambulatory Visit (HOSPITAL_COMMUNITY): Payer: Self-pay | Admitting: Internal Medicine

## 2015-10-18 DIAGNOSIS — R109 Unspecified abdominal pain: Secondary | ICD-10-CM

## 2015-10-23 DIAGNOSIS — I10 Essential (primary) hypertension: Secondary | ICD-10-CM | POA: Diagnosis not present

## 2015-10-23 DIAGNOSIS — I2510 Atherosclerotic heart disease of native coronary artery without angina pectoris: Secondary | ICD-10-CM | POA: Diagnosis not present

## 2015-10-23 DIAGNOSIS — F17290 Nicotine dependence, other tobacco product, uncomplicated: Secondary | ICD-10-CM | POA: Diagnosis not present

## 2015-10-23 DIAGNOSIS — E784 Other hyperlipidemia: Secondary | ICD-10-CM | POA: Diagnosis not present

## 2015-10-23 DIAGNOSIS — R1310 Dysphagia, unspecified: Secondary | ICD-10-CM | POA: Diagnosis not present

## 2015-10-23 DIAGNOSIS — R945 Abnormal results of liver function studies: Secondary | ICD-10-CM | POA: Diagnosis not present

## 2015-10-30 ENCOUNTER — Ambulatory Visit (HOSPITAL_COMMUNITY)
Admission: RE | Admit: 2015-10-30 | Discharge: 2015-10-30 | Disposition: A | Discharge status: Home / Self Care | Payer: Medicare Other | Source: Ambulatory Visit | Attending: Internal Medicine | Admitting: Internal Medicine

## 2015-10-30 DIAGNOSIS — N281 Cyst of kidney, acquired: Secondary | ICD-10-CM | POA: Insufficient documentation

## 2015-10-30 DIAGNOSIS — K7689 Other specified diseases of liver: Secondary | ICD-10-CM | POA: Insufficient documentation

## 2015-10-30 DIAGNOSIS — R109 Unspecified abdominal pain: Secondary | ICD-10-CM | POA: Insufficient documentation

## 2015-11-01 ENCOUNTER — Encounter (HOSPITAL_COMMUNITY): Payer: Self-pay | Admitting: *Deleted

## 2015-11-01 ENCOUNTER — Emergency Department (HOSPITAL_COMMUNITY)
Admission: EM | Admit: 2015-11-01 | Discharge: 2015-11-01 | Disposition: A | Discharge status: Home / Self Care | Payer: Medicare Other | Attending: Emergency Medicine | Admitting: Emergency Medicine

## 2015-11-01 DIAGNOSIS — R05 Cough: Secondary | ICD-10-CM | POA: Diagnosis not present

## 2015-11-01 DIAGNOSIS — E7800 Pure hypercholesterolemia, unspecified: Secondary | ICD-10-CM | POA: Insufficient documentation

## 2015-11-01 DIAGNOSIS — F17210 Nicotine dependence, cigarettes, uncomplicated: Secondary | ICD-10-CM | POA: Insufficient documentation

## 2015-11-01 DIAGNOSIS — R42 Dizziness and giddiness: Secondary | ICD-10-CM | POA: Diagnosis present

## 2015-11-01 DIAGNOSIS — Z955 Presence of coronary angioplasty implant and graft: Secondary | ICD-10-CM | POA: Insufficient documentation

## 2015-11-01 DIAGNOSIS — Z79899 Other long term (current) drug therapy: Secondary | ICD-10-CM | POA: Diagnosis not present

## 2015-11-01 DIAGNOSIS — I2510 Atherosclerotic heart disease of native coronary artery without angina pectoris: Secondary | ICD-10-CM | POA: Diagnosis not present

## 2015-11-01 DIAGNOSIS — R740 Nonspecific elevation of levels of transaminase and lactic acid dehydrogenase [LDH]: Secondary | ICD-10-CM | POA: Insufficient documentation

## 2015-11-01 DIAGNOSIS — K835 Biliary cyst: Secondary | ICD-10-CM | POA: Diagnosis not present

## 2015-11-01 DIAGNOSIS — Z7982 Long term (current) use of aspirin: Secondary | ICD-10-CM | POA: Diagnosis not present

## 2015-11-01 DIAGNOSIS — R011 Cardiac murmur, unspecified: Secondary | ICD-10-CM | POA: Insufficient documentation

## 2015-11-01 DIAGNOSIS — Z8739 Personal history of other diseases of the musculoskeletal system and connective tissue: Secondary | ICD-10-CM | POA: Diagnosis not present

## 2015-11-01 DIAGNOSIS — K7689 Other specified diseases of liver: Secondary | ICD-10-CM | POA: Insufficient documentation

## 2015-11-01 DIAGNOSIS — Z9889 Other specified postprocedural states: Secondary | ICD-10-CM | POA: Insufficient documentation

## 2015-11-01 DIAGNOSIS — I10 Essential (primary) hypertension: Secondary | ICD-10-CM | POA: Insufficient documentation

## 2015-11-01 DIAGNOSIS — R7401 Elevation of levels of liver transaminase levels: Secondary | ICD-10-CM

## 2015-11-01 LAB — CBC WITH DIFFERENTIAL/PLATELET
BASOS ABS: 0 10*3/uL (ref 0.0–0.1)
BASOS PCT: 0 %
EOS ABS: 0.4 10*3/uL (ref 0.0–0.7)
Eosinophils Relative: 4 %
HEMATOCRIT: 36.5 % — AB (ref 39.0–52.0)
Hemoglobin: 12.3 g/dL — ABNORMAL LOW (ref 13.0–17.0)
Lymphocytes Relative: 41 %
Lymphs Abs: 3.4 10*3/uL (ref 0.7–4.0)
MCH: 28.7 pg (ref 26.0–34.0)
MCHC: 33.7 g/dL (ref 30.0–36.0)
MCV: 85.3 fL (ref 78.0–100.0)
MONO ABS: 1 10*3/uL (ref 0.1–1.0)
Monocytes Relative: 12 %
Neutro Abs: 3.6 10*3/uL (ref 1.7–7.7)
Neutrophils Relative %: 43 %
PLATELETS: 176 10*3/uL (ref 150–400)
RBC: 4.28 MIL/uL (ref 4.22–5.81)
RDW: 13.6 % (ref 11.5–15.5)
WBC: 8.3 10*3/uL (ref 4.0–10.5)

## 2015-11-01 LAB — URINALYSIS, ROUTINE W REFLEX MICROSCOPIC
BILIRUBIN URINE: NEGATIVE
Glucose, UA: NEGATIVE mg/dL
Hgb urine dipstick: NEGATIVE
Ketones, ur: NEGATIVE mg/dL
Leukocytes, UA: NEGATIVE
NITRITE: NEGATIVE
PH: 5.5 (ref 5.0–8.0)
PROTEIN: NEGATIVE mg/dL
Specific Gravity, Urine: 1.025 (ref 1.005–1.030)

## 2015-11-01 LAB — COMPREHENSIVE METABOLIC PANEL
ALBUMIN: 3.3 g/dL — AB (ref 3.5–5.0)
ALK PHOS: 80 U/L (ref 38–126)
ALT: 85 U/L — ABNORMAL HIGH (ref 17–63)
AST: 50 U/L — ABNORMAL HIGH (ref 15–41)
Anion gap: 6 (ref 5–15)
BUN: 17 mg/dL (ref 6–20)
CALCIUM: 9.4 mg/dL (ref 8.9–10.3)
CO2: 23 mmol/L (ref 22–32)
Chloride: 110 mmol/L (ref 101–111)
Creatinine, Ser: 0.68 mg/dL (ref 0.61–1.24)
GFR calc Af Amer: >60 mL/min (ref >60)
GFR calc non Af Amer: >60 mL/min (ref >60)
Glucose, Bld: 119 mg/dL — ABNORMAL HIGH (ref 65–99)
Potassium: 3.1 mmol/L — ABNORMAL LOW (ref 3.5–5.1)
SODIUM: 139 mmol/L (ref 135–145)
TOTAL PROTEIN: 6.9 g/dL (ref 6.5–8.1)
Total Bilirubin: 0.8 mg/dL (ref 0.3–1.2)

## 2015-11-01 NOTE — Discharge Instructions (Signed)
Your dizziness appears to be with standing or walking.  Please have your primary care doctor look at your medications - as the side effects of meds can cause the symptoms you are having. If you dont have a pcp - consider seeing the Neurologist.  As discussed, the liver cyst dont appear cancerous. Ensure that your primary doctor can monitor them.   Dizziness Dizziness is a common problem. It is a feeling of unsteadiness or light-headedness. You may feel like you are about to faint. Dizziness can lead to injury if you stumble or fall. Anyone can become dizzy, but dizziness is more common in older adults. This condition can be caused by a number of things, including medicines, dehydration, or illness. HOME CARE INSTRUCTIONS Taking these steps may help with your condition: Eating and Drinking  Drink enough fluid to keep your urine clear or pale yellow. This helps to keep you from becoming dehydrated. Try to drink more clear fluids, such as water.  Do not drink alcohol.  Limit your caffeine intake if directed by your health care provider.  Limit your salt intake if directed by your health care provider. Activity  Avoid making quick movements.  Rise slowly from chairs and steady yourself until you feel okay.  In the morning, first sit up on the side of the bed. When you feel okay, stand slowly while you hold onto something until you know that your balance is fine.  Move your legs often if you need to stand in one place for a long time. Tighten and relax your muscles in your legs while you are standing.  Do not drive or operate heavy machinery if you feel dizzy.  Avoid bending down if you feel dizzy. Place items in your home so that they are easy for you to reach without leaning over. Lifestyle  Do not use any tobacco products, including cigarettes, chewing tobacco, or electronic cigarettes. If you need help quitting, ask your health care provider.  Try to reduce your stress level, such  as with yoga or meditation. Talk with your health care provider if you need help. General Instructions  Watch your dizziness for any changes.  Take medicines only as directed by your health care provider. Talk with your health care provider if you think that your dizziness is caused by a medicine that you are taking.  Tell a friend or a family member that you are feeling dizzy. If he or she notices any changes in your behavior, have this person call your health care provider.  Keep all follow-up visits as directed by your health care provider. This is important. SEEK MEDICAL CARE IF:  Your dizziness does not go away.  Your dizziness or light-headedness gets worse.  You feel nauseous.  You have reduced hearing.  You have new symptoms.  You are unsteady on your feet or you feel like the room is spinning. SEEK IMMEDIATE MEDICAL CARE IF:  You vomit or have diarrhea and are unable to eat or drink anything.  You have problems talking, walking, swallowing, or using your arms, hands, or legs.  You feel generally weak.  You are not thinking clearly or you have trouble forming sentences. It may take a friend or family member to notice this.  You have chest pain, abdominal pain, shortness of breath, or sweating.  Your vision changes.  You notice any bleeding.  You have a headache.  You have neck pain or a stiff neck.  You have a fever.   This information  is not intended to replace advice given to you by your health care provider. Make sure you discuss any questions you have with your health care provider.   Document Released: 05/07/2001 Document Revised: 03/28/2015 Document Reviewed: 11/07/2014 Elsevier Interactive Patient Education [employer].

## 2015-11-01 NOTE — ED Provider Notes (Signed)
CSN: [id number]     Arrival date & time 11/01/15  1844 History  By signing my name below, I, [name], attest that this documentation has been prepared under the direction and in the presence of Varney Biles, MD. Electronically Signed: [name], ED Scribe. 11/01/2015. 9:56 PM.  Chief Complaint  Patient presents with  . Dizziness      The history is provided by the patient. No language interpreter was used.    HPI Comments: Ronald Mcdowell is a 69 y.o. male with a medical hx of HTN, elevated cholesterol, CAD, who presents to the Emergency Department complaining of progressively worsening, intermittent, dizziness x 5-6 months. He notes that for the past 2 months he has began to have dizzy spells daily. He reports that his dizziness is worsened with ambulation but with laying flat there is no dizziness. He states that at work today he lost his balance and he felt like he was about to fall but he didn't and he sat down for 30 minutes. He states that he is having associated symptoms of nausea, lightheaded, and mild cough. He states that he has not tried any medications for the relief for his symptoms. He denies SOB, and any other symptoms. No near syncope, or syncope.  He states that he had a heart cath placed in 2014 with no blockage found. He states that when he saw his cardiologist on September 29, his symptoms weren't bad and he didn't mention it to his cardiologist. He states that in June he weighed 161 lbs and he voices that he has been losing a lot of weight. He notes that 3 times that he has used the toilet his stool was red with the last episode being 4 days ago. Pt last colonoscopy was last year with no CA found. He states that he smokes 1 PPD.     Past Medical History  Diagnosis Date  . Hypertension   . Elevated cholesterol   . Coronary artery disease     a. h/o MI in 2000 w/ prior LCX stenting;  b. 7.2014 Abnl Cardiolite, EF 41%, mod-large inferolat scar;  c. 07/2013 Cath/PCI:  LM nl, LAD 10-20, D1 40-50p, LCX 95-99 @ distal stent margin (2.5x20 Promus Premier DES), RCA dom, 40p, 22m, 70d, EF 35-40%.  . Tobacco user   . Ischemic cardiomyopathy     a. 07/2013 EF 35-40% by LV gram.  . Spondylosis of cervical joint     C3-4, C6-7   Past Surgical History  Procedure Laterality Date  . Coronary stent placement  2000  . Cardiac catheterization    . Transurethral resection of prostate  01/09/2012    Procedure: TRANSURETHRAL RESECTION OF THE PROSTATE (TURP);  Surgeon: Marissa Nestle, MD;  Location: [facility];  Service: Urology;  Laterality: N/A;  . Coronary angioplasty with stent placement  08/23/2013    mid circumflex  DES    by Dr Martinique  . Left heart catheterization with coronary angiogram N/A 08/23/2013    Procedure: LEFT HEART CATHETERIZATION WITH CORONARY ANGIOGRAM;  Surgeon: Peter M Martinique, MD;  Location: [facility] CATH LAB;  Service: Cardiovascular;  Laterality: N/A;   Family History  Problem Relation Age of Onset  . Anesthesia problems Neg Hx   . Hypotension Neg Hx   . Malignant hyperthermia Neg Hx   . Pseudochol deficiency Neg Hx    Social History  Substance Use Topics  . Smoking status: Current Every Day Smoker -- 1.00 packs/day for 40 years  Types: Cigarettes    Start date: 12/09/1965  . Smokeless tobacco: Never Used  . Alcohol Use: No    Review of Systems  Respiratory: Positive for cough. Negative for shortness of breath.   Gastrointestinal: Positive for nausea.  Neurological: Positive for dizziness and light-headedness. Negative for syncope.    A complete 10 system review of systems was obtained and all systems are negative except as noted in the HPI and PMH.   Allergies  Review of patient's allergies indicates no known allergies.  Home Medications   Prior to Admission medications   Medication Sig Start Date End Date Taking? Authorizing Provider  amLODipine (NORVASC) 10 MG tablet Take 10 mg by mouth daily.     Yes Historical Provider, MD   aspirin 81 MG EC tablet Take 2 tablets (162 mg total) by mouth daily. 04/12/15  Yes Rosita Fire, MD  atorvastatin (LIPITOR) 40 MG tablet Take 40 mg by mouth at bedtime.  08/09/13  Yes Historical Provider, MD  bisoprolol (ZEBETA) 10 MG tablet Take 0.5 tablets (5 mg total) by mouth daily. 06/07/13  Yes Kathie Dike, MD  lisinopril (PRINIVIL,ZESTRIL) 10 MG tablet Take 10 mg by mouth daily.  02/14/14  Yes Historical Provider, MD  nitroGLYCERIN (NITROSTAT) 0.4 MG SL tablet Place 1 tablet (0.4 mg total) under the tongue every 5 (five) minutes as needed for chest pain. 08/24/13  Yes Rogelia Mire, NP  omeprazole (PRILOSEC) 20 MG capsule Take 20 mg by mouth daily.  02/03/15  Yes Historical Provider, MD   BP 156/72 mmHg  Pulse 84  Temp(Src) 97.9 F (36.6 C) (Oral)  Resp 17  Ht 5\' 5"  (1.651 m)  Wt 135 lb 11.2 oz (61.553 kg)  BMI 22.58 kg/m2  SpO2 100% Physical Exam  Constitutional: He is oriented to person, place, and time. He appears well-developed and well-nourished. No distress.  HENT:  Head: Normocephalic and atraumatic.  Eyes: EOM are normal.  Pupils are 5 mm and equal bilaterally   Neck: Neck supple.  Cardiovascular: Normal rate and regular rhythm.  Exam reveals no gallop and no friction rub.   Murmur heard.  Systolic murmur is present  Pulses:      Radial pulses are 2+ on the right side, and 2+ on the left side.  Systolic murmur  Pulmonary/Chest: Effort normal and breath sounds normal. No respiratory distress. He has no wheezes. He has no rales.  Abdominal: Soft. He exhibits no mass. There is no tenderness.  No palpable mass  Musculoskeletal: Normal range of motion.  Neurological: He is alert and oriented to person, place, and time.  Cranial nerves 2-12 intact. cerebellar exam with finger-to-nose and heel-to-shin are nl.   Skin: Skin is warm and dry.  Psychiatric: He has a normal mood and affect. His behavior is normal.  Nursing note and vitals reviewed.   ED Course   Procedures (including critical care time) DIAGNOSTIC STUDIES: Oxygen Saturation is 100% on RA, nl by my interpretation.    COORDINATION OF CARE: 9:55 PM Discussed treatment plan with pt at bedside which includes labs and f/u with PCP if symptoms persist and pt agreed to plan.    Labs Review Labs Reviewed  COMPREHENSIVE METABOLIC PANEL - Abnormal; Notable for the following:    Potassium 3.1 (*)    Glucose, Bld 119 (*)    Albumin 3.3 (*)    AST 50 (*)    ALT 85 (*)    All other components within normal limits  CBC WITH DIFFERENTIAL/PLATELET -  Abnormal; Notable for the following:    Hemoglobin 12.3 (*)    HCT 36.5 (*)    All other components within normal limits  URINALYSIS, ROUTINE W REFLEX MICROSCOPIC (NOT AT [facility])    Imaging Review No results found. I have personally reviewed and evaluated these lab results as part of my medical decision-making.   EKG Interpretation None      MDM   Final diagnoses:  Orthostatic dizziness  Postural dizziness  Hepatic cyst  Elevated transaminase level    I personally performed the services described in this documentation, which was scribed in my presence. The recorded information has been reviewed and is accurate.  Pt comes in with postural dizziness. Pt has hx of CHF. He denies any new meds. UA is neg for dehydration and he denies any significant fluid loss, or increase in heart meds. Pt's labs are reassuring. The orthostatics are neg. Pt has no constant dizziness and his neuro exam is neg, thus unlikely to be a central process either. It is reassuring to see that pt had carotid dopplers and MRA of the brain recently, and there was no significant obstruction noted.  We have advised pcp f/u. Pt however is in the process of switching pcp and has no appointment set. Neuro f/u info given - the symptoms could be related to his meds, or he might have POTS or autonomic orthostasis.    Varney Biles, MD 11/01/15 2302

## 2015-11-01 NOTE — ED Notes (Addendum)
Pt c/o dizziness and weakness since August. Pt states when he walks he feels dizzy. Pt states he has been losing weight since August. Pt states he has seen his PCP and has another appt. Pt had a ct scan here on Monday and they are wanting to know the results.

## 2015-11-05 ENCOUNTER — Emergency Department (HOSPITAL_COMMUNITY)
Admission: EM | Admit: 2015-11-05 | Discharge: 2015-11-05 | Disposition: A | Discharge status: Home / Self Care | Payer: Medicare Other | Attending: Emergency Medicine | Admitting: Emergency Medicine

## 2015-11-05 ENCOUNTER — Encounter (HOSPITAL_COMMUNITY): Payer: Self-pay | Admitting: Emergency Medicine

## 2015-11-05 DIAGNOSIS — Z9861 Coronary angioplasty status: Secondary | ICD-10-CM | POA: Diagnosis not present

## 2015-11-05 DIAGNOSIS — I2510 Atherosclerotic heart disease of native coronary artery without angina pectoris: Secondary | ICD-10-CM | POA: Insufficient documentation

## 2015-11-05 DIAGNOSIS — R109 Unspecified abdominal pain: Secondary | ICD-10-CM | POA: Diagnosis present

## 2015-11-05 DIAGNOSIS — Z79899 Other long term (current) drug therapy: Secondary | ICD-10-CM | POA: Diagnosis not present

## 2015-11-05 DIAGNOSIS — F17210 Nicotine dependence, cigarettes, uncomplicated: Secondary | ICD-10-CM | POA: Diagnosis not present

## 2015-11-05 DIAGNOSIS — E785 Hyperlipidemia, unspecified: Secondary | ICD-10-CM | POA: Insufficient documentation

## 2015-11-05 DIAGNOSIS — I10 Essential (primary) hypertension: Secondary | ICD-10-CM | POA: Insufficient documentation

## 2015-11-05 DIAGNOSIS — Z7982 Long term (current) use of aspirin: Secondary | ICD-10-CM | POA: Insufficient documentation

## 2015-11-05 DIAGNOSIS — I951 Orthostatic hypotension: Secondary | ICD-10-CM | POA: Insufficient documentation

## 2015-11-05 DIAGNOSIS — R1084 Generalized abdominal pain: Secondary | ICD-10-CM | POA: Diagnosis not present

## 2015-11-05 DIAGNOSIS — R634 Abnormal weight loss: Secondary | ICD-10-CM | POA: Insufficient documentation

## 2015-11-05 DIAGNOSIS — I252 Old myocardial infarction: Secondary | ICD-10-CM | POA: Insufficient documentation

## 2015-11-05 DIAGNOSIS — I498 Other specified cardiac arrhythmias: Secondary | ICD-10-CM | POA: Diagnosis not present

## 2015-11-05 LAB — COMPREHENSIVE METABOLIC PANEL
ALT: 85 U/L — ABNORMAL HIGH (ref 17–63)
AST: 52 U/L — ABNORMAL HIGH (ref 15–41)
Albumin: 3.1 g/dL — ABNORMAL LOW (ref 3.5–5.0)
Alkaline Phosphatase: 82 U/L (ref 38–126)
Anion gap: 6 (ref 5–15)
BILIRUBIN TOTAL: 0.7 mg/dL (ref 0.3–1.2)
BUN: 15 mg/dL (ref 6–20)
CHLORIDE: 110 mmol/L (ref 101–111)
CO2: 26 mmol/L (ref 22–32)
Calcium: 9.3 mg/dL (ref 8.9–10.3)
Creatinine, Ser: 0.71 mg/dL (ref 0.61–1.24)
GFR calc Af Amer: >60 mL/min (ref >60)
Glucose, Bld: 116 mg/dL — ABNORMAL HIGH (ref 65–99)
POTASSIUM: 3.3 mmol/L — AB (ref 3.5–5.1)
Sodium: 142 mmol/L (ref 135–145)
Total Protein: 6.7 g/dL (ref 6.5–8.1)

## 2015-11-05 LAB — RAPID HIV SCREEN (HIV 1/2 AB+AG)
HIV 1/2 Antibodies: NONREACTIVE
HIV-1 P24 Antigen - HIV24: NONREACTIVE

## 2015-11-05 LAB — CBC WITH DIFFERENTIAL/PLATELET
BASOS ABS: 0 10*3/uL (ref 0.0–0.1)
Basophils Relative: 0 %
EOS PCT: 4 %
Eosinophils Absolute: 0.3 10*3/uL (ref 0.0–0.7)
HEMATOCRIT: 35.6 % — AB (ref 39.0–52.0)
Hemoglobin: 12.1 g/dL — ABNORMAL LOW (ref 13.0–17.0)
LYMPHS PCT: 31 %
Lymphs Abs: 2.9 10*3/uL (ref 0.7–4.0)
MCH: 28.8 pg (ref 26.0–34.0)
MCHC: 34 g/dL (ref 30.0–36.0)
MCV: 84.8 fL (ref 78.0–100.0)
MONO ABS: 1.2 10*3/uL — AB (ref 0.1–1.0)
MONOS PCT: 13 %
NEUTROS ABS: 4.8 10*3/uL (ref 1.7–7.7)
Neutrophils Relative %: 52 %
PLATELETS: 185 10*3/uL (ref 150–400)
RBC: 4.2 MIL/uL — ABNORMAL LOW (ref 4.22–5.81)
RDW: 13.4 % (ref 11.5–15.5)
WBC: 9.2 10*3/uL (ref 4.0–10.5)

## 2015-11-05 LAB — URINALYSIS, ROUTINE W REFLEX MICROSCOPIC
BILIRUBIN URINE: NEGATIVE
Glucose, UA: NEGATIVE mg/dL
Ketones, ur: NEGATIVE mg/dL
Nitrite: NEGATIVE
PROTEIN: 30 mg/dL — AB
SPECIFIC GRAVITY, URINE: 1.02 (ref 1.005–1.030)
pH: 5.5 (ref 5.0–8.0)

## 2015-11-05 LAB — POC OCCULT BLOOD, ED: Fecal Occult Bld: NEGATIVE

## 2015-11-05 LAB — I-STAT TROPONIN, ED: TROPONIN I, POC: 0.01 ng/mL (ref 0.00–0.08)

## 2015-11-05 LAB — URINE MICROSCOPIC-ADD ON

## 2015-11-05 LAB — LIPASE, BLOOD: Lipase: 64 U/L — ABNORMAL HIGH (ref 11–51)

## 2015-11-05 MED ORDER — SODIUM CHLORIDE 0.9 % IV BOLUS (SEPSIS)
1000.0000 mL | Freq: Once | INTRAVENOUS | Status: AC
  Administered 2015-11-05: 1000 mL via INTRAVENOUS

## 2015-11-05 NOTE — ED Notes (Signed)
Patient c/o dizziness, headache, neck pain, weakness in legs bilaterally, and right flank pain. Per wife patient has been dizzy with weakness in legs in which he was seen here 12/7 which he did not receive a diagnose for. Weakness in legs is progressively getting worse. Per patient states that every time he stands up he feels that he is going to fall. Patient now started c/o headache, neck pain, and right flank pain yesterday. Denies any nausea, vomiting, fevers, or diarrhea. Per patient urinates it takes awhile for stream to start and stools ar bright green.

## 2015-11-05 NOTE — Discharge Instructions (Signed)
Please follow-up on Tuesday with her primary care doctor as scheduled for further workup of your symptoms. We did not find a serious cause today. Please return without fail for worsening symptoms including passing out, chest pain, difficulty breathing, worsening pain, or any other symptoms concerning to you.  Abdominal Pain, Adult Many things can cause abdominal pain. Usually, abdominal pain is not caused by a disease and will improve without treatment. It can often be observed and treated at home. Your health care provider will do a physical exam and possibly order blood tests and X-rays to help determine the seriousness of your pain. However, in many cases, more time must pass before a clear cause of the pain can be found. Before that point, your health care provider may not know if you need more testing or further treatment. HOME CARE INSTRUCTIONS Monitor your abdominal pain for any changes. The following actions may help to alleviate any discomfort you are experiencing:  Only take over-the-counter or prescription medicines as directed by your health care provider.  Do not take laxatives unless directed to do so by your health care provider.  Try a clear liquid diet (broth, tea, or water) as directed by your health care provider. Slowly move to a bland diet as tolerated. SEEK MEDICAL CARE IF:  You have unexplained abdominal pain.  You have abdominal pain associated with nausea or diarrhea.  You have pain when you urinate or have a bowel movement.  You experience abdominal pain that wakes you in the night.  You have abdominal pain that is worsened or improved by eating food.  You have abdominal pain that is worsened with eating fatty foods.  You have a fever. SEEK IMMEDIATE MEDICAL CARE IF:  Your pain does not go away within 2 hours.  You keep throwing up (vomiting).  Your pain is felt only in portions of the abdomen, such as the right side or the left lower portion of the  abdomen.  You pass bloody or black tarry stools. MAKE SURE YOU:  Understand these instructions.  Will watch your condition.  Will get help right away if you are not doing well or get worse.   This information is not intended to replace advice given to you by your health care provider. Make sure you discuss any questions you have with your health care provider.   Document Released: 08/21/2005 Document Revised: 08/02/2015 Document Reviewed: 07/21/2013 Elsevier Interactive Patient Education [employer].

## 2015-11-05 NOTE — ED Provider Notes (Signed)
CSN: [id number]     Arrival date & time 11/05/15  1548 History   First MD Initiated Contact with Patient 11/05/15 1623     Chief Complaint  Patient presents with  . Abdominal Pain     (Consider location/radiation/quality/duration/timing/severity/associated sxs/prior Treatment) HPI 69 year old male with history of hypertension, hyperlipidemia, ischemic cardiomyopathy who presents with weight loss and intermittent lightheadedness for several months. Was recently seen in the emergency department 4 days ago for evaluation of the same symptoms. States that his symptoms have been persistent, and came back to the ED for evaluation. States that for the past several months despite a normal appetite he has had significant weight loss. In June stating that he weighed 161 pounds, more currently is waiting around 127 to 130 pounds. Has had night sweats, but no fevers or chills. Has not had any associating chest pain, difficulty breathing, dyspnea on exertion. Has not had any syncope associated with this.  States that he had colonoscopy 1 year ago that was unremarkable for acute processes. Had a CT abdomen pelvis this week for evaluation of his abdominal pain in the emergency department, only revealing evidence of liver cysts. He also last had cardiac cath in 2014 with no blockages found. He has also recently had MRA/MRI brain and carotid ultrasounds in May 2016 that was unremarkable.  Past Medical History  Diagnosis Date  . Hypertension   . Elevated cholesterol   . Coronary artery disease     a. h/o MI in 2000 w/ prior LCX stenting;  b. 7.2014 Abnl Cardiolite, EF 41%, mod-large inferolat scar;  c. 07/2013 Cath/PCI: LM nl, LAD 10-20, D1 40-50p, LCX 95-99 @ distal stent margin (2.5x20 Promus Premier DES), RCA dom, 40p, 49m, 70d, EF 35-40%.  . Tobacco user   . Ischemic cardiomyopathy     a. 07/2013 EF 35-40% by LV gram.  . Spondylosis of cervical joint     C3-4, C6-7   Past Surgical History  Procedure  Laterality Date  . Coronary stent placement  2000  . Cardiac catheterization    . Transurethral resection of prostate  01/09/2012    Procedure: TRANSURETHRAL RESECTION OF THE PROSTATE (TURP);  Surgeon: Marissa Nestle, MD;  Location: [facility];  Service: Urology;  Laterality: N/A;  . Coronary angioplasty with stent placement  08/23/2013    mid circumflex  DES    by Dr Martinique  . Left heart catheterization with coronary angiogram N/A 08/23/2013    Procedure: LEFT HEART CATHETERIZATION WITH CORONARY ANGIOGRAM;  Surgeon: Peter M Martinique, MD;  Location: [facility] CATH LAB;  Service: Cardiovascular;  Laterality: N/A;   Family History  Problem Relation Age of Onset  . Anesthesia problems Neg Hx   . Hypotension Neg Hx   . Malignant hyperthermia Neg Hx   . Pseudochol deficiency Neg Hx    Social History  Substance Use Topics  . Smoking status: Current Every Day Smoker -- 1.00 packs/day for 40 years    Types: Cigarettes    Start date: 12/09/1965  . Smokeless tobacco: Never Used  . Alcohol Use: No    Review of Systems 10/14 systems reviewed and are negative other than those stated in the HPI    Allergies  Review of patient's allergies indicates no known allergies.  Home Medications   Prior to Admission medications   Medication Sig Start Date End Date Taking? Authorizing Provider  amLODipine (NORVASC) 10 MG tablet Take 10 mg by mouth daily.     Yes Historical Provider, MD  aspirin 81 MG EC tablet Take 2 tablets (162 mg total) by mouth daily. 04/12/15  Yes Rosita Fire, MD  atorvastatin (LIPITOR) 40 MG tablet Take 40 mg by mouth at bedtime.  08/09/13  Yes Historical Provider, MD  bisoprolol (ZEBETA) 10 MG tablet Take 0.5 tablets (5 mg total) by mouth daily. 06/07/13  Yes Kathie Dike, MD  lisinopril (PRINIVIL,ZESTRIL) 10 MG tablet Take 10 mg by mouth daily.  02/14/14  Yes Historical Provider, MD  nitroGLYCERIN (NITROSTAT) 0.4 MG SL tablet Place 1 tablet (0.4 mg total) under the tongue every 5 (five)  minutes as needed for chest pain. 08/24/13  Yes Rogelia Mire, NP  omeprazole (PRILOSEC) 20 MG capsule Take 20 mg by mouth daily.  02/03/15  Yes Historical Provider, MD   BP 179/69 mmHg  Pulse 81  Temp(Src) 98.1 F (36.7 C) (Oral)  Resp 20  Ht 5\' 5"  (1.651 m)  Wt 127 lb (57.607 kg)  BMI 21.13 kg/m2  SpO2 97% Physical Exam Physical Exam  Nursing note and vitals reviewed. Constitutional: Well developed, well nourished, non-toxic, and in no acute distress Head: Normocephalic and atraumatic.  Mouth/Throat: Oropharynx is clear and moist.  Neck: Normal range of motion. Neck supple.  Cardiovascular: Normal rate and regular rhythm.   Pulmonary/Chest: Effort normal and breath sounds normal.  Abdominal: Soft. There is no tenderness. There is no rebound and no guarding.  Musculoskeletal: Normal range of motion.  Neurological: Alert, no facial droop, fluent speech, moves all extremities symmetrically Skin: Skin is warm and dry.  Psychiatric: Cooperative  ED Course  Procedures (including critical care time) Labs Review Labs Reviewed  CBC WITH DIFFERENTIAL/PLATELET - Abnormal; Notable for the following:    RBC 4.20 (*)    Hemoglobin 12.1 (*)    HCT 35.6 (*)    Monocytes Absolute 1.2 (*)    All other components within normal limits  COMPREHENSIVE METABOLIC PANEL - Abnormal; Notable for the following:    Potassium 3.3 (*)    Glucose, Bld 116 (*)    Albumin 3.1 (*)    AST 52 (*)    ALT 85 (*)    All other components within normal limits  LIPASE, BLOOD - Abnormal; Notable for the following:    Lipase 64 (*)    All other components within normal limits  URINALYSIS, ROUTINE W REFLEX MICROSCOPIC (NOT AT [facility]) - Abnormal; Notable for the following:    Hgb urine dipstick SMALL (*)    Protein, ur 30 (*)    Leukocytes, UA SMALL (*)    All other components within normal limits  URINE MICROSCOPIC-ADD ON - Abnormal; Notable for the following:    Squamous Epithelial / LPF 0-5 (*)     Bacteria, UA FEW (*)    All other components within normal limits  RAPID HIV SCREEN (HIV 1/2 AB+AG)  I-STAT TROPOININ, ED  POC OCCULT BLOOD, ED    Imaging Review No results found. I have personally reviewed and evaluated these images and lab results as part of my medical decision-making.   EKG Interpretation   Date/Time:  Sunday November 05 2015 16:28:46 EST Ventricular Rate:  73 PR Interval:  178 QRS Duration: 100 QT Interval:  391 QTC Calculation: 431 R Axis:   110 Text Interpretation:  Right and left arm electrode reversal,  interpretation assumes no reversal Sinus rhythm Consider left atrial  enlargement Right axis deviation Anteroseptal infarct, old Abnrm T,  probable ischemia, anterolateral lds - Seen on prior EKGs in 2013  Confirmed  by Juliane Guest MD, Hinton Dyer [phone]) on 11/05/2015 9:06:47 PM      MDM   Final diagnoses:  Generalized abdominal pain  Weight loss  Orthostasis    In short this is a 69 year old male with history of ischemic cardiomyopathy, hypertension, and hyperlipidemia who returns the ED for lightheadedness and weight loss. Symptoms seem very orthostatic in nature, as he states he only feels this when he gets up suddenly from a seated position or if he is on his feet for too long. Symptoms goes away when he is sitting down or lying down, and he has not had any syncopal or near syncopal episodes. His EKG is unchanged from prior without any acute stigmata for arrhythmia. Basic blood work including CBC, comprehensive metabolic panel, and lipase are unchanged from recent blood work this week. He has mild transaminitis, but CT showing no major liver disease. He is mildly elevated lipase, unchanged and not significantly elevated to suggest pancreatitis. I he has stable anemia that is normocytic, no evidence of GI bleed. Rectal exam reveals brown stool that is guaiac negative. Discussed these findings with the patient and at this time there does not seem to be an acute  etiology of his symptoms. He has a follow-up appointment with his primary care doctor in 2 days to continue workup of his weight loss. He states that he is also going to follow up with his cardiologist regarding his lightheadedness.    Forde Dandy, MD 11/05/15 2224

## 2015-11-06 DIAGNOSIS — R634 Abnormal weight loss: Secondary | ICD-10-CM | POA: Diagnosis not present

## 2015-11-06 DIAGNOSIS — F17290 Nicotine dependence, other tobacco product, uncomplicated: Secondary | ICD-10-CM | POA: Diagnosis not present

## 2015-11-06 DIAGNOSIS — I10 Essential (primary) hypertension: Secondary | ICD-10-CM | POA: Diagnosis not present

## 2015-11-06 DIAGNOSIS — F17200 Nicotine dependence, unspecified, uncomplicated: Secondary | ICD-10-CM | POA: Diagnosis not present

## 2015-11-06 DIAGNOSIS — R945 Abnormal results of liver function studies: Secondary | ICD-10-CM | POA: Diagnosis not present

## 2015-11-07 DIAGNOSIS — Z79899 Other long term (current) drug therapy: Secondary | ICD-10-CM | POA: Diagnosis not present

## 2015-11-07 DIAGNOSIS — H81399 Other peripheral vertigo, unspecified ear: Secondary | ICD-10-CM | POA: Diagnosis not present

## 2015-11-07 DIAGNOSIS — I10 Essential (primary) hypertension: Secondary | ICD-10-CM | POA: Diagnosis not present

## 2015-11-07 DIAGNOSIS — G4452 New daily persistent headache (NDPH): Secondary | ICD-10-CM | POA: Diagnosis not present

## 2015-11-07 DIAGNOSIS — E538 Deficiency of other specified B group vitamins: Secondary | ICD-10-CM | POA: Diagnosis not present

## 2015-11-07 DIAGNOSIS — E559 Vitamin D deficiency, unspecified: Secondary | ICD-10-CM | POA: Diagnosis not present

## 2015-11-07 DIAGNOSIS — M818 Other osteoporosis without current pathological fracture: Secondary | ICD-10-CM | POA: Diagnosis not present

## 2015-11-07 DIAGNOSIS — R5383 Other fatigue: Secondary | ICD-10-CM | POA: Diagnosis not present

## 2015-11-07 DIAGNOSIS — R634 Abnormal weight loss: Secondary | ICD-10-CM | POA: Diagnosis not present

## 2015-11-08 ENCOUNTER — Encounter: Payer: Self-pay | Admitting: Internal Medicine

## 2015-11-11 ENCOUNTER — Encounter (HOSPITAL_COMMUNITY): Payer: Self-pay | Admitting: Emergency Medicine

## 2015-11-11 ENCOUNTER — Emergency Department (HOSPITAL_COMMUNITY)
Admission: EM | Admit: 2015-11-11 | Discharge: 2015-11-11 | Disposition: A | Discharge status: Home / Self Care | Payer: Medicare Other | Attending: Emergency Medicine | Admitting: Emergency Medicine

## 2015-11-11 DIAGNOSIS — I10 Essential (primary) hypertension: Secondary | ICD-10-CM | POA: Diagnosis not present

## 2015-11-11 DIAGNOSIS — Z9861 Coronary angioplasty status: Secondary | ICD-10-CM | POA: Insufficient documentation

## 2015-11-11 DIAGNOSIS — I2510 Atherosclerotic heart disease of native coronary artery without angina pectoris: Secondary | ICD-10-CM | POA: Diagnosis not present

## 2015-11-11 DIAGNOSIS — Z9889 Other specified postprocedural states: Secondary | ICD-10-CM | POA: Diagnosis not present

## 2015-11-11 DIAGNOSIS — Z79899 Other long term (current) drug therapy: Secondary | ICD-10-CM | POA: Insufficient documentation

## 2015-11-11 DIAGNOSIS — Z8739 Personal history of other diseases of the musculoskeletal system and connective tissue: Secondary | ICD-10-CM | POA: Diagnosis not present

## 2015-11-11 DIAGNOSIS — Z7982 Long term (current) use of aspirin: Secondary | ICD-10-CM | POA: Insufficient documentation

## 2015-11-11 DIAGNOSIS — R42 Dizziness and giddiness: Secondary | ICD-10-CM | POA: Diagnosis not present

## 2015-11-11 DIAGNOSIS — F17210 Nicotine dependence, cigarettes, uncomplicated: Secondary | ICD-10-CM | POA: Diagnosis not present

## 2015-11-11 DIAGNOSIS — E7800 Pure hypercholesterolemia, unspecified: Secondary | ICD-10-CM | POA: Insufficient documentation

## 2015-11-11 LAB — COMPREHENSIVE METABOLIC PANEL
ALBUMIN: 3.3 g/dL — AB (ref 3.5–5.0)
ALK PHOS: 85 U/L (ref 38–126)
ALT: 186 U/L — AB (ref 17–63)
AST: 92 U/L — AB (ref 15–41)
Anion gap: 9 (ref 5–15)
BILIRUBIN TOTAL: 0.9 mg/dL (ref 0.3–1.2)
BUN: 12 mg/dL (ref 6–20)
CO2: 26 mmol/L (ref 22–32)
CREATININE: 0.82 mg/dL (ref 0.61–1.24)
Calcium: 8.6 mg/dL — ABNORMAL LOW (ref 8.9–10.3)
Chloride: 107 mmol/L (ref 101–111)
GFR calc Af Amer: >60 mL/min (ref >60)
GFR calc non Af Amer: >60 mL/min (ref >60)
GLUCOSE: 136 mg/dL — AB (ref 65–99)
POTASSIUM: 3.1 mmol/L — AB (ref 3.5–5.1)
Sodium: 142 mmol/L (ref 135–145)
TOTAL PROTEIN: 7 g/dL (ref 6.5–8.1)

## 2015-11-11 LAB — CBC WITH DIFFERENTIAL/PLATELET
BASOS ABS: 0 10*3/uL (ref 0.0–0.1)
Basophils Relative: 0 %
EOS ABS: 0.2 10*3/uL (ref 0.0–0.7)
Eosinophils Relative: 1 %
HEMATOCRIT: 38.9 % — AB (ref 39.0–52.0)
HEMOGLOBIN: 13.3 g/dL (ref 13.0–17.0)
LYMPHS PCT: 26 %
Lymphs Abs: 4.1 10*3/uL — ABNORMAL HIGH (ref 0.7–4.0)
MCH: 29 pg (ref 26.0–34.0)
MCHC: 34.2 g/dL (ref 30.0–36.0)
MCV: 84.9 fL (ref 78.0–100.0)
MONOS PCT: 7 %
Monocytes Absolute: 1.1 10*3/uL — ABNORMAL HIGH (ref 0.1–1.0)
NEUTROS PCT: 66 %
Neutro Abs: 10.3 10*3/uL — ABNORMAL HIGH (ref 1.7–7.7)
Platelets: 220 10*3/uL (ref 150–400)
RBC: 4.58 MIL/uL (ref 4.22–5.81)
RDW: 13.6 % (ref 11.5–15.5)
WBC: 15.7 10*3/uL — AB (ref 4.0–10.5)

## 2015-11-11 LAB — URINE MICROSCOPIC-ADD ON

## 2015-11-11 LAB — URINALYSIS, ROUTINE W REFLEX MICROSCOPIC
Bilirubin Urine: NEGATIVE
Glucose, UA: NEGATIVE mg/dL
Ketones, ur: 15 mg/dL — AB
Nitrite: NEGATIVE
Protein, ur: 100 mg/dL — AB
SPECIFIC GRAVITY, URINE: 1.018 (ref 1.005–1.030)
pH: 5 (ref 5.0–8.0)

## 2015-11-11 MED ORDER — POTASSIUM CHLORIDE CRYS ER 20 MEQ PO TBCR
40.0000 meq | EXTENDED_RELEASE_TABLET | Freq: Once | ORAL | Status: AC
  Administered 2015-11-11: 40 meq via ORAL
  Filled 2015-11-11: qty 2

## 2015-11-11 MED ORDER — MECLIZINE HCL 25 MG PO TABS
25.0000 mg | ORAL_TABLET | Freq: Once | ORAL | Status: AC
  Administered 2015-11-11: 25 mg via ORAL
  Filled 2015-11-11: qty 1

## 2015-11-11 MED ORDER — MECLIZINE HCL 25 MG PO TABS: 25.0000 mg | ORAL_TABLET | Freq: Three times a day (TID) | ORAL | Status: DC | PRN

## 2015-11-11 NOTE — ED Notes (Signed)
Pt. reports chronic dizziness , lightheadedness with unsteady gait for 5 months worse these past several days seen at [place] last week for the same complaint, denies fever or chills , no nausea or vomitting . Alert and oriented , speech clear with no facial asymmetry /equal grips and no arm drift.

## 2015-11-11 NOTE — Discharge Instructions (Signed)
Follow-up closely with neurology. If your symptoms worsen or don't improve discussed possible MRI. Stay well-hydrated go to stand up do it slowly carefully. Purchase a cane to help with walking.  If you were given medicines take as directed.  If you are on coumadin or contraceptives realize their levels and effectiveness is altered by many different medicines.  If you have any reaction (rash, tongues swelling, other) to the medicines stop taking and see a physician.    If your blood pressure was elevated in the ER make sure you follow up for management with a primary doctor or return for chest pain, shortness of breath or stroke symptoms.  Please follow up as directed and return to the ER or see a physician for new or worsening symptoms.  Thank you. Filed Vitals:   11/11/15 1953  BP: 151/73  Pulse: 77  Temp: 98.9 F (37.2 C)  Resp: 20  Height: 5\' 5"  (1.651 m)  Weight: 131 lb (59.421 kg)  SpO2: 97%    Dizziness Dizziness is a common problem. It makes you feel unsteady or lightheaded. You may feel like you are about to pass out (faint). Dizziness can lead to injury if you stumble or fall. Anyone can get dizzy, but dizziness is more common in older adults. This condition can be caused by a number of things, including:  Medicines.  Dehydration.  Illness. HOME CARE Following these instructions may help with your condition: Eating and Drinking  Drink enough fluid to keep your pee (urine) clear or pale yellow. This helps to keep you from getting dehydrated. Try to drink more clear fluids, such as water.  Do not drink alcohol.  Limit how much caffeine you drink or eat if told by your doctor.  Limit how much salt you drink or eat if told by your doctor. Activity  Avoid making quick movements.  When you stand up from sitting in a chair, steady yourself until you feel okay.  In the morning, first sit up on the side of the bed. When you feel okay, stand slowly while you hold onto  something. Do this until you know that your balance is fine.  Move your legs often if you need to stand in one place for a long time. Tighten and relax your muscles in your legs while you are standing.  Do not drive or use heavy machinery if you feel dizzy.  Avoid bending down if you feel dizzy. Place items in your home so that they are easy for you to reach without leaning over. Lifestyle  Do not use any tobacco products, including cigarettes, chewing tobacco, or electronic cigarettes. If you need help quitting, ask your doctor.  Try to lower your stress level, such as with yoga or meditation. Talk with your doctor if you need help. General Instructions  Watch your dizziness for any changes.  Take medicines only as told by your doctor. Talk with your doctor if you think that your dizziness is caused by a medicine that you are taking.  Tell a friend or a family member that you are feeling dizzy. If he or she notices any changes in your behavior, have this person call your doctor.  Keep all follow-up visits as told by your doctor. This is important. GET HELP IF:  Your dizziness does not go away.  Your dizziness or light-headedness gets worse.  You feel sick to your stomach (nauseous).  You have trouble hearing.  You have new symptoms.  You are unsteady on your  feet or you feel like the room is spinning. GET HELP RIGHT AWAY IF:  You throw up (vomit) or have diarrhea and are unable to eat or drink anything.  You have trouble:  Talking.  Walking.  Swallowing.  Using your arms, hands, or legs.  You feel generally weak.  You are not thinking clearly or you have trouble forming sentences. It may take a friend or family member to notice this.  You have:  Chest pain.  Pain in your belly (abdomen).  Shortness of breath.  Sweating.  Your vision changes.  You are bleeding.  You have a headache.  You have neck pain or a stiff neck.  You have a fever.   This  information is not intended to replace advice given to you by your health care provider. Make sure you discuss any questions you have with your health care provider.   Document Released: 10/31/2011 Document Revised: 03/28/2015 Document Reviewed: 11/07/2014 Elsevier Interactive Patient Education [employer].

## 2015-11-11 NOTE — ED Provider Notes (Signed)
CSN: [id number]     Arrival date & time 11/11/15  1943 History   First MD Initiated Contact with Patient 11/11/15 2053     Chief Complaint  Patient presents with  . Dizziness     (Consider location/radiation/quality/duration/timing/severity/associated sxs/prior Treatment) HPI Comments: 69 year old male with history of cholesterol, tobacco use, coronary artery disease, bradycardia, TIA, cardiopathy presents with intermittent dizziness for the past 4 months. Patient has been following with neurology and primary doctor. Patient's currently finishing prednisone taper however still has intermittent dizziness worse with standing and movement. Patient has mild ear symptoms. No fevers or chills. No headache. No chest pain or shortness of breath. No bleeding. Mild balance issues. Symptoms are intermittent.  Patient is a 69 y.o. male presenting with dizziness. The history is provided by the patient.  Dizziness Associated symptoms: no chest pain, no headaches, no shortness of breath and no vomiting     Past Medical History  Diagnosis Date  . Hypertension   . Elevated cholesterol   . Coronary artery disease     a. h/o MI in 2000 w/ prior LCX stenting;  b. 7.2014 Abnl Cardiolite, EF 41%, mod-large inferolat scar;  c. 07/2013 Cath/PCI: LM nl, LAD 10-20, D1 40-50p, LCX 95-99 @ distal stent margin (2.5x20 Promus Premier DES), RCA dom, 40p, 60m, 70d, EF 35-40%.  . Tobacco user   . Ischemic cardiomyopathy     a. 07/2013 EF 35-40% by LV gram.  . Spondylosis of cervical joint     C3-4, C6-7   Past Surgical History  Procedure Laterality Date  . Coronary stent placement  2000  . Cardiac catheterization    . Transurethral resection of prostate  01/09/2012    Procedure: TRANSURETHRAL RESECTION OF THE PROSTATE (TURP);  Surgeon: Marissa Nestle, MD;  Location: [facility];  Service: Urology;  Laterality: N/A;  . Coronary angioplasty with stent placement  08/23/2013    mid circumflex  DES    by Dr Martinique  .  Left heart catheterization with coronary angiogram N/A 08/23/2013    Procedure: LEFT HEART CATHETERIZATION WITH CORONARY ANGIOGRAM;  Surgeon: Peter M Martinique, MD;  Location: [facility] CATH LAB;  Service: Cardiovascular;  Laterality: N/A;   Family History  Problem Relation Age of Onset  . Anesthesia problems Neg Hx   . Hypotension Neg Hx   . Malignant hyperthermia Neg Hx   . Pseudochol deficiency Neg Hx    Social History  Substance Use Topics  . Smoking status: Current Every Day Smoker -- 0.00 packs/day for 40 years    Types: Cigarettes    Start date: 12/09/1965  . Smokeless tobacco: Never Used  . Alcohol Use: No    Review of Systems  Constitutional: Negative for fever and chills.  HENT: Negative for congestion.   Eyes: Negative for visual disturbance.  Respiratory: Negative for shortness of breath.   Cardiovascular: Negative for chest pain.  Gastrointestinal: Negative for vomiting and abdominal pain.  Genitourinary: Negative for dysuria and flank pain.  Musculoskeletal: Negative for back pain, neck pain and neck stiffness.  Skin: Negative for rash.  Neurological: Positive for dizziness and light-headedness. Negative for headaches.      Allergies  Review of patient's allergies indicates no known allergies.  Home Medications   Prior to Admission medications   Medication Sig Start Date End Date Taking? Authorizing Provider  amLODipine (NORVASC) 10 MG tablet Take 10 mg by mouth daily.     Yes Historical Provider, MD  aspirin 81 MG EC tablet Take 2  tablets (162 mg total) by mouth daily. Patient taking differently: Take 81 mg by mouth daily.  04/12/15  Yes Rosita Fire, MD  atorvastatin (LIPITOR) 40 MG tablet Take 40 mg by mouth at bedtime.  08/09/13  Yes Historical Provider, MD  bisoprolol (ZEBETA) 10 MG tablet Take 0.5 tablets (5 mg total) by mouth daily. 06/07/13  Yes Kathie Dike, MD  lisinopril (PRINIVIL,ZESTRIL) 10 MG tablet Take 10 mg by mouth daily.  02/14/14  Yes Historical  Provider, MD  methylPREDNISolone (MEDROL DOSEPAK) 4 MG TBPK tablet Take 4-8 mg by mouth See admin instructions. Tapered course:  1st day: take 2 tablets (8 mg) before breakfast, 1 tablet (4 mg) after lunch and supper and 2 tablets at bedtime, 2nd day, take 1 tablet before breakfast, after lunch & supper and 2 tablets at bedtime, day 3: take 1 tablet before breakfast, after lunch & supper and at bedtime, day 4: take 1 tablet before breakfast, after lunch & at bedtime, day 5: take 1 tablet before breakfast and at bedtime, day 6, take 1 tablet before breakfast 11/07/15  Yes Historical Provider, MD  omeprazole (PRILOSEC) 20 MG capsule Take 20 mg by mouth daily.  02/03/15  Yes Historical Provider, MD  meclizine (ANTIVERT) 25 MG tablet Take 1 tablet (25 mg total) by mouth 3 (three) times daily as needed for dizziness. 11/11/15   Elnora Morrison, MD  nitroGLYCERIN (NITROSTAT) 0.4 MG SL tablet Place 1 tablet (0.4 mg total) under the tongue every 5 (five) minutes as needed for chest pain. Patient not taking: Reported on 11/11/2015 08/24/13   Rogelia Mire, NP   BP 151/73 mmHg  Pulse 77  Temp(Src) 98.9 F (37.2 C)  Resp 20  Ht 5\' 5"  (1.651 m)  Wt 131 lb (59.421 kg)  BMI 21.80 kg/m2  SpO2 97% Physical Exam  Constitutional: He is oriented to person, place, and time. He appears well-developed and well-nourished.  HENT:  Head: Normocephalic and atraumatic.  Eyes: Conjunctivae are normal. Right eye exhibits no discharge. Left eye exhibits no discharge.  Neck: Normal range of motion. Neck supple. No tracheal deviation present.  Cardiovascular: Normal rate and regular rhythm.   Pulmonary/Chest: Effort normal and breath sounds normal.  Abdominal: Soft. He exhibits no distension. There is no tenderness. There is no guarding.  Musculoskeletal: He exhibits no edema.  Neurological: He is alert and oriented to person, place, and time. Coordination and gait normal.  5+ strength in UE and LE with f/e at major  joints. Sensation to palpation intact in UE and LE. CNs 2-12 grossly intact.  EOMFI.  PERRL.   Finger nose and coordination intact bilateral.   Visual fields intact to finger testing. No nystagmus   Skin: Skin is warm. No rash noted.  Psychiatric: He has a normal mood and affect.  Nursing note and vitals reviewed.   ED Course  Procedures (including critical care time) Labs Review Labs Reviewed  CBC WITH DIFFERENTIAL/PLATELET - Abnormal; Notable for the following:    WBC 15.7 (*)    HCT 38.9 (*)    Neutro Abs 10.3 (*)    Lymphs Abs 4.1 (*)    Monocytes Absolute 1.1 (*)    All other components within normal limits  COMPREHENSIVE METABOLIC PANEL - Abnormal; Notable for the following:    Potassium 3.1 (*)    Glucose, Bld 136 (*)    Calcium 8.6 (*)    Albumin 3.3 (*)    AST 92 (*)    ALT 186 (*)  All other components within normal limits  URINALYSIS, ROUTINE W REFLEX MICROSCOPIC (NOT AT [facility]) - Abnormal; Notable for the following:    APPearance CLOUDY (*)    Hgb urine dipstick TRACE (*)    Ketones, ur 15 (*)    Protein, ur 100 (*)    Leukocytes, UA SMALL (*)    All other components within normal limits  URINE MICROSCOPIC-ADD ON - Abnormal; Notable for the following:    Squamous Epithelial / LPF 0-5 (*)    Bacteria, UA RARE (*)    Casts HYALINE CASTS (*)    All other components within normal limits    Imaging Review No results found. I have personally reviewed and evaluated these images and lab results as part of my medical decision-making.   EKG Interpretation   Date/Time:  Saturday November 11 2015 22:26:41 EST Ventricular Rate:  69 PR Interval:  170 QRS Duration: 111 QT Interval:  404 QTC Calculation: 433 R Axis:   57 Text Interpretation:  Sinus rhythm Consider right atrial enlargement LVH  with IVCD and secondary repol abnrm Anterior ST elevation, probably due to  LVH overall similar Confirmed by Cylee Dattilo  MD, Avanti Jetter ([id number]) on 11/11/2015  10:35:45 PM       MDM   Final diagnoses:  Vertigo   Patient presents with intermittent vertigo for 4 months. Patient has seen neurology for this. Patient has no focal neuro deficits on exam. No vision loss, no difficulties coronation, normal gait during my assessment. Discussed continued outpatient follow-up and since it is been 4 months do not feel patient needs emergent MRI of the brain. Meclizine and reasons to return discussed.  Results and differential diagnosis were discussed with the patient/parent/guardian. Xrays were independently reviewed by myself.  Close follow up outpatient was discussed, comfortable with the plan.   Medications  meclizine (ANTIVERT) tablet 25 mg (not administered)    Filed Vitals:   11/11/15 1953  BP: 151/73  Pulse: 77  Temp: 98.9 F (37.2 C)  Resp: 20  Height: 5\' 5"  (1.651 m)  Weight: 131 lb (59.421 kg)  SpO2: 97%    Final diagnoses:  Vertigo      Elnora Morrison, MD 11/12/15 [phone]

## 2015-11-11 NOTE — ED Notes (Signed)
Pt. Left with all belongings and refused wheelchair. Discharge instructions were reviewed and all questions were answered.  

## 2015-11-11 NOTE — ED Notes (Signed)
Pt. Given 120 cc of ginger ale

## 2015-11-30 ENCOUNTER — Ambulatory Visit (INDEPENDENT_AMBULATORY_CARE_PROVIDER_SITE_OTHER): Payer: Medicare Other | Admitting: Nurse Practitioner

## 2015-11-30 ENCOUNTER — Encounter: Payer: Self-pay | Admitting: Nurse Practitioner

## 2015-11-30 ENCOUNTER — Other Ambulatory Visit: Payer: Self-pay

## 2015-11-30 VITALS — BP 155/71 | HR 81 | Temp 97.8°F | Ht 65.0 in | Wt 130.6 lb

## 2015-11-30 DIAGNOSIS — R634 Abnormal weight loss: Secondary | ICD-10-CM | POA: Insufficient documentation

## 2015-11-30 DIAGNOSIS — K7689 Other specified diseases of liver: Secondary | ICD-10-CM

## 2015-11-30 DIAGNOSIS — R945 Abnormal results of liver function studies: Secondary | ICD-10-CM

## 2015-11-30 DIAGNOSIS — R935 Abnormal findings on diagnostic imaging of other abdominal regions, including retroperitoneum: Secondary | ICD-10-CM | POA: Diagnosis not present

## 2015-11-30 DIAGNOSIS — R748 Abnormal levels of other serum enzymes: Secondary | ICD-10-CM

## 2015-11-30 DIAGNOSIS — D649 Anemia, unspecified: Secondary | ICD-10-CM

## 2015-11-30 DIAGNOSIS — R799 Abnormal finding of blood chemistry, unspecified: Secondary | ICD-10-CM | POA: Diagnosis not present

## 2015-11-30 DIAGNOSIS — R7989 Other specified abnormal findings of blood chemistry: Secondary | ICD-10-CM | POA: Diagnosis not present

## 2015-11-30 DIAGNOSIS — R109 Unspecified abdominal pain: Secondary | ICD-10-CM

## 2015-11-30 LAB — CBC WITH DIFFERENTIAL/PLATELET
BASOS ABS: 0 10*3/uL (ref 0.0–0.1)
Basophils Relative: 0 % (ref 0–1)
EOS ABS: 0.3 10*3/uL (ref 0.0–0.7)
EOS PCT: 3 % (ref 0–5)
HCT: 37.9 % — ABNORMAL LOW (ref 39.0–52.0)
Hemoglobin: 12.8 g/dL — ABNORMAL LOW (ref 13.0–17.0)
Lymphocytes Relative: 27 % (ref 12–46)
Lymphs Abs: 2.3 10*3/uL (ref 0.7–4.0)
MCH: 28 pg (ref 26.0–34.0)
MCHC: 33.8 g/dL (ref 30.0–36.0)
MCV: 82.9 fL (ref 78.0–100.0)
MONO ABS: 0.9 10*3/uL (ref 0.1–1.0)
MPV: 10.5 fL (ref 8.6–12.4)
Monocytes Relative: 10 % (ref 3–12)
Neutro Abs: 5.2 10*3/uL (ref 1.7–7.7)
Neutrophils Relative %: 60 % (ref 43–77)
PLATELETS: 192 10*3/uL (ref 150–400)
RBC: 4.57 MIL/uL (ref 4.22–5.81)
RDW: 13.7 % (ref 11.5–15.5)
WBC: 8.6 10*3/uL (ref 4.0–10.5)

## 2015-11-30 LAB — COMPREHENSIVE METABOLIC PANEL
ALK PHOS: 73 U/L (ref 40–115)
ALT: 74 U/L — AB (ref 9–46)
AST: 43 U/L — AB (ref 10–35)
Albumin: 3.4 g/dL — ABNORMAL LOW (ref 3.6–5.1)
BUN: 12 mg/dL (ref 7–25)
CO2: 24 mmol/L (ref 20–31)
Calcium: 9.6 mg/dL (ref 8.6–10.3)
Chloride: 108 mmol/L (ref 98–110)
Creat: 0.64 mg/dL — ABNORMAL LOW (ref 0.70–1.25)
GLUCOSE: 80 mg/dL (ref 65–99)
POTASSIUM: 3.8 mmol/L (ref 3.5–5.3)
Sodium: 142 mmol/L (ref 135–146)
Total Bilirubin: 0.8 mg/dL (ref 0.2–1.2)
Total Protein: 6.4 g/dL (ref 6.1–8.1)

## 2015-11-30 LAB — FERRITIN: FERRITIN: 720 ng/mL — AB (ref 22–322)

## 2015-11-30 NOTE — Progress Notes (Signed)
Primary Care Physician:  Rosita Fire, MD Primary Gastroenterologist:  Dr. Gala Romney  Chief Complaint  Patient presents with  . Weight Loss  . abnormal labs    HPI:   70 year old male presents on referral from PCP for elevated liver function tests and weight loss.  PCP notes reviewed, ER notes reviewed, imaging studies reviewed. Patient last saw PCP on 11/06/2015 for follow-up on emergency room visit. Noted abnormal liver function tests in the emergency room and referred to GI. The patient presented on 11/01/2015 for dizziness. It was noted that 3 times he had use the bathroom with red stools and a previous episode 4 days ago. Last colonoscopy was last year with no cancer found, per patient. Abnormal labs on a CMP include AST/ALT mildly elevated at 50/86, normal bilirubin, normal alkaline phosphatase. He was also slightly anemic with an hemoglobin/hematocrit of 12.3/36.5. CT abdomen and pelvis without contrast on 10/30/2015 found no acute abnormality, noted 3 hepatic cysts which appeared to be benign. Also with a renal cyst. A repeat CMP completed 11/11/2015 found persistent elevation of AST/ALT, now at 92/186. Alkaline phosphatase and total bili remained normal. Replete CBC on 11/11/2015 found near resolution of anemia with hemoglobin of 13.3 and hematocrit 38.9.  Today he states he's lost about 40 pounds subjectively in 6 months. Denies abdominal pain, N/V, Did have a couple episodes of rectal bleeding about 3 months ago. Last colonoscopy was at [facility]. Denies yellowing of skin/eyes, darkened urine. Has not been screened for Hepatitis. Saw neurology at [facility] for dizziness and was given medication which alleviated his dizziness.Denies chest pain, dyspnea, dizziness, lightheadedness, syncope, near syncope. Denies any other upper or lower GI symptoms. Denies OTC medications, herbs, supplements.  Past Medical History  Diagnosis Date  . Hypertension   . Elevated cholesterol   .  Coronary artery disease     a. h/o MI in 2000 w/ prior LCX stenting;  b. 7.2014 Abnl Cardiolite, EF 41%, mod-large inferolat scar;  c. 07/2013 Cath/PCI: LM nl, LAD 10-20, D1 40-50p, LCX 95-99 @ distal stent margin (2.5x20 Promus Premier DES), RCA dom, 40p, 31m, 70d, EF 35-40%.  . Tobacco user   . Ischemic cardiomyopathy     a. 07/2013 EF 35-40% by LV gram.  . Spondylosis of cervical joint     C3-4, C6-7    Past Surgical History  Procedure Laterality Date  . Coronary stent placement  2000  . Cardiac catheterization    . Transurethral resection of prostate  01/09/2012    Procedure: TRANSURETHRAL RESECTION OF THE PROSTATE (TURP);  Surgeon: Marissa Nestle, MD;  Location: [facility];  Service: Urology;  Laterality: N/A;  . Coronary angioplasty with stent placement  08/23/2013    mid circumflex  DES    by Dr Martinique  . Left heart catheterization with coronary angiogram N/A 08/23/2013    Procedure: LEFT HEART CATHETERIZATION WITH CORONARY ANGIOGRAM;  Surgeon: Peter M Martinique, MD;  Location[facility] CATH LAB;  Service: Cardiovascular;  Laterality: N/A;    Current Outpatient Prescriptions  Medication Sig Dispense Refill  . amLODipine (NORVASC) 10 MG tablet Take 10 mg by mouth daily.      [name] aspirin 81 MG EC tablet Take 2 tablets (162 mg total) by mouth daily. (Patient taking differently: Take 81 mg by mouth daily. ) 30 tablet 1  . atorvastatin (LIPITOR) 40 MG tablet Take 40 mg by mouth at bedtime. Reported on 11/30/2015    . bisoprolol (ZEBETA) 10 MG tablet Take 0.5 tablets (  5 mg total) by mouth daily.    [name] lisinopril (PRINIVIL,ZESTRIL) 10 MG tablet Take 10 mg by mouth daily.     . methylPREDNISolone (MEDROL DOSEPAK) 4 MG TBPK tablet Take 4-8 mg by mouth See admin instructions. Tapered course:  1st day: take 2 tablets (8 mg) before breakfast, 1 tablet (4 mg) after lunch and supper and 2 tablets at bedtime, 2nd day, take 1 tablet before breakfast, after lunch & supper and 2 tablets at bedtime, day 3: take 1  tablet before breakfast, after lunch & supper and at bedtime, day 4: take 1 tablet before breakfast, after lunch & at bedtime, day 5: take 1 tablet before breakfast and at bedtime, day 6, take 1 tablet before breakfast    . omeprazole (PRILOSEC) 20 MG capsule Take 20 mg by mouth daily.     . meclizine (ANTIVERT) 25 MG tablet Take 1 tablet (25 mg total) by mouth 3 (three) times daily as needed for dizziness. (Patient not taking: Reported on 11/30/2015) 15 tablet 0  . nitroGLYCERIN (NITROSTAT) 0.4 MG SL tablet Place 1 tablet (0.4 mg total) under the tongue every 5 (five) minutes as needed for chest pain. (Patient not taking: Reported on 11/11/2015) 25 tablet 3   No current facility-administered medications for this visit.    Allergies as of 11/30/2015  . (No Known Allergies)    Family History  Problem Relation Age of Onset  . Anesthesia problems Neg Hx   . Hypotension Neg Hx   . Malignant hyperthermia Neg Hx   . Pseudochol deficiency Neg Hx     Social History   Social History  . Marital Status: Married    Spouse Name: N/A  . Number of Children: N/A  . Years of Education: N/A   Occupational History  . Not on file.   Social History Main Topics  . Smoking status: Current Every Day Smoker -- 0.00 packs/day for 40 years    Types: Cigarettes    Start date: 12/09/1965  . Smokeless tobacco: Never Used  . Alcohol Use: No  . Drug Use: No  . Sexual Activity: Yes    Birth Control/ Protection: None   Other Topics Concern  . Not on file   Social History Narrative   Pt gets regular exercise.    Review of Systems: General: Negative for fever, chills, fatigue, weakness. Eyes: Negative for vision changes.  ENT: Negative for hoarseness, difficulty swallowing. CV: Negative for chest pain, angina, palpitations, peripheral edema.  Respiratory: Negative for dyspnea at rest, cough, sputum, wheezing.  GI: See history of present illness. MS: Negative for joint pain, low back pain.  Derm:  Negative for rash or itching.  Endo: Negative for unusual weight change.  Heme: Negative for bruising or bleeding. Allergy: Negative for rash or hives.    Physical Exam: BP 155/71 mmHg  Pulse 81  Temp(Src) 97.8 F (36.6 C)  Ht 5\' 5"  (1.651 m)  Wt 130 lb 9.6 oz (59.24 kg)  BMI 21.73 kg/m2 General:   Alert and oriented. Pleasant and cooperative. Well-nourished and well-developed.  Head:  Normocephalic and atraumatic. Eyes:  Without icterus, sclera clear and conjunctiva pink.  Ears:  Normal auditory acuity. Cardiovascular:  S1, S2 present without murmurs appreciated. Extremities without clubbing or edema. Respiratory:  Clear to auscultation bilaterally. No wheezes, rales, or rhonchi. No distress.  Gastrointestinal:  +BS, soft, and non-distended. No HSM noted. No guarding or rebound. No masses appreciated.  Rectal:  Deferred  Musculoskalatal:  Symmetrical without gross deformities.  Normal posture. Skin:  Intact without significant lesions or rashes. Neurologic:  Alert and oriented x4;  grossly normal neurologically. Psych:  Alert and cooperative. Normal mood and affect..    11/30/2015 9:20 AM

## 2015-11-30 NOTE — Patient Instructions (Signed)
1. Have your labs drawn when you're able to. 2. We will help you schedule your MRI. 3. We will request your previous colonoscopy record. 4. Return for follow-up in 6-8 weeks.

## 2015-12-01 LAB — ANTI-SMOOTH MUSCLE ANTIBODY, IGG: SMOOTH MUSCLE AB: 13 U (ref <20)

## 2015-12-01 LAB — HEPATITIS B SURFACE ANTIGEN: HEP B S AG: NEGATIVE

## 2015-12-01 LAB — ANTI-NUCLEAR AB-TITER (ANA TITER): ANA Titer 1: 1:1280 {titer} — ABNORMAL HIGH

## 2015-12-01 LAB — HEPATITIS C ANTIBODY: HCV AB: NEGATIVE

## 2015-12-01 LAB — ANA: Anti Nuclear Antibody(ANA): POSITIVE — AB

## 2015-12-04 LAB — CERULOPLASMIN: Ceruloplasmin: 31 mg/dL (ref 18–36)

## 2015-12-04 NOTE — Assessment & Plan Note (Signed)
Patient with significant weight loss of approximately 40 pounds over the previous 6 months. Is also had elevated liver function tests which seem to be creeping upward. CT of the abdomen without contrast found 3 hepatic cysts which were felt to be benign. However, this was a CT without contrast. Given his other findings this is a bit concerning. Overall today we will request his previous colonoscopy report from [facility], order CBC, CMP, antimicrosomal antibody of the kidney and liver, ANA, hepatitis C antibody, hepatitis B serum antigen, anti-smooth muscle antibody, ceruloplasmin, and ferritin. We'll also order an MRI of the abdomen with Eovist contrast to further evaluate hepatic cysts to help ensure that they're benign and no further workup needed return for follow-up in 6-8 weeks.

## 2015-12-04 NOTE — Assessment & Plan Note (Signed)
Normal CT of the abdomen as noted in the history of present illness. Hepatic cysts felt to be benign although it was a noncontrast CT. We will further evaluate with an MRI of the abdomen given his loss of weight, anemia, and abnormal LFTs. Her for follow-up in 6-8 weeks.

## 2015-12-04 NOTE — Assessment & Plan Note (Signed)
Patient with elevated liver function tests which seem to be creeping upward. Given this, and his loss of weight, and other findings it is a bit concerning. Workup her liver disease as described above under "loss of weight." Return for follow-up in 6-8 weeks.

## 2015-12-04 NOTE — Assessment & Plan Note (Signed)
Patient with anemia with a hemoglobin of 13.3 on 11/11/2015 in addition to a single day, multi episode occurrence of rectal bleeding. We will request his previous colonoscopy report from [facility]. Based on these records he may need to be scheduled for a colonoscopy to further evaluate. At this time we'll work him up for liver disease as noted above including MRI of the abdomen with Eovist contrast to further evaluate the noted hepatic cysts on CT of the abdomen without contrast. Return for follow-up in 6-8 weeks.

## 2015-12-04 NOTE — Progress Notes (Signed)
Medical records received from [facility]. Per our medical records person [facility] stated there was no record of colonoscopy only endoscopy. The endoscopy with dilation was completed on 10/14/2014 which found stricture in the proximal esophagus status post dilation. Recommend continue PPI, eat slowly and chew food well. Next office visit patient should be scheduled for colonoscopy to further evaluate his bleeding.

## 2015-12-05 LAB — ANTI-MICROSOMAL ANTIBODY LIVER / KIDNEY: LKM1 Ab: <=20 U (ref <=20.0)

## 2015-12-05 NOTE — Progress Notes (Signed)
cc'ed to pcp °

## 2015-12-07 DIAGNOSIS — H81399 Other peripheral vertigo, unspecified ear: Secondary | ICD-10-CM | POA: Diagnosis not present

## 2015-12-07 DIAGNOSIS — R634 Abnormal weight loss: Secondary | ICD-10-CM | POA: Diagnosis not present

## 2015-12-07 DIAGNOSIS — G4452 New daily persistent headache (NDPH): Secondary | ICD-10-CM | POA: Diagnosis not present

## 2015-12-07 DIAGNOSIS — I10 Essential (primary) hypertension: Secondary | ICD-10-CM | POA: Diagnosis not present

## 2015-12-13 ENCOUNTER — Ambulatory Visit (HOSPITAL_COMMUNITY)
Admission: RE | Admit: 2015-12-13 | Discharge: 2015-12-13 | Disposition: A | Discharge status: Home / Self Care | Payer: Medicare Other | Source: Ambulatory Visit | Attending: Nurse Practitioner | Admitting: Nurse Practitioner

## 2015-12-13 DIAGNOSIS — D649 Anemia, unspecified: Secondary | ICD-10-CM | POA: Insufficient documentation

## 2015-12-13 DIAGNOSIS — R634 Abnormal weight loss: Secondary | ICD-10-CM | POA: Diagnosis not present

## 2015-12-13 DIAGNOSIS — R7989 Other specified abnormal findings of blood chemistry: Secondary | ICD-10-CM | POA: Insufficient documentation

## 2015-12-13 DIAGNOSIS — N281 Cyst of kidney, acquired: Secondary | ICD-10-CM | POA: Diagnosis not present

## 2015-12-13 DIAGNOSIS — R945 Abnormal results of liver function studies: Secondary | ICD-10-CM

## 2015-12-13 DIAGNOSIS — N2889 Other specified disorders of kidney and ureter: Secondary | ICD-10-CM | POA: Diagnosis not present

## 2015-12-13 DIAGNOSIS — R935 Abnormal findings on diagnostic imaging of other abdominal regions, including retroperitoneum: Secondary | ICD-10-CM

## 2015-12-13 DIAGNOSIS — K7689 Other specified diseases of liver: Secondary | ICD-10-CM | POA: Insufficient documentation

## 2015-12-13 MED ORDER — SODIUM CHLORIDE 0.9 % IJ SOLN
INTRAMUSCULAR | Status: AC
  Filled 2015-12-13: qty 500

## 2015-12-13 MED ORDER — GADOBENATE DIMEGLUMINE 529 MG/ML IV SOLN
10.0000 mL | Freq: Once | INTRAVENOUS | Status: AC | PRN
  Administered 2015-12-13: 10 mL via INTRAVENOUS

## 2015-12-13 MED ORDER — SODIUM CHLORIDE 0.9 % IV SOLN
INTRAVENOUS | Status: AC
  Filled 2015-12-13: qty 150

## 2016-01-08 ENCOUNTER — Other Ambulatory Visit: Payer: Self-pay

## 2016-01-08 ENCOUNTER — Encounter: Payer: Self-pay | Admitting: Nurse Practitioner

## 2016-01-08 ENCOUNTER — Ambulatory Visit (INDEPENDENT_AMBULATORY_CARE_PROVIDER_SITE_OTHER): Payer: Medicare Other | Admitting: Nurse Practitioner

## 2016-01-08 VITALS — BP 129/67 | HR 72 | Temp 97.3°F | Ht 65.0 in | Wt 123.6 lb

## 2016-01-08 DIAGNOSIS — D649 Anemia, unspecified: Secondary | ICD-10-CM

## 2016-01-08 DIAGNOSIS — R7989 Other specified abnormal findings of blood chemistry: Secondary | ICD-10-CM

## 2016-01-08 DIAGNOSIS — R799 Abnormal finding of blood chemistry, unspecified: Secondary | ICD-10-CM | POA: Diagnosis not present

## 2016-01-08 DIAGNOSIS — R634 Abnormal weight loss: Secondary | ICD-10-CM

## 2016-01-08 DIAGNOSIS — R945 Abnormal results of liver function studies: Secondary | ICD-10-CM

## 2016-01-08 MED ORDER — PEG 3350-KCL-NA BICARB-NACL 420 G PO SOLR: 4000.0000 mL | ORAL | Status: DC

## 2016-01-08 NOTE — Assessment & Plan Note (Signed)
Patient with elevated LFTs which are improved at his last office visit from previous levels. Interestingly his ferritin is elevated at almost 800. Also with an elevated ANA titer. At this point we'll check hemachromatosis genetics to evaluate for possible hemachromatosis as etiology for his newly increased ferritin, ANA positive, LFT abnormalities. Return for follow-up in 2 months.

## 2016-01-08 NOTE — Progress Notes (Signed)
cc'ed to pcp °

## 2016-01-08 NOTE — Progress Notes (Signed)
Referring Provider: Rosita Fire, MD Primary Care Physician:  Rosita Fire, MD Primary GI:  Dr. Gala Romney  Chief Complaint  Patient presents with  . Follow-up    HPI:   70 year old male presents for follow-up on weight loss, abnormal LFTs, anemia, rectal bleeding, abnormal CT of the abdomen. He was last seen in our office on 11/30/2015. At that point he noted a significant unintentional weight loss of 40 pounds over the previous 6 months, a single day/multi episode occurrence of rectal bleeding. At that time he stated he had a colonoscopy at [facility] the previous year. We requested these records although [facility] said he only had a history of endoscopy with dilation, no records of colonoscopy. Labs are ordered including workup for possible liver disease and autoimmune markers as well as CBC and CMP. MRI with Eovist was also ordered to follow-up on simple cysts noted in the liver. Autoimmune labs were mostly normal, although nonspecific ANA elevation with a titer of 1:1280. Hepatitis B, hepatitis C negative. Persistent elevation in AST/ALT but trending down from the previous result. Continued anemia but within his baseline for the previous couple months. Ceruloplasmin negative, ferritin elevated at 720.  Today he states he feels well overall. Still losing weight. Objectively lost 7 pounds in the past month based on previous office visit weight. Has mid-abdominal pain rarely, last pain about a month and a half ago. States his bowels with occasional color change to green. Denies hematochezia. Occasional very dark/black stools. Denies N/V in over 2 months. Most of the time his stools are watery. Denies chest pain, dyspnea. Admits occasional dizziness, seeing neurology. Denies any other upper or lower GI symptoms.   Past Medical History  Diagnosis Date  . Hypertension   . Elevated cholesterol   . Coronary artery disease     a. h/o MI in 2000 w/ prior LCX stenting;  b. 7.2014 Abnl  Cardiolite, EF 41%, mod-large inferolat scar;  c. 07/2013 Cath/PCI: LM nl, LAD 10-20, D1 40-50p, LCX 95-99 @ distal stent margin (2.5x20 Promus Premier DES), RCA dom, 40p, 21m, 70d, EF 35-40%.  . Tobacco user   . Ischemic cardiomyopathy     a. 07/2013 EF 35-40% by LV gram.  . Spondylosis of cervical joint     C3-4, C6-7    Past Surgical History  Procedure Laterality Date  . Coronary stent placement  2000  . Cardiac catheterization    . Transurethral resection of prostate  01/09/2012    Procedure: TRANSURETHRAL RESECTION OF THE PROSTATE (TURP);  Surgeon: Marissa Nestle, MD;  Location: [facility];  Service: Urology;  Laterality: N/A;  . Coronary angioplasty with stent placement  08/23/2013    mid circumflex  DES    by Dr Martinique  . Left heart catheterization with coronary angiogram N/A 08/23/2013    Procedure: LEFT HEART CATHETERIZATION WITH CORONARY ANGIOGRAM;  Surgeon: Peter M Martinique, MD;  Location: [facility] CATH LAB;  Service: Cardiovascular;  Laterality: N/A;    Current Outpatient Prescriptions  Medication Sig Dispense Refill  . amLODipine (NORVASC) 10 MG tablet Take 10 mg by mouth daily.      [name] aspirin 81 MG EC tablet Take 2 tablets (162 mg total) by mouth daily. (Patient taking differently: Take 81 mg by mouth daily. ) 30 tablet 1  . atorvastatin (LIPITOR) 40 MG tablet Take 40 mg by mouth at bedtime. Reported on 11/30/2015    . bisoprolol (ZEBETA) 10 MG tablet Take 0.5 tablets (5 mg total) by mouth daily.    [name]  lisinopril (PRINIVIL,ZESTRIL) 10 MG tablet Take 10 mg by mouth daily.     . methylPREDNISolone (MEDROL DOSEPAK) 4 MG TBPK tablet Take 4-8 mg by mouth See admin instructions. Tapered course:  1st day: take 2 tablets (8 mg) before breakfast, 1 tablet (4 mg) after lunch and supper and 2 tablets at bedtime, 2nd day, take 1 tablet before breakfast, after lunch & supper and 2 tablets at bedtime, day 3: take 1 tablet before breakfast, after lunch & supper and at bedtime, day 4: take 1 tablet before  breakfast, after lunch & at bedtime, day 5: take 1 tablet before breakfast and at bedtime, day 6, take 1 tablet before breakfast    . omeprazole (PRILOSEC) 20 MG capsule Take 20 mg by mouth daily.     . meclizine (ANTIVERT) 25 MG tablet Take 1 tablet (25 mg total) by mouth 3 (three) times daily as needed for dizziness. (Patient not taking: Reported on 01/08/2016) 15 tablet 0  . nitroGLYCERIN (NITROSTAT) 0.4 MG SL tablet Place 1 tablet (0.4 mg total) under the tongue every 5 (five) minutes as needed for chest pain. (Patient not taking: Reported on 11/11/2015) 25 tablet 3   No current facility-administered medications for this visit.    Allergies as of 01/08/2016  . (No Known Allergies)    Family History  Problem Relation Age of Onset  . Anesthesia problems Neg Hx   . Hypotension Neg Hx   . Malignant hyperthermia Neg Hx   . Pseudochol deficiency Neg Hx   . Colon cancer Neg Hx   . Liver disease Neg Hx     Social History   Social History  . Marital Status: Married    Spouse Name: N/A  . Number of Children: N/A  . Years of Education: N/A   Social History Main Topics  . Smoking status: Current Every Day Smoker -- 1.00 packs/day for 40 years    Types: Cigarettes    Start date: 12/09/1965  . Smokeless tobacco: Never Used  . Alcohol Use: No  . Drug Use: No  . Sexual Activity: Yes    Birth Control/ Protection: None   Other Topics Concern  . None   Social History Narrative   Pt gets regular exercise.    Review of Systems: General: Negative for fever, chills, fatigue, weakness. ENT: Negative for hoarseness, difficulty swallowing. CV: Negative for chest pain, angina, palpitations, peripheral edema.  Respiratory: Negative for dyspnea at rest, cough, sputum, wheezing.  GI: See history of present illness. Derm: Negative for rash or itching.  Endo: Negative for unusual weight change.  Heme: Negative for bruising or bleeding.   Physical Exam: BP 129/67 mmHg  Pulse 72   Temp(Src) 97.3 F (36.3 C)  Ht 5\' 5"  (1.651 m)  Wt 123 lb 9.6 oz (56.065 kg)  BMI 20.57 kg/m2 General:   Alert and oriented. Pleasant and cooperative. Well-nourished and well-developed.  Head:  Normocephalic and atraumatic. Eyes:  Without icterus, sclera clear and conjunctiva pink.  Ears:  Normal auditory acuity. Cardiovascular:  S1, S2 present without murmurs appreciated. Extremities without clubbing or edema. Respiratory:  Clear to auscultation bilaterally. No wheezes, rales, or rhonchi. No distress.  Gastrointestinal:  +BS, soft, non-tender and non-distended. No HSM noted. No guarding or rebound. No masses appreciated.  Rectal:  Deferred  Skin:  Intact without significant lesions or rashes. Neurologic:  Alert and oriented x4;  grossly normal neurologically. Psych:  Alert and cooperative. Normal mood and affect. Heme/Lymph/Immune: No excessive bruising noted.  01/08/2016 10:28 AM

## 2016-01-08 NOTE — Assessment & Plan Note (Addendum)
Patient with transaminitis and positive ANA titer with markedly increased ferritin at 720. This point we will order hemachromatosis genetics to evaluate as a possible etiology for his elevated ferritin and transaminitis. Return for follow-up in 2 months.

## 2016-01-08 NOTE — Assessment & Plan Note (Addendum)
Patient with significant weight loss. At last office visit he states he lost 40 pounds over the previous several months. Since his last office visit 1 month ago he has lost an additional 7 pounds objectively. Also having abnormal stools which are mostly loose and occasionally green in color. At this point we will arrange for colonoscopy and endoscopy for further evaluation as noted above. I'll also order fecal elastase to check for exocrine pancreatic insufficiency. Return for follow-up in 2 months.

## 2016-01-08 NOTE — Patient Instructions (Signed)
1. We'll schedule your procedures for you. 2. Have your labs drawn as soon are able to. You can wait until you have the stool sample to bring to the lab, and than have your blood drawn at the time you bring that there. 3. Further recommendations to be based on the results of your procedures. 4. Return for follow-up in 2 months.

## 2016-01-08 NOTE — Assessment & Plan Note (Signed)
Patient with mild and stable anemia, last hemoglobin 12.8. Endoscopy approximately 1-1/2 years ago which was performed at [facility] and found stricture in the proximal esophagus status post dilation. Recommend continue PPI, eat slowly and chew food well. No record of recent colonoscopy. At this point we will arrange for colonoscopy with possible endoscopy to further evaluate his anemia, weight loss, and abnormal stools.  Proceed with TCS +/- EGD with Dr. Gala Romney in near future: the risks, benefits, and alternatives have been discussed with the patient in detail. The patient states understanding and desires to proceed.  The patient is not on any anticoagulants, anxiolytics, chronic pain medications, or antidepressants. Conscious sedation should be adequate for his procedure.

## 2016-01-09 LAB — PANCREATIC ELASTASE, FECAL

## 2016-01-10 DIAGNOSIS — R945 Abnormal results of liver function studies: Secondary | ICD-10-CM | POA: Diagnosis not present

## 2016-01-10 DIAGNOSIS — I2510 Atherosclerotic heart disease of native coronary artery without angina pectoris: Secondary | ICD-10-CM | POA: Diagnosis not present

## 2016-01-10 DIAGNOSIS — R634 Abnormal weight loss: Secondary | ICD-10-CM | POA: Diagnosis not present

## 2016-01-10 DIAGNOSIS — F17290 Nicotine dependence, other tobacco product, uncomplicated: Secondary | ICD-10-CM | POA: Diagnosis not present

## 2016-01-13 ENCOUNTER — Emergency Department (HOSPITAL_COMMUNITY): Payer: Medicare Other

## 2016-01-13 ENCOUNTER — Emergency Department (HOSPITAL_COMMUNITY)
Admission: EM | Admit: 2016-01-13 | Discharge: 2016-01-14 | Disposition: A | Discharge status: Home / Self Care | Payer: Medicare Other | Attending: Family Medicine | Admitting: Family Medicine

## 2016-01-13 ENCOUNTER — Other Ambulatory Visit: Payer: Self-pay

## 2016-01-13 ENCOUNTER — Encounter (HOSPITAL_COMMUNITY): Payer: Self-pay | Admitting: Emergency Medicine

## 2016-01-13 DIAGNOSIS — Z9861 Coronary angioplasty status: Secondary | ICD-10-CM | POA: Insufficient documentation

## 2016-01-13 DIAGNOSIS — I2510 Atherosclerotic heart disease of native coronary artery without angina pectoris: Secondary | ICD-10-CM | POA: Diagnosis present

## 2016-01-13 DIAGNOSIS — Z7982 Long term (current) use of aspirin: Secondary | ICD-10-CM | POA: Diagnosis not present

## 2016-01-13 DIAGNOSIS — R5383 Other fatigue: Secondary | ICD-10-CM | POA: Diagnosis present

## 2016-01-13 DIAGNOSIS — Z79899 Other long term (current) drug therapy: Secondary | ICD-10-CM | POA: Insufficient documentation

## 2016-01-13 DIAGNOSIS — R531 Weakness: Secondary | ICD-10-CM | POA: Diagnosis not present

## 2016-01-13 DIAGNOSIS — M47812 Spondylosis without myelopathy or radiculopathy, cervical region: Secondary | ICD-10-CM | POA: Insufficient documentation

## 2016-01-13 DIAGNOSIS — R55 Syncope and collapse: Secondary | ICD-10-CM | POA: Diagnosis not present

## 2016-01-13 DIAGNOSIS — R634 Abnormal weight loss: Secondary | ICD-10-CM | POA: Diagnosis present

## 2016-01-13 DIAGNOSIS — I25119 Atherosclerotic heart disease of native coronary artery with unspecified angina pectoris: Secondary | ICD-10-CM | POA: Diagnosis present

## 2016-01-13 DIAGNOSIS — I10 Essential (primary) hypertension: Secondary | ICD-10-CM | POA: Diagnosis not present

## 2016-01-13 DIAGNOSIS — E7800 Pure hypercholesterolemia, unspecified: Secondary | ICD-10-CM | POA: Diagnosis not present

## 2016-01-13 DIAGNOSIS — Z9889 Other specified postprocedural states: Secondary | ICD-10-CM | POA: Diagnosis not present

## 2016-01-13 DIAGNOSIS — I255 Ischemic cardiomyopathy: Secondary | ICD-10-CM | POA: Diagnosis present

## 2016-01-13 LAB — COMPREHENSIVE METABOLIC PANEL
ALBUMIN: 3 g/dL — AB (ref 3.5–5.0)
ALT: 51 U/L (ref 17–63)
AST: 44 U/L — AB (ref 15–41)
Alkaline Phosphatase: 83 U/L (ref 38–126)
Anion gap: 7 (ref 5–15)
BUN: 13 mg/dL (ref 6–20)
CHLORIDE: 106 mmol/L (ref 101–111)
CO2: 29 mmol/L (ref 22–32)
CREATININE: 0.69 mg/dL (ref 0.61–1.24)
Calcium: 9.5 mg/dL (ref 8.9–10.3)
GFR calc non Af Amer: >60 mL/min (ref >60)
GLUCOSE: 129 mg/dL — AB (ref 65–99)
Potassium: 3.1 mmol/L — ABNORMAL LOW (ref 3.5–5.1)
SODIUM: 142 mmol/L (ref 135–145)
Total Bilirubin: 0.7 mg/dL (ref 0.3–1.2)
Total Protein: 6.3 g/dL — ABNORMAL LOW (ref 6.5–8.1)

## 2016-01-13 LAB — CBC WITH DIFFERENTIAL/PLATELET
BASOS ABS: 0 10*3/uL (ref 0.0–0.1)
BASOS PCT: 0 %
EOS ABS: 0.2 10*3/uL (ref 0.0–0.7)
EOS PCT: 2 %
HCT: 34.3 % — ABNORMAL LOW (ref 39.0–52.0)
HEMOGLOBIN: 11.4 g/dL — AB (ref 13.0–17.0)
Lymphocytes Relative: 36 %
Lymphs Abs: 3.6 10*3/uL (ref 0.7–4.0)
MCH: 27.8 pg (ref 26.0–34.0)
MCHC: 33.2 g/dL (ref 30.0–36.0)
MCV: 83.7 fL (ref 78.0–100.0)
Monocytes Absolute: 1 10*3/uL (ref 0.1–1.0)
Monocytes Relative: 10 %
NEUTROS PCT: 52 %
Neutro Abs: 5.2 10*3/uL (ref 1.7–7.7)
PLATELETS: 166 10*3/uL (ref 150–400)
RBC: 4.1 MIL/uL — AB (ref 4.22–5.81)
RDW: 14.1 % (ref 11.5–15.5)
WBC: 10 10*3/uL (ref 4.0–10.5)

## 2016-01-13 LAB — URINALYSIS, ROUTINE W REFLEX MICROSCOPIC
Bilirubin Urine: NEGATIVE
Glucose, UA: NEGATIVE mg/dL
Ketones, ur: NEGATIVE mg/dL
Nitrite: NEGATIVE
PH: 5.5 (ref 5.0–8.0)
Protein, ur: 100 mg/dL — AB
SPECIFIC GRAVITY, URINE: 1.025 (ref 1.005–1.030)

## 2016-01-13 LAB — URINE MICROSCOPIC-ADD ON: Squamous Epithelial / LPF: NONE SEEN

## 2016-01-13 LAB — TROPONIN I: TROPONIN I: 0.04 ng/mL — AB (ref <0.031)

## 2016-01-13 LAB — CBG MONITORING, ED
GLUCOSE-CAPILLARY: 127 mg/dL — AB (ref 65–99)
Glucose-Capillary: 133 mg/dL — ABNORMAL HIGH (ref 65–99)

## 2016-01-13 MED ORDER — POTASSIUM CHLORIDE 20 MEQ PO PACK
40.0000 meq | PACK | Freq: Once | ORAL | Status: DC
  Filled 2016-01-13: qty 2

## 2016-01-13 NOTE — ED Notes (Signed)
Patient c/o intermittent generalized weakness-in which patient states "legs gave out on him." Patient does report weakness has started since he has started to lose unintentional weight in which he is seeing a specialist. Patient reports some vomiting yesterday but denies any nausea or vomiting today. Denies any fevers. Per patient tremors and intermittent blurred vision. Denies any dizziness, slurred speech, or facial drooping.
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
## 2016-01-14 ENCOUNTER — Emergency Department (HOSPITAL_COMMUNITY): Payer: Medicare Other

## 2016-01-14 DIAGNOSIS — R55 Syncope and collapse: Secondary | ICD-10-CM | POA: Diagnosis not present

## 2016-01-14 DIAGNOSIS — R531 Weakness: Secondary | ICD-10-CM | POA: Insufficient documentation

## 2016-01-14 DIAGNOSIS — I2510 Atherosclerotic heart disease of native coronary artery without angina pectoris: Secondary | ICD-10-CM

## 2016-01-14 LAB — MAGNESIUM: MAGNESIUM: 1.2 mg/dL — AB (ref 1.7–2.4)

## 2016-01-14 LAB — TROPONIN I: Troponin I: 0.04 ng/mL — ABNORMAL HIGH (ref <0.031)

## 2016-01-14 LAB — CK: Total CK: 88 U/L (ref 49–397)

## 2016-01-14 MED ORDER — POTASSIUM CHLORIDE CRYS ER 20 MEQ PO TBCR
EXTENDED_RELEASE_TABLET | ORAL | Status: AC
  Filled 2016-01-14: qty 2

## 2016-01-14 MED ORDER — POTASSIUM CHLORIDE CRYS ER 20 MEQ PO TBCR
40.0000 meq | EXTENDED_RELEASE_TABLET | Freq: Once | ORAL | Status: AC
  Administered 2016-01-14: 40 meq via ORAL

## 2016-01-14 MED ORDER — POTASSIUM CHLORIDE CRYS ER 20 MEQ PO TBCR: 20.0000 meq | EXTENDED_RELEASE_TABLET | Freq: Two times a day (BID) | ORAL | Status: DC

## 2016-01-14 MED ORDER — MAGNESIUM OXIDE 400 (241.3 MG) MG PO TABS: 400.0000 mg | ORAL_TABLET | Freq: Two times a day (BID) | ORAL | Status: DC

## 2016-01-14 MED ORDER — MAGNESIUM OXIDE 400 (241.3 MG) MG PO TABS
400.0000 mg | ORAL_TABLET | Freq: Two times a day (BID) | ORAL | Status: DC
  Filled 2016-01-14 (×4): qty 1

## 2016-01-14 MED ORDER — MAGNESIUM SULFATE 2 GM/50ML IV SOLN
2.0000 g | Freq: Once | INTRAVENOUS | Status: AC
  Administered 2016-01-14: 2 g via INTRAVENOUS
  Filled 2016-01-14: qty 50
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
## 2016-01-14 NOTE — Discharge Instructions (Addendum)
Keep appointment with dr Legrand Rams on march 3

## 2016-01-15 LAB — HEMOCHROMATOSIS DNA-PCR(C282Y,H63D)

## 2016-01-25 ENCOUNTER — Ambulatory Visit (HOSPITAL_COMMUNITY)
Admission: RE | Admit: 2016-01-25 | Discharge: 2016-01-25 | Disposition: A | Discharge status: Home / Self Care | Payer: Medicare Other | Source: Ambulatory Visit | Attending: Internal Medicine | Admitting: Internal Medicine

## 2016-01-25 ENCOUNTER — Encounter (HOSPITAL_COMMUNITY)
Admission: RE | Disposition: A | Discharge status: Home / Self Care | Payer: Self-pay | Source: Ambulatory Visit | Attending: Internal Medicine

## 2016-01-25 ENCOUNTER — Encounter (HOSPITAL_COMMUNITY): Payer: Self-pay | Admitting: *Deleted

## 2016-01-25 DIAGNOSIS — R945 Abnormal results of liver function studies: Secondary | ICD-10-CM

## 2016-01-25 DIAGNOSIS — I10 Essential (primary) hypertension: Secondary | ICD-10-CM | POA: Insufficient documentation

## 2016-01-25 DIAGNOSIS — Z955 Presence of coronary angioplasty implant and graft: Secondary | ICD-10-CM | POA: Insufficient documentation

## 2016-01-25 DIAGNOSIS — K625 Hemorrhage of anus and rectum: Secondary | ICD-10-CM | POA: Insufficient documentation

## 2016-01-25 DIAGNOSIS — E7800 Pure hypercholesterolemia, unspecified: Secondary | ICD-10-CM | POA: Insufficient documentation

## 2016-01-25 DIAGNOSIS — Z7982 Long term (current) use of aspirin: Secondary | ICD-10-CM | POA: Insufficient documentation

## 2016-01-25 DIAGNOSIS — K648 Other hemorrhoids: Secondary | ICD-10-CM | POA: Diagnosis not present

## 2016-01-25 DIAGNOSIS — K449 Diaphragmatic hernia without obstruction or gangrene: Secondary | ICD-10-CM | POA: Insufficient documentation

## 2016-01-25 DIAGNOSIS — R1310 Dysphagia, unspecified: Secondary | ICD-10-CM | POA: Insufficient documentation

## 2016-01-25 DIAGNOSIS — F17210 Nicotine dependence, cigarettes, uncomplicated: Secondary | ICD-10-CM | POA: Insufficient documentation

## 2016-01-25 DIAGNOSIS — K209 Esophagitis, unspecified: Secondary | ICD-10-CM | POA: Diagnosis not present

## 2016-01-25 DIAGNOSIS — R634 Abnormal weight loss: Secondary | ICD-10-CM | POA: Insufficient documentation

## 2016-01-25 DIAGNOSIS — R7989 Other specified abnormal findings of blood chemistry: Secondary | ICD-10-CM | POA: Insufficient documentation

## 2016-01-25 DIAGNOSIS — I2510 Atherosclerotic heart disease of native coronary artery without angina pectoris: Secondary | ICD-10-CM | POA: Insufficient documentation

## 2016-01-25 DIAGNOSIS — D649 Anemia, unspecified: Secondary | ICD-10-CM | POA: Insufficient documentation

## 2016-01-25 DIAGNOSIS — I255 Ischemic cardiomyopathy: Secondary | ICD-10-CM | POA: Diagnosis not present

## 2016-01-25 DIAGNOSIS — Z79899 Other long term (current) drug therapy: Secondary | ICD-10-CM | POA: Insufficient documentation

## 2016-01-25 DIAGNOSIS — Q394 Esophageal web: Secondary | ICD-10-CM | POA: Diagnosis not present

## 2016-01-25 HISTORY — PX: COLONOSCOPY: SHX5424

## 2016-01-25 HISTORY — PX: ESOPHAGOGASTRODUODENOSCOPY: SHX5428

## 2016-01-25 SURGERY — COLONOSCOPY
Anesthesia: Moderate Sedation

## 2016-01-25 MED ORDER — MEPERIDINE HCL 100 MG/ML IJ SOLN
INTRAMUSCULAR | Status: AC
  Filled 2016-01-25: qty 2

## 2016-01-25 MED ORDER — ONDANSETRON HCL 4 MG/2ML IJ SOLN
INTRAMUSCULAR | Status: AC
  Filled 2016-01-25: qty 2

## 2016-01-25 MED ORDER — SODIUM CHLORIDE 0.9 % IV SOLN
INTRAVENOUS | Status: DC
  Administered 2016-01-25: 07:00:00 via INTRAVENOUS

## 2016-01-25 MED ORDER — ONDANSETRON HCL 4 MG/2ML IJ SOLN
INTRAMUSCULAR | Status: DC | PRN
  Administered 2016-01-25: 4 mg via INTRAVENOUS

## 2016-01-25 MED ORDER — LIDOCAINE VISCOUS 2 % MT SOLN
OROMUCOSAL | Status: DC | PRN
  Administered 2016-01-25: 3 mL via OROMUCOSAL

## 2016-01-25 MED ORDER — MIDAZOLAM HCL 5 MG/5ML IJ SOLN
INTRAMUSCULAR | Status: AC
  Filled 2016-01-25: qty 10

## 2016-01-25 MED ORDER — MIDAZOLAM HCL 5 MG/5ML IJ SOLN
INTRAMUSCULAR | Status: DC | PRN
  Administered 2016-01-25 (×2): 1 mg via INTRAVENOUS
  Administered 2016-01-25: 2 mg via INTRAVENOUS
  Administered 2016-01-25: 1 mg via INTRAVENOUS

## 2016-01-25 MED ORDER — LIDOCAINE VISCOUS 2 % MT SOLN
OROMUCOSAL | Status: AC
  Filled 2016-01-25: qty 15

## 2016-01-25 MED ORDER — STERILE WATER FOR IRRIGATION IR SOLN
Status: DC | PRN
  Administered 2016-01-25: 08:00:00

## 2016-01-25 MED ORDER — MEPERIDINE HCL 100 MG/ML IJ SOLN
INTRAMUSCULAR | Status: DC | PRN
  Administered 2016-01-25: 50 mg via INTRAVENOUS
  Administered 2016-01-25 (×2): 25 mg via INTRAVENOUS

## 2016-01-25 NOTE — H&P (View-Only) (Signed)
Referring Provider: Rosita Fire, MD Primary Care Physician:  Rosita Fire, MD Primary GI:  Dr. Gala Romney  Chief Complaint  Patient presents with  . Follow-up    HPI:   70 year old male presents for follow-up on weight loss, abnormal LFTs, anemia, rectal bleeding, abnormal CT of the abdomen. He was last seen in our office on 11/30/2015. At that point he noted a significant unintentional weight loss of 40 pounds over the previous 6 months, a single day/multi episode occurrence of rectal bleeding. At that time he stated he had a colonoscopy at [facility] the previous year. We requested these records although [facility] said he only had a history of endoscopy with dilation, no records of colonoscopy. Labs are ordered including workup for possible liver disease and autoimmune markers as well as CBC and CMP. MRI with [facility] was also ordered to follow-up on simple cysts noted in the liver. Autoimmune labs were mostly normal, although nonspecific ANA elevation with a titer of 1:1280. Hepatitis B, hepatitis C negative. Persistent elevation in AST/ALT but trending down from the previous result. Continued anemia but within his baseline for the previous couple months. Ceruloplasmin negative, ferritin elevated at 720.  Today he states he feels well overall. Still losing weight. Objectively lost 7 pounds in the past month based on previous office visit weight. Has mid-abdominal pain rarely, last pain about a month and a half ago. States his bowels with occasional color change to green. Denies hematochezia. Occasional very dark/black stools. Denies N/V in over 2 months. Most of the time his stools are watery. Denies chest pain, dyspnea. Admits occasional dizziness, seeing neurology. Denies any other upper or lower GI symptoms.   Past Medical History  Diagnosis Date  . Hypertension   . Elevated cholesterol   . Coronary artery disease     a. h/o MI in 2000 w/ prior LCX stenting;  b. 7.2014 Abnl  Cardiolite, EF 41%, mod-large inferolat scar;  c. 07/2013 Cath/PCI: LM nl, LAD 10-20, D1 40-50p, LCX 95-99 @ distal stent margin (2.5x20 Promus Premier DES), RCA dom, 40p, 1m, 70d, EF 35-40%.  . Tobacco user   . Ischemic cardiomyopathy     a. 07/2013 EF 35-40% by LV gram.  . Spondylosis of cervical joint     C3-4, C6-7    Past Surgical History  Procedure Laterality Date  . Coronary stent placement  2000  . Cardiac catheterization    . Transurethral resection of prostate  01/09/2012    Procedure: TRANSURETHRAL RESECTION OF THE PROSTATE (TURP);  Surgeon: Marissa Nestle, MD;  Location: [facility];  Service: Urology;  Laterality: N/A;  . Coronary angioplasty with stent placement  08/23/2013    mid circumflex  DES    by Dr Martinique  . Left heart catheterization with coronary angiogram N/A 08/23/2013    Procedure: LEFT HEART CATHETERIZATION WITH CORONARY ANGIOGRAM;  Surgeon: Peter M Martinique, MD;  Location: [facility] CATH LAB;  Service: Cardiovascular;  Laterality: N/A;    Current Outpatient Prescriptions  Medication Sig Dispense Refill  . amLODipine (NORVASC) 10 MG tablet Take 10 mg by mouth daily.      [name] aspirin 81 MG EC tablet Take 2 tablets (162 mg total) by mouth daily. (Patient taking differently: Take 81 mg by mouth daily. ) 30 tablet 1  . atorvastatin (LIPITOR) 40 MG tablet Take 40 mg by mouth at bedtime. Reported on 11/30/2015    . bisoprolol (ZEBETA) 10 MG tablet Take 0.5 tablets (5 mg total) by mouth daily.    [name]  lisinopril (PRINIVIL,ZESTRIL) 10 MG tablet Take 10 mg by mouth daily.     . methylPREDNISolone (MEDROL DOSEPAK) 4 MG TBPK tablet Take 4-8 mg by mouth See admin instructions. Tapered course:  1st day: take 2 tablets (8 mg) before breakfast, 1 tablet (4 mg) after lunch and supper and 2 tablets at bedtime, 2nd day, take 1 tablet before breakfast, after lunch & supper and 2 tablets at bedtime, day 3: take 1 tablet before breakfast, after lunch & supper and at bedtime, day 4: take 1 tablet before  breakfast, after lunch & at bedtime, day 5: take 1 tablet before breakfast and at bedtime, day 6, take 1 tablet before breakfast    . omeprazole (PRILOSEC) 20 MG capsule Take 20 mg by mouth daily.     . meclizine (ANTIVERT) 25 MG tablet Take 1 tablet (25 mg total) by mouth 3 (three) times daily as needed for dizziness. (Patient not taking: Reported on 01/08/2016) 15 tablet 0  . nitroGLYCERIN (NITROSTAT) 0.4 MG SL tablet Place 1 tablet (0.4 mg total) under the tongue every 5 (five) minutes as needed for chest pain. (Patient not taking: Reported on 11/11/2015) 25 tablet 3   No current facility-administered medications for this visit.    Allergies as of 01/08/2016  . (No Known Allergies)    Family History  Problem Relation Age of Onset  . Anesthesia problems Neg Hx   . Hypotension Neg Hx   . Malignant hyperthermia Neg Hx   . Pseudochol deficiency Neg Hx   . Colon cancer Neg Hx   . Liver disease Neg Hx     Social History   Social History  . Marital Status: Married    Spouse Name: N/A  . Number of Children: N/A  . Years of Education: N/A   Social History Main Topics  . Smoking status: Current Every Day Smoker -- 1.00 packs/day for 40 years    Types: Cigarettes    Start date: 12/09/1965  . Smokeless tobacco: Never Used  . Alcohol Use: No  . Drug Use: No  . Sexual Activity: Yes    Birth Control/ Protection: None   Other Topics Concern  . None   Social History Narrative   Pt gets regular exercise.    Review of Systems: General: Negative for fever, chills, fatigue, weakness. ENT: Negative for hoarseness, difficulty swallowing. CV: Negative for chest pain, angina, palpitations, peripheral edema.  Respiratory: Negative for dyspnea at rest, cough, sputum, wheezing.  GI: See history of present illness. Derm: Negative for rash or itching.  Endo: Negative for unusual weight change.  Heme: Negative for bruising or bleeding.   Physical Exam: BP 129/67 mmHg  Pulse 72   Temp(Src) 97.3 F (36.3 C)  Ht 5\' 5"  (1.651 m)  Wt 123 lb 9.6 oz (56.065 kg)  BMI 20.57 kg/m2 General:   Alert and oriented. Pleasant and cooperative. Well-nourished and well-developed.  Head:  Normocephalic and atraumatic. Eyes:  Without icterus, sclera clear and conjunctiva pink.  Ears:  Normal auditory acuity. Cardiovascular:  S1, S2 present without murmurs appreciated. Extremities without clubbing or edema. Respiratory:  Clear to auscultation bilaterally. No wheezes, rales, or rhonchi. No distress.  Gastrointestinal:  +BS, soft, non-tender and non-distended. No HSM noted. No guarding or rebound. No masses appreciated.  Rectal:  Deferred  Skin:  Intact without significant lesions or rashes. Neurologic:  Alert and oriented x4;  grossly normal neurologically. Psych:  Alert and cooperative. Normal mood and affect. Heme/Lymph/Immune: No excessive bruising noted.  01/08/2016 10:28 AM

## 2016-01-25 NOTE — Op Note (Signed)
[facility] [address] [place], [address]   COLONOSCOPY PROCEDURE REPORT  PATIENT: Ronald Mcdowell, Ronald Mcdowell  MR#: [id number] BIRTHDATE: October 23, 1946 , 48  yrs. old GENDER: male ENDOSCOPIST: R.  Garfield Cornea, MD FACP [facility] REFERRED [id number] Legrand Rams, M.D. PROCEDURE DATE:  26-Jan-2016 PROCEDURE:   Ileo-colonoscopy, diagnostic INDICATIONS:Weight loss; rectal bleeding. MEDICATIONS: Versed 4 mg IV and Demerol 75 mg IV in divided doses. Zofran 4 mg IV. ASA CLASS:       Class III  CONSENT: The risks, benefits, alternatives and imponderables including but not limited to bleeding, perforation as well as the possibility of a missed lesion have been reviewed.  The potential for biopsy, lesion removal, etc. have also been discussed. Questions have been answered.  All parties agreeable.  Please see the history and physical in the medical record for more information.  DESCRIPTION OF PROCEDURE:   After the risks benefits and alternatives of the procedure were thoroughly explained, informed consent was obtained.  The digital rectal exam revealed no abnormalities of the rectum.   The [id number]  endoscope was introduced through the anus and advanced to the terminal ileum which was intubated for a short distance. No adverse events experienced.   The quality of the prep was adequate  The instrument was then slowly withdrawn as the colon was fully examined. Estimated blood loss is zero unless otherwise noted in this procedure report.      COLON FINDINGS: Normal-appearing rectal mucosa aside from minimal anal canal hemorrhoids.  Normal-appearing colonic mucosa.  The distal 10 cm of terminal ileal mucosa also appeared normal. Retroflexion was performed. .  Withdrawal time=7 minutes 0 seconds.  The scope was withdrawn and the procedure completed. COMPLICATIONS: There were no immediate complications.  ENDOSCOPIC IMPRESSION: Normal ileocolonoscopy (anal canal  hemorrhoids)  RECOMMENDATIONS: See EGD report.  eSigned:  R. Garfield Cornea, MD Rosalita Chessman [facility] 01-26-2016 7:57 AM   cc:  CPT CODES: ICD CODES:  The ICD and CPT codes recommended by this software are interpretations from the data that the clinical staff has captured with the software.  The verification of the translation of this report to the ICD and CPT codes and modifiers is the sole responsibility of the health care institution and practicing physician where this report was generated.  [employer]. will not be held responsible for the validity of the ICD and CPT codes included on this report.  AMA assumes no liability for data contained or not contained herein. CPT is a Designer, television/film set of the [employer].

## 2016-01-25 NOTE — Interval H&P Note (Signed)
History and Physical Interval Note:  01/25/2016 7:26 AM  Ronald Mcdowell  has presented today for surgery, with the diagnosis of anemia/elevated LFT'S  The various methods of treatment have been discussed with the patient and family. After consideration of risks, benefits and other options for treatment, the patient has consented to  Procedure(s) with comments: COLONOSCOPY (N/A) - 230 - moved to 7:30 - office notified pt ESOPHAGOGASTRODUODENOSCOPY (EGD) (N/A) as a surgical intervention .  The patient's history has been reviewed, patient examined, no change in status, stable for surgery.  I have reviewed the patient's chart and labs.  Questions were answered to the patient's satisfaction.     Ronald Mcdowell  No change; wt loss; rectal bleeding ; dysphagia; need egd w ed and tcs.  The risks, benefits, limitations, imponderables and alternatives regarding both EGD and colonoscopy have been reviewed with the patient. Questions have been answered. All parties agreeable.

## 2016-01-25 NOTE — Op Note (Signed)
[facility] [address] [place], [address]   ENDOSCOPY PROCEDURE REPORT  PATIENT: Ronald Mcdowell, Ronald Mcdowell  MR#: [id number] BIRTHDATE: May 14, 1946 , 21  yrs. old GENDER: male ENDOSCOPIST: R.  Garfield Cornea, MD FACP [facility] REFERRED BY:  Conni Slipper, M.D. PROCEDURE DATE:  Feb 04, 2016 PROCEDURE:  EGD w/ biopsy INDICATIONS:  Esophageal dysphagia; weight loss. MEDICATIONS: Versed 5 mg IV and Demerol 100 mg IV in divided doses. Xylocaine gel orally.  Zofran 4 mg IV. ASA CLASS:      Class III  CONSENT: The risks, benefits, limitations, alternatives and imponderables have been discussed.  The potential for biopsy, esophogeal dilation, etc. have also been reviewed.  Questions have been answered.  All parties agreeable.  Please see the history and physical in the medical record for more information.  DESCRIPTION OF PROCEDURE: After the risks benefits and alternatives of the procedure were thoroughly explained, informed consent was obtained.  The [id number] endoscope was introduced through the mouth and advanced to the second portion of the duodenum , limited by Without limitations. The instrument was slowly withdrawn as the mucosa was fully examined. Estimated blood loss is zero unless otherwise noted in this procedure report.    Tight cervical web ?"initially would not admit the scope however, with gentle pressure it popped through   -  the scope easily traversed the esophagus ; stomach empty 2 cm hiatal hernia.  Normal gastric mucosa.  Patent pylorus.  Normal-appearing first and second portion of the duodenum  I pulled the scope back in the esophagus -  inspected further. patient did have distal esophageal erosions and multiple shallow pseudodiverticulum involving the distal one third tubular esophagus there were 4 superficial tears in the mid body and a semilunar superficial tear through the upper esophageal mucosa.  This was all done with passage of the  diagnostic gastroscope only.  No further dedicated dilation attempted.  Biopsies of the mid and distal esophagus taken for histologic study.  Retroflexed views revealed as previously described.     The scope was then withdrawn from the patient and the procedure completed.  COMPLICATIONS: There were no immediate complications.  ENDOSCOPIC IMPRESSION: Reflux esophagitis. Cervical web?" with passage of the scope. Status post esophageal biopsy. Hiatal hernia.  RECOMMENDATIONS: Follow up on pathology.  REPEAT EXAM:  eSigned:  R. Garfield Cornea, MD Rosalita Chessman [facility] 02/04/2016 8:14 AM    CC:  CPT CODES: ICD CODES:  The ICD and CPT codes recommended by this software are interpretations from the data that the clinical staff has captured with the software.  The verification of the translation of this report to the ICD and CPT codes and modifiers is the sole responsibility of the health care institution and practicing physician where this report was generated.  [employer]. will not be held responsible for the validity of the ICD and CPT codes included on this report.  AMA assumes no liability for data contained or not contained herein. CPT is a Designer, television/film set of the [employer].  PATIENT NAME:  [name], [name] MR#: [id number]

## 2016-01-27 ENCOUNTER — Encounter: Payer: Self-pay | Admitting: Internal Medicine

## 2016-01-28 ENCOUNTER — Encounter (HOSPITAL_COMMUNITY): Payer: Self-pay | Admitting: Emergency Medicine

## 2016-01-28 ENCOUNTER — Emergency Department (HOSPITAL_COMMUNITY)
Admission: EM | Admit: 2016-01-28 | Discharge: 2016-01-28 | Disposition: A | Discharge status: Home / Self Care | Payer: Medicare Other | Attending: Emergency Medicine | Admitting: Emergency Medicine

## 2016-01-28 ENCOUNTER — Emergency Department (HOSPITAL_COMMUNITY): Payer: Medicare Other

## 2016-01-28 DIAGNOSIS — K5641 Fecal impaction: Secondary | ICD-10-CM

## 2016-01-28 DIAGNOSIS — I10 Essential (primary) hypertension: Secondary | ICD-10-CM | POA: Diagnosis not present

## 2016-01-28 DIAGNOSIS — I2510 Atherosclerotic heart disease of native coronary artery without angina pectoris: Secondary | ICD-10-CM | POA: Insufficient documentation

## 2016-01-28 DIAGNOSIS — Z79899 Other long term (current) drug therapy: Secondary | ICD-10-CM | POA: Diagnosis not present

## 2016-01-28 DIAGNOSIS — E7800 Pure hypercholesterolemia, unspecified: Secondary | ICD-10-CM | POA: Insufficient documentation

## 2016-01-28 DIAGNOSIS — F17210 Nicotine dependence, cigarettes, uncomplicated: Secondary | ICD-10-CM | POA: Diagnosis not present

## 2016-01-28 DIAGNOSIS — K6289 Other specified diseases of anus and rectum: Secondary | ICD-10-CM | POA: Diagnosis not present

## 2016-01-28 DIAGNOSIS — Z7982 Long term (current) use of aspirin: Secondary | ICD-10-CM | POA: Insufficient documentation

## 2016-01-28 HISTORY — DX: Gastro-esophageal reflux disease with esophagitis, without bleeding: K21.00

## 2016-01-28 HISTORY — DX: Unspecified hemorrhoids: K64.9

## 2016-01-28 HISTORY — DX: Gastro-esophageal reflux disease with esophagitis: K21.0

## 2016-01-28 NOTE — Discharge Instructions (Signed)
Take an over the counter dulcolax suppository (or enema) today and repeat both tomorrow if needed.  Begin to take over the counter stool softener (colace, miralax), as directed on packaging, for the next month.  Continue to take your usual prescriptions as previously directed.  Call your regular GI doctor tomorrow to schedule a follow up appointment within the next 2 days.  Return to the Emergency Department immediately if worsening.
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
## 2016-01-28 NOTE — ED Notes (Signed)
Patient c/o rectal pain that started last night. Per patient starts to have a BM but pain starts mid-way through and is unable to have BM. Patient reports colonoscopy on Thursday in which he has had a normal BM since with no pain. Patient denies any blood noted.

## 2016-01-28 NOTE — Discharge Instructions (Signed)
Colonoscopy Discharge Instructions  Read the instructions outlined below and refer to this sheet in the next few weeks. These discharge instructions provide you with general information on caring for yourself after you leave the hospital. Your doctor may also give you specific instructions. While your treatment has been planned according to the most current medical practices available, unavoidable complications occasionally occur. If you have any problems or questions after discharge, call Dr. Gala Romney at [phone]. ACTIVITY  You may resume your regular activity, but move at a slower pace for the next 24 hours.   Take frequent rest periods for the next 24 hours.   Walking will help get rid of the air and reduce the bloated feeling in your belly (abdomen).   No driving for 24 hours (because of the medicine (anesthesia) used during the test).    Do not sign any important legal documents or operate any machinery for 24 hours (because of the anesthesia used during the test).  NUTRITION  Drink plenty of fluids.   You may resume your normal diet as instructed by your doctor.   Begin with a light meal and progress to your normal diet. Heavy or fried foods are harder to digest and may make you feel sick to your stomach (nauseated).   Avoid alcoholic beverages for 24 hours or as instructed.  MEDICATIONS  You may resume your normal medications unless your doctor tells you otherwise.  WHAT YOU CAN EXPECT TODAY  Some feelings of bloating in the abdomen.   Passage of more gas than usual.   Spotting of blood in your stool or on the toilet paper.  IF YOU HAD POLYPS REMOVED DURING THE COLONOSCOPY:  No aspirin products for 7 days or as instructed.   No alcohol for 7 days or as instructed.   Eat a soft diet for the next 24 hours.  FINDING OUT THE RESULTS OF YOUR TEST Not all test results are available during your visit. If your test results are not back during the visit, make an appointment  with your caregiver to find out the results. Do not assume everything is normal if you have not heard from your caregiver or the medical facility. It is important for you to follow up on all of your test results.  SEEK IMMEDIATE MEDICAL ATTENTION IF:  You have more than a spotting of blood in your stool.   Your belly is swollen (abdominal distention).   You are nauseated or vomiting.   You have a temperature over 101.  You have abdominal pain or discomfort that is severe or gets worse throughout the day. EGD Discharge instructions Please read the instructions outlined below and refer to this sheet in the next few weeks. These discharge instructions provide you with general information on caring for yourself after you leave the hospital. Your doctor may also give you specific instructions. While your treatment has been planned according to the most current medical practices available, unavoidable complications occasionally occur. If you have any problems or questions after discharge, please call your doctor. ACTIVITY You may resume your regular activity but move at a slower pace for the next 24 hours.  Take frequent rest periods for the next 24 hours.  Walking will help expel (get rid of) the air and reduce the bloated feeling in your abdomen.  No driving for 24 hours (because of the anesthesia (medicine) used during the test).  You may shower.  Do not sign any important legal documents or operate any machinery for 24  any important legal documents or operate any machinery for 24 hours (because of the anesthesia used during the test).  °NUTRITION °· Drink plenty of fluids.  °· You may resume your normal diet.  °· Begin with a light meal and progress to your normal diet.  °· Avoid alcoholic beverages for 24 hours or as instructed by your caregiver.  °MEDICATIONS °· You may resume your normal medications unless your caregiver tells you otherwise.  °WHAT YOU CAN EXPECT TODAY °· You may experience abdominal discomfort such as a feeling of  fullness or “gas” pains.  °FOLLOW-UP °· Your doctor will discuss the results of your test with you.  °SEEK IMMEDIATE MEDICAL ATTENTION IF ANY OF THE FOLLOWING OCCUR: °· Excessive nausea (feeling sick to your stomach) and/or vomiting.  °· Severe abdominal pain and distention (swelling).  °· Trouble swallowing.  °· Temperature over 101° F (37.8º C).  °· Rectal bleeding or vomiting of blood.  ° ° °Further recommendations to follow pending review of pathology report ° °

## 2016-01-29 ENCOUNTER — Telehealth: Payer: Self-pay

## 2016-01-29 LAB — POC OCCULT BLOOD, ED: Fecal Occult Bld: NEGATIVE

## 2016-01-29 NOTE — Telephone Encounter (Signed)
Letter mailed to the pt. 

## 2016-01-29 NOTE — Telephone Encounter (Signed)
Per RMR-  Send letter to patient. Needs ov in 8 weeks to re-assess everything. Begin protonix 40 mg daily disp 30 w 11 refills Send copy of letter with path to referring provider and PCP.

## 2016-01-30 ENCOUNTER — Encounter (HOSPITAL_COMMUNITY): Payer: Self-pay | Admitting: Internal Medicine

## 2016-01-30 NOTE — Telephone Encounter (Signed)
Pt scheduled for follow up ov

## 2016-01-30 NOTE — Telephone Encounter (Signed)
Pt is aware of results and his appointment

## 2016-01-30 NOTE — Telephone Encounter (Signed)
Tried to call pt- Ronald Mcdowell for him to return call.

## 2016-02-05 MED ORDER — PANTOPRAZOLE SODIUM 40 MG PO TBEC: 40.0000 mg | DELAYED_RELEASE_TABLET | Freq: Every day | ORAL | Status: DC

## 2016-02-08 DIAGNOSIS — E784 Other hyperlipidemia: Secondary | ICD-10-CM | POA: Diagnosis not present

## 2016-02-08 DIAGNOSIS — I10 Essential (primary) hypertension: Secondary | ICD-10-CM | POA: Diagnosis not present

## 2016-02-08 DIAGNOSIS — I2510 Atherosclerotic heart disease of native coronary artery without angina pectoris: Secondary | ICD-10-CM | POA: Diagnosis not present

## 2016-02-14 DIAGNOSIS — N529 Male erectile dysfunction, unspecified: Secondary | ICD-10-CM | POA: Diagnosis not present

## 2016-02-14 DIAGNOSIS — C61 Malignant neoplasm of prostate: Secondary | ICD-10-CM | POA: Diagnosis not present

## 2016-03-04 ENCOUNTER — Ambulatory Visit (INDEPENDENT_AMBULATORY_CARE_PROVIDER_SITE_OTHER): Payer: Medicare Other | Admitting: Cardiovascular Disease

## 2016-03-04 ENCOUNTER — Encounter: Payer: Self-pay | Admitting: Cardiovascular Disease

## 2016-03-04 VITALS — BP 142/76 | HR 68 | Ht 65.0 in | Wt 137.0 lb

## 2016-03-04 DIAGNOSIS — I519 Heart disease, unspecified: Secondary | ICD-10-CM

## 2016-03-04 DIAGNOSIS — I2510 Atherosclerotic heart disease of native coronary artery without angina pectoris: Secondary | ICD-10-CM

## 2016-03-04 DIAGNOSIS — E785 Hyperlipidemia, unspecified: Secondary | ICD-10-CM

## 2016-03-04 DIAGNOSIS — I10 Essential (primary) hypertension: Secondary | ICD-10-CM

## 2016-03-04 DIAGNOSIS — I255 Ischemic cardiomyopathy: Secondary | ICD-10-CM | POA: Diagnosis not present

## 2016-03-04 DIAGNOSIS — Z720 Tobacco use: Secondary | ICD-10-CM

## 2016-03-04 NOTE — Progress Notes (Signed)
Patient ID: Ronald Mcdowell, male   DOB: [birth year], 70 y.o.   MRN: [id number]      SUBJECTIVE: The patient presents for routine cardiovascular followup. He has a history of coronary artery disease with stenting of the left circumflex coronary artery in September 2014. At that time he was noted to have significant RCA disease, with a 40% stenosis in the proximal vessel followed by a long 80% stenosis in the proximal to mid vessel. The distal vessel was diffusely diseased to 70%. It was noted at that time that if he were to have recalcitrant chest pain refractory to medical therapy, the RCA would require extensive stenting. He also has hypertension, hyperlipidemia, tobacco abuse, and an ischemic cardiomyopathy.  Echocardiogram on 04/11/15 showed mild left ventricular dilatation with mildly reduced left ventricular systolic function, EF [id number] , inferior and lateral wall motion abnormalities, and insignificant valvular regurgitation with moderate left atrial dilatation.  The patient denies any symptoms of chest pain, palpitations, shortness of breath, lightheadedness, dizziness, leg swelling, orthopnea, PND, and syncope. He is putting weight back on.  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 Lived in [place] for over 60 yrs, moved to [facility] in 2009.  Review of Systems: As per "subjective", otherwise negative.  No Known Allergies  Current Outpatient Prescriptions  Medication Sig Dispense Refill  . amLODipine (NORVASC) 10 MG tablet Take 10 mg by mouth daily.      [name] aspirin 81 MG EC tablet Take 2 tablets (162 mg total) by mouth daily. (Patient taking differently: Take 81 mg by mouth daily. ) 30 tablet 1  . atorvastatin (LIPITOR) 40 MG tablet Take 40 mg by mouth at bedtime. Reported on 11/30/2015    . bisoprolol (ZEBETA) 10 MG tablet Take 0.5 tablets (5 mg total) by mouth daily.    [name] lisinopril (PRINIVIL,ZESTRIL) 10 MG tablet Take 10 mg by mouth daily.     [name] omeprazole  (PRILOSEC) 20 MG capsule Take 20 mg by mouth daily.      No current facility-administered medications for this visit.    Past Medical History  Diagnosis Date  . Hypertension   . Elevated cholesterol   . Coronary artery disease     a. h/o MI in 2000 w/ prior LCX stenting;  b. 7.2014 Abnl Cardiolite, EF 41%, mod-large inferolat scar;  c. 07/2013 Cath/PCI: LM nl, LAD 10-20, D1 40-50p, LCX 95-99 @ distal stent margin (2.5x20 Promus Premier DES), RCA dom, 40p, 76m, 70d, EF 35-40%.  . Tobacco user   . Ischemic cardiomyopathy     a. 07/2013 EF 35-40% by LV gram.  . Spondylosis of cervical joint     C3-4, C6-7  . Reflux esophagitis   . Hemorrhoids     Past Surgical History  Procedure Laterality Date  . Coronary stent placement  2000  . Cardiac catheterization    . Transurethral resection of prostate  01/09/2012    Procedure: TRANSURETHRAL RESECTION OF THE PROSTATE (TURP);  Surgeon: Marissa Nestle, MD;  Location: [facility];  Service: Urology;  Laterality: N/A;  . Coronary angioplasty with stent placement  08/23/2013    mid circumflex  DES    by Dr Martinique  . Left heart catheterization with coronary angiogram N/A 08/23/2013    Procedure: LEFT HEART CATHETERIZATION WITH CORONARY ANGIOGRAM;  Surgeon: Peter M Martinique, MD;  Location: [facility] CATH LAB;  Service: Cardiovascular;  Laterality: N/A;  . Colonoscopy    . Colonoscopy N/A 01/25/2016  Procedure: COLONOSCOPY;  Surgeon: Daneil Dolin, MD;  Location: [facility];  Service: Endoscopy;  Laterality: N/A;  230 - moved to 7:30 - office notified pt  . Esophagogastroduodenoscopy N/A 01/25/2016    Procedure: ESOPHAGOGASTRODUODENOSCOPY (EGD);  Surgeon: Daneil Dolin, MD;  Location: [facility];  Service: Endoscopy;  Laterality: N/A;    Social History   Social History  . Marital Status: Married    Spouse Name: N/A  . Number of Children: N/A  . Years of Education: N/A   Occupational History  . Not on file.   Social History Main Topics  . Smoking  status: Current Every Day Smoker -- 1.00 packs/day for 40 years    Types: Cigarettes    Start date: 12/09/1965  . Smokeless tobacco: Never Used  . Alcohol Use: No  . Drug Use: No  . Sexual Activity: Yes    Birth Control/ Protection: None   Other Topics Concern  . Not on file   Social History Narrative   Pt gets regular exercise.     Filed Vitals:   03/04/16 0813  BP: 142/76  Pulse: 68  Height: 5\' 5"  (1.651 m)  Weight: 137 lb (62.143 kg)  SpO2: 98%    PHYSICAL EXAM General: NAD HEENT: Normal. Neck: No JVD, no thyromegaly. Lungs: Clear to auscultation bilaterally with normal respiratory effort. CV: Nondisplaced PMI.  Regular rate and rhythm, normal S1/S2, no S3/S4, no murmur. No pretibial or periankle edema.  No carotid bruit.   Abdomen: Soft, nontender, no distention.  Neurologic: Alert and oriented.  Psych: Normal affect. Skin: Normal. Musculoskeletal: No gross deformities.  ECG: Most recent ECG reviewed.      ASSESSMENT AND PLAN: 1. CAD: Stable ischemic heart disease. Continue current therapy with ASA, Lipitor, and bisoprolol.  2. Essential HTN: Mildly elevated on current therapy which includes amlodipine 10 mg and lisinopril 10 mg. No changes.  3. Hyperlipidemia: Lipid panel on 04/10/15 showed TC 130, TG 56, HDL 32, LDL 87. Continue Lipitor 40 mg hs. Will repeat lipids.  4. Tobacco abuse history: Again says he wants to quit. Smokes <1 ppd.  5. Ischemic cardiomyopathy: No signs of heart failure. Continue ACEI and beta blocker.  Dispo: f/u 6 months.   Kate Sable, M.D., F.A.C.C.

## 2016-03-04 NOTE — Patient Instructions (Signed)
Your physician wants you to follow-up in: 6 months You will receive a reminder letter in the mail two months in advance. If you don't receive a letter, please call our office to schedule the follow-up appointment.   Your physician recommends that you continue on your current medications as directed. Please refer to the Current Medication list given to you today.    Your physician recommends that you return for lab work in: FASTING lipids    Thank you for choosing [facility] !

## 2016-03-07 ENCOUNTER — Encounter: Payer: Self-pay | Admitting: Nurse Practitioner

## 2016-03-07 ENCOUNTER — Ambulatory Visit (INDEPENDENT_AMBULATORY_CARE_PROVIDER_SITE_OTHER): Payer: Medicare Other | Admitting: Nurse Practitioner

## 2016-03-07 VITALS — BP 147/71 | HR 67 | Temp 97.3°F | Ht 65.0 in | Wt 137.6 lb

## 2016-03-07 DIAGNOSIS — D649 Anemia, unspecified: Secondary | ICD-10-CM | POA: Diagnosis not present

## 2016-03-07 DIAGNOSIS — I255 Ischemic cardiomyopathy: Secondary | ICD-10-CM

## 2016-03-07 DIAGNOSIS — R634 Abnormal weight loss: Secondary | ICD-10-CM

## 2016-03-07 DIAGNOSIS — R945 Abnormal results of liver function studies: Secondary | ICD-10-CM

## 2016-03-07 DIAGNOSIS — R1311 Dysphagia, oral phase: Secondary | ICD-10-CM | POA: Diagnosis not present

## 2016-03-07 DIAGNOSIS — R7989 Other specified abnormal findings of blood chemistry: Secondary | ICD-10-CM | POA: Diagnosis not present

## 2016-03-07 NOTE — Patient Instructions (Signed)
1. Have your labs drawn when you're able to. 2. Continue taking Protonix. 3. Return for follow-up in 3 months.

## 2016-03-07 NOTE — Progress Notes (Signed)
Referring Provider: Rosita Fire, MD Primary Care Physician:  Rosita Fire, MD Primary GI:  Dr. Gala Romney  Chief Complaint  Patient presents with  . Follow-up    HPI:   Ronald Mcdowell is a 70 y.o. male who presents for follow-up on weight loss and anemia. Last seen in our office 01/08/2016 with mild stable anemia with last hemoglobin 12.8. Also noted significant weight loss of 40 pounds over the previous several months and loose stools. He was referred for endoscopy and colonoscopy. Colonoscopy completed 01/25/2016 which found normal ileocolonoscopy with anal canal hemorrhoids. Endoscopy completed same day found reflux esophagitis, cervical web status post passage of scope, hiatal hernia, status post esophageal biopsy. Biopsy found chronic esophagitis. Recommended Protonix 40 mg daily. Stool for occult blood completed 01/28/2016 which was negative, last hemoglobin 11.4 on 01/13/2016. Ferritin was elevated 11/30/15 but hemachromatosis DNA was negative on 01/08/16.  Today he states he's doing well. GERD well controlled on Protonix. Denies further weight loss (objectively has gained weight since last visit.) Swallowing is improved. Denies persistent abdominal pain, dysphagia, odynophagia. Denies dyspnea, dizziness, lightheadedness, syncope, near syncope. Denies any other upper or lower GI symptoms.  Past Medical History  Diagnosis Date  . Hypertension   . Elevated cholesterol   . Coronary artery disease     a. h/o MI in 2000 w/ prior LCX stenting;  b. 7.2014 Abnl Cardiolite, EF 41%, mod-large inferolat scar;  c. 07/2013 Cath/PCI: LM nl, LAD 10-20, D1 40-50p, LCX 95-99 @ distal stent margin (2.5x20 Promus Premier DES), RCA dom, 40p, 22m, 70d, EF 35-40%.  . Tobacco user   . Ischemic cardiomyopathy     a. 07/2013 EF 35-40% by LV gram.  . Spondylosis of cervical joint     C3-4, C6-7  . Reflux esophagitis   . Hemorrhoids     Past Surgical History  Procedure Laterality Date  . Coronary stent  placement  2000  . Cardiac catheterization    . Transurethral resection of prostate  01/09/2012    Procedure: TRANSURETHRAL RESECTION OF THE PROSTATE (TURP);  Surgeon: Marissa Nestle, MD;  Location: [facility];  Service: Urology;  Laterality: N/A;  . Coronary angioplasty with stent placement  08/23/2013    mid circumflex  DES    by Dr Martinique  . Left heart catheterization with coronary angiogram N/A 08/23/2013    Procedure: LEFT HEART CATHETERIZATION WITH CORONARY ANGIOGRAM;  Surgeon: Peter M Martinique, MD;  Location: [facility] CATH LAB;  Service: Cardiovascular;  Laterality: N/A;  . Colonoscopy    . Colonoscopy N/A 01/25/2016    RMR: normal ileocolonoscopy ( anal canal hemorrhoids)  . Esophagogastroduodenoscopy N/A 01/25/2016    RMR: Reflux esophagitits. Cervical web with passage of the scope. Status post esophageal biosy. Hiatal hernia.     Current Outpatient Prescriptions  Medication Sig Dispense Refill  . amLODipine (NORVASC) 10 MG tablet Take 10 mg by mouth daily.      [name] aspirin 81 MG EC tablet Take 2 tablets (162 mg total) by mouth daily. (Patient taking differently: Take 81 mg by mouth daily. ) 30 tablet 1  . atorvastatin (LIPITOR) 40 MG tablet Take 40 mg by mouth at bedtime. Reported on 11/30/2015    . bisoprolol (ZEBETA) 10 MG tablet Take 0.5 tablets (5 mg total) by mouth daily.    [name] lisinopril (PRINIVIL,ZESTRIL) 10 MG tablet Take 10 mg by mouth daily.     [name] omeprazole (PRILOSEC) 20 MG capsule Take 20 mg by mouth daily.  No current facility-administered medications for this visit.    Allergies as of 03/07/2016  . (No Known Allergies)    Family History  Problem Relation Age of Onset  . Anesthesia problems Neg Hx   . Hypotension Neg Hx   . Malignant hyperthermia Neg Hx   . Pseudochol deficiency Neg Hx   . Colon cancer Neg Hx   . Liver disease Neg Hx     Social History   Social History  . Marital Status: Married    Spouse Name: N/A  . Number of Children: N/A  . Years of  Education: N/A   Social History Main Topics  . Smoking status: Current Every Day Smoker -- 1.00 packs/day for 40 years    Types: Cigarettes    Start date: 12/09/1965  . Smokeless tobacco: Never Used  . Alcohol Use: No  . Drug Use: No  . Sexual Activity: Yes    Birth Control/ Protection: None   Other Topics Concern  . None   Social History Narrative   Pt gets regular exercise.    Review of Systems: General: Negative for anorexia, weight loss, fever, chills, fatigue, weakness. ENT: Negative for hoarseness, difficulty swallowing. CV: Negative for chest pain, angina, palpitations, peripheral edema.  Respiratory: Negative for dyspnea at rest, cough, sputum, wheezing.  GI: See history of present illness. Endo: Negative for unusual weight change.  Heme: Negative for bruising or bleeding.   Physical Exam: BP 147/71 mmHg  Pulse 67  Temp(Src) 97.3 F (36.3 C)  Ht 5\' 5"  (1.651 m)  Wt 137 lb 9.6 oz (62.415 kg)  BMI 22.90 kg/m2 General:   Alert and oriented. Pleasant and cooperative. Well-nourished and well-developed.  Head:  Normocephalic and atraumatic. Eyes:  Without icterus, sclera clear and conjunctiva pink.  Cardiovascular:  S1, S2 present without murmurs appreciated. Extremities without clubbing or edema. Respiratory:  Clear to auscultation bilaterally. No wheezes, rales, or rhonchi. No distress.  Gastrointestinal:  +BS, soft, non-tender and non-distended. No guarding or rebound. Rectal:  Deferred  Musculoskalatal:  Symmetrical without gross deformities. Neurologic:  Alert and oriented x4;  grossly normal neurologically. Psych:  Alert and cooperative. Normal mood and affect. Heme/Lymph/Immune: No excessive bruising noted.    03/12/2016 8:50 AM   Disclaimer: This note was dictated with voice recognition software. Similar sounding words can inadvertently be transcribed and may not be corrected upon review.

## 2016-03-12 DIAGNOSIS — D649 Anemia, unspecified: Secondary | ICD-10-CM | POA: Diagnosis not present

## 2016-03-12 DIAGNOSIS — R7989 Other specified abnormal findings of blood chemistry: Secondary | ICD-10-CM | POA: Diagnosis not present

## 2016-03-12 DIAGNOSIS — E785 Hyperlipidemia, unspecified: Secondary | ICD-10-CM | POA: Diagnosis not present

## 2016-03-12 DIAGNOSIS — R634 Abnormal weight loss: Secondary | ICD-10-CM | POA: Diagnosis not present

## 2016-03-12 DIAGNOSIS — R1311 Dysphagia, oral phase: Secondary | ICD-10-CM | POA: Diagnosis not present

## 2016-03-12 DIAGNOSIS — R799 Abnormal finding of blood chemistry, unspecified: Secondary | ICD-10-CM | POA: Diagnosis not present

## 2016-03-12 LAB — CBC WITH DIFFERENTIAL/PLATELET
BASOS PCT: 0 %
Basophils Absolute: 0 cells/uL (ref 0–200)
EOS PCT: 3 %
Eosinophils Absolute: 303 cells/uL (ref 15–500)
HCT: 40.7 % (ref 38.5–50.0)
HEMOGLOBIN: 13.5 g/dL (ref 13.2–17.1)
LYMPHS ABS: 3434 {cells}/uL (ref 850–3900)
Lymphocytes Relative: 34 %
MCH: 28.3 pg (ref 27.0–33.0)
MCHC: 33.2 g/dL (ref 32.0–36.0)
MCV: 85.3 fL (ref 80.0–100.0)
MPV: 10.4 fL (ref 7.5–12.5)
Monocytes Absolute: 808 cells/uL (ref 200–950)
Monocytes Relative: 8 %
NEUTROS ABS: 5555 {cells}/uL (ref 1500–7800)
Neutrophils Relative %: 55 %
Platelets: 223 10*3/uL (ref 140–400)
RBC: 4.77 MIL/uL (ref 4.20–5.80)
RDW: 16.3 % — ABNORMAL HIGH (ref 11.0–15.0)
WBC: 10.1 10*3/uL (ref 3.8–10.8)

## 2016-03-12 NOTE — Assessment & Plan Note (Signed)
Elevated LFTs at his last office visit with workup for causes of liver disease. N/A was positive and ferritin was elevated but hemachromatosis genetics were negative. Possible acute phase reactant. His LFTs have been trending down. Today I will order a CMP to follow-up, return for follow-up in 3 months.

## 2016-03-12 NOTE — Assessment & Plan Note (Signed)
After endoscopy with dilation and improvement in dysphagia symptoms, the patient's weight has increased since his last office visit. Continue to monitor for further weight loss. Colonoscopy and endoscopy up-to-date and reassuring.

## 2016-03-12 NOTE — Assessment & Plan Note (Signed)
Patient with persistent mild anemia which has been relatively stable and 11.5-12.5 range over the past several months. Colonoscopy and endoscopy unrevealing. Possibly multifactorial in nature. I'll recheck a CBC today and have him return for follow-up. If he continues with persistent anemia may benefit from capsule endoscopy versus referral to hematology for chronic management of anemia.

## 2016-03-12 NOTE — Assessment & Plan Note (Signed)
Dysphagia improved status post endoscopy with dilation. Continue to monitor.

## 2016-03-12 NOTE — Progress Notes (Signed)
cc'ed to pcp °

## 2016-03-13 LAB — LIPID PANEL
CHOL/HDL RATIO: 3.8 ratio (ref <=5.0)
Cholesterol: 177 mg/dL (ref 125–200)
HDL: 46 mg/dL (ref >=40)
LDL CALC: 116 mg/dL (ref <130)
Triglycerides: 73 mg/dL (ref <150)
VLDL: 15 mg/dL (ref <30)

## 2016-03-13 LAB — COMPREHENSIVE METABOLIC PANEL
ALBUMIN: 3.3 g/dL — AB (ref 3.6–5.1)
ALT: 25 U/L (ref 9–46)
AST: 31 U/L (ref 10–35)
Alkaline Phosphatase: 82 U/L (ref 40–115)
BILIRUBIN TOTAL: 0.7 mg/dL (ref 0.2–1.2)
BUN: 13 mg/dL (ref 7–25)
CO2: 28 mmol/L (ref 20–31)
CREATININE: 0.79 mg/dL (ref 0.70–1.25)
Calcium: 8.5 mg/dL — ABNORMAL LOW (ref 8.6–10.3)
Chloride: 105 mmol/L (ref 98–110)
Glucose, Bld: 90 mg/dL (ref 65–99)
Potassium: 3.2 mmol/L — ABNORMAL LOW (ref 3.5–5.3)
SODIUM: 142 mmol/L (ref 135–146)
TOTAL PROTEIN: 6.8 g/dL (ref 6.1–8.1)

## 2016-03-21 DIAGNOSIS — N3281 Overactive bladder: Secondary | ICD-10-CM | POA: Diagnosis not present

## 2016-03-21 DIAGNOSIS — Z125 Encounter for screening for malignant neoplasm of prostate: Secondary | ICD-10-CM | POA: Diagnosis not present

## 2016-03-21 DIAGNOSIS — Z0000 Encounter for general adult medical examination without abnormal findings: Secondary | ICD-10-CM | POA: Diagnosis not present

## 2016-03-21 DIAGNOSIS — N5201 Erectile dysfunction due to arterial insufficiency: Secondary | ICD-10-CM | POA: Diagnosis not present

## 2016-05-16 DIAGNOSIS — I10 Essential (primary) hypertension: Secondary | ICD-10-CM | POA: Diagnosis not present

## 2016-05-16 DIAGNOSIS — F17290 Nicotine dependence, other tobacco product, uncomplicated: Secondary | ICD-10-CM | POA: Diagnosis not present

## 2016-05-16 DIAGNOSIS — E784 Other hyperlipidemia: Secondary | ICD-10-CM | POA: Diagnosis not present

## 2016-05-16 DIAGNOSIS — I2510 Atherosclerotic heart disease of native coronary artery without angina pectoris: Secondary | ICD-10-CM | POA: Diagnosis not present

## 2016-06-11 ENCOUNTER — Ambulatory Visit (INDEPENDENT_AMBULATORY_CARE_PROVIDER_SITE_OTHER): Payer: Medicare Other | Admitting: Nurse Practitioner

## 2016-06-11 ENCOUNTER — Encounter: Payer: Self-pay | Admitting: Nurse Practitioner

## 2016-06-11 VITALS — BP 132/74 | HR 57 | Temp 98.0°F | Ht 65.0 in | Wt 151.2 lb

## 2016-06-11 DIAGNOSIS — R634 Abnormal weight loss: Secondary | ICD-10-CM | POA: Diagnosis not present

## 2016-06-11 DIAGNOSIS — R945 Abnormal results of liver function studies: Secondary | ICD-10-CM

## 2016-06-11 DIAGNOSIS — D649 Anemia, unspecified: Secondary | ICD-10-CM

## 2016-06-11 DIAGNOSIS — R1311 Dysphagia, oral phase: Secondary | ICD-10-CM

## 2016-06-11 DIAGNOSIS — R7989 Other specified abnormal findings of blood chemistry: Secondary | ICD-10-CM | POA: Diagnosis not present

## 2016-06-11 DIAGNOSIS — I255 Ischemic cardiomyopathy: Secondary | ICD-10-CM

## 2016-06-11 NOTE — Progress Notes (Signed)
Referring Provider: Rosita Fire, MD Primary Care Physician:  Rosita Fire, MD Primary GI:  Dr. Gala Romney  Chief Complaint  Patient presents with  . Follow-up    Doing better and gaining weight    HPI:   Ronald Mcdowell is a 70 y.o. male who presents for follow-up. He was last seen in our office 03/07/2016 for dysphagia, anemia, loss of weight, abnormal LFTs. At that time he was doing quite well status post endoscopy with dilation. He had begun gaining weight again. GERD well-controlled on Protonix. Dysphagia resolved. At that time we rechecked his LFTs and CBC to monitor for anemia status as well as LFT status as they've had been trending down. Labs were completed on 03/12/2016 which showed normalized AST/ALT, persistently normal bilirubin and alkaline phosphatase. CBC showed an improvement in hemoglobin now normal at 13.5 (previously 11.4 and 12.8).  Today he states "all is good." No dysphagia, GERD breakthrough, weight loss. Objectively his weight 3 months ago was 137 pounds and today is 151 pounds (14lb gain in 3 months). Appetite is good. Denies abdominal pain, N/V, hematochezia, melena, fever, chills. Denies chest pain, dyspnea, dizziness, lightheadedness, syncope, near syncope. Denies any other upper or lower GI symptoms.  Past Medical History  Diagnosis Date  . Hypertension   . Elevated cholesterol   . Coronary artery disease     a. h/o MI in 2000 w/ prior LCX stenting;  b. 7.2014 Abnl Cardiolite, EF 41%, mod-large inferolat scar;  c. 07/2013 Cath/PCI: LM nl, LAD 10-20, D1 40-50p, LCX 95-99 @ distal stent margin (2.5x20 Promus Premier DES), RCA dom, 40p, 53m, 70d, EF 35-40%.  . Tobacco user   . Ischemic cardiomyopathy     a. 07/2013 EF 35-40% by LV gram.  . Spondylosis of cervical joint     C3-4, C6-7  . Reflux esophagitis   . Hemorrhoids     Past Surgical History  Procedure Laterality Date  . Coronary stent placement  2000  . Cardiac catheterization    . Transurethral  resection of prostate  01/09/2012    Procedure: TRANSURETHRAL RESECTION OF THE PROSTATE (TURP);  Surgeon: Marissa Nestle, MD;  Location: [facility];  Service: Urology;  Laterality: N/A;  . Coronary angioplasty with stent placement  08/23/2013    mid circumflex  DES    by Dr Martinique  . Left heart catheterization with coronary angiogram N/A 08/23/2013    Procedure: LEFT HEART CATHETERIZATION WITH CORONARY ANGIOGRAM;  Surgeon: Peter M Martinique, MD;  Location: [facility] CATH LAB;  Service: Cardiovascular;  Laterality: N/A;  . Colonoscopy    . Colonoscopy N/A 01/25/2016    RMR: normal ileocolonoscopy ( anal canal hemorrhoids)  . Esophagogastroduodenoscopy N/A 01/25/2016    RMR: Reflux esophagitits. Cervical web with passage of the scope. Status post esophageal biosy. Hiatal hernia.     Current Outpatient Prescriptions  Medication Sig Dispense Refill  . amLODipine (NORVASC) 10 MG tablet Take 10 mg by mouth daily.      [name] aspirin 81 MG EC tablet Take 2 tablets (162 mg total) by mouth daily. (Patient taking differently: Take 81 mg by mouth daily. ) 30 tablet 1  . atorvastatin (LIPITOR) 40 MG tablet Take 40 mg by mouth at bedtime. Reported on 11/30/2015    . bisoprolol (ZEBETA) 10 MG tablet Take 0.5 tablets (5 mg total) by mouth daily.    [name] lisinopril (PRINIVIL,ZESTRIL) 10 MG tablet Take 10 mg by mouth daily.     [name] omeprazole (PRILOSEC) 20 MG capsule  Take 20 mg by mouth daily.      No current facility-administered medications for this visit.    Allergies as of 06/11/2016  . (No Known Allergies)    Family History  Problem Relation Age of Onset  . Anesthesia problems Neg Hx   . Hypotension Neg Hx   . Malignant hyperthermia Neg Hx   . Pseudochol deficiency Neg Hx   . Colon cancer Neg Hx   . Liver disease Neg Hx     Social History   Social History  . Marital Status: Married    Spouse Name: N/A  . Number of Children: N/A  . Years of Education: N/A   Social History Main Topics  . Smoking status: Current  Every Day Smoker -- 1.00 packs/day for 40 years    Types: Cigarettes    Start date: 12/09/1965  . Smokeless tobacco: Never Used     Comment: 3/4 pack of cigarettes daily  . Alcohol Use: No  . Drug Use: No  . Sexual Activity: Yes    Birth Control/ Protection: None   Other Topics Concern  . None   Social History Narrative   Pt gets regular exercise.    Review of Systems: General: Negative for anorexia, weight loss, fever, chills, fatigue, weakness. ENT: Negative for hoarseness, difficulty swallowing. CV: Negative for chest pain, angina, palpitations, peripheral edema.  Respiratory: Negative for dyspnea at rest, cough, sputum, wheezing.  GI: See history of present illness. Endo: Negative for unusual weight change.  Heme: Negative for bruising or bleeding.   Physical Exam: BP 132/74 mmHg  Pulse 57  Temp(Src) 98 F (36.7 C) (Oral)  Ht 5\' 5"  (1.651 m)  Wt 151 lb 3.2 oz (68.584 kg)  BMI 25.16 kg/m2 General:   Alert and oriented. Pleasant and cooperative. Well-nourished and well-developed.  Eyes:  Without icterus, sclera clear and conjunctiva pink.  Ears:  Normal auditory acuity. Cardiovascular:  S1, S2 present without murmurs appreciated. Extremities without clubbing or edema. Respiratory:  Clear to auscultation bilaterally. No wheezes, rales, or rhonchi. No distress.  Gastrointestinal:  +BS, rounded but soft, non-tender and non-distended. No HSM noted. No guarding or rebound. No masses appreciated.  Rectal:  Deferred  Neurologic:  Alert and oriented x4;  grossly normal neurologically. Psych:  Alert and cooperative. Normal mood and affect. Heme/Lymph/Immune: No excessive bruising noted.    06/11/2016 2:30 PM   Disclaimer: This note was dictated with voice recognition software. Similar sounding words can inadvertently be transcribed and may not be corrected upon review.

## 2016-06-11 NOTE — Assessment & Plan Note (Signed)
Resolved on lab studies at last office visit. I'll recheck a CMP one more time to ensure sustained normalization of LFTs. Return for follow-up as needed or sooner based on lab results.

## 2016-06-11 NOTE — Patient Instructions (Signed)
1. Have your labs drawn when you're able to. 2. Return for follow-up as needed. 3. We will notify you of your lab results and may need to bring it back sooner based on those.

## 2016-06-11 NOTE — Assessment & Plan Note (Signed)
Weight loss is resolved, has put on 14 pounds objectively. States he feels much better and can tell he is put on some weight. Continue to monitor weight status, return for follow-up as needed.

## 2016-06-11 NOTE — Assessment & Plan Note (Signed)
Symptoms resolved, continue PPI. Continue to monitor, return for follow-up as needed.

## 2016-06-11 NOTE — Assessment & Plan Note (Signed)
Resolved on CBC per last lab draw 3 months ago. I will recheck his CBC one more time to verify sustained normal hemoglobin. If his hemoglobin is dropping and can consider capsule endoscopy versus hematology referral for anemia. Return for follow-up as needed, or sooner based on lab results.

## 2016-06-11 NOTE — Progress Notes (Signed)
CC'ED TO PCP 

## 2016-09-17 DIAGNOSIS — F17290 Nicotine dependence, other tobacco product, uncomplicated: Secondary | ICD-10-CM | POA: Diagnosis not present

## 2016-09-17 DIAGNOSIS — R945 Abnormal results of liver function studies: Secondary | ICD-10-CM | POA: Diagnosis not present

## 2016-09-17 DIAGNOSIS — I2510 Atherosclerotic heart disease of native coronary artery without angina pectoris: Secondary | ICD-10-CM | POA: Diagnosis not present

## 2016-09-17 DIAGNOSIS — F17200 Nicotine dependence, unspecified, uncomplicated: Secondary | ICD-10-CM | POA: Diagnosis not present

## 2016-09-17 DIAGNOSIS — I10 Essential (primary) hypertension: Secondary | ICD-10-CM | POA: Diagnosis not present

## 2016-09-17 DIAGNOSIS — E784 Other hyperlipidemia: Secondary | ICD-10-CM | POA: Diagnosis not present

## 2016-09-17 DIAGNOSIS — R1310 Dysphagia, unspecified: Secondary | ICD-10-CM | POA: Diagnosis not present

## 2016-09-17 DIAGNOSIS — Z23 Encounter for immunization: Secondary | ICD-10-CM | POA: Diagnosis not present

## 2016-09-17 DIAGNOSIS — R109 Unspecified abdominal pain: Secondary | ICD-10-CM | POA: Diagnosis not present

## 2016-09-17 DIAGNOSIS — Z0000 Encounter for general adult medical examination without abnormal findings: Secondary | ICD-10-CM | POA: Diagnosis not present

## 2016-09-17 DIAGNOSIS — I259 Chronic ischemic heart disease, unspecified: Secondary | ICD-10-CM | POA: Diagnosis not present

## 2016-12-11 ENCOUNTER — Ambulatory Visit: Payer: Medicare Other | Admitting: Cardiovascular Disease

## 2016-12-23 ENCOUNTER — Encounter: Payer: Self-pay | Admitting: Adult Health

## 2016-12-23 ENCOUNTER — Ambulatory Visit (INDEPENDENT_AMBULATORY_CARE_PROVIDER_SITE_OTHER): Payer: Medicare Other | Admitting: Adult Health

## 2016-12-23 VITALS — BP 160/86 | HR 61 | Ht 65.0 in | Wt 163.0 lb

## 2016-12-23 DIAGNOSIS — I255 Ischemic cardiomyopathy: Secondary | ICD-10-CM | POA: Diagnosis not present

## 2016-12-23 DIAGNOSIS — I2510 Atherosclerotic heart disease of native coronary artery without angina pectoris: Secondary | ICD-10-CM

## 2016-12-23 DIAGNOSIS — I10 Essential (primary) hypertension: Secondary | ICD-10-CM | POA: Diagnosis not present

## 2016-12-23 DIAGNOSIS — Z720 Tobacco use: Secondary | ICD-10-CM

## 2016-12-23 MED ORDER — ATORVASTATIN CALCIUM 40 MG PO TABS: 40.0000 mg | ORAL_TABLET | Freq: Every day | ORAL | 11 refills | Status: DC

## 2016-12-23 MED ORDER — AMLODIPINE BESYLATE 10 MG PO TABS: 10.0000 mg | ORAL_TABLET | Freq: Every day | ORAL | 11 refills | Status: DC

## 2016-12-23 MED ORDER — NITROGLYCERIN 0.4 MG SL SUBL: 0.4000 mg | SUBLINGUAL_TABLET | SUBLINGUAL | 3 refills | Status: DC | PRN

## 2016-12-23 MED ORDER — LISINOPRIL 10 MG PO TABS: 10.0000 mg | ORAL_TABLET | Freq: Every day | ORAL | 6 refills | Status: DC

## 2016-12-23 NOTE — Progress Notes (Signed)
Cardiology Office Note   Date:  12/23/2016   ID:  Wyman, Franta [birth year], MRN [id number]  PCP:  Rosita Fire, MD  Cardiologist: Woodroe Chen, NP   Chief Complaint  Patient presents with  . Coronary Artery Disease  . Cardiomyopathy    History of Present Illness: Ronald Mcdowell is a 71 y.o. male who presents for ongoing assessment and management of coronary artery disease with stent to the left circumflex in September 2014, he did have residual R CAD with 40% stenosis in the proximal vessel followed by a long 80% stenosis in the proximal to mid vessel. The distal vessel was diffusely diseased with 70%. If the patient had recalcitrant chest pain, refractory to medical therapy, intervention to RCA would require extensive stenting. He was placed on medical therapy.  Echocardiogram on 04/11/15 showed mild left ventricular dilatation with mildly reduced left ventricular systolic function, EF [id number] , inferior and lateral wall motion abnormalities, and insignificant valvular regurgitation with moderate left atrial dilatation.  He was last seen by Dr. Bronson Ing on 03/04/2016, and found to be stable from a cardiac standpoint. He was asymptomatic without any recurrent angina. He was found to have mildly elevated hypertension but no medicine changes were made at that time. We will continue to follow with need to adjust if necessary.  He has no complaints today. He apparently has had a bowel obstruction over the summer, causing him to lose a good bit of weight. This is been surgically repaired and he is gaining weight and feeling much better with more energy.  Past Medical History:  Diagnosis Date  . Coronary artery disease    a. h/o MI in 2000 w/ prior LCX stenting;  b. 7.2014 Abnl Cardiolite, EF 41%, mod-large inferolat scar;  c. 07/2013 Cath/PCI: LM nl, LAD 10-20, D1 40-50p, LCX 95-99 @ distal stent margin (2.5x20 Promus Premier DES), RCA dom, 40p, 37m, 70d, EF 35-40%.  .  Elevated cholesterol   . Hemorrhoids   . Hypertension   . Ischemic cardiomyopathy    a. 07/2013 EF 35-40% by LV gram.  . Reflux esophagitis   . Spondylosis of cervical joint    C3-4, C6-7  . Tobacco user     Past Surgical History:  Procedure Laterality Date  . CARDIAC CATHETERIZATION    . COLONOSCOPY    . COLONOSCOPY N/A 01/25/2016   RMR: normal ileocolonoscopy ( anal canal hemorrhoids)  . CORONARY ANGIOPLASTY WITH STENT PLACEMENT  08/23/2013   mid circumflex  DES    by Dr Martinique  . CORONARY STENT PLACEMENT  2000  . ESOPHAGOGASTRODUODENOSCOPY N/A 01/25/2016   RMR: Reflux esophagitits. Cervical web with passage of the scope. Status post esophageal biosy. Hiatal hernia.   [name] LEFT HEART CATHETERIZATION WITH CORONARY ANGIOGRAM N/A 08/23/2013   Procedure: LEFT HEART CATHETERIZATION WITH CORONARY ANGIOGRAM;  Surgeon: Peter M Martinique, MD;  Location: [facility] CATH LAB;  Service: Cardiovascular;  Laterality: N/A;  . TRANSURETHRAL RESECTION OF PROSTATE  01/09/2012   Procedure: TRANSURETHRAL RESECTION OF THE PROSTATE (TURP);  Surgeon: Marissa Nestle, MD;  Location: [facility];  Service: Urology;  Laterality: N/A;     Current Outpatient Prescriptions  Medication Sig Dispense Refill  . amLODipine (NORVASC) 10 MG tablet Take 1 tablet (10 mg total) by mouth daily. 30 tablet 11  . aspirin 81 MG EC tablet Take 2 tablets (162 mg total) by mouth daily. (Patient taking differently: Take 81 mg by mouth daily. ) 30 tablet 1  . atorvastatin (LIPITOR) 40  MG tablet Take 1 tablet (40 mg total) by mouth at bedtime. Reported on 11/30/2015 30 tablet 11  . bisoprolol (ZEBETA) 10 MG tablet Take 0.5 tablets (5 mg total) by mouth daily.    [name] lisinopril (PRINIVIL,ZESTRIL) 10 MG tablet Take 1 tablet (10 mg total) by mouth daily. 30 tablet 6  . omeprazole (PRILOSEC) 20 MG capsule Take 20 mg by mouth daily.     . nitroGLYCERIN (NITROSTAT) 0.4 MG SL tablet Place 1 tablet (0.4 mg total) under the tongue every 5 (five) minutes as needed  for chest pain. 25 tablet 3   No current facility-administered medications for this visit.     Allergies:   Patient has no known allergies.    Social History:  The patient  reports that he has been smoking Cigarettes.  He started smoking about 51 years ago. He has a 40.00 pack-year smoking history. He has never used smokeless tobacco. He reports that he does not drink alcohol or use drugs.   Family History:  The patient's family history includes Cancer in his father; Heart attack in his father.    ROS: All other systems are reviewed and negative. Unless otherwise mentioned in H&P    PHYSICAL EXAM: VS:  BP (!) 160/86   Pulse 61   Ht 5\' 5"  (1.651 m)   Wt 163 lb (73.9 kg)   SpO2 98%   BMI 27.12 kg/m  , BMI Body mass index is 27.12 kg/m. GEN: Well nourished, well developed, in no acute distress  HEENT: normal  Neck: no JVD, carotid bruits, or masses Cardiac: RRR;  no murmurs, rubs, or gallops,no edema  Respiratory:  clear to auscultation bilaterally, normal work of breathing GI: soft, nontender, nondistended, + BS MS: no deformity or atrophy  Skin: warm and dry, no rash Neuro:  Strength and sensation are intact Psych: euthymic mood, full affect  Recent Labs: 01/13/2016: Magnesium 1.2 03/12/2016: ALT 25; BUN 13; Creat 0.79; Hemoglobin 13.5; Platelets 223; Potassium 3.2; Sodium 142    Lipid Panel    Component Value Date/Time   CHOL 177 03/12/2016 0704   TRIG 73 03/12/2016 0704   HDL 46 03/12/2016 0704   CHOLHDL 3.8 03/12/2016 0704   VLDL 15 03/12/2016 0704   LDLCALC 116 03/12/2016 0704      Wt Readings from Last 3 Encounters:  12/23/16 163 lb (73.9 kg)  06/11/16 151 lb 3.2 oz (68.6 kg)  03/07/16 137 lb 9.6 oz (62.4 kg)     ASSESSMENT AND PLAN:  1. Hypertension: Elevated today. He states that he was in a hurry and also just finished smoking a cigarette. I talked with him about smoking cessation and he verbalizes understanding but is not yet ready to quit. He will  continue on lisinopril and bisoprolol. If blood pressure remains elevated may need to increase his dosing if he continues to smoke. For now we will monitor.  2. CAD: No recurrent angina. He remains on aspirin and statin along with beta blocker. I counseled him on smoking cessation and its contribution to worsening coronary artery disease. He verbalizes understanding.  3. Chronic systolic dysfunction: No evidence of decompensation. He is not on diuretics at this time.  4. Ongoing tobacco abuse: As stated above he has been counseled on smoking cessation. He is aware of the dangers of his continuation. He is not inclined to quit at this time. He states he has decreased from 3 packs a day to a half a pack a day.   Current medicines  are reviewed at length with the patient today.    Labs/ tests ordered today include:   Orders Placed This Encounter  Procedures  . EKG 12-Lead     Disposition:   FU with 6 months Signed, Jory Sims, NP  12/23/2016 3:58 PM    [facility] [address] [place], [place] [address] Phone: [phone]; Fax: [phone]

## 2016-12-23 NOTE — Progress Notes (Signed)
Name: Ronald Mcdowell    DOB: 17-Oct-1946  Age: 71 y.o.  MR#: [id number]       PCP:  Rosita Fire, MD      Insurance: Payor: MEDICARE / Plan: MEDICARE PART A AND B / Product Type: *No Product type* /   CC:   No chief complaint on file.   VS Vitals:   12/23/16 1423  BP: (!) 160/86  Pulse: 61  SpO2: 98%  Weight: 163 lb (73.9 kg)  Height: 5\' 5"  (1.651 m)    Weights Current Weight  12/23/16 163 lb (73.9 kg)  06/11/16 151 lb 3.2 oz (68.6 kg)  03/07/16 137 lb 9.6 oz (62.4 kg)    Blood Pressure  BP Readings from Last 3 Encounters:  12/23/16 (!) 160/86  06/11/16 132/74  03/07/16 (!) 147/71     Admit date:  (Not on file) Last encounter with RMR:  Visit date not found   Allergy Patient has no known allergies.  Current Outpatient Prescriptions  Medication Sig Dispense Refill  . amLODipine (NORVASC) 10 MG tablet Take 10 mg by mouth daily.      [name] aspirin 81 MG EC tablet Take 2 tablets (162 mg total) by mouth daily. (Patient taking differently: Take 81 mg by mouth daily. ) 30 tablet 1  . atorvastatin (LIPITOR) 40 MG tablet Take 40 mg by mouth at bedtime. Reported on 11/30/2015    . bisoprolol (ZEBETA) 10 MG tablet Take 0.5 tablets (5 mg total) by mouth daily.    [name] lisinopril (PRINIVIL,ZESTRIL) 10 MG tablet Take 10 mg by mouth daily.     [name] omeprazole (PRILOSEC) 20 MG capsule Take 20 mg by mouth daily.      No current facility-administered medications for this visit.     Discontinued Meds:   There are no discontinued medications.  Patient Active Problem List   Diagnosis Date Noted  . Rectal bleeding   . Weakness generalized 01/14/2016  . Weakness   . Elevated ferritin 01/08/2016  . Loss of weight 11/30/2015  . Abnormal LFTs 11/30/2015  . Anemia 11/30/2015  . Liver cyst 11/30/2015  . Abnormal CT of the abdomen 11/30/2015  . TIA (transient ischemic attack) 04/10/2015  . HTN (hypertension) 08/24/2013  . Unstable angina ([place]) 08/24/2013  . Tobacco user   . Coronary artery  disease   . Ischemic cardiomyopathy   . Bradycardia 06/07/2013  . Chest pain 06/06/2013  . DYSLIPIDEMIA 08/02/2010  . DYSPHAGIA ORAL PHASE 04/13/2010  . PROTEINURIA 04/13/2010  . PURE HYPERCHOLESTEROLEMIA 01/31/2010  . NONDEPENDENT TOBACCO USE DISORDER 01/31/2010  . Old myocardial infarction 01/31/2010  . CORONARY ATHEROSCLEROSIS NATIVE CORONARY ARTERY 01/31/2010  . POSTSURG PERCUT TRANSLUMINAL COR ANGPLSTY STS 01/31/2010    LABS    Component Value Date/Time   NA 142 03/12/2016 0707   NA 142 01/13/2016 1928   NA 142 11/30/2015 1025   K 3.2 (L) 03/12/2016 0707   K 3.1 (L) 01/13/2016 1928   K 3.8 11/30/2015 1025   CL 105 03/12/2016 0707   CL 106 01/13/2016 1928   CL 108 11/30/2015 1025   CO2 28 03/12/2016 0707   CO2 29 01/13/2016 1928   CO2 24 11/30/2015 1025   GLUCOSE 90 03/12/2016 0707   GLUCOSE 129 (H) 01/13/2016 1928   GLUCOSE 80 11/30/2015 1025   BUN 13 03/12/2016 0707   BUN 13 01/13/2016 1928   BUN 12 11/30/2015 1025   CREATININE 0.79 03/12/2016 0707   CREATININE 0.69 01/13/2016 1928   CREATININE 0.64 (L)  11/30/2015 1025   CREATININE 0.82 11/11/2015 2022   CREATININE 0.71 11/05/2015 1656   CALCIUM 8.5 (L) 03/12/2016 0707   CALCIUM 9.5 01/13/2016 1928   CALCIUM 9.6 11/30/2015 1025   GFRNONAA >60 01/13/2016 1928   GFRNONAA >60 11/11/2015 2022   GFRNONAA >60 11/05/2015 1656   GFRAA >60 01/13/2016 1928   GFRAA >60 11/11/2015 2022   GFRAA >60 11/05/2015 1656   CMP     Component Value Date/Time   NA 142 03/12/2016 0707   K 3.2 (L) 03/12/2016 0707   CL 105 03/12/2016 0707   CO2 28 03/12/2016 0707   GLUCOSE 90 03/12/2016 0707   BUN 13 03/12/2016 0707   CREATININE 0.79 03/12/2016 0707   CALCIUM 8.5 (L) 03/12/2016 0707   PROT 6.8 03/12/2016 0707   ALBUMIN 3.3 (L) 03/12/2016 0707   AST 31 03/12/2016 0707   ALT 25 03/12/2016 0707   ALKPHOS 82 03/12/2016 0707   BILITOT 0.7 03/12/2016 0707   GFRNONAA >60 01/13/2016 1928   GFRAA >60 01/13/2016 1928        Component Value Date/Time   WBC 10.1 03/12/2016 0707   WBC 10.0 01/13/2016 1928   WBC 8.6 11/30/2015 1025   HGB 13.5 03/12/2016 0707   HGB 11.4 (L) 01/13/2016 1928   HGB 12.8 (L) 11/30/2015 1025   HCT 40.7 03/12/2016 0707   HCT 34.3 (L) 01/13/2016 1928   HCT 37.9 (L) 11/30/2015 1025   MCV 85.3 03/12/2016 0707   MCV 83.7 01/13/2016 1928   MCV 82.9 11/30/2015 1025    Lipid Panel     Component Value Date/Time   CHOL 177 03/12/2016 0704   TRIG 73 03/12/2016 0704   HDL 46 03/12/2016 0704   CHOLHDL 3.8 03/12/2016 0704   VLDL 15 03/12/2016 0704   LDLCALC 116 03/12/2016 0704    ABG    Component Value Date/Time   TCO2 25 04/10/2015 0758     Lab Results  Component Value Date   TSH 0.674 04/10/2015   BNP (last 3 results) No results for input(s): BNP in the last 8760 hours.  ProBNP (last 3 results) No results for input(s): PROBNP in the last 8760 hours.  Cardiac Panel (last 3 results) No results for input(s): CKTOTAL, CKMB, TROPONINI, RELINDX in the last 72 hours.  Iron/TIBC/Ferritin/ %Sat    Component Value Date/Time   FERRITIN 720 (H) 11/30/2015 1025     EKG Orders placed or performed in visit on 12/23/16  . EKG 12-Lead     Prior Assessment and Plan Problem List as of 12/23/2016 Reviewed: 06/11/2016  2:18 PM by Ranae Pila, NP     Cardiovascular and Mediastinum   Old myocardial infarction   CORONARY ATHEROSCLEROSIS NATIVE CORONARY ARTERY   Last Assessment & Plan 02/25/2014 Office Visit Written 02/25/2014  1:30 PM by Lendon Colonel, NP    He is without chest pain or symptoms of fatigue. He asks about stopping Effient and I have explained to him that this will be revisited in 6 months once he has been on DAPT for one year. He verbalizes understanding.       HTN (hypertension)   Last Assessment & Plan 09/06/2013 Office Visit Written 09/06/2013  2:21 PM by Lendon Colonel, NP    He is very well controlled currently. Continue amoldipine and bisoprolol.  Consider adding ACE.       Unstable angina [facility])   Coronary artery disease   Last Assessment & Plan 09/06/2013 Office Visit Written 09/06/2013  2:20 PM by Lendon Colonel, NP    He is doing very well and is without cardiac complaint. He is feeling much more energetic and is now ready to return to work. He will remain on DAPT indefinitely. He is given samples of Effient from the office. Letter to return to work on October 20 , 2014. Will see him again in 6 months.      Ischemic cardiomyopathy   Last Assessment & Plan 02/25/2014 Office Visit Written 02/25/2014  1:28 PM by Lendon Colonel, NP    Will repeat his echo in 2-3 months. He is without complaint of DOE or palpitations. He is medically compliant and has become more active. Will continue him on current medications.       TIA (transient ischemic attack)     Digestive   DYSPHAGIA ORAL PHASE   Last Assessment & Plan 06/11/2016 Office Visit Written 06/11/2016  2:27 PM by Carlis Stable, NP    Symptoms resolved, continue PPI. Continue to monitor, return for follow-up as needed.      Liver cyst   Rectal bleeding     Other   PURE HYPERCHOLESTEROLEMIA   DYSLIPIDEMIA   NONDEPENDENT TOBACCO USE DISORDER   PROTEINURIA   POSTSURG PERCUT TRANSLUMINAL COR ANGPLSTY STS   Chest pain   Bradycardia   Tobacco user   Last Assessment & Plan 02/25/2014 Office Visit Written 02/25/2014  1:31 PM by Lendon Colonel, NP    Stopped smoking.      Loss of weight   Last Assessment & Plan 06/11/2016 Office Visit Written 06/11/2016  2:28 PM by Carlis Stable, NP    Weight loss is resolved, has put on 14 pounds objectively. States he feels much better and can tell he is put on some weight. Continue to monitor weight status, return for follow-up as needed.      Abnormal LFTs   Last Assessment & Plan 06/11/2016 Office Visit Written 06/11/2016  2:29 PM by Carlis Stable, NP    Resolved on lab studies at last office visit. I'll recheck a CMP one more time to ensure  sustained normalization of LFTs. Return for follow-up as needed or sooner based on lab results.      Anemia   Last Assessment & Plan 06/11/2016 Office Visit Written 06/11/2016  2:28 PM by Carlis Stable, NP    Resolved on CBC per last lab draw 3 months ago. I will recheck his CBC one more time to verify sustained normal hemoglobin. If his hemoglobin is dropping and can consider capsule endoscopy versus hematology referral for anemia. Return for follow-up as needed, or sooner based on lab results.      Abnormal CT of the abdomen   Last Assessment & Plan 11/30/2015 Office Visit Written 12/04/2015  2:12 PM by Carlis Stable, NP    Normal CT of the abdomen as noted in the history of present illness. Hepatic cysts felt to be benign although it was a noncontrast CT. We will further evaluate with an MRI of the abdomen given his loss of weight, anemia, and abnormal LFTs. Her for follow-up in 6-8 weeks.      Elevated ferritin   Last Assessment & Plan 01/08/2016 Office Visit Edited 01/19/2016  2:35 PM by Carlis Stable, NP    Patient with transaminitis and positive ANA titer with markedly increased ferritin at 720. This point we will order hemachromatosis genetics to evaluate as a possible etiology for his elevated ferritin and transaminitis.  Return for follow-up in 2 months.      Weakness generalized   Weakness       Imaging: No results found.

## 2016-12-23 NOTE — Patient Instructions (Signed)
Your physician wants you to follow-up in: 6 Months.  You will receive a reminder letter in the mail two months in advance. If you don't receive a letter, please call our office to schedule the follow-up appointment.  Your physician recommends that you continue on your current medications as directed. Please refer to the Current Medication list given to you today.  If you need a refill on your cardiac medications before your next appointment, please call your pharmacy.  Thank you for choosing [facility]!   

## 2017-01-16 DIAGNOSIS — I10 Essential (primary) hypertension: Secondary | ICD-10-CM | POA: Diagnosis not present

## 2017-01-16 DIAGNOSIS — K219 Gastro-esophageal reflux disease without esophagitis: Secondary | ICD-10-CM | POA: Diagnosis not present

## 2017-01-16 DIAGNOSIS — I2510 Atherosclerotic heart disease of native coronary artery without angina pectoris: Secondary | ICD-10-CM | POA: Diagnosis not present

## 2017-03-10 DIAGNOSIS — E7800 Pure hypercholesterolemia, unspecified: Secondary | ICD-10-CM | POA: Diagnosis not present

## 2017-03-10 DIAGNOSIS — F17200 Nicotine dependence, unspecified, uncomplicated: Secondary | ICD-10-CM | POA: Diagnosis not present

## 2017-03-10 DIAGNOSIS — M779 Enthesopathy, unspecified: Secondary | ICD-10-CM | POA: Diagnosis not present

## 2017-03-10 DIAGNOSIS — Z79899 Other long term (current) drug therapy: Secondary | ICD-10-CM | POA: Diagnosis not present

## 2017-03-10 DIAGNOSIS — M7752 Other enthesopathy of left foot: Secondary | ICD-10-CM | POA: Diagnosis not present

## 2017-03-10 DIAGNOSIS — B353 Tinea pedis: Secondary | ICD-10-CM | POA: Diagnosis not present

## 2017-03-10 DIAGNOSIS — Z7982 Long term (current) use of aspirin: Secondary | ICD-10-CM | POA: Diagnosis not present

## 2017-03-10 DIAGNOSIS — I10 Essential (primary) hypertension: Secondary | ICD-10-CM | POA: Diagnosis not present

## 2017-03-10 DIAGNOSIS — I252 Old myocardial infarction: Secondary | ICD-10-CM | POA: Diagnosis not present

## 2017-03-10 DIAGNOSIS — Z8249 Family history of ischemic heart disease and other diseases of the circulatory system: Secondary | ICD-10-CM | POA: Diagnosis not present

## 2017-03-17 DIAGNOSIS — L84 Corns and callosities: Secondary | ICD-10-CM | POA: Diagnosis not present

## 2017-03-17 DIAGNOSIS — Z87891 Personal history of nicotine dependence: Secondary | ICD-10-CM | POA: Diagnosis not present

## 2017-03-17 DIAGNOSIS — M79672 Pain in left foot: Secondary | ICD-10-CM | POA: Diagnosis not present

## 2017-03-31 ENCOUNTER — Ambulatory Visit (HOSPITAL_COMMUNITY)
Admission: RE | Admit: 2017-03-31 | Discharge: 2017-03-31 | Disposition: A | Discharge status: Home / Self Care | Payer: Medicare Other | Source: Ambulatory Visit | Attending: Internal Medicine | Admitting: Internal Medicine

## 2017-03-31 ENCOUNTER — Other Ambulatory Visit (HOSPITAL_COMMUNITY): Payer: Self-pay | Admitting: Internal Medicine

## 2017-03-31 DIAGNOSIS — M25511 Pain in right shoulder: Secondary | ICD-10-CM

## 2017-03-31 DIAGNOSIS — I10 Essential (primary) hypertension: Secondary | ICD-10-CM | POA: Diagnosis not present

## 2017-07-01 DIAGNOSIS — E784 Other hyperlipidemia: Secondary | ICD-10-CM | POA: Diagnosis not present

## 2017-07-01 DIAGNOSIS — I10 Essential (primary) hypertension: Secondary | ICD-10-CM | POA: Diagnosis not present

## 2017-07-01 DIAGNOSIS — I2510 Atherosclerotic heart disease of native coronary artery without angina pectoris: Secondary | ICD-10-CM | POA: Diagnosis not present

## 2017-07-08 ENCOUNTER — Ambulatory Visit: Payer: Medicare Other | Admitting: Cardiovascular Disease

## 2017-07-08 ENCOUNTER — Encounter: Payer: Self-pay | Admitting: Cardiovascular Disease

## 2017-08-01 ENCOUNTER — Ambulatory Visit (INDEPENDENT_AMBULATORY_CARE_PROVIDER_SITE_OTHER): Payer: Medicare Other | Admitting: Cardiovascular Disease

## 2017-08-01 ENCOUNTER — Encounter: Payer: Self-pay | Admitting: Cardiovascular Disease

## 2017-08-01 VITALS — BP 134/68 | HR 61 | Ht 65.0 in | Wt 158.8 lb

## 2017-08-01 DIAGNOSIS — E785 Hyperlipidemia, unspecified: Secondary | ICD-10-CM | POA: Diagnosis not present

## 2017-08-01 DIAGNOSIS — I209 Angina pectoris, unspecified: Secondary | ICD-10-CM

## 2017-08-01 DIAGNOSIS — I255 Ischemic cardiomyopathy: Secondary | ICD-10-CM | POA: Diagnosis not present

## 2017-08-01 DIAGNOSIS — I10 Essential (primary) hypertension: Secondary | ICD-10-CM

## 2017-08-01 DIAGNOSIS — I25118 Atherosclerotic heart disease of native coronary artery with other forms of angina pectoris: Secondary | ICD-10-CM

## 2017-08-01 DIAGNOSIS — Z720 Tobacco use: Secondary | ICD-10-CM | POA: Diagnosis not present

## 2017-08-01 DIAGNOSIS — I2510 Atherosclerotic heart disease of native coronary artery without angina pectoris: Secondary | ICD-10-CM

## 2017-08-01 DIAGNOSIS — I519 Heart disease, unspecified: Secondary | ICD-10-CM | POA: Diagnosis not present

## 2017-08-01 NOTE — Patient Instructions (Signed)

## 2017-08-01 NOTE — Progress Notes (Signed)
SUBJECTIVE: The patient presents for routine cardiovascular followup. He has a history of coronary artery disease with stenting of the left circumflex coronary artery in September 2014. At that time he was noted to have significant RCA disease, with a 40% stenosis in the proximal vessel followed by a long 80% stenosis in the proximal to mid vessel. The distal vessel was diffusely diseased to 70%. It was noted at that time that if he were to have recalcitrant chest pain refractory to medical therapy, the RCA would require extensive stenting. He also has hypertension, hyperlipidemia, tobacco abuse, and an ischemic cardiomyopathy.  Echocardiogram on 04/11/15 showed mild left ventricular dilatation with mildly reduced left ventricular systolic function, EF 76-16% , inferior and lateral wall motion abnormalities, and insignificant valvular regurgitation with moderate left atrial dilatation.  The patient denies any symptoms of chest pain, palpitations, shortness of breath, lightheadedness, dizziness, leg swelling, orthopnea, PND, and syncope.  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   He continues to smoke a pack of cigarettes daily.    Soc Hx: Lived in [place] for over 60 yrs, moved to [facility] in 2009.  Review of Systems: As per "subjective", otherwise negative.  No Known Allergies  Current Outpatient Prescriptions  Medication Sig Dispense Refill  . amLODipine (NORVASC) 10 MG tablet Take 1 tablet (10 mg total) by mouth daily. 30 tablet 11  . aspirin 81 MG EC tablet Take 2 tablets (162 mg total) by mouth daily. (Patient taking differently: Take 81 mg by mouth daily. ) 30 tablet 1  . atorvastatin (LIPITOR) 40 MG tablet Take 1 tablet (40 mg total) by mouth at bedtime. Reported on 11/30/2015 30 tablet 11  . bisoprolol (ZEBETA) 10 MG tablet Take 0.5 tablets (5 mg total) by mouth daily.    [name] lisinopril (PRINIVIL,ZESTRIL) 20 MG tablet Take 20 mg by mouth daily.   3  . nitroGLYCERIN (NITROSTAT) 0.4 MG SL tablet Place 1 tablet (0.4 mg total) under the tongue every 5 (five) minutes as needed for chest pain. 25 tablet 3  . omeprazole (PRILOSEC) 20 MG capsule Take 20 mg by mouth daily.      No current facility-administered medications for this visit.     Past Medical History:  Diagnosis Date  . Coronary artery disease    a. h/o MI in 2000 w/ prior LCX stenting;  b. 7.2014 Abnl Cardiolite, EF 41%, mod-large inferolat scar;  c. 07/2013 Cath/PCI: LM nl, LAD 10-20, D1 40-50p, LCX 95-99 @ distal stent margin (2.5x20 Promus Premier DES), RCA dom, 40p, 13m, 70d, EF 35-40%.  . Elevated cholesterol   . Hemorrhoids   . Hypertension   . Ischemic cardiomyopathy    a. 07/2013 EF 35-40% by LV gram.  . Reflux esophagitis   . Spondylosis of cervical joint    C3-4, C6-7  . Tobacco user     Past Surgical History:  Procedure Laterality Date  . CARDIAC CATHETERIZATION    . COLONOSCOPY    . COLONOSCOPY N/A 01/25/2016   RMR: normal ileocolonoscopy ( anal canal hemorrhoids)  . CORONARY ANGIOPLASTY WITH STENT PLACEMENT  08/23/2013   mid circumflex  DES    by Dr Martinique  . CORONARY STENT PLACEMENT  2000  . ESOPHAGOGASTRODUODENOSCOPY N/A 01/25/2016   RMR: Reflux esophagitits. Cervical web with passage of the scope. Status post esophageal biosy. Hiatal hernia.   [name] LEFT HEART CATHETERIZATION WITH CORONARY ANGIOGRAM N/A 08/23/2013  Procedure: LEFT HEART CATHETERIZATION WITH CORONARY ANGIOGRAM;  Surgeon: Peter M Martinique, MD;  Location: [facility] CATH LAB;  Service: Cardiovascular;  Laterality: N/A;  . TRANSURETHRAL RESECTION OF PROSTATE  01/09/2012   Procedure: TRANSURETHRAL RESECTION OF THE PROSTATE (TURP);  Surgeon: Marissa Nestle, MD;  Location: [facility];  Service: Urology;  Laterality: N/A;    Social History   Social History  . Marital status: Married    Spouse name: N/A  . Number of children: N/A  . Years of education: N/A   Occupational History  . Not on file.    Social History Main Topics  . Smoking status: Current Every Day Smoker    Packs/day: 1.00    Years: 40.00    Types: Cigarettes    Start date: 12/09/1965  . Smokeless tobacco: Never Used     Comment: 3/4 pack of cigarettes daily  . Alcohol use No  . Drug use: No  . Sexual activity: Yes    Birth control/ protection: None   Other Topics Concern  . Not on file   Social History Narrative   Pt gets regular exercise.     Vitals:   08/01/17 1511  BP: 134/68  Pulse: 61  SpO2: 98%  Weight: 158 lb 12.8 oz (72 kg)  Height: 5\' 5"  (1.651 m)    Wt Readings from Last 3 Encounters:  08/01/17 158 lb 12.8 oz (72 kg)  12/23/16 163 lb (73.9 kg)  06/11/16 151 lb 3.2 oz (68.6 kg)     PHYSICAL EXAM General: NAD HEENT: Normal. Neck: No JVD, no thyromegaly. Lungs: Clear to auscultation bilaterally with normal respiratory effort. CV: Nondisplaced PMI.  Regular rate and rhythm, normal S1/S2, no S3/S4, no murmur. No pretibial or periankle edema.  No carotid bruit.   Abdomen: Soft, nontender, no distention.  Neurologic: Alert and oriented.  Psych: Normal affect. Skin: Normal. Musculoskeletal: No gross deformities.    ECG: Most recent ECG reviewed.   Labs: Lab Results  Component Value Date/Time   K 3.2 (L) 03/12/2016 07:07 AM   BUN 13 03/12/2016 07:07 AM   CREATININE 0.79 03/12/2016 07:07 AM   ALT 25 03/12/2016 07:07 AM   TSH 0.674 04/10/2015 08:00 PM   HGB 13.5 03/12/2016 07:07 AM     Lipids: Lab Results  Component Value Date/Time   LDLCALC 116 03/12/2016 07:04 AM   CHOL 177 03/12/2016 07:04 AM   TRIG 73 03/12/2016 07:04 AM   HDL 46 03/12/2016 07:04 AM       ASSESSMENT AND PLAN:  1. CAD: Stable ischemic heart disease. Continue current therapy with ASA, Lipitor, and bisoprolol.  2. Essential HTN: Controlled. No changes.  3. Hyperlipidemia: Will obtain a copy of lipids. Continue statin.  4. Tobacco abuse history: Again says he wants to quit. Smokes 1  ppd.  5. Ischemic cardiomyopathy: No signs of heart failure. Continue ACEI and beta blocker.     Disposition: Follow up 1 yr   Kate Sable, M.D., F.A.C.C.

## 2017-08-02 DIAGNOSIS — F17200 Nicotine dependence, unspecified, uncomplicated: Secondary | ICD-10-CM | POA: Diagnosis not present

## 2017-08-02 DIAGNOSIS — I10 Essential (primary) hypertension: Secondary | ICD-10-CM | POA: Diagnosis not present

## 2017-08-02 DIAGNOSIS — Z8249 Family history of ischemic heart disease and other diseases of the circulatory system: Secondary | ICD-10-CM | POA: Diagnosis not present

## 2017-08-02 DIAGNOSIS — I252 Old myocardial infarction: Secondary | ICD-10-CM | POA: Diagnosis not present

## 2017-08-02 DIAGNOSIS — Z79899 Other long term (current) drug therapy: Secondary | ICD-10-CM | POA: Diagnosis not present

## 2017-08-02 DIAGNOSIS — Z7982 Long term (current) use of aspirin: Secondary | ICD-10-CM | POA: Diagnosis not present

## 2017-08-02 DIAGNOSIS — E7800 Pure hypercholesterolemia, unspecified: Secondary | ICD-10-CM | POA: Diagnosis not present

## 2017-08-02 DIAGNOSIS — R42 Dizziness and giddiness: Secondary | ICD-10-CM | POA: Diagnosis not present

## 2017-08-05 ENCOUNTER — Encounter: Payer: Self-pay | Admitting: *Deleted

## 2017-10-20 ENCOUNTER — Ambulatory Visit: Payer: Medicare Other | Admitting: Family Medicine

## 2017-11-04 ENCOUNTER — Ambulatory Visit (INDEPENDENT_AMBULATORY_CARE_PROVIDER_SITE_OTHER): Payer: Medicare Other | Admitting: Family Medicine

## 2017-11-04 ENCOUNTER — Encounter: Payer: Self-pay | Admitting: Family Medicine

## 2017-11-04 ENCOUNTER — Other Ambulatory Visit: Payer: Self-pay

## 2017-11-04 VITALS — BP 128/78 | HR 60 | Temp 98.6°F | Resp 16 | Ht 65.0 in | Wt 157.1 lb

## 2017-11-04 DIAGNOSIS — R7989 Other specified abnormal findings of blood chemistry: Secondary | ICD-10-CM

## 2017-11-04 DIAGNOSIS — R1311 Dysphagia, oral phase: Secondary | ICD-10-CM | POA: Diagnosis not present

## 2017-11-04 DIAGNOSIS — R351 Nocturia: Secondary | ICD-10-CM | POA: Diagnosis not present

## 2017-11-04 DIAGNOSIS — Z23 Encounter for immunization: Secondary | ICD-10-CM

## 2017-11-04 DIAGNOSIS — I10 Essential (primary) hypertension: Secondary | ICD-10-CM

## 2017-11-04 DIAGNOSIS — I2510 Atherosclerotic heart disease of native coronary artery without angina pectoris: Secondary | ICD-10-CM | POA: Diagnosis not present

## 2017-11-04 DIAGNOSIS — E785 Hyperlipidemia, unspecified: Secondary | ICD-10-CM | POA: Diagnosis not present

## 2017-11-04 DIAGNOSIS — N401 Enlarged prostate with lower urinary tract symptoms: Secondary | ICD-10-CM | POA: Diagnosis not present

## 2017-11-04 DIAGNOSIS — R945 Abnormal results of liver function studies: Secondary | ICD-10-CM | POA: Diagnosis not present

## 2017-11-04 DIAGNOSIS — E538 Deficiency of other specified B group vitamins: Secondary | ICD-10-CM | POA: Diagnosis not present

## 2017-11-04 DIAGNOSIS — K130 Diseases of lips: Secondary | ICD-10-CM

## 2017-11-04 LAB — COMPLETE METABOLIC PANEL WITH GFR
AG RATIO: 1.4 (calc) (ref 1.0–2.5)
ALT: 26 U/L (ref 9–46)
AST: 28 U/L (ref 10–35)
Albumin: 4.4 g/dL (ref 3.6–5.1)
Alkaline phosphatase (APISO): 44 U/L (ref 40–115)
BUN: 14 mg/dL (ref 7–25)
CALCIUM: 8.8 mg/dL (ref 8.6–10.3)
CO2: 28 mmol/L (ref 20–32)
CREATININE: 1.14 mg/dL (ref 0.70–1.18)
Chloride: 107 mmol/L (ref 98–110)
GFR, EST NON AFRICAN AMERICAN: 65 mL/min/{1.73_m2} (ref >=60)
GFR, Est African American: 75 mL/min/{1.73_m2} (ref >=60)
GLOBULIN: 3.2 g/dL (ref 1.9–3.7)
Glucose, Bld: 88 mg/dL (ref 65–139)
POTASSIUM: 3.7 mmol/L (ref 3.5–5.3)
SODIUM: 143 mmol/L (ref 135–146)
Total Bilirubin: 0.8 mg/dL (ref 0.2–1.2)
Total Protein: 7.6 g/dL (ref 6.1–8.1)

## 2017-11-04 LAB — CBC
HCT: 40 % (ref 38.5–50.0)
Hemoglobin: 13.7 g/dL (ref 13.2–17.1)
MCH: 29.8 pg (ref 27.0–33.0)
MCHC: 34.3 g/dL (ref 32.0–36.0)
MCV: 87 fL (ref 80.0–100.0)
MPV: 12 fL (ref 7.5–12.5)
PLATELETS: 145 10*3/uL (ref 140–400)
RBC: 4.6 10*6/uL (ref 4.20–5.80)
RDW: 13.5 % (ref 11.0–15.0)
WBC: 9.6 10*3/uL (ref 3.8–10.8)

## 2017-11-04 LAB — LIPID PANEL
Cholesterol: 131 mg/dL (ref <200)
HDL: 36 mg/dL — ABNORMAL LOW (ref >40)
LDL Cholesterol (Calc): 79 mg/dL (calc)
NON-HDL CHOLESTEROL (CALC): 95 mg/dL (ref <130)
Total CHOL/HDL Ratio: 3.6 (calc) (ref <5.0)
Triglycerides: 80 mg/dL (ref <150)

## 2017-11-04 LAB — VITAMIN B12: VITAMIN B 12: 319 pg/mL (ref 200–1100)

## 2017-11-04 LAB — PSA: PSA: 0.9 ng/mL (ref <=4.0)

## 2017-11-04 NOTE — Patient Instructions (Signed)
Continue on current medications  I have ordered some blood tests.  Please get these done today. I will notify you if there is anything abnormal, or unexpected  I have ordered a referral back to gastroenterology for your difficulty swallowing  Need records from Dr. Legrand Rams  See me in a month or 2 for a physical examination

## 2017-11-04 NOTE — Addendum Note (Signed)
Addended by: Ova Freshwater on: 11/04/2017 02:42 PM   Modules accepted: Orders

## 2017-11-04 NOTE — Progress Notes (Signed)
Chief Complaint  Patient presents with  . Hypertension    est   This is a new patient to establish with the office.  Previous primary care outside of the [facility] system.  He does see Dr. Bobby Rumpf cardiology. He has well-controlled hypertension.  He is on amlodipine, lisinopril, and a beta-blocker. He is known coronary artery disease status post stents.  He is compliant with Lipitor. He takes a baby aspirin once a day. He states he gets plenty of exercises.  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 He is not overweight.  He is on a regular diet.  He does not drink alcohol. I have discussed the multiple health risks associated with cigarette smoking including, but not limited to, cardiovascular disease, lung disease and cancer.  I have strongly recommended that smoking be stopped.  I have reviewed the various methods of quitting including cold [place], classes, nicotine replacements and prescription medications.  I have offered assistance in this difficult process.  The patient is not interested in assistance at this time. Patient agrees that when he comes back we will further discuss smoking cessation.  I explained to him that it is the most important thing he could do for his health He states he had a reaction to his flu shot a couple years back with a sore arm.  I explained to him the importance of getting a flu shot.  He agrees to get one today.  I also recommend a pneumonia shot.  He does not want to get 2 shots today, but will get the pneumonia shot next time if his flu shot does not hurt.  Unknown tetanus status.  He is up-to-date otherwise with testing and colonoscopy. His only active complaint is difficulty swallowing pills.  Sometimes food.  He has a history of dysphasia in the past.  I told him that he needs to go back to his gastroenterologist and may need another endoscopy.  His weight is stable.  He is compliant with Prilosec 20 mg a day.  He does not  complain of heartburn or abdominal pain.   Patient Active Problem List   Diagnosis Date Noted  . HLD (hyperlipidemia) 11/04/2017  . Liver cyst 11/30/2015  . TIA (transient ischemic attack) 04/10/2015  . HTN (hypertension) 08/24/2013  . Tobacco user   . Coronary artery disease   . Ischemic cardiomyopathy   . DYSPHAGIA ORAL PHASE 04/13/2010  . Old myocardial infarction 01/31/2010  . CORONARY ATHEROSCLEROSIS NATIVE CORONARY ARTERY 01/31/2010    Outpatient Encounter Medications as of 11/04/2017  Medication Sig  . amLODipine (NORVASC) 10 MG tablet Take 1 tablet (10 mg total) by mouth daily.  [name] aspirin 81 MG EC tablet Take 2 tablets (162 mg total) by mouth daily. (Patient taking differently: Take 81 mg by mouth daily. )  . atorvastatin (LIPITOR) 40 MG tablet Take 1 tablet (40 mg total) by mouth at bedtime. Reported on 11/30/2015  . bisoprolol (ZEBETA) 10 MG tablet Take 0.5 tablets (5 mg total) by mouth daily.  [name] lisinopril (PRINIVIL,ZESTRIL) 20 MG tablet Take 20 mg by mouth daily.  . nitroGLYCERIN (NITROSTAT) 0.4 MG SL tablet Place 1 tablet (0.4 mg total) under the tongue every 5 (five) minutes as needed for chest pain.  [name] omeprazole (PRILOSEC) 20 MG capsule Take 20 mg by mouth daily.    No facility-administered encounter medications on file as of 11/04/2017.     Past Medical History:  Diagnosis Date  .  Anemia   . Coronary artery disease    a. h/o MI in 2000 w/ prior LCX stenting;  b. 7.2014 Abnl Cardiolite, EF 41%, mod-large inferolat scar;  c. 07/2013 Cath/PCI: LM nl, LAD 10-20, D1 40-50p, LCX 95-99 @ distal stent margin (2.5x20 Promus Premier DES), RCA dom, 40p, 71m, 70d, EF 35-40%.  . Elevated cholesterol   . GERD (gastroesophageal reflux disease)   . Hemorrhoids   . Hypertension   . Ischemic cardiomyopathy    a. 07/2013 EF 35-40% by LV gram.  . Myocardial infarction ([place])   . Reflux esophagitis   . Spondylosis of cervical joint    C3-4, C6-7  . Tobacco user     Past  Surgical History:  Procedure Laterality Date  . CARDIAC CATHETERIZATION    . COLONOSCOPY    . COLONOSCOPY N/A 01/25/2016   RMR: normal ileocolonoscopy ( anal canal hemorrhoids)  . CORONARY ANGIOPLASTY WITH STENT PLACEMENT  08/23/2013   mid circumflex  DES    by Dr Martinique  . CORONARY STENT PLACEMENT  2000  . ESOPHAGOGASTRODUODENOSCOPY N/A 01/25/2016   RMR: Reflux esophagitits. Cervical web with passage of the scope. Status post esophageal biosy. Hiatal hernia.   [name] LEFT HEART CATHETERIZATION WITH CORONARY ANGIOGRAM N/A 08/23/2013   Procedure: LEFT HEART CATHETERIZATION WITH CORONARY ANGIOGRAM;  Surgeon: Peter M Martinique, MD;  Location: [facility] CATH LAB;  Service: Cardiovascular;  Laterality: N/A;  . TRANSURETHRAL RESECTION OF PROSTATE  01/09/2012   Procedure: TRANSURETHRAL RESECTION OF THE PROSTATE (TURP);  Surgeon: Marissa Nestle, MD;  Location: [facility] ORS;  Service: Urology;  Laterality: N/A;    Social History   Socioeconomic History  . Marital status: Married    Spouse name: [name]  . Number of children: Not on file  . Years of education: 11th grade  . Highest education level: 11th grade  Social Needs  . Financial resource strain: Not hard at all  . Food insecurity - worry: Never true  . Food insecurity - inability: Never true  . Transportation needs - medical: No  . Transportation needs - non-medical: No  Occupational History  . Occupation: Librarian, academic  Tobacco Use  . Smoking status: Current Every Day Smoker    Packs/day: 1.00    Years: 40.00    Pack years: 40.00    Types: Cigarettes    Start date: 12/09/1965  . Smokeless tobacco: Never Used  . Tobacco comment: 3/4 pack of cigarettes daily  Substance and Sexual Activity  . Alcohol use: No    Alcohol/week: 0.0 oz  . Drug use: No  . Sexual activity: Yes    Birth control/protection: None  Other Topics Concern  . Not on file  Social History Narrative   Pt gets regular exercise.    Family History  Problem Relation Age of Onset  .  Early death Mother        childbirth  . Heart attack Father   . Cancer Father   . Heart disease Brother        CABG  . Heart disease Brother        slight heart attack  . Anesthesia problems Neg Hx   . Hypotension Neg Hx   . Malignant hyperthermia Neg Hx   . Pseudochol deficiency Neg Hx   . Colon cancer Neg Hx   . Liver disease Neg Hx     Review of Systems  Constitutional: Negative for chills, fever and weight loss.  HENT: Negative for congestion and hearing loss.  Dentures.  Chronic cracking at the corners of mouth  Eyes: Negative for blurred vision and pain.  Respiratory: Negative for cough and shortness of breath.   Cardiovascular: Negative for chest pain and leg swelling.       "Never" needs nitro  Gastrointestinal: Negative for abdominal pain, constipation, diarrhea and heartburn.       Difficulty swallowing pills, some food  Genitourinary: Negative for dysuria and frequency.  Musculoskeletal: Negative for falls, joint pain and myalgias.  Neurological: Negative for dizziness, seizures and headaches.  Psychiatric/Behavioral: Negative for depression. The patient is not nervous/anxious and does not have insomnia.     BP 128/78 (BP Location: Left Arm, Patient Position: Sitting, Cuff Size: Normal)   Pulse 60   Temp 98.6 F (37 C) (Temporal)   Resp 16   Ht 5\' 5"  (1.651 m)   Wt 157 lb 1.9 oz (71.3 kg)   SpO2 99%   BMI 26.15 kg/m   Physical Exam  Constitutional: He is oriented to person, place, and time. He appears well-developed and well-nourished.  HENT:  Head: Normocephalic and atraumatic.  Right Ear: External ear normal.  Left Ear: External ear normal.  Mouth/Throat: Oropharynx is clear and moist.  Angular chelitis  Eyes: Conjunctivae are normal. Pupils are equal, round, and reactive to light.  Neck: Normal range of motion. Neck supple. No thyromegaly present.  Cardiovascular: Normal rate, regular rhythm and normal heart sounds.  Pulmonary/Chest: Effort  normal and breath sounds normal. No respiratory distress.  Abdominal: Soft. Bowel sounds are normal.  Musculoskeletal: Normal range of motion. He exhibits no edema.  Lymphadenopathy:    He has no cervical adenopathy.  Neurological: He is alert and oriented to person, place, and time.  Gait normal  Skin: Skin is warm and dry.  Psychiatric: He has a normal mood and affect. His behavior is normal. Thought content normal.  Nursing note and vitals reviewed. ASSESSMENT/PLAN:   1. Atherosclerosis of native coronary artery of native heart without angina pectoris No chest pain or cardiac symptoms.  Sees cardiology once a year  2. Essential hypertension Well-controlled - CBC - COMPLETE METABOLIC PANEL WITH GFR - Urinalysis, Routine w reflex microscopic  3. Abnormal LFTs By history  4. Hyperlipidemia, unspecified hyperlipidemia type On cholesterol medication.  Overdue for lipid panel - Lipid panel  5. Angular cheilitis Chronic - Vitamin B12  6. DYSPHAGIA ORAL PHASE History of esophageal stricture. - Ambulatory referral to Gastroenterology  7. Nocturia associated with benign prostatic hyperplasia Discussed screening for prostate cancer.  Patient desires screening.  Discussed pros and cons. - PSA  8. Vitamin B 12 deficiency - Vitamin B12   Patient Instructions  Continue on current medications  I have ordered some blood tests.  Please get these done today. I will notify you if there is anything abnormal, or unexpected  I have ordered a referral back to gastroenterology for your difficulty swallowing  Need records from Dr. Legrand Rams  See me in a month or 2 for a physical examination   Raylene Everts, MD

## 2017-11-05 ENCOUNTER — Encounter: Payer: Self-pay | Admitting: Family Medicine

## 2017-11-05 LAB — URINALYSIS, ROUTINE W REFLEX MICROSCOPIC
BACTERIA UA: NONE SEEN /HPF
Bilirubin Urine: NEGATIVE
Glucose, UA: NEGATIVE
HYALINE CAST: NONE SEEN /LPF
Ketones, ur: NEGATIVE
Nitrite: NEGATIVE
PH: 5.5 (ref 5.0–8.0)
RBC / HPF: NONE SEEN /HPF (ref 0–2)
SPECIFIC GRAVITY, URINE: 1.008 (ref 1.001–1.03)
SQUAMOUS EPITHELIAL / LPF: NONE SEEN /HPF (ref <=5)

## 2017-11-06 ENCOUNTER — Telehealth (INDEPENDENT_AMBULATORY_CARE_PROVIDER_SITE_OTHER): Payer: Self-pay | Admitting: Internal Medicine

## 2017-11-06 NOTE — Telephone Encounter (Signed)
Hope, this is RGA

## 2017-11-06 NOTE — Telephone Encounter (Signed)
We have received an internal referral on this patient for Dysaphia Oral Phase.  The patient was seen by Hinds in 2017.  Please review the chart and advise.

## 2017-11-10 ENCOUNTER — Encounter: Payer: Self-pay | Admitting: Internal Medicine

## 2017-12-05 ENCOUNTER — Ambulatory Visit (INDEPENDENT_AMBULATORY_CARE_PROVIDER_SITE_OTHER): Payer: Medicare Other | Admitting: Family Medicine

## 2017-12-05 ENCOUNTER — Encounter: Payer: Self-pay | Admitting: Family Medicine

## 2017-12-05 ENCOUNTER — Other Ambulatory Visit: Payer: Self-pay

## 2017-12-05 VITALS — BP 128/76 | HR 60 | Temp 97.1°F | Resp 16 | Ht 65.0 in | Wt 157.1 lb

## 2017-12-05 DIAGNOSIS — Z0000 Encounter for general adult medical examination without abnormal findings: Secondary | ICD-10-CM | POA: Diagnosis not present

## 2017-12-05 NOTE — Patient Instructions (Signed)
Pneumonia shot today See Dr. Buford Dresser as scheduled Continue to eat well and stay active Consider quitting smoking.  Cut down if you can.  Let me know if you need help See me in 6 months Call sooner for problems

## 2017-12-05 NOTE — Progress Notes (Signed)
Chief Complaint  Patient presents with  . Annual Exam  Patient is here for an annual examination. He is up-to-date with his health maintenance with the exception of a tetanus shot. We also discussed shingles shot and I recommend he call his Medicare for authorization. He did have a flu shot and a Prevnar shot this season. He has heart disease and is under the care of cardiology.  No chest pain or shortness of breath.  He is fully active and still works full-time. He has a history of GERD and he tells me is having some difficulty swallowing.  He has an appointment with Dr. Buford Dresser in the near future. He continues to smoke cigarettes.  The importance of smoking cessation is again emphasized to him.  He states he does not feel able at this time.  He is pre-contemplative.  He will let me know if he changes his mind.  I did remind him that he has increased risk of worsening coronary artery disease as long as he smokes. I recommend an eye exam.  It has been more than 2 years.  He has one scheduled for February He does not require ongoing dental care, he does have well fitting dentures Discussed his recent blood test results.  His LDL is 79 on 40 mg a day of Lipitor.  He probably should be on 80 mg a day given his coronary arteries disease and cerebrovascular disease.  We will increase this.  Everything else was normal or as expected   Patient Active Problem List   Diagnosis Date Noted  . HLD (hyperlipidemia) 11/04/2017  . Liver cyst 11/30/2015  . TIA (transient ischemic attack) 04/10/2015  . HTN (hypertension) 08/24/2013  . Tobacco user   . Coronary artery disease   . Ischemic cardiomyopathy   . DYSPHAGIA ORAL PHASE 04/13/2010  . Old myocardial infarction 01/31/2010  . CORONARY ATHEROSCLEROSIS NATIVE CORONARY ARTERY 01/31/2010    Outpatient Encounter Medications as of 12/05/2017  Medication Sig  . amLODipine (NORVASC) 10 MG tablet Take 1 tablet (10 mg total) by mouth daily.  [name] aspirin 81  MG EC tablet Take 2 tablets (162 mg total) by mouth daily. (Patient taking differently: Take 81 mg by mouth daily. )  . atorvastatin (LIPITOR) 40 MG tablet Take 1 tablet (40 mg total) by mouth at bedtime. Reported on 11/30/2015  . bisoprolol (ZEBETA) 10 MG tablet Take 0.5 tablets (5 mg total) by mouth daily.  [name] lisinopril (PRINIVIL,ZESTRIL) 20 MG tablet Take 20 mg by mouth daily.  [name] omeprazole (PRILOSEC) 20 MG capsule Take 20 mg by mouth daily.   . nitroGLYCERIN (NITROSTAT) 0.4 MG SL tablet Place 1 tablet (0.4 mg total) under the tongue every 5 (five) minutes as needed for chest pain.   No facility-administered encounter medications on file as of 12/05/2017.     No Known Allergies  Review of Systems  Constitutional: Negative for activity change, appetite change and unexpected weight change.  HENT: Positive for trouble swallowing. Negative for congestion and dental problem.   Eyes: Positive for visual disturbance. Negative for photophobia and redness.  Respiratory: Negative for cough, choking and shortness of breath.   Cardiovascular: Negative for chest pain, palpitations and leg swelling.  Gastrointestinal: Negative for blood in stool, constipation, diarrhea and nausea.  Genitourinary: Negative for difficulty urinating and frequency.  Musculoskeletal: Negative for arthralgias and back pain.  Skin: Negative for color change and rash.  Neurological: Negative for dizziness and headaches.  Psychiatric/Behavioral: Negative for decreased concentration and  sleep disturbance. The patient is not nervous/anxious.     Physical Exam  BP 128/76 (BP Location: Left Arm, Patient Position: Sitting, Cuff Size: Normal)   Pulse 60   Temp (!) 97.1 F (36.2 C) (Temporal)   Resp 16   Ht 5\' 5"  (1.651 m)   Wt 157 lb 1.3 oz (71.3 kg)   SpO2 98%   BMI 26.14 kg/m   General Appearance:    Alert, cooperative, no distress, appears stated age  Head:    Normocephalic, without obvious abnormality, atraumatic    Eyes:    PERRL, conjunctiva/corneas clear, EOM's intact, fundi    benign, both eyes       Ears:    Normal TM's and external ear canals, both ears  Nose:   Nares normal, septum midline, mucosa normal, no drainage   or sinus tenderness  Throat:   Lips, mucosa, and tongue normal;  gums normal, dentures in place  Neck:   Supple, symmetrical, trachea midline, no adenopathy;       thyroid:  No enlargement/tenderness/nodules; no carotid   bruit or JVD  Back:     Symmetric, no curvature, ROM normal, no CVA tenderness  Lungs:     Clear to auscultation bilaterally, respirations unlabored  Chest wall:    No tenderness or deformity  Heart:    Regular rate and rhythm, S1 and S2 normal, soft systolic murmur, no rub   or gallop  Abdomen:     Soft, non-tender, bowel sounds active all four quadrants,    no masses, no organomegaly  Genitalia:    Normal male without lesion, discharge or tenderness  Extremities:   Extremities normal, atraumatic, no cyanosis or edema  Pulses:   2+ and symmetric all extremities  Skin:   Skin color normal turgor, very dry texture on lower legs, scaling, few scattered comedones on upper back face and neck.  Lymph nodes:   Cervical, supraclavicular, and axillary nodes normal  Neurologic:   Normal strength, sensation and reflexes      throughout     ASSESSMENT/PLAN:  1. Annual physical exam No unexpected findings Wellness is discussed.  Diet exercise.  DASH diet recommended.  Quit smoking.   Patient Instructions  Pneumonia shot today See Dr. Buford Dresser as scheduled Continue to eat well and stay active Consider quitting smoking.  Cut down if you can.  Let me know if you need help See me in 6 months Call sooner for problems   Raylene Everts, MD

## 2017-12-17 ENCOUNTER — Other Ambulatory Visit: Payer: Self-pay

## 2017-12-17 ENCOUNTER — Telehealth: Payer: Self-pay

## 2017-12-17 ENCOUNTER — Ambulatory Visit (INDEPENDENT_AMBULATORY_CARE_PROVIDER_SITE_OTHER): Payer: Medicare Other | Admitting: Nurse Practitioner

## 2017-12-17 ENCOUNTER — Encounter: Payer: Self-pay | Admitting: Nurse Practitioner

## 2017-12-17 DIAGNOSIS — K219 Gastro-esophageal reflux disease without esophagitis: Secondary | ICD-10-CM | POA: Insufficient documentation

## 2017-12-17 DIAGNOSIS — R1311 Dysphagia, oral phase: Secondary | ICD-10-CM | POA: Diagnosis not present

## 2017-12-17 DIAGNOSIS — K2100 Gastro-esophageal reflux disease with esophagitis, without bleeding: Secondary | ICD-10-CM

## 2017-12-17 DIAGNOSIS — R1310 Dysphagia, unspecified: Secondary | ICD-10-CM

## 2017-12-17 NOTE — Progress Notes (Signed)
Referring Provider: Raylene Everts, MD Primary Care Physician:  Raylene Everts, MD Primary GI:  Dr. Gala Romney  Chief Complaint  Patient presents with  . Dysphagia    f/u. food still gets stuck and has problem swalling meds    HPI:   Ronald Mcdowell is a 72 y.o. male who presents for dysphasia.  The patient was last seen in our office 06/11/2016 also for dysphasia.  Previously with elevated liver enzymes and trend downward with normalization of AST/ALT, persistently normal bilirubin and alkaline phosphatase.  Previous anemia was normal at 13.5.  At his last visit he stated he was doing well with no dysphagia, GERD breakthrough, weight loss.  He is gained 14 pounds in 3 months as noted at his prior office visit.  Appetite good.  No other GI symptoms.  Recommended repeat labs and follow-up as needed.  Most recent labs completed 11/04/2017 which showed normal CBC and normal CMP.  Colonoscopy up-to-date 01/25/2016 with normal exam other than anal canal hemorrhoids.  EGD completed the same day found reflux esophagitis, cervical web dilated with passage of the scope, esophageal biopsy, hiatal hernia.  Surgical pathology found the biopsies to be chronic esophagitis without eosinophilic infiltration.  Today he states he started having dysphagia about 2 months ago. Started with pill dysphagia requiring regurgitation. Has been crushing his pills. Also with solid food dysphagia now on a daily basis. Occasional regurgitation. Worst offending foods include chicken, steak, pork chop. Keeps fluids on hand, makes concerted effort to chew thoroughly. Denies GERD symptoms, hematochezia, melena, fever, chills, unintentional weight loss. Denies chest pain, dyspnea, dizziness, lightheadedness, syncope, near syncope. Denies any other upper or lower GI symptoms.  Past Medical History:  Diagnosis Date  . Anemia   . Coronary artery disease    a. h/o MI in 2000 w/ prior LCX stenting;  b. 7.2014 Abnl Cardiolite, EF  41%, mod-large inferolat scar;  c. 07/2013 Cath/PCI: LM nl, LAD 10-20, D1 40-50p, LCX 95-99 @ distal stent margin (2.5x20 Promus Premier DES), RCA dom, 40p, 45m, 70d, EF 35-40%.  . Elevated cholesterol   . GERD (gastroesophageal reflux disease)   . Hemorrhoids   . Hypertension   . Ischemic cardiomyopathy    a. 07/2013 EF 35-40% by LV gram.  . Myocardial infarction ([place])   . Reflux esophagitis   . Spondylosis of cervical joint    C3-4, C6-7  . Tobacco user     Past Surgical History:  Procedure Laterality Date  . CARDIAC CATHETERIZATION    . COLONOSCOPY    . COLONOSCOPY N/A 01/25/2016   RMR: normal ileocolonoscopy ( anal canal hemorrhoids)  . CORONARY ANGIOPLASTY WITH STENT PLACEMENT  08/23/2013   mid circumflex  DES    by Dr Martinique  . CORONARY STENT PLACEMENT  2000  . ESOPHAGOGASTRODUODENOSCOPY N/A 01/25/2016   RMR: Reflux esophagitits. Cervical web with passage of the scope. Status post esophageal biosy. Hiatal hernia.   [name] LEFT HEART CATHETERIZATION WITH CORONARY ANGIOGRAM N/A 08/23/2013   Procedure: LEFT HEART CATHETERIZATION WITH CORONARY ANGIOGRAM;  Surgeon: Peter M Martinique, MD;  Location: [facility] CATH LAB;  Service: Cardiovascular;  Laterality: N/A;  . TRANSURETHRAL RESECTION OF PROSTATE  01/09/2012   Procedure: TRANSURETHRAL RESECTION OF THE PROSTATE (TURP);  Surgeon: Marissa Nestle, MD;  Location: [facility] ORS;  Service: Urology;  Laterality: N/A;    Current Outpatient Medications  Medication Sig Dispense Refill  . amLODipine (NORVASC) 10 MG tablet Take 1 tablet (10 mg total) by mouth daily.  30 tablet 11  . aspirin 81 MG EC tablet Take 2 tablets (162 mg total) by mouth daily. (Patient taking differently: Take 81 mg by mouth daily. ) 30 tablet 1  . atorvastatin (LIPITOR) 40 MG tablet Take 1 tablet (40 mg total) by mouth at bedtime. Reported on 11/30/2015 30 tablet 11  . bisoprolol (ZEBETA) 10 MG tablet Take 0.5 tablets (5 mg total) by mouth daily.    [name] lisinopril (PRINIVIL,ZESTRIL) 20 MG  tablet Take 20 mg by mouth daily.  3  . nitroGLYCERIN (NITROSTAT) 0.4 MG SL tablet Place 1 tablet (0.4 mg total) under the tongue every 5 (five) minutes as needed for chest pain. 25 tablet 3  . omeprazole (PRILOSEC) 20 MG capsule Take 20 mg by mouth daily.      No current facility-administered medications for this visit.     Allergies as of 12/17/2017  . (No Known Allergies)    Family History  Problem Relation Age of Onset  . Early death Mother        childbirth  . Heart attack Father   . Cancer Father   . Heart disease Brother        CABG  . Heart disease Brother        slight heart attack  . Anesthesia problems Neg Hx   . Hypotension Neg Hx   . Malignant hyperthermia Neg Hx   . Pseudochol deficiency Neg Hx   . Colon cancer Neg Hx   . Liver disease Neg Hx   . Gastric cancer Neg Hx   . Esophageal cancer Neg Hx     Social History   Socioeconomic History  . Marital status: Married    Spouse name: [name]  . Number of children: None  . Years of education: 11th grade  . Highest education level: 11th grade  Social Needs  . Financial resource strain: Not hard at all  . Food insecurity - worry: Never true  . Food insecurity - inability: Never true  . Transportation needs - medical: No  . Transportation needs - non-medical: No  Occupational History  . Occupation: Librarian, academic  Tobacco Use  . Smoking status: Current Every Day Smoker    Packs/day: 1.00    Years: 40.00    Pack years: 40.00    Types: Cigarettes    Start date: 12/09/1965  . Smokeless tobacco: Never Used  . Tobacco comment: 3/4 pack of cigarettes daily  Substance and Sexual Activity  . Alcohol use: No    Alcohol/week: 0.0 oz  . Drug use: No  . Sexual activity: Yes    Birth control/protection: None  Other Topics Concern  . None  Social History Narrative   Pt gets regular exercise.    Review of Systems: General: Negative for anorexia, weight loss, fever, chills, fatigue, weakness. ENT: Negative for  hoarseness. Admits solid food and pill dysphagia. CV: Negative for chest pain, angina, palpitations, peripheral edema.  Respiratory: Negative for dyspnea at rest, cough, sputum, wheezing.  GI: See history of present illness. Endo: Negative for unusual weight change.  Heme: Negative for bruising or bleeding.   Physical Exam: BP (!) 148/77   Pulse 60   Temp 97.7 F (36.5 C) (Oral)   Ht 5\' 5"  (1.651 m)   Wt 156 lb 12.8 oz (71.1 kg)   BMI 26.09 kg/m  General:   Alert and oriented. Pleasant and cooperative. Well-nourished and well-developed.  Eyes:  Without icterus, sclera clear and conjunctiva pink.  Ears:  Normal auditory acuity.  Cardiovascular:  S1, S2 present without murmurs appreciated. Extremities without clubbing or edema. Respiratory:  Clear to auscultation bilaterally. No wheezes, rales, or rhonchi. No distress.  Gastrointestinal:  +BS, soft, non-tender and non-distended. No HSM noted. No guarding or rebound. No masses appreciated.  Rectal:  Deferred  Musculoskalatal:  Symmetrical without gross deformities. Neurologic:  Alert and oriented x4;  grossly normal neurologically. Psych:  Alert and cooperative. Normal mood and affect. Heme/Lymph/Immune: No excessive bruising noted.    12/17/2017 9:41 AM   Disclaimer: This note was dictated with voice recognition software. Similar sounding words can inadvertently be transcribed and may not be corrected upon review.

## 2017-12-17 NOTE — Assessment & Plan Note (Signed)
GERD symptoms currently well controlled on PPI.  Last EGD did show reflux esophagitis.  Recommend he continue his PPI at this point.  Return for follow-up in 3 months.

## 2017-12-17 NOTE — Assessment & Plan Note (Signed)
The patient describes recurrence of solid food and pill dysphagia as of about 2 months ago.  He is crushing his medications.  I advised him to call the pharmacy to ensure all of his medications can be crushed.  Most offending foods tend to be meats.  He makes a concerted effort to chew well and keeps fluid on hands.  I recommended he continue to do so.  History of cervical web dilation in 2017.  At this point we will proceed with an upper endoscopy with possible dilation for further evaluation and symptom management.  Return for follow-up in 3 months.  Proceed with EGD +/- dilation with Dr. Gala Romney in near future: the risks, benefits, and alternatives have been discussed with the patient in detail. The patient states understanding and desires to proceed.  The patient is not currently on any antidepressants, anxiolytics, chronic pain medications, or anticoagulants.  Conscious sedation should be adequate for his procedure as it was for his last.

## 2017-12-17 NOTE — Telephone Encounter (Signed)
I called [facility] and spoke to Argyle the pharmacist.  She said all of the pt's meds can be cut or crushed except the Omeprazole, which is a capsule with beads. She will change it to a tablet that can be crushed. Pt's wife is aware of this and will discuss with pharmacist which ones to cut or crush.

## 2017-12-17 NOTE — Patient Instructions (Signed)
1. We will schedule your procedure for you. 2. Continue to take your acid blocker medication. 3. I recommend calling her pharmacy to make sure all of your medicines can be cut in half and/or crush. 4. Return for follow-up in 3 months. 5. Call us if you have any questions or concerns.

## 2017-12-17 NOTE — Progress Notes (Signed)
cc'ed to pcp °

## 2017-12-17 NOTE — H&P (View-Only) (Signed)
Referring Provider: Raylene Everts, MD Primary Care Physician:  Raylene Everts, MD Primary GI:  Dr. Gala Romney  Chief Complaint  Patient presents with  . Dysphagia    f/u. food still gets stuck and has problem swalling meds    HPI:   Ronald Mcdowell is a 72 y.o. male who presents for dysphasia.  The patient was last seen in our office 06/11/2016 also for dysphasia.  Previously with elevated liver enzymes and trend downward with normalization of AST/ALT, persistently normal bilirubin and alkaline phosphatase.  Previous anemia was normal at 13.5.  At his last visit he stated he was doing well with no dysphagia, GERD breakthrough, weight loss.  He is gained 14 pounds in 3 months as noted at his prior office visit.  Appetite good.  No other GI symptoms.  Recommended repeat labs and follow-up as needed.  Most recent labs completed 11/04/2017 which showed normal CBC and normal CMP.  Colonoscopy up-to-date 01/25/2016 with normal exam other than anal canal hemorrhoids.  EGD completed the same day found reflux esophagitis, cervical web dilated with passage of the scope, esophageal biopsy, hiatal hernia.  Surgical pathology found the biopsies to be chronic esophagitis without eosinophilic infiltration.  Today he states he started having dysphagia about 2 months ago. Started with pill dysphagia requiring regurgitation. Has been crushing his pills. Also with solid food dysphagia now on a daily basis. Occasional regurgitation. Worst offending foods include chicken, steak, pork chop. Keeps fluids on hand, makes concerted effort to chew thoroughly. Denies GERD symptoms, hematochezia, melena, fever, chills, unintentional weight loss. Denies chest pain, dyspnea, dizziness, lightheadedness, syncope, near syncope. Denies any other upper or lower GI symptoms.  Past Medical History:  Diagnosis Date  . Anemia   . Coronary artery disease    a. h/o MI in 2000 w/ prior LCX stenting;  b. 7.2014 Abnl Cardiolite, EF  41%, mod-large inferolat scar;  c. 07/2013 Cath/PCI: LM nl, LAD 10-20, D1 40-50p, LCX 95-99 @ distal stent margin (2.5x20 Promus Premier DES), RCA dom, 40p, 78m, 70d, EF 35-40%.  . Elevated cholesterol   . GERD (gastroesophageal reflux disease)   . Hemorrhoids   . Hypertension   . Ischemic cardiomyopathy    a. 07/2013 EF 35-40% by LV gram.  . Myocardial infarction ([place])   . Reflux esophagitis   . Spondylosis of cervical joint    C3-4, C6-7  . Tobacco user     Past Surgical History:  Procedure Laterality Date  . CARDIAC CATHETERIZATION    . COLONOSCOPY    . COLONOSCOPY N/A 01/25/2016   RMR: normal ileocolonoscopy ( anal canal hemorrhoids)  . CORONARY ANGIOPLASTY WITH STENT PLACEMENT  08/23/2013   mid circumflex  DES    by Dr Martinique  . CORONARY STENT PLACEMENT  2000  . ESOPHAGOGASTRODUODENOSCOPY N/A 01/25/2016   RMR: Reflux esophagitits. Cervical web with passage of the scope. Status post esophageal biosy. Hiatal hernia.   [name] LEFT HEART CATHETERIZATION WITH CORONARY ANGIOGRAM N/A 08/23/2013   Procedure: LEFT HEART CATHETERIZATION WITH CORONARY ANGIOGRAM;  Surgeon: Peter M Martinique, MD;  Location: [facility] CATH LAB;  Service: Cardiovascular;  Laterality: N/A;  . TRANSURETHRAL RESECTION OF PROSTATE  01/09/2012   Procedure: TRANSURETHRAL RESECTION OF THE PROSTATE (TURP);  Surgeon: Marissa Nestle, MD;  Location: [facility]S;  Service: Urology;  Laterality: N/A;    Current Outpatient Medications  Medication Sig Dispense Refill  . amLODipine (NORVASC) 10 MG tablet Take 1 tablet (10 mg total) by mouth daily.  30 tablet 11  . aspirin 81 MG EC tablet Take 2 tablets (162 mg total) by mouth daily. (Patient taking differently: Take 81 mg by mouth daily. ) 30 tablet 1  . atorvastatin (LIPITOR) 40 MG tablet Take 1 tablet (40 mg total) by mouth at bedtime. Reported on 11/30/2015 30 tablet 11  . bisoprolol (ZEBETA) 10 MG tablet Take 0.5 tablets (5 mg total) by mouth daily.    [name] lisinopril (PRINIVIL,ZESTRIL) 20 MG  tablet Take 20 mg by mouth daily.  3  . nitroGLYCERIN (NITROSTAT) 0.4 MG SL tablet Place 1 tablet (0.4 mg total) under the tongue every 5 (five) minutes as needed for chest pain. 25 tablet 3  . omeprazole (PRILOSEC) 20 MG capsule Take 20 mg by mouth daily.      No current facility-administered medications for this visit.     Allergies as of 12/17/2017  . (No Known Allergies)    Family History  Problem Relation Age of Onset  . Early death Mother        childbirth  . Heart attack Father   . Cancer Father   . Heart disease Brother        CABG  . Heart disease Brother        slight heart attack  . Anesthesia problems Neg Hx   . Hypotension Neg Hx   . Malignant hyperthermia Neg Hx   . Pseudochol deficiency Neg Hx   . Colon cancer Neg Hx   . Liver disease Neg Hx   . Gastric cancer Neg Hx   . Esophageal cancer Neg Hx     Social History   Socioeconomic History  . Marital status: Married    Spouse name: [name]  . Number of children: None  . Years of education: 11th grade  . Highest education level: 11th grade  Social Needs  . Financial resource strain: Not hard at all  . Food insecurity - worry: Never true  . Food insecurity - inability: Never true  . Transportation needs - medical: No  . Transportation needs - non-medical: No  Occupational History  . Occupation: Librarian, academic  Tobacco Use  . Smoking status: Current Every Day Smoker    Packs/day: 1.00    Years: 40.00    Pack years: 40.00    Types: Cigarettes    Start date: 12/09/1965  . Smokeless tobacco: Never Used  . Tobacco comment: 3/4 pack of cigarettes daily  Substance and Sexual Activity  . Alcohol use: No    Alcohol/week: 0.0 oz  . Drug use: No  . Sexual activity: Yes    Birth control/protection: None  Other Topics Concern  . None  Social History Narrative   Pt gets regular exercise.    Review of Systems: General: Negative for anorexia, weight loss, fever, chills, fatigue, weakness. ENT: Negative for  hoarseness. Admits solid food and pill dysphagia. CV: Negative for chest pain, angina, palpitations, peripheral edema.  Respiratory: Negative for dyspnea at rest, cough, sputum, wheezing.  GI: See history of present illness. Endo: Negative for unusual weight change.  Heme: Negative for bruising or bleeding.   Physical Exam: BP (!) 148/77   Pulse 60   Temp 97.7 F (36.5 C) (Oral)   Ht 5\' 5"  (1.651 m)   Wt 156 lb 12.8 oz (71.1 kg)   BMI 26.09 kg/m  General:   Alert and oriented. Pleasant and cooperative. Well-nourished and well-developed.  Eyes:  Without icterus, sclera clear and conjunctiva pink.  Ears:  Normal auditory acuity.  Cardiovascular:  S1, S2 present without murmurs appreciated. Extremities without clubbing or edema. Respiratory:  Clear to auscultation bilaterally. No wheezes, rales, or rhonchi. No distress.  Gastrointestinal:  +BS, soft, non-tender and non-distended. No HSM noted. No guarding or rebound. No masses appreciated.  Rectal:  Deferred  Musculoskalatal:  Symmetrical without gross deformities. Neurologic:  Alert and oriented x4;  grossly normal neurologically. Psych:  Alert and cooperative. Normal mood and affect. Heme/Lymph/Immune: No excessive bruising noted.    12/17/2017 9:41 AM   Disclaimer: This note was dictated with voice recognition software. Similar sounding words can inadvertently be transcribed and may not be corrected upon review.

## 2017-12-18 NOTE — Telephone Encounter (Signed)
Great, thanks

## 2017-12-24 ENCOUNTER — Telehealth: Payer: Self-pay

## 2017-12-24 MED ORDER — ATORVASTATIN CALCIUM 40 MG PO TABS
40.0000 mg | ORAL_TABLET | Freq: Every day | ORAL | 11 refills | Status: DC
Start: 2017-12-24 — End: 2019-09-09

## 2017-12-24 MED ORDER — LISINOPRIL 20 MG PO TABS: 20.0000 mg | ORAL_TABLET | Freq: Every day | ORAL | 3 refills | Status: DC

## 2017-12-24 NOTE — Telephone Encounter (Signed)
Pt needs refill on Atorvastain 40 mg 1@bedtime  and Lisinopril 20 mg 1 daily.  Please call to [facility].  [facility] # [phone]

## 2017-12-24 NOTE — Telephone Encounter (Signed)
Done

## 2018-01-09 ENCOUNTER — Encounter (HOSPITAL_COMMUNITY): Payer: Self-pay | Admitting: *Deleted

## 2018-01-09 ENCOUNTER — Ambulatory Visit (HOSPITAL_COMMUNITY)
Admission: RE | Admit: 2018-01-09 | Discharge: 2018-01-09 | Disposition: A | Discharge status: Home / Self Care | Payer: Medicare Other | Source: Ambulatory Visit | Attending: Internal Medicine | Admitting: Internal Medicine

## 2018-01-09 ENCOUNTER — Other Ambulatory Visit: Payer: Self-pay

## 2018-01-09 ENCOUNTER — Encounter (HOSPITAL_COMMUNITY)
Admission: RE | Disposition: A | Discharge status: Home / Self Care | Payer: Self-pay | Source: Ambulatory Visit | Attending: Internal Medicine

## 2018-01-09 DIAGNOSIS — E7800 Pure hypercholesterolemia, unspecified: Secondary | ICD-10-CM | POA: Diagnosis not present

## 2018-01-09 DIAGNOSIS — I255 Ischemic cardiomyopathy: Secondary | ICD-10-CM | POA: Insufficient documentation

## 2018-01-09 DIAGNOSIS — I252 Old myocardial infarction: Secondary | ICD-10-CM | POA: Diagnosis not present

## 2018-01-09 DIAGNOSIS — F17210 Nicotine dependence, cigarettes, uncomplicated: Secondary | ICD-10-CM | POA: Diagnosis not present

## 2018-01-09 DIAGNOSIS — I2510 Atherosclerotic heart disease of native coronary artery without angina pectoris: Secondary | ICD-10-CM | POA: Diagnosis not present

## 2018-01-09 DIAGNOSIS — Z79899 Other long term (current) drug therapy: Secondary | ICD-10-CM | POA: Insufficient documentation

## 2018-01-09 DIAGNOSIS — K2100 Gastro-esophageal reflux disease with esophagitis, without bleeding: Secondary | ICD-10-CM

## 2018-01-09 DIAGNOSIS — D649 Anemia, unspecified: Secondary | ICD-10-CM | POA: Insufficient documentation

## 2018-01-09 DIAGNOSIS — Z955 Presence of coronary angioplasty implant and graft: Secondary | ICD-10-CM | POA: Diagnosis not present

## 2018-01-09 DIAGNOSIS — Q394 Esophageal web: Secondary | ICD-10-CM | POA: Diagnosis not present

## 2018-01-09 DIAGNOSIS — Z7982 Long term (current) use of aspirin: Secondary | ICD-10-CM | POA: Diagnosis not present

## 2018-01-09 DIAGNOSIS — K222 Esophageal obstruction: Secondary | ICD-10-CM

## 2018-01-09 DIAGNOSIS — K449 Diaphragmatic hernia without obstruction or gangrene: Secondary | ICD-10-CM | POA: Insufficient documentation

## 2018-01-09 DIAGNOSIS — I10 Essential (primary) hypertension: Secondary | ICD-10-CM | POA: Insufficient documentation

## 2018-01-09 DIAGNOSIS — K200 Eosinophilic esophagitis: Secondary | ICD-10-CM | POA: Diagnosis not present

## 2018-01-09 DIAGNOSIS — R1310 Dysphagia, unspecified: Secondary | ICD-10-CM | POA: Diagnosis not present

## 2018-01-09 DIAGNOSIS — K228 Other specified diseases of esophagus: Secondary | ICD-10-CM | POA: Diagnosis not present

## 2018-01-09 HISTORY — PX: MALONEY DILATION: SHX5535

## 2018-01-09 HISTORY — PX: BIOPSY: SHX5522

## 2018-01-09 HISTORY — PX: ESOPHAGOGASTRODUODENOSCOPY: SHX5428

## 2018-01-09 SURGERY — EGD (ESOPHAGOGASTRODUODENOSCOPY)
Anesthesia: Moderate Sedation

## 2018-01-09 MED ORDER — MEPERIDINE HCL 100 MG/ML IJ SOLN
INTRAMUSCULAR | Status: DC | PRN
  Administered 2018-01-09: 50 mg via INTRAVENOUS
  Administered 2018-01-09: 25 mg via INTRAVENOUS

## 2018-01-09 MED ORDER — SIMETHICONE 40 MG/0.6ML PO SUSP
ORAL | Status: DC | PRN
  Administered 2018-01-09: 2.5 mL

## 2018-01-09 MED ORDER — MIDAZOLAM HCL 5 MG/5ML IJ SOLN
INTRAMUSCULAR | Status: AC
  Filled 2018-01-09: qty 10

## 2018-01-09 MED ORDER — ONDANSETRON HCL 4 MG/2ML IJ SOLN
INTRAMUSCULAR | Status: AC
  Filled 2018-01-09: qty 2

## 2018-01-09 MED ORDER — MEPERIDINE HCL 100 MG/ML IJ SOLN
INTRAMUSCULAR | Status: AC
  Filled 2018-01-09: qty 2

## 2018-01-09 MED ORDER — LIDOCAINE VISCOUS 2 % MT SOLN
OROMUCOSAL | Status: DC | PRN
  Administered 2018-01-09: 1 via OROMUCOSAL

## 2018-01-09 MED ORDER — ONDANSETRON HCL 4 MG/2ML IJ SOLN
INTRAMUSCULAR | Status: DC | PRN
  Administered 2018-01-09: 4 mg via INTRAVENOUS

## 2018-01-09 MED ORDER — SODIUM CHLORIDE 0.9 % IV SOLN
INTRAVENOUS | Status: DC
  Administered 2018-01-09: 12:00:00 via INTRAVENOUS

## 2018-01-09 MED ORDER — MIDAZOLAM HCL 5 MG/5ML IJ SOLN
INTRAMUSCULAR | Status: DC | PRN
  Administered 2018-01-09: 1 mg via INTRAVENOUS
  Administered 2018-01-09 (×2): 2 mg via INTRAVENOUS

## 2018-01-09 MED ORDER — LIDOCAINE VISCOUS 2 % MT SOLN
OROMUCOSAL | Status: AC
  Filled 2018-01-09: qty 15

## 2018-01-09 NOTE — Interval H&P Note (Signed)
History and Physical Interval Note:  01/09/2018 12:30 PM  Ronald Mcdowell  has presented today for surgery, with the diagnosis of GERD, dysphagia  The various methods of treatment have been discussed with the patient and family. After consideration of risks, benefits and other options for treatment, the patient has consented to  Procedure(s) with comments: ESOPHAGOGASTRODUODENOSCOPY (EGD) (N/A) - 1:00pm MALONEY DILATION (N/A) as a surgical intervention .  The patient's history has been reviewed, patient examined, no change in status, stable for surgery.  I have reviewed the patient's chart and labs.  Questions were answered to the patient's satisfaction.     Manus Rudd

## 2018-01-09 NOTE — Discharge Instructions (Signed)
Gastroesophageal Reflux Disease, Adult Normally, food travels down the esophagus and stays in the stomach to be digested. If a person has gastroesophageal reflux disease (GERD), food and stomach acid move back up into the esophagus. When this happens, the esophagus becomes sore and swollen (inflamed). Over time, GERD can make small holes (ulcers) in the lining of the esophagus. Follow these instructions at home: Diet Follow a diet as told by your doctor. You may need to avoid foods and drinks such as: Coffee and tea (with or without caffeine). Drinks that contain alcohol. Energy drinks and sports drinks. Carbonated drinks or sodas. Chocolate and cocoa. Peppermint and mint flavorings. Garlic and onions. Horseradish. Spicy and acidic foods, such as peppers, chili powder, curry powder, vinegar, hot sauces, and BBQ sauce. Citrus fruit juices and citrus fruits, such as oranges, lemons, and limes. Tomato-based foods, such as red sauce, chili, salsa, and pizza with red sauce. Fried and fatty foods, such as donuts, french fries, potato chips, and high-fat dressings. High-fat meats, such as hot dogs, rib eye steak, sausage, ham, and bacon. High-fat dairy items, such as whole milk, butter, and cream cheese. Eat small meals often. Avoid eating large meals. Avoid drinking large amounts of liquid with your meals. Avoid eating meals during the 2-3 hours before bedtime. Avoid lying down right after you eat. Do not exercise right after you eat. General instructions Pay attention to any changes in your symptoms. Take over-the-counter and prescription medicines only as told by your doctor. Do not take aspirin, ibuprofen, or other NSAIDs unless your doctor says it is okay. Do not use any tobacco products, including cigarettes, chewing tobacco, and e-cigarettes. If you need help quitting, ask your doctor. Wear loose clothes. Do not wear anything tight around your waist. Raise (elevate) the head of your bed  about 6 inches (15 cm). Try to lower your stress. If you need help doing this, ask your doctor. If you are overweight, lose an amount of weight that is healthy for you. Ask your doctor about a safe weight loss goal. Keep all follow-up visits as told by your doctor. This is important. Contact a doctor if: You have new symptoms. You lose weight and you do not know why it is happening. You have trouble swallowing, or it hurts to swallow. You have wheezing or a cough that keeps happening. Your symptoms do not get better with treatment. You have a hoarse voice. Get help right away if: You have pain in your arms, neck, jaw, teeth, or back. You feel sweaty, dizzy, or light-headed. You have chest pain or shortness of breath. You throw up (vomit) and your throw up looks like blood or coffee grounds. You pass out (faint). Your poop (stool) is bloody or black. You cannot swallow, drink, or eat. This information is not intended to replace advice given to you by your health care provider. Make sure you discuss any questions you have with your health care provider. Document Released: 04/29/2008 Document Revised: 04/18/2016 Document Reviewed: 03/08/2015 Elsevier Interactive Patient Education  2018 [employer]. EGD Discharge instructions Please read the instructions outlined below and refer to this sheet in the next few weeks. These discharge instructions provide you with general information on caring for yourself after you leave the hospital. Your doctor may also give you specific instructions. While your treatment has been planned according to the most current medical practices available, unavoidable complications occasionally occur. If you have any problems or questions after discharge, please call your doctor. ACTIVITY  You  may resume your regular activity but move at a slower pace for the next 24 hours.   Take frequent rest periods for the next 24 hours.   Walking will help expel (get rid of)  the air and reduce the bloated feeling in your abdomen.   No driving for 24 hours (because of the anesthesia (medicine) used during the test).   You may shower.   Do not sign any important legal documents or operate any machinery for 24 hours (because of the anesthesia used during the test).  NUTRITION  Drink plenty of fluids.   You may resume your normal diet.   Begin with a light meal and progress to your normal diet.   Avoid alcoholic beverages for 24 hours or as instructed by your caregiver.  MEDICATIONS  You may resume your normal medications unless your caregiver tells you otherwise.  WHAT YOU CAN EXPECT TODAY  You may experience abdominal discomfort such as a feeling of fullness or gas pains.  FOLLOW-UP  Your doctor will discuss the results of your test with you.  SEEK IMMEDIATE MEDICAL ATTENTION IF ANY OF THE FOLLOWING OCCUR:  Excessive nausea (feeling sick to your stomach) and/or vomiting.   Severe abdominal pain and distention (swelling).   Trouble swallowing.   Temperature over 101 F (37.8 C).   Rectal bleeding or vomiting of blood.    GERD information provided  Stop omeprazole; begin Protonix 40 mg twice daily  Office visit with [place] in 3 months; you will need your esophagus stretched again at that time  Further recommendations to follow pending review of pathology report.

## 2018-01-09 NOTE — Op Note (Signed)
[facility] Patient Name: Ronald Mcdowell Procedure Date: 01/09/2018 12:27 PM MRN: [id number] Date of Birth: June 11, 1946 Attending MD: Norvel Richards , MD CSN: [id number] Age: 72 Admit Type: Outpatient Procedure:                Upper GI endoscopy Indications:              Dysphagia Providers:                Norvel Richards, MD, Lurline Del, RN, Nelma Rothman, Technician Referring MD:              Medicines:                Midazolam 5 mg IV, Meperidine 75 mg IV Complications:            No immediate complications. Estimated blood loss:                            Minimal. Estimated Blood Loss:     Estimated blood loss was minimal. Procedure:                Pre-Anesthesia Assessment:                           - Prior to the procedure, a History and Physical                            was performed, and patient medications and                            allergies were reviewed. The patient's tolerance of                            previous anesthesia was also reviewed. The risks                            and benefits of the procedure and the sedation                            options and risks were discussed with the patient.                            All questions were answered, and informed consent                            was obtained. Prior Anticoagulants: The patient has                            taken no previous anticoagulant or antiplatelet                            agents. ASA Grade Assessment: II - A patient with  mild systemic disease. After reviewing the risks                            and benefits, the patient was deemed in                            satisfactory condition to undergo the procedure.                           After obtaining informed consent, the endoscope was                            passed under direct vision. Throughout the                            procedure, the patient's blood  pressure, pulse, and                            oxygen saturations were monitored continuously. The                            [id number] ([id number]) scope was introduced through the                            mouth, and advanced to the second part of duodenum.                            The patient tolerated the procedure well. Scope In: 12:38:12 PM Scope Out: 72:53:66 PM Total Procedure Duration: 0 hours 8 minutes 4 seconds  Findings:      Cervical esophageal web present. Would not initially admitted scope with       gentle pressure the web was traversed. The entire esophagus appeared       abnormal with a crpe paper type appearance. With passage of scope into       the stomach, there were superficial tears at the GE junction and 2       larger superficial tears at the level of the way above. Biopsies the mid       and distal esophagus taken for histologic study. No attempt at a       dedicated dilation was undertaken.      A small hiatal hernia was present.      The exam was otherwise without abnormality.      The duodenal bulb and second portion of the duodenum were normal. Impression:               Cervical/proximal esophageal web dilated as                            described above. Markedly abnormal esophagus                            suspicious for EOE?"biopsied.-                           Small hiatal hernia.                           -  The examination was otherwise normal.                           - Normal duodenal bulb and second portion of the                            duodenum.                           - Normal stomach.                           - No specimens collected. Moderate Sedation:      Moderate (conscious) sedation was administered by the endoscopy nurse       and supervised by the endoscopist. The following parameters were       monitored: oxygen saturation, heart rate, blood pressure, respiratory       rate, EKG, adequacy of pulmonary ventilation, and  response to care.       Total physician intraservice time was 13 minutes. Recommendation:           - Patient has a contact number available for                            emergencies. The signs and symptoms of potential                            delayed complications were discussed with the                            patient. Return to normal activities tomorrow.                            Written discharge instructions were provided to the                            patient.                           - Resume previous diet.                           - Continue present medications. Stop omeprazole;                            begin Protonix 40 mg twice daily. Follow up on                            pathology.                           - Repeat upper endoscopy in 3 months for                            surveillance.                           - Return to  GI clinic in 3 months. Procedure Code(s):        --- Professional ---                           [phone], Esophagogastroduodenoscopy, flexible,                            transoral; diagnostic, including collection of                            specimen(s) by brushing or washing, when performed                            (separate procedure)                           [id number], Moderate sedation services provided by the                            same physician or other qualified health care                            professional performing the diagnostic or                            therapeutic service that the sedation supports,                            requiring the presence of an independent trained                            observer to assist in the monitoring of the                            patient's level of consciousness and physiological                            status; initial 15 minutes of intraservice time,                            patient age 26 years or older Diagnosis Code(s):        --- Professional ---                            K44.9, Diaphragmatic hernia without obstruction or                            gangrene                           R13.10, Dysphagia, unspecified CPT copyright 2016 American Medical Association. All rights reserved. The codes documented in this report are preliminary and upon coder review may  be revised to meet current compliance requirements. Cristopher Estimable. Bryen Hinderman, MD Norvel Richards, MD 01/09/2018 1:01:56 PM This report has been signed electronically. Number of Addenda: 0

## 2018-01-09 NOTE — Interval H&P Note (Signed)
History and Physical Interval Note:  01/09/2018 12:29 PM  Ronald Mcdowell  has presented today for surgery, with the diagnosis of GERD, dysphagia  The various methods of treatment have been discussed with the patient and family. After consideration of risks, benefits and other options for treatment, the patient has consented to  Procedure(s) with comments: ESOPHAGOGASTRODUODENOSCOPY (EGD) (N/A) - 1:00pm MALONEY DILATION (N/A) as a surgical intervention .  The patient's history has been reviewed, patient examined, no change in status, stable for surgery.  I have reviewed the patient's chart and labs.  Questions were answered to the patient's satisfaction.     Ronald Mcdowell  No change. EGD with ED per plan.  The risks, benefits, limitations, alternatives and imponderables have been reviewed with the patient. Potential for esophageal dilation, biopsy, etc. have also been reviewed.  Questions have been answered. All parties agreeable.

## 2018-01-13 ENCOUNTER — Encounter (HOSPITAL_COMMUNITY): Payer: Self-pay | Admitting: Internal Medicine

## 2018-01-13 ENCOUNTER — Other Ambulatory Visit: Payer: Self-pay | Admitting: Adult Health

## 2018-01-13 ENCOUNTER — Telehealth: Payer: Self-pay

## 2018-01-13 ENCOUNTER — Encounter: Payer: Self-pay | Admitting: Internal Medicine

## 2018-01-13 NOTE — Telephone Encounter (Signed)
Per RMR- Send letter to patient.  Send copy of letter with path to referring provider and PCP.  Likely w eoe; needs repeat egd in 3 mos regardless of dysphagia;  plse send some info on eoe;   May need additional medication -but will see how higher dose ppi works first.

## 2018-01-13 NOTE — Telephone Encounter (Signed)
Noted. Letter sent with eoe info. Please NIC

## 2018-01-20 ENCOUNTER — Encounter (HOSPITAL_COMMUNITY): Payer: Self-pay | Admitting: Emergency Medicine

## 2018-01-20 ENCOUNTER — Emergency Department (HOSPITAL_COMMUNITY): Payer: Medicare Other

## 2018-01-20 ENCOUNTER — Encounter: Payer: Self-pay | Admitting: Family Medicine

## 2018-01-20 ENCOUNTER — Emergency Department (HOSPITAL_COMMUNITY)
Admission: EM | Admit: 2018-01-20 | Discharge: 2018-01-20 | Disposition: A | Discharge status: Home / Self Care | Payer: Medicare Other | Attending: Emergency Medicine | Admitting: Emergency Medicine

## 2018-01-20 ENCOUNTER — Other Ambulatory Visit: Payer: Self-pay

## 2018-01-20 DIAGNOSIS — I252 Old myocardial infarction: Secondary | ICD-10-CM | POA: Insufficient documentation

## 2018-01-20 DIAGNOSIS — R072 Precordial pain: Secondary | ICD-10-CM | POA: Diagnosis not present

## 2018-01-20 DIAGNOSIS — Z79899 Other long term (current) drug therapy: Secondary | ICD-10-CM | POA: Insufficient documentation

## 2018-01-20 DIAGNOSIS — Z8673 Personal history of transient ischemic attack (TIA), and cerebral infarction without residual deficits: Secondary | ICD-10-CM | POA: Insufficient documentation

## 2018-01-20 DIAGNOSIS — F17210 Nicotine dependence, cigarettes, uncomplicated: Secondary | ICD-10-CM | POA: Diagnosis not present

## 2018-01-20 DIAGNOSIS — I2510 Atherosclerotic heart disease of native coronary artery without angina pectoris: Secondary | ICD-10-CM | POA: Diagnosis not present

## 2018-01-20 DIAGNOSIS — R079 Chest pain, unspecified: Secondary | ICD-10-CM | POA: Diagnosis not present

## 2018-01-20 DIAGNOSIS — I10 Essential (primary) hypertension: Secondary | ICD-10-CM | POA: Insufficient documentation

## 2018-01-20 DIAGNOSIS — R110 Nausea: Secondary | ICD-10-CM | POA: Diagnosis not present

## 2018-01-20 DIAGNOSIS — I700 Atherosclerosis of aorta: Secondary | ICD-10-CM | POA: Insufficient documentation

## 2018-01-20 DIAGNOSIS — R42 Dizziness and giddiness: Secondary | ICD-10-CM | POA: Diagnosis not present

## 2018-01-20 DIAGNOSIS — R0602 Shortness of breath: Secondary | ICD-10-CM | POA: Diagnosis not present

## 2018-01-20 LAB — CBC
HEMATOCRIT: 42.4 % (ref 39.0–52.0)
HEMOGLOBIN: 13.8 g/dL (ref 13.0–17.0)
MCH: 29.1 pg (ref 26.0–34.0)
MCHC: 32.5 g/dL (ref 30.0–36.0)
MCV: 89.3 fL (ref 78.0–100.0)
Platelets: 188 10*3/uL (ref 150–400)
RBC: 4.75 MIL/uL (ref 4.22–5.81)
RDW: 14.9 % (ref 11.5–15.5)
WBC: 9.5 10*3/uL (ref 4.0–10.5)

## 2018-01-20 LAB — BASIC METABOLIC PANEL
ANION GAP: 9 (ref 5–15)
BUN: 13 mg/dL (ref 6–20)
CALCIUM: 9.4 mg/dL (ref 8.9–10.3)
CO2: 25 mmol/L (ref 22–32)
Chloride: 106 mmol/L (ref 101–111)
Creatinine, Ser: 1.03 mg/dL (ref 0.61–1.24)
GFR calc non Af Amer: >60 mL/min (ref >60)
Glucose, Bld: 99 mg/dL (ref 65–99)
POTASSIUM: 3.1 mmol/L — AB (ref 3.5–5.1)
SODIUM: 140 mmol/L (ref 135–145)

## 2018-01-20 LAB — I-STAT TROPONIN, ED
TROPONIN I, POC: 0 ng/mL (ref 0.00–0.08)
Troponin i, poc: 0.01 ng/mL (ref 0.00–0.08)

## 2018-01-20 MED ORDER — NITROGLYCERIN 0.4 MG SL SUBL: 0.4000 mg | SUBLINGUAL_TABLET | SUBLINGUAL | 0 refills | Status: DC | PRN

## 2018-01-20 MED ORDER — NITROGLYCERIN 0.4 MG SL SUBL
0.4000 mg | SUBLINGUAL_TABLET | SUBLINGUAL | Status: DC | PRN
  Administered 2018-01-20: 0.4 mg via SUBLINGUAL
  Filled 2018-01-20: qty 1

## 2018-01-20 MED ORDER — POTASSIUM CHLORIDE CRYS ER 20 MEQ PO TBCR
40.0000 meq | EXTENDED_RELEASE_TABLET | Freq: Once | ORAL | Status: AC
  Administered 2018-01-20: 40 meq via ORAL
  Filled 2018-01-20: qty 2

## 2018-01-20 MED ORDER — ASPIRIN 81 MG PO CHEW
324.0000 mg | CHEWABLE_TABLET | Freq: Once | ORAL | Status: AC
  Administered 2018-01-20: 243 mg via ORAL
  Filled 2018-01-20: qty 4

## 2018-01-20 NOTE — ED Notes (Signed)
Pt remains  chest pain/ pressure free

## 2018-01-20 NOTE — ED Notes (Signed)
Pt continues to deny chest pain or pressure
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
## 2018-01-20 NOTE — Discharge Instructions (Signed)
Follow up closely with your cardiologist

## 2018-01-20 NOTE — ED Notes (Signed)
Pt states his chest is not hurting at this time.  States my chest just feels tight.  SL NTG given.

## 2018-01-20 NOTE — ED Triage Notes (Signed)
Pt c/o of left sided chest pain with left arm numbness and SOB starting 20 mins ago. HX of MI in the past.

## 2018-02-02 ENCOUNTER — Encounter: Payer: Self-pay | Admitting: Family Medicine

## 2018-03-17 ENCOUNTER — Ambulatory Visit: Payer: Medicare Other | Admitting: Nurse Practitioner

## 2018-03-26 DIAGNOSIS — K2100 Gastro-esophageal reflux disease with esophagitis, without bleeding: Secondary | ICD-10-CM | POA: Insufficient documentation

## 2018-03-26 DIAGNOSIS — F17200 Nicotine dependence, unspecified, uncomplicated: Secondary | ICD-10-CM | POA: Diagnosis not present

## 2018-03-29 DIAGNOSIS — K7689 Other specified diseases of liver: Secondary | ICD-10-CM | POA: Diagnosis not present

## 2018-03-29 DIAGNOSIS — Z7982 Long term (current) use of aspirin: Secondary | ICD-10-CM | POA: Diagnosis not present

## 2018-03-29 DIAGNOSIS — E785 Hyperlipidemia, unspecified: Secondary | ICD-10-CM | POA: Diagnosis not present

## 2018-03-29 DIAGNOSIS — R1032 Left lower quadrant pain: Secondary | ICD-10-CM | POA: Diagnosis not present

## 2018-03-29 DIAGNOSIS — R42 Dizziness and giddiness: Secondary | ICD-10-CM | POA: Diagnosis not present

## 2018-03-29 DIAGNOSIS — I10 Essential (primary) hypertension: Secondary | ICD-10-CM | POA: Diagnosis not present

## 2018-03-29 DIAGNOSIS — F17200 Nicotine dependence, unspecified, uncomplicated: Secondary | ICD-10-CM | POA: Diagnosis not present

## 2018-03-29 DIAGNOSIS — K5900 Constipation, unspecified: Secondary | ICD-10-CM | POA: Diagnosis not present

## 2018-03-29 DIAGNOSIS — Z955 Presence of coronary angioplasty implant and graft: Secondary | ICD-10-CM | POA: Diagnosis not present

## 2018-03-29 DIAGNOSIS — Z79899 Other long term (current) drug therapy: Secondary | ICD-10-CM | POA: Diagnosis not present

## 2018-03-29 DIAGNOSIS — N390 Urinary tract infection, site not specified: Secondary | ICD-10-CM | POA: Diagnosis not present

## 2018-03-29 DIAGNOSIS — I252 Old myocardial infarction: Secondary | ICD-10-CM | POA: Diagnosis not present

## 2018-05-01 DIAGNOSIS — Z955 Presence of coronary angioplasty implant and graft: Secondary | ICD-10-CM | POA: Diagnosis not present

## 2018-05-01 DIAGNOSIS — Z79899 Other long term (current) drug therapy: Secondary | ICD-10-CM | POA: Diagnosis not present

## 2018-05-01 DIAGNOSIS — B368 Other specified superficial mycoses: Secondary | ICD-10-CM | POA: Diagnosis not present

## 2018-05-01 DIAGNOSIS — I252 Old myocardial infarction: Secondary | ICD-10-CM | POA: Diagnosis not present

## 2018-05-01 DIAGNOSIS — B359 Dermatophytosis, unspecified: Secondary | ICD-10-CM | POA: Diagnosis not present

## 2018-05-01 DIAGNOSIS — E7800 Pure hypercholesterolemia, unspecified: Secondary | ICD-10-CM | POA: Diagnosis not present

## 2018-05-01 DIAGNOSIS — I10 Essential (primary) hypertension: Secondary | ICD-10-CM | POA: Diagnosis not present

## 2018-05-01 DIAGNOSIS — N481 Balanitis: Secondary | ICD-10-CM | POA: Diagnosis not present

## 2018-05-01 DIAGNOSIS — F17200 Nicotine dependence, unspecified, uncomplicated: Secondary | ICD-10-CM | POA: Diagnosis not present

## 2018-05-01 DIAGNOSIS — Z7982 Long term (current) use of aspirin: Secondary | ICD-10-CM | POA: Diagnosis not present

## 2018-05-03 ENCOUNTER — Other Ambulatory Visit: Payer: Self-pay | Admitting: Adult Health

## 2018-06-05 ENCOUNTER — Ambulatory Visit: Payer: Medicare Other | Admitting: Family Medicine

## 2018-07-02 ENCOUNTER — Other Ambulatory Visit: Payer: Self-pay | Admitting: Internal Medicine

## 2018-08-03 ENCOUNTER — Encounter: Payer: Self-pay | Admitting: Cardiovascular Disease

## 2018-08-03 ENCOUNTER — Ambulatory Visit (INDEPENDENT_AMBULATORY_CARE_PROVIDER_SITE_OTHER): Payer: Medicare Other | Admitting: Cardiovascular Disease

## 2018-08-03 VITALS — BP 138/72 | HR 53 | Ht 65.0 in | Wt 146.0 lb

## 2018-08-03 DIAGNOSIS — I255 Ischemic cardiomyopathy: Secondary | ICD-10-CM

## 2018-08-03 DIAGNOSIS — I10 Essential (primary) hypertension: Secondary | ICD-10-CM

## 2018-08-03 DIAGNOSIS — I519 Heart disease, unspecified: Secondary | ICD-10-CM

## 2018-08-03 DIAGNOSIS — E785 Hyperlipidemia, unspecified: Secondary | ICD-10-CM

## 2018-08-03 DIAGNOSIS — I25118 Atherosclerotic heart disease of native coronary artery with other forms of angina pectoris: Secondary | ICD-10-CM | POA: Diagnosis not present

## 2018-08-03 DIAGNOSIS — Z720 Tobacco use: Secondary | ICD-10-CM | POA: Diagnosis not present

## 2018-08-03 NOTE — Progress Notes (Signed)
SUBJECTIVE: The patient presents for routine cardiovascular followup. He has a history of coronary artery disease with stenting of the left circumflex coronary artery in September 2014. At that time he was noted to have significant RCA disease, with a 40% stenosis in the proximal vessel followed by a long 80% stenosis in the proximal to mid vessel. The distal vessel was diffusely diseased to 70%. It was noted at that time that if he were to have recalcitrant chest pain refractory to medical therapy, the RCA would require extensive stenting. He also has hypertension, hyperlipidemia, tobacco abuse, and an ischemic cardiomyopathy.  Echocardiogram on 04/11/15 showed mild left ventricular dilatation with mildly reduced left ventricular systolic function, EF 32-99%, inferior and lateral wall motion abnormalities, and insignificant valvular regurgitation with moderate left atrial dilatation.  The patient denies any symptoms of chest pain, palpitations, shortness of breath, lightheadedness, dizziness, leg swelling, orthopnea, PND, and syncope.  He continues to smoke a pack of cigarettes daily.  He told me that his wife recently underwent extensive vascular surgery and is now recovering at home.    Soc Hx: Lived in [place] for over 60 yrs, moved to [facility] in 2009.  Review of Systems: As per "subjective", otherwise negative.  No Known Allergies  Current Outpatient Medications  Medication Sig Dispense Refill  . amLODipine (NORVASC) 10 MG tablet TAKE 1 TABLET BY MOUTH EVERY DAY 90 tablet 1  . aspirin EC 81 MG tablet Take 81 mg by mouth daily.    [name] atorvastatin (LIPITOR) 40 MG tablet Take 1 tablet (40 mg total) by mouth at bedtime. Reported on 11/30/2015 30 tablet 11  . bisoprolol (ZEBETA) 10 MG tablet Take 0.5 tablets (5 mg total) by mouth daily.    [name] lisinopril (PRINIVIL,ZESTRIL) 20 MG tablet Take 1 tablet (20 mg total) by mouth daily. 90 tablet 3  . nitroGLYCERIN (NITROSTAT) 0.4 MG SL tablet  Place 1 tablet (0.4 mg total) under the tongue every 5 (five) minutes as needed for chest pain. 30 tablet 0  . pantoprazole (PROTONIX) 40 MG tablet TAKE ONE TABLET BY MOUTH TWICE DAILY 60 tablet 5   No current facility-administered medications for this visit.     Past Medical History:  Diagnosis Date  . Anemia   . Coronary artery disease    a. h/o MI in 2000 w/ prior LCX stenting;  b. 7.2014 Abnl Cardiolite, EF 41%, mod-large inferolat scar;  c. 07/2013 Cath/PCI: LM nl, LAD 10-20, D1 40-50p, LCX 95-99 @ distal stent margin (2.5x20 Promus Premier DES), RCA dom, 40p, 54m, 70d, EF 35-40%.  . Elevated cholesterol   . GERD (gastroesophageal reflux disease)   . Hemorrhoids   . Hypertension   . Ischemic cardiomyopathy    a. 07/2013 EF 35-40% by LV gram.  . Myocardial infarction ([place])   . Reflux esophagitis   . Spondylosis of cervical joint    C3-4, C6-7  . Tobacco user     Past Surgical History:  Procedure Laterality Date  . BIOPSY  01/09/2018   Procedure: BIOPSY;  Surgeon: Daneil Dolin, MD;  Location: [facility] ENDO SU[facility];  Service: Endoscopy;;  esophageal biopsies  . CARDIAC CATHETERIZATION    . COLONOSCOPY    . COLONOSCOPY N/A 01/25/2016   RMR: normal ileocolonoscopy ( anal canal hemorrhoids)  . CORONARY ANGIOPLASTY WITH STENT PLACEMENT  08/23/2013   mid circumflex  DES    by Dr Martinique  . CORONARY STENT PLACEMENT  2000  . ESOPHAGOGASTRODUODENOSCOPY N/A 01/25/2016  RMR: Reflux esophagitits. Cervical web with passage of the scope. Status post esophageal biosy. Hiatal hernia.   [name] ESOPHAGOGASTRODUODENOSCOPY N/A 01/09/2018   Procedure: ESOPHAGOGASTRODUODENOSCOPY (EGD);  Surgeon: Daneil Dolin, MD;  Location: [facility];  Service: Endoscopy;  Laterality: N/A;  1:00pm  . LEFT HEART CATHETERIZATION WITH CORONARY ANGIOGRAM N/A 08/23/2013   Procedure: LEFT HEART CATHETERIZATION WITH CORONARY ANGIOGRAM;  Surgeon: Peter M Martinique, MD;  Location: [facility] CATH LAB;  Service: Cardiovascular;  Laterality:  N/A;  . [name] DILATION N/A 01/09/2018   Procedure: [name] DILATION;  Surgeon: Daneil Dolin, MD;  Location: [facility] END[facility];  Service: Endoscopy;  Laterality: N/A;  . TRANSURETHRAL RESECTION OF PROSTATE  01/09/2012   Procedure: TRANSURETHRAL RESECTION OF THE PROSTATE (TURP);  Surgeon: Marissa Nestle, MD;  Location: [facility] ORS;  Service: Urology;  Laterality: N/A;    Social History   Socioeconomic History  . Marital status: Married    Spouse name: [name]  . Number of children: Not on file  . Years of education: 11th grade  . Highest education level: 11th grade  Occupational History  . Occupation: Librarian, academic  Social Needs  . Financial resource strain: Not hard at all  . Food insecurity:    Worry: Never true    Inability: Never true  . Transportation needs:    Medical: No    Non-medical: No  Tobacco Use  . Smoking status: Current Every Day Smoker    Packs/day: 1.00    Years: 40.00    Pack years: 40.00    Types: Cigarettes    Start date: 12/09/1965  . Smokeless tobacco: Never Used  . Tobacco comment: 3/4 pack of cigarettes daily  Substance and Sexual Activity  . Alcohol use: No    Alcohol/week: 0.0 standard drinks  . Drug use: No  . Sexual activity: Yes    Birth control/protection: None  Lifestyle  . Physical activity:    Days per week: 5 days    Minutes per session: 30 min  . Stress: Not at all  Relationships  . Social connections:    Talks on phone: Not on file    Gets together: Not on file    Attends religious service: Not on file    Active member of club or organization: Not on file    Attends meetings of clubs or organizations: Not on file    Relationship status: Not on file  . Intimate partner violence:    Fear of current or ex partner: Not on file    Emotionally abused: Not on file    Physically abused: Not on file    Forced sexual activity: Not on file  Other Topics Concern  . Not on file  Social History Narrative   Pt gets regular exercise.      Vitals:   08/03/18 1259  BP: 138/72  Pulse: (!) 53  SpO2: 98%  Weight: 146 lb (66.2 kg)  Height: 5\' 5"  (1.651 m)    Wt Readings from Last 3 Encounters:  08/03/18 146 lb (66.2 kg)  01/20/18 158 lb (71.7 kg)  01/09/18 158 lb (71.7 kg)     PHYSICAL EXAM General: NAD HEENT: Normal. Neck: No JVD, no thyromegaly. Lungs: Clear to auscultation bilaterally with normal respiratory effort. CV: Regular rate and rhythm, normal S1/S2, no S3/S4, no murmur. No pretibial or periankle edema.  No carotid bruit.   Abdomen: Soft, nontender, no distention.  Neurologic: Alert and oriented.  Psych: Normal affect. Skin: Normal. Musculoskeletal: No gross deformities.  ECG: Reviewed above under Subjective   Labs: Lab Results  Component Value Date/Time   K 3.1 (L) 01/20/2018 09:56 AM   BUN 13 01/20/2018 09:56 AM   CREATININE 1.03 01/20/2018 09:56 AM   CREATININE 1.14 11/04/2017 02:17 PM   ALT 26 11/04/2017 02:17 PM   TSH 0.674 04/10/2015 08:00 PM   HGB 13.8 01/20/2018 09:56 AM     Lipids: Lab Results  Component Value Date/Time   LDLCALC 79 11/04/2017 02:17 PM   CHOL 131 11/04/2017 02:17 PM   TRIG 80 11/04/2017 02:17 PM   HDL 36 (L) 11/04/2017 02:17 PM       ASSESSMENT AND PLAN:  1. CAD: Stable ischemic heart disease. Continue current therapy with ASA, atorvastatin, and bisoprolol.  2. Essential HTN: Controlled. No changes.  3. Hyperlipidemia:  LDL 79 on 11/04/2017. Continue statin.  4. Tobacco abuse history: Again says he wants to quit but continues to smoke 1 ppd.  5. Ischemic cardiomyopathy: No signs of heart failure. Continue ACEI and beta blocker.   Disposition: Follow up 1 year   Kate Sable, M.D., F.A.C.C.

## 2018-08-03 NOTE — Patient Instructions (Signed)

## 2018-09-06 DIAGNOSIS — H6501 Acute serous otitis media, right ear: Secondary | ICD-10-CM | POA: Diagnosis not present

## 2018-09-06 DIAGNOSIS — R229 Localized swelling, mass and lump, unspecified: Secondary | ICD-10-CM | POA: Diagnosis not present

## 2018-09-06 DIAGNOSIS — R2231 Localized swelling, mass and lump, right upper limb: Secondary | ICD-10-CM | POA: Diagnosis not present

## 2018-10-21 DIAGNOSIS — F17200 Nicotine dependence, unspecified, uncomplicated: Secondary | ICD-10-CM | POA: Diagnosis not present

## 2018-10-21 DIAGNOSIS — R21 Rash and other nonspecific skin eruption: Secondary | ICD-10-CM | POA: Diagnosis not present

## 2018-10-21 DIAGNOSIS — Z125 Encounter for screening for malignant neoplasm of prostate: Secondary | ICD-10-CM | POA: Diagnosis not present

## 2018-10-21 DIAGNOSIS — I2510 Atherosclerotic heart disease of native coronary artery without angina pectoris: Secondary | ICD-10-CM | POA: Diagnosis not present

## 2018-10-21 DIAGNOSIS — E559 Vitamin D deficiency, unspecified: Secondary | ICD-10-CM | POA: Diagnosis not present

## 2018-10-21 DIAGNOSIS — R5383 Other fatigue: Secondary | ICD-10-CM | POA: Diagnosis not present

## 2018-10-21 DIAGNOSIS — Z23 Encounter for immunization: Secondary | ICD-10-CM | POA: Diagnosis not present

## 2018-10-21 DIAGNOSIS — E785 Hyperlipidemia, unspecified: Secondary | ICD-10-CM | POA: Diagnosis not present

## 2018-10-21 DIAGNOSIS — I10 Essential (primary) hypertension: Secondary | ICD-10-CM | POA: Diagnosis not present

## 2018-11-01 ENCOUNTER — Other Ambulatory Visit: Payer: Self-pay | Admitting: Cardiovascular Disease

## 2019-01-18 ENCOUNTER — Other Ambulatory Visit: Payer: Self-pay | Admitting: Gastroenterology

## 2019-01-21 DIAGNOSIS — F17200 Nicotine dependence, unspecified, uncomplicated: Secondary | ICD-10-CM | POA: Diagnosis not present

## 2019-01-21 DIAGNOSIS — I10 Essential (primary) hypertension: Secondary | ICD-10-CM | POA: Diagnosis not present

## 2019-01-21 DIAGNOSIS — I2510 Atherosclerotic heart disease of native coronary artery without angina pectoris: Secondary | ICD-10-CM | POA: Diagnosis not present

## 2019-01-21 DIAGNOSIS — E785 Hyperlipidemia, unspecified: Secondary | ICD-10-CM | POA: Diagnosis not present

## 2019-03-02 DIAGNOSIS — E785 Hyperlipidemia, unspecified: Secondary | ICD-10-CM | POA: Diagnosis not present

## 2019-03-02 DIAGNOSIS — Z7189 Other specified counseling: Secondary | ICD-10-CM | POA: Diagnosis not present

## 2019-03-02 DIAGNOSIS — I2510 Atherosclerotic heart disease of native coronary artery without angina pectoris: Secondary | ICD-10-CM | POA: Diagnosis not present

## 2019-03-02 DIAGNOSIS — I10 Essential (primary) hypertension: Secondary | ICD-10-CM | POA: Diagnosis not present

## 2019-03-02 DIAGNOSIS — F17200 Nicotine dependence, unspecified, uncomplicated: Secondary | ICD-10-CM | POA: Diagnosis not present

## 2019-03-12 ENCOUNTER — Other Ambulatory Visit: Payer: Self-pay | Admitting: Cardiovascular Disease

## 2019-04-22 DIAGNOSIS — E785 Hyperlipidemia, unspecified: Secondary | ICD-10-CM | POA: Diagnosis not present

## 2019-04-22 DIAGNOSIS — Z7189 Other specified counseling: Secondary | ICD-10-CM | POA: Diagnosis not present

## 2019-04-22 DIAGNOSIS — Z139 Encounter for screening, unspecified: Secondary | ICD-10-CM | POA: Diagnosis not present

## 2019-04-22 DIAGNOSIS — I2510 Atherosclerotic heart disease of native coronary artery without angina pectoris: Secondary | ICD-10-CM | POA: Diagnosis not present

## 2019-04-22 DIAGNOSIS — Z23 Encounter for immunization: Secondary | ICD-10-CM | POA: Diagnosis not present

## 2019-04-22 DIAGNOSIS — I10 Essential (primary) hypertension: Secondary | ICD-10-CM | POA: Diagnosis not present

## 2019-04-22 DIAGNOSIS — K219 Gastro-esophageal reflux disease without esophagitis: Secondary | ICD-10-CM | POA: Diagnosis not present

## 2019-04-22 DIAGNOSIS — R5383 Other fatigue: Secondary | ICD-10-CM | POA: Diagnosis not present

## 2019-04-22 DIAGNOSIS — E559 Vitamin D deficiency, unspecified: Secondary | ICD-10-CM | POA: Diagnosis not present

## 2019-05-24 ENCOUNTER — Other Ambulatory Visit: Payer: Self-pay | Admitting: Internal Medicine

## 2019-05-24 ENCOUNTER — Other Ambulatory Visit (HOSPITAL_COMMUNITY): Payer: Self-pay | Admitting: Internal Medicine

## 2019-05-24 DIAGNOSIS — Z87891 Personal history of nicotine dependence: Secondary | ICD-10-CM

## 2019-06-01 ENCOUNTER — Other Ambulatory Visit: Payer: Self-pay

## 2019-06-01 ENCOUNTER — Ambulatory Visit (HOSPITAL_COMMUNITY)
Admission: RE | Admit: 2019-06-01 | Discharge: 2019-06-01 | Disposition: A | Discharge status: Home / Self Care | Payer: Medicare Other | Source: Ambulatory Visit | Attending: Internal Medicine | Admitting: Internal Medicine

## 2019-06-01 DIAGNOSIS — Z136 Encounter for screening for cardiovascular disorders: Secondary | ICD-10-CM | POA: Diagnosis not present

## 2019-06-01 DIAGNOSIS — Z87891 Personal history of nicotine dependence: Secondary | ICD-10-CM | POA: Insufficient documentation

## 2019-07-11 ENCOUNTER — Other Ambulatory Visit: Payer: Self-pay | Admitting: Gastroenterology

## 2019-07-21 ENCOUNTER — Other Ambulatory Visit: Payer: Self-pay | Admitting: Cardiovascular Disease

## 2019-07-23 ENCOUNTER — Other Ambulatory Visit: Payer: Self-pay | Admitting: Cardiovascular Disease

## 2019-08-23 ENCOUNTER — Ambulatory Visit (INDEPENDENT_AMBULATORY_CARE_PROVIDER_SITE_OTHER): Payer: Medicare Other

## 2019-08-23 ENCOUNTER — Ambulatory Visit
Admission: EM | Admit: 2019-08-23 | Discharge: 2019-08-23 | Disposition: A | Discharge status: Home / Self Care | Payer: Medicare Other | Attending: Emergency Medicine | Admitting: Emergency Medicine

## 2019-08-23 ENCOUNTER — Other Ambulatory Visit: Payer: Self-pay

## 2019-08-23 DIAGNOSIS — R110 Nausea: Secondary | ICD-10-CM

## 2019-08-23 DIAGNOSIS — R195 Other fecal abnormalities: Secondary | ICD-10-CM

## 2019-08-23 DIAGNOSIS — R1011 Right upper quadrant pain: Secondary | ICD-10-CM | POA: Diagnosis not present

## 2019-08-23 DIAGNOSIS — R1012 Left upper quadrant pain: Secondary | ICD-10-CM

## 2019-08-23 DIAGNOSIS — R1030 Lower abdominal pain, unspecified: Secondary | ICD-10-CM | POA: Diagnosis not present

## 2019-08-23 DIAGNOSIS — R109 Unspecified abdominal pain: Secondary | ICD-10-CM | POA: Diagnosis not present

## 2019-08-23 DIAGNOSIS — R112 Nausea with vomiting, unspecified: Secondary | ICD-10-CM | POA: Diagnosis not present

## 2019-08-23 LAB — POCT URINALYSIS DIP (MANUAL ENTRY)
Bilirubin, UA: NEGATIVE
Glucose, UA: NEGATIVE mg/dL
Ketones, POC UA: NEGATIVE mg/dL
Nitrite, UA: NEGATIVE
Protein Ur, POC: 100 mg/dL — AB
Spec Grav, UA: 1.015 (ref 1.010–1.025)
Urobilinogen, UA: 0.2 E.U./dL
pH, UA: 5.5 (ref 5.0–8.0)

## 2019-08-23 MED ORDER — POLYETHYLENE GLYCOL 3350 17 G PO PACK: 17.0000 g | PACK | Freq: Every day | ORAL | 0 refills | Status: DC

## 2019-08-23 MED ORDER — ONDANSETRON HCL 4 MG PO TABS: 4.0000 mg | ORAL_TABLET | Freq: Four times a day (QID) | ORAL | 0 refills | Status: DC

## 2019-08-23 NOTE — Discharge Instructions (Signed)
X-rays negative for obstruction or perforation.  Radiologist mentions stool present in the RT side of colon Urine did show protein, blood and trace white blood cells.  This is stable or unchanged from past urine done in December of 2018.   Symptoms may be related to stool present in the right colon I cannot rule out gallbladder disease, pancreatitis, or appendicitis in urgent care setting.  If you are concerned for any of these diseases please go to the ED for further work-up and evaluation.  Patient is aware and would like to try outpatient therapy first.  Is aware of risk associated with this decision including missed diagnosis, organ damage, failure, and/or death.  Patient aware and would like to proceed without patient therapy  Miralax prescribed.  Take as prescribed Zofran as needed for nausea Advance diet as tolerated. Stick with a liquid/ bland diet for the next 24 hours. Avoid fatty, greasy foods or anything that does not agree with you You should follow up with PCP this week for recheck and to ensure symptoms are improving.  Please mention results of urine analysis as well.   If you experience new or worsening symptoms go to ER such as fever, chills, nausea, vomiting, diarrhea, bloody or dark tarry stools, constipation, urinary symptoms, worsening abdominal discomfort, symptoms that do not improve with medications, inability to keep fluids down, etc..[name]

## 2019-08-23 NOTE — ED Provider Notes (Signed)
[place]   [id number] 08/23/19 Arrival Time: 47  CC: ABDOMINAL DISCOMFORT  SUBJECTIVE:  Ronald Mcdowell is a 73 y.o. male who presents with complaint of abdominal discomfort that began abruptly 1 day ago.  Symptoms began after eating pork chops and gravy.  Discomfort is generalized about the RUQ and epigastric region.  Describes as constant, sharp, "swells" and "burns" in character.  Has tried TUMS without relief.  Worse with activity, and eating.  Improved with sitting.  Denies similar symptoms in the past.  Last BM this morning with decreased amount.  Complains decreased appetite, and nausea (improving).  Denies fever, chills, appetite changes, vomiting, chest pain, SOB, diarrhea, constipation, hematochezia, melena, dysuria, difficulty urinating, increased frequency or urgency, flank pain, loss of bowel or bladder function.  Colonoscopy last year and normal.    ROS: As per HPI.  All other pertinent ROS negative.     Past Medical History:  Diagnosis Date  . Anemia   . Coronary artery disease    a. h/o MI in 2000 w/ prior LCX stenting;  b. 7.2014 Abnl Cardiolite, EF 41%, mod-large inferolat scar;  c. 07/2013 Cath/PCI: LM nl, LAD 10-20, D1 40-50p, LCX 95-99 @ distal stent margin (2.5x20 Promus Premier DES), RCA dom, 40p, 23m, 70d, EF 35-40%.  . Elevated cholesterol   . GERD (gastroesophageal reflux disease)   . Hemorrhoids   . Hypertension   . Ischemic cardiomyopathy    a. 07/2013 EF 35-40% by LV gram.  . Myocardial infarction ([place])   . Reflux esophagitis   . Spondylosis of cervical joint    C3-4, C6-7  . Tobacco user    Past Surgical History:  Procedure Laterality Date  . BIOPSY  01/09/2018   Procedure: BIOPSY;  Surgeon: Daneil Dolin, MD;  Location: [facility];  Service: Endoscopy;;  esophageal biopsies  . CARDIAC CATHETERIZATION    . COLONOSCOPY    . COLONOSCOPY N/A 01/25/2016   RMR: normal ileocolonoscopy ( anal canal hemorrhoids)  . CORONARY ANGIOPLASTY  WITH STENT PLACEMENT  08/23/2013   mid circumflex  DES    by Dr Martinique  . CORONARY STENT PLACEMENT  2000  . ESOPHAGOGASTRODUODENOSCOPY N/A 01/25/2016   RMR: Reflux esophagitits. Cervical web with passage of the scope. Status post esophageal biosy. Hiatal hernia.   [name] ESOPHAGOGASTRODUODENOSCOPY N/A 01/09/2018   Procedure: ESOPHAGOGASTRODUODENOSCOPY (EGD);  Surgeon: Daneil Dolin, MD;  Location: [facility];  Service: Endoscopy;  Laterality: N/A;  1:00pm  . LEFT HEART CATHETERIZATION WITH CORONARY ANGIOGRAM N/A 08/23/2013   Procedure: LEFT HEART CATHETERIZATION WITH CORONARY ANGIOGRAM;  Surgeon: Peter M Martinique, MD;  Location: [facility] CATH LAB;  Service: Cardiovascular;  Laterality: N/A;  . [name] DILATION N/A 01/09/2018   Procedure: [name] DILATION;  Surgeon: Daneil Dolin, MD;  Location: [facility];  Service: Endoscopy;  Laterality: N/A;  . TRANSURETHRAL RESECTION OF PROSTATE  01/09/2012   Procedure: TRANSURETHRAL RESECTION OF THE PROSTATE (TURP);  Surgeon: Marissa Nestle, MD;  Location: [facility];  Service: Urology;  Laterality: N/A;   No Known Allergies No current facility-administered medications on file prior to encounter.    Current Outpatient Medications on File Prior to Encounter  Medication Sig Dispense Refill  . amLODipine (NORVASC) 10 MG tablet TAKE 1 TABLET BY MOUTH DAILY 30 tablet 0  . aspirin EC 81 MG tablet Take 81 mg by mouth daily.    [name] atorvastatin (LIPITOR) 40 MG tablet Take 1 tablet (40 mg total) by mouth at bedtime. Reported  on 11/30/2015 30 tablet 11  . bisoprolol (ZEBETA) 10 MG tablet Take 0.5 tablets (5 mg total) by mouth daily.    [name] lisinopril (PRINIVIL,ZESTRIL) 20 MG tablet Take 1 tablet (20 mg total) by mouth daily. 90 tablet 3  . nitroGLYCERIN (NITROSTAT) 0.4 MG SL tablet Place 1 tablet (0.4 mg total) under the tongue every 5 (five) minutes as needed for chest pain. 30 tablet 0  . pantoprazole (PROTONIX) 40 MG tablet TAKE 1 TABLET BY MOUTH TWICE DAILY 180 tablet 3    Social History   Socioeconomic History  . Marital status: Married    Spouse name: [name]  . Number of children: Not on file  . Years of education: 11th grade  . Highest education level: 11th grade  Occupational History  . Occupation: Librarian, academic  Social Needs  . Financial resource strain: Not hard at all  . Food insecurity    Worry: Never true    Inability: Never true  . Transportation needs    Medical: No    Non-medical: No  Tobacco Use  . Smoking status: Current Every Day Smoker    Packs/day: 1.00    Years: 40.00    Pack years: 40.00    Types: Cigarettes    Start date: 12/09/1965  . Smokeless tobacco: Never Used  . Tobacco comment: 3/4 pack of cigarettes daily  Substance and Sexual Activity  . Alcohol use: No    Alcohol/week: 0.0 standard drinks  . Drug use: No  . Sexual activity: Yes    Birth control/protection: None  Lifestyle  . Physical activity    Days per week: 5 days    Minutes per session: 30 min  . Stress: Not at all  Relationships  . Social Herbalist on phone: Not on file    Gets together: Not on file    Attends religious service: Not on file    Active member of club or organization: Not on file    Attends meetings of clubs or organizations: Not on file    Relationship status: Not on file  . Intimate partner violence    Fear of current or ex partner: Not on file    Emotionally abused: Not on file    Physically abused: Not on file    Forced sexual activity: Not on file  Other Topics Concern  . Not on file  Social History Narrative   Pt gets regular exercise.   Family History  Problem Relation Age of Onset  . Early death Mother        childbirth  . Heart attack Father   . Cancer Father   . Heart disease Brother        CABG  . Heart disease Brother        slight heart attack  . Anesthesia problems Neg Hx   . Hypotension Neg Hx   . Malignant hyperthermia Neg Hx   . Pseudochol deficiency Neg Hx   . Colon cancer Neg Hx   . Liver  disease Neg Hx   . Gastric cancer Neg Hx   . Esophageal cancer Neg Hx      OBJECTIVE:  Vitals:   08/23/19 1435  BP: (!) 168/78  Pulse: (!) 59  Resp: 16  Temp: 97.8 F (36.6 C)  TempSrc: Oral  SpO2: 95%    General appearance: Alert; well-appear, nontoxic HEENT: NCAT.  Oropharynx clear.  Lungs: clear to auscultation bilaterally without adventitious breath sounds Heart: regular rate and rhythm.  Radial  pulses 2+ symmetrical bilaterally Abdomen: soft, non-distended; mild decreased bowel sounds; diffusely TTP over epigastric and RUQ with light and deep palpation; nontender at McBurney's point;  negative rebound; + guarding Back: no CVA tenderness Extremities: no edema; symmetrical with no gross deformities Skin: warm and dry Neurologic: normal gait Psychological: alert and cooperative; normal mood and affect  LABS: Results for orders placed or performed during the hospital encounter of 08/23/19 (from the past 24 hour(s))  POCT urinalysis dipstick     Status: Abnormal   Collection Time: 08/23/19  3:20 PM  Result Value Ref Range   Color, UA yellow yellow   Clarity, UA clear clear   Glucose, UA negative negative mg/dL   Bilirubin, UA negative negative   Ketones, POC UA negative negative mg/dL   Spec Grav, UA 1.015 1.010 - 1.025   Blood, UA small (A) negative   pH, UA 5.5 5.0 - 8.0   Protein Ur, POC =100 (A) negative mg/dL   Urobilinogen, UA 0.2 0.2 or 1.0 E.U./dL   Nitrite, UA Negative Negative   Leukocytes, UA Trace (A) Negative    DIAGNOSTIC STUDIES: Dg Abd 2 Views  Result Date: 08/23/2019 CLINICAL DATA:  Patient states that he has mid to lower abdominal pain discomfort that started last night, not able to keep food down, nausea. No Hx EXAM: ABDOMEN - 2 VIEW COMPARISON:  Abdominal radiograph 01/28/2016 FINDINGS: There are no dilated loops of bowel to suggest obstruction. No evidence of free air. Stool is seen throughout the right-sided colon. Visualized portions of the  lung bases are clear. There are no acute findings in the visualized skeleton. IMPRESSION: No evidence for bowel obstruction or free air. Electronically Signed   By: Audie Pinto M.D.   On: 08/23/2019 15:23    X-rays negative for obvious obstruction, free air under the diaphragm.  RUQ with stool.  I have reviewed the x-rays myself and the radiologist interpretation. I am in agreement with the radiologist interpretation.     ASSESSMENT & PLAN:  1. Bilateral upper abdominal discomfort   2. Nausea without vomiting   3. Decreased stool caliber     Meds ordered this encounter  Medications  . ondansetron (ZOFRAN) 4 MG tablet    Sig: Take 1 tablet (4 mg total) by mouth every 6 (six) hours.    Dispense:  12 tablet    Refill:  0    Order Specific Question:   Supervising Provider    Answer:   Raylene Everts [id number]  . polyethylene glycol (MIRALAX / GLYCOLAX) 17 g packet    Sig: Take 17 g by mouth daily.    Dispense:  14 each    Refill:  0    Order Specific Question:   Supervising Provider    Answer:   Raylene Everts [id number]    X-rays negative for obstruction or perforation.  Radiologist mentions stool present in the RT side of colon Urine did show protein, blood and trace white blood cells.  This is stable or unchanged from past urine done in December of 2018.   Symptoms may be related to stool present in the right colon I cannot rule out gallbladder disease, pancreatitis, or appendicitis in urgent care setting.  If you are concerned for any of these diseases please go to the ED for further work-up and evaluation.  Patient is aware and would like to try outpatient therapy first.  Is aware of risk associated with this decision including missed diagnosis, organ damage, failure, and/or  death.  Patient aware and would like to proceed without patient therapy  Miralax prescribed.  Take as prescribed Zofran as needed for nausea Advance diet as tolerated. Stick with a liquid/ bland  diet for the next 24 hours. Avoid fatty, greasy foods or anything that does not agree with you You should follow up with PCP this week for recheck and to ensure symptoms are improving.  Please mention results of urine analysis as well.   If you experience new or worsening symptoms go to ER such as fever, chills, nausea, vomiting, diarrhea, bloody or dark tarry stools, constipation, urinary symptoms, worsening abdominal discomfort, symptoms that do not improve with medications, inability to keep fluids down, etc...  Reviewed expectations re: course of current medical issues. Questions answered. Outlined signs and symptoms indicating need for more acute intervention. Patient verbalized understanding. After Visit Summary given.   Lestine Box, PA-C 08/23/19 1542

## 2019-08-23 NOTE — ED Triage Notes (Signed)
Pt presents to UC c/o stomach pain since last night. Pt reports cramping and pain in upper abdomen. Pt reports nausea but no vomiting. Denies diarrhea. Pt reports being able to keep fluids down but not food.

## 2019-08-24 ENCOUNTER — Ambulatory Visit (INDEPENDENT_AMBULATORY_CARE_PROVIDER_SITE_OTHER): Payer: Medicare Other | Admitting: Internal Medicine

## 2019-08-24 ENCOUNTER — Encounter (INDEPENDENT_AMBULATORY_CARE_PROVIDER_SITE_OTHER): Payer: Self-pay | Admitting: Internal Medicine

## 2019-08-24 VITALS — BP 120/56 | HR 60 | Ht 65.0 in | Wt 132.0 lb

## 2019-08-24 DIAGNOSIS — K2100 Gastro-esophageal reflux disease with esophagitis, without bleeding: Secondary | ICD-10-CM

## 2019-08-24 DIAGNOSIS — E785 Hyperlipidemia, unspecified: Secondary | ICD-10-CM | POA: Diagnosis not present

## 2019-08-24 DIAGNOSIS — I25118 Atherosclerotic heart disease of native coronary artery with other forms of angina pectoris: Secondary | ICD-10-CM | POA: Diagnosis not present

## 2019-08-24 DIAGNOSIS — R42 Dizziness and giddiness: Secondary | ICD-10-CM

## 2019-08-24 DIAGNOSIS — I10 Essential (primary) hypertension: Secondary | ICD-10-CM | POA: Diagnosis not present

## 2019-08-24 DIAGNOSIS — E559 Vitamin D deficiency, unspecified: Secondary | ICD-10-CM | POA: Insufficient documentation

## 2019-08-24 DIAGNOSIS — Z720 Tobacco use: Secondary | ICD-10-CM

## 2019-08-24 HISTORY — DX: Vitamin D deficiency, unspecified: E55.9

## 2019-08-24 NOTE — Progress Notes (Signed)
Wellness Office Visit  Subjective:  Patient ID: Ronald Mcdowell, male    DOB: 05/10/1946  Age: 73 y.o. MRN: [id number]  CC: This man comes in for follow-up of his multiple medical problems including hypertension, coronary artery disease, vitamin D deficiency, gastroesophageal reflux disease. HPI  He had gone to the urgent care yesterday for right upper quadrant abdominal discomfort.  He now reflects and says he ate something that might of caused the symptoms and the symptoms are somewhat improving.  He was prescribed Zofran which he is taking as needed. He does complain today of lightheadedness/dizziness most of the time.  I see that his lisinopril dose had been increased in the past from 10 mg daily to 20 mg daily.  He has not seen cardiology for more than a year and it is time for him to do so.  He denies any chest pain, dyspnea, palpitations or limb weakness. He continues with the higher dose of vitamin D3 5000 units daily and is tolerating this well. He continues with statin therapy without myalgia. He continues with Protonix for his gastroesophageal reflux disease. Past Medical History:  Diagnosis Date  . Anemia   . Coronary artery disease    a. h/o MI in 2000 w/ prior LCX stenting;  b. 7.2014 Abnl Cardiolite, EF 41%, mod-large inferolat scar;  c. 07/2013 Cath/PCI: LM nl, LAD 10-20, D1 40-50p, LCX 95-99 @ distal stent margin (2.5x20 Promus Premier DES), RCA dom, 40p, 63m, 70d, EF 35-40%.  . Elevated cholesterol   . GERD (gastroesophageal reflux disease)   . Hemorrhoids   . Hypertension   . Ischemic cardiomyopathy    a. 07/2013 EF 35-40% by LV gram.  . Myocardial infarction ([place])   . Reflux esophagitis   . Spondylosis of cervical joint    C3-4, C6-7  . Tobacco user   . Vitamin D deficiency disease 08/24/2019      Family History  Problem Relation Age of Onset  . Early death Mother        childbirth  . Heart attack Father   . Cancer Father   . Heart disease Brother    CABG  . Heart disease Brother        slight heart attack  . Anesthesia problems Neg Hx   . Hypotension Neg Hx   . Malignant hyperthermia Neg Hx   . Pseudochol deficiency Neg Hx   . Colon cancer Neg Hx   . Liver disease Neg Hx   . Gastric cancer Neg Hx   . 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     Current Meds  Medication Sig  . amLODipine (NORVASC) 10 MG tablet TAKE 1 TABLET BY MOUTH DAILY  . aspirin EC 81 MG tablet Take 81 mg by mouth daily.  [name] atorvastatin (LIPITOR) 40 MG tablet Take 1 tablet (40 mg total) by mouth at bedtime. Reported on 11/30/2015  . bisoprolol (ZEBETA) 5 MG tablet Take 5 mg by mouth daily.  . Cholecalciferol (VITAMIN D-3) 125 MCG (5000 UT) TABS Take 1 tablet by mouth daily.  [name] lisinopril (PRINIVIL,ZESTRIL) 20 MG tablet Take 1 tablet (20 mg total) by mouth daily.  . nitroGLYCERIN (NITROSTAT) 0.4 MG SL tablet Place 1 tablet (0.4 mg total) under the tongue every 5 (five) minutes as needed for chest pain.  [name] ondansetron (ZOFRAN) 4 MG tablet Take 1 tablet (4  mg total) by mouth every 6 (six) hours.  . pantoprazole (PROTONIX) 40 MG tablet TAKE 1 TABLET BY MOUTH TWICE DAILY     Objective:   Today's Vitals: BP (!) 120/56   Pulse 60   Ht 5\' 5"  (1.651 m)   Wt 132 lb (59.9 kg)   BMI 21.97 kg/m  Vitals with BMI 08/24/2019 08/23/2019 08/03/2018  Height 5\' 5"  - 5\' 5"   Weight 132 lbs - 146 lbs  BMI [id number]  Systolic [id number]  Diastolic 56 78 72  Pulse 60 59 53     Physical Exam    He looks systemically well.  Blood pressure is slightly on the lower side I think for his age and I wonder if this is the dizziness part he is explaining.  He is alert and orientated without any focal neurological signs.   Assessment   1. Essential hypertension   2. Coronary artery disease of native artery of native heart with stable  angina pectoris ([place])   3. Gastroesophageal reflux disease with esophagitis   4. Hyperlipidemia, unspecified hyperlipidemia type   5. Tobacco user   6. Vitamin D deficiency disease   7. Dizziness       Tests ordered Orders Placed This Encounter  Procedures  . COMPLETE METABOLIC PANEL WITH GFR  . VITAMIN D 25 Hydroxy (Vit-D Deficiency, Fractures)  . CBC     Plan: 1. He will continue with all medications for the time being for his chronic conditions above. 2. Blood work is ordered above and I will see if this sheds any light onto his symptoms.  I think we may have to reduce his lisinopril dose again. 3. I have encouraged him to make sure he goes to follow-up with cardiology as it has been over 1 year now. 4. I will have him follow-up with Sarah in about 3 months time and further recommendations will depend on the results.     Doree Albee, MD

## 2019-08-25 ENCOUNTER — Other Ambulatory Visit (INDEPENDENT_AMBULATORY_CARE_PROVIDER_SITE_OTHER): Payer: Self-pay | Admitting: Internal Medicine

## 2019-08-25 ENCOUNTER — Telehealth (INDEPENDENT_AMBULATORY_CARE_PROVIDER_SITE_OTHER): Payer: Self-pay

## 2019-08-25 LAB — COMPLETE METABOLIC PANEL WITH GFR
AG Ratio: 1.1 (calc) (ref 1.0–2.5)
ALT: 23 U/L (ref 9–46)
AST: 22 U/L (ref 10–35)
Albumin: 4.1 g/dL (ref 3.6–5.1)
Alkaline phosphatase (APISO): 51 U/L (ref 35–144)
BUN/Creatinine Ratio: 10 (calc) (ref 6–22)
BUN: 15 mg/dL (ref 7–25)
CO2: 24 mmol/L (ref 20–32)
Calcium: 9.3 mg/dL (ref 8.6–10.3)
Chloride: 102 mmol/L (ref 98–110)
Creat: 1.54 mg/dL — ABNORMAL HIGH (ref 0.70–1.18)
GFR, Est African American: 51 mL/min/{1.73_m2} — ABNORMAL LOW (ref >=60)
GFR, Est Non African American: 44 mL/min/{1.73_m2} — ABNORMAL LOW (ref >=60)
Globulin: 3.7 g/dL (calc) (ref 1.9–3.7)
Glucose, Bld: 77 mg/dL (ref 65–99)
Potassium: 3.4 mmol/L — ABNORMAL LOW (ref 3.5–5.3)
Sodium: 139 mmol/L (ref 135–146)
Total Bilirubin: 1.5 mg/dL — ABNORMAL HIGH (ref 0.2–1.2)
Total Protein: 7.8 g/dL (ref 6.1–8.1)

## 2019-08-25 LAB — CBC
HCT: 39.8 % (ref 38.5–50.0)
Hemoglobin: 13.6 g/dL (ref 13.2–17.1)
MCH: 31.1 pg (ref 27.0–33.0)
MCHC: 34.2 g/dL (ref 32.0–36.0)
MCV: 90.9 fL (ref 80.0–100.0)
MPV: 11.4 fL (ref 7.5–12.5)
Platelets: 192 10*3/uL (ref 140–400)
RBC: 4.38 10*6/uL (ref 4.20–5.80)
RDW: 13.1 % (ref 11.0–15.0)
WBC: 11.7 10*3/uL — ABNORMAL HIGH (ref 3.8–10.8)

## 2019-08-25 LAB — VITAMIN D 25 HYDROXY (VIT D DEFICIENCY, FRACTURES): Vit D, 25-Hydroxy: 75 ng/mL (ref 30–100)

## 2019-08-25 MED ORDER — LISINOPRIL 10 MG PO TABS: 10.0000 mg | ORAL_TABLET | Freq: Every day | ORAL | 3 refills | Status: DC

## 2019-08-25 NOTE — Progress Notes (Signed)
Please call patient and let him know that he is getting more dehydrated.  He needs to drink more water every day.  Also, he can reduce the lisinopril so I will send in a new prescription of lisinopril 10 mg tablet, take 1 daily.  He needs to stop the lisinopril 20 mg tablets.  Please arrange a follow-up with Judson Roch in about 1 month.  Thanks.

## 2019-08-25 NOTE — Progress Notes (Signed)
FYI-I have sent the lisinopril 10 mg tablets to [facility].  If this is not the pharmacy he wants, let me know.

## 2019-08-25 NOTE — Telephone Encounter (Signed)
-----   Message from Doree Albee, MD sent at 08/25/2019  8:47 AM EDT ----- FYI-I have sent the lisinopril 10 mg tablets to [facility] drug.  If this is not the pharmacy he wants, let me know.

## 2019-08-31 DIAGNOSIS — B359 Dermatophytosis, unspecified: Secondary | ICD-10-CM | POA: Diagnosis not present

## 2019-08-31 DIAGNOSIS — L280 Lichen simplex chronicus: Secondary | ICD-10-CM | POA: Diagnosis not present

## 2019-08-31 DIAGNOSIS — D485 Neoplasm of uncertain behavior of skin: Secondary | ICD-10-CM | POA: Diagnosis not present

## 2019-08-31 DIAGNOSIS — L439 Lichen planus, unspecified: Secondary | ICD-10-CM | POA: Diagnosis not present

## 2019-09-09 ENCOUNTER — Other Ambulatory Visit (INDEPENDENT_AMBULATORY_CARE_PROVIDER_SITE_OTHER): Payer: Self-pay | Admitting: Internal Medicine

## 2019-09-12 DIAGNOSIS — T18108A Unspecified foreign body in esophagus causing other injury, initial encounter: Secondary | ICD-10-CM | POA: Diagnosis not present

## 2019-09-12 DIAGNOSIS — I2510 Atherosclerotic heart disease of native coronary artery without angina pectoris: Secondary | ICD-10-CM | POA: Diagnosis not present

## 2019-09-12 DIAGNOSIS — I252 Old myocardial infarction: Secondary | ICD-10-CM | POA: Diagnosis not present

## 2019-09-12 DIAGNOSIS — Z955 Presence of coronary angioplasty implant and graft: Secondary | ICD-10-CM | POA: Diagnosis not present

## 2019-09-12 DIAGNOSIS — Z23 Encounter for immunization: Secondary | ICD-10-CM | POA: Diagnosis not present

## 2019-09-12 DIAGNOSIS — E7800 Pure hypercholesterolemia, unspecified: Secondary | ICD-10-CM | POA: Diagnosis not present

## 2019-09-12 DIAGNOSIS — F17210 Nicotine dependence, cigarettes, uncomplicated: Secondary | ICD-10-CM | POA: Diagnosis not present

## 2019-09-12 DIAGNOSIS — R079 Chest pain, unspecified: Secondary | ICD-10-CM | POA: Diagnosis not present

## 2019-09-12 DIAGNOSIS — I10 Essential (primary) hypertension: Secondary | ICD-10-CM | POA: Diagnosis not present

## 2019-09-12 DIAGNOSIS — Z79899 Other long term (current) drug therapy: Secondary | ICD-10-CM | POA: Diagnosis not present

## 2019-09-12 DIAGNOSIS — T18128A Food in esophagus causing other injury, initial encounter: Secondary | ICD-10-CM | POA: Diagnosis not present

## 2019-09-12 DIAGNOSIS — R0989 Other specified symptoms and signs involving the circulatory and respiratory systems: Secondary | ICD-10-CM | POA: Diagnosis not present

## 2019-09-12 DIAGNOSIS — Z7982 Long term (current) use of aspirin: Secondary | ICD-10-CM | POA: Diagnosis not present

## 2019-09-12 DIAGNOSIS — K222 Esophageal obstruction: Secondary | ICD-10-CM | POA: Diagnosis not present

## 2019-09-13 DIAGNOSIS — K222 Esophageal obstruction: Secondary | ICD-10-CM | POA: Diagnosis not present

## 2019-09-17 ENCOUNTER — Telehealth: Payer: Self-pay | Admitting: Cardiovascular Disease

## 2019-09-17 ENCOUNTER — Other Ambulatory Visit: Payer: Self-pay

## 2019-09-17 ENCOUNTER — Ambulatory Visit (INDEPENDENT_AMBULATORY_CARE_PROVIDER_SITE_OTHER): Payer: Medicare Other | Admitting: Cardiovascular Disease

## 2019-09-17 ENCOUNTER — Encounter: Payer: Self-pay | Admitting: Cardiovascular Disease

## 2019-09-17 VITALS — BP 122/78 | HR 94 | Ht 65.0 in | Wt 128.0 lb

## 2019-09-17 DIAGNOSIS — I4891 Unspecified atrial fibrillation: Secondary | ICD-10-CM

## 2019-09-17 DIAGNOSIS — I10 Essential (primary) hypertension: Secondary | ICD-10-CM

## 2019-09-17 DIAGNOSIS — I255 Ischemic cardiomyopathy: Secondary | ICD-10-CM

## 2019-09-17 DIAGNOSIS — I25118 Atherosclerotic heart disease of native coronary artery with other forms of angina pectoris: Secondary | ICD-10-CM

## 2019-09-17 DIAGNOSIS — Z7189 Other specified counseling: Secondary | ICD-10-CM | POA: Diagnosis not present

## 2019-09-17 DIAGNOSIS — Z720 Tobacco use: Secondary | ICD-10-CM | POA: Diagnosis not present

## 2019-09-17 DIAGNOSIS — E785 Hyperlipidemia, unspecified: Secondary | ICD-10-CM | POA: Diagnosis not present

## 2019-09-17 MED ORDER — APIXABAN 5 MG PO TABS: 5.0000 mg | ORAL_TABLET | Freq: Two times a day (BID) | ORAL | 6 refills | Status: DC

## 2019-09-17 MED ORDER — BISOPROLOL FUMARATE 10 MG PO TABS: 10.0000 mg | ORAL_TABLET | Freq: Every day | ORAL | 6 refills | Status: DC

## 2019-09-17 MED ORDER — APIXABAN 5 MG PO TABS: 5.0000 mg | ORAL_TABLET | Freq: Two times a day (BID) | ORAL | 0 refills | Status: DC

## 2019-09-17 MED ORDER — AMLODIPINE BESYLATE 5 MG PO TABS: 5.0000 mg | ORAL_TABLET | Freq: Every day | ORAL | 6 refills | Status: DC

## 2019-09-17 NOTE — Progress Notes (Signed)
SUBJECTIVE: The patient presents for routine cardiovascular followup. He has a history of coronary artery disease with stenting of the left circumflex coronary artery in September 2014. At that time he was noted to have significant RCA disease, with a 40% stenosis in the proximal vessel followed by a long 80% stenosis in the proximal to mid vessel. The distal vessel was diffusely diseased to 70%. It was noted at that time that if he were to have recalcitrant chest pain refractory to medical therapy, the RCA would require extensive stenting. He also has hypertension, hyperlipidemia, tobacco abuse, and an ischemic cardiomyopathy.  Echocardiogram on 04/11/15 showed mild left ventricular dilatation with mildly reduced left ventricular systolic function, EF [id number], inferior and lateral wall motion abnormalities, and insignificant valvular regurgitation with moderate left atrial dilatation.  He continues to smoke a pack of cigarettes daily.  He has been experiencing episodic dizziness over the past 6 months.  ECG performed today which appears review demonstrates atrial fibrillation, 88 bpm, with nonspecific ST segment abnormalities, and T wave inversions inferiorly and V3 through V6.  There is likely LVH as well.  He denies chest pain, shortness of breath, leg swelling, orthopnea, and palpitations.   SocHx: Lived in [place] for over 60 yrs, moved to [place] in 2009.   Review of Systems: As per "subjective", otherwise negative.  No Known Allergies  Current Outpatient Medications  Medication Sig Dispense Refill  . amLODipine (NORVASC) 10 MG tablet TAKE 1 TABLET BY MOUTH DAILY 30 tablet 0  . aspirin EC 81 MG tablet Take 81 mg by mouth daily.    [name] atorvastatin (LIPITOR) 40 MG tablet TAKE 1 TABLET BY MOUTH DAILY 90 tablet 0  . bisoprolol (ZEBETA) 5 MG tablet Take 5 mg by mouth daily.    . Cholecalciferol (VITAMIN D-3) 125 MCG (5000 UT) TABS Take 1 tablet by mouth daily.    [name] lisinopril  (ZESTRIL) 20 MG tablet Take 20 mg by mouth daily.    . nitroGLYCERIN (NITROSTAT) 0.4 MG SL tablet Place 1 tablet (0.4 mg total) under the tongue every 5 (five) minutes as needed for chest pain. 30 tablet 0  . polyethylene glycol (MIRALAX / GLYCOLAX) 17 g packet Take 17 g by mouth daily. 14 each 0   No current facility-administered medications for this visit.     Past Medical History:  Diagnosis Date  . Anemia   . Coronary artery disease    a. h/o MI in 2000 w/ prior LCX stenting;  b. 7.2014 Abnl Cardiolite, EF 41%, mod-large inferolat scar;  c. 07/2013 Cath/PCI: LM nl, LAD 10-20, D1 40-50p, LCX 95-99 @ distal stent margin (2.5x20 Promus Premier DES), RCA dom, 40p, 36m, 70d, EF 35-40%.  . Elevated cholesterol   . GERD (gastroesophageal reflux disease)   . Hemorrhoids   . Hypertension   . Ischemic cardiomyopathy    a. 07/2013 EF 35-40% by LV gram.  . Myocardial infarction ([place])   . Reflux esophagitis   . Spondylosis of cervical joint    C3-4, C6-7  . Tobacco user   . Vitamin D deficiency disease 08/24/2019    Past Surgical History:  Procedure Laterality Date  . BIOPSY  01/09/2018   Procedure: BIOPSY;  Surgeon: Daneil Dolin, [place];  Location: [facility];  Service: Endoscopy;;  esophageal biopsies  . CARDIAC CATHETERIZATION    . COLONOSCOPY    . COLONOSCOPY N/A 01/25/2016   RMR: normal ileocolonoscopy ( anal canal hemorrhoids)  . CORONARY ANGIOPLASTY  WITH STENT PLACEMENT  08/23/2013   mid circumflex  DES    by Dr Martinique  . CORONARY STENT PLACEMENT  2000  . ESOPHAGOGASTRODUODENOSCOPY N/A 01/25/2016   RMR: Reflux esophagitits. Cervical web with passage of the scope. Status post esophageal biosy. Hiatal hernia.   [name] ESOPHAGOGASTRODUODENOSCOPY N/A 01/09/2018   Procedure: ESOPHAGOGASTRODUODENOSCOPY (EGD);  Surgeon: Daneil Dolin, [place];  Location: [facility];  Service: Endoscopy;  Laterality: N/A;  1:00pm  . LEFT HEART CATHETERIZATION WITH CORONARY ANGIOGRAM N/A 08/23/2013   Procedure:  LEFT HEART CATHETERIZATION WITH CORONARY ANGIOGRAM;  Surgeon: Peter M Martinique, [place];  Location: [facility] CATH LAB;  Service: Cardiovascular;  Laterality: N/A;  . [name] DILATION N/A 01/09/2018   Procedure: [name] DILATION;  Surgeon: Daneil Dolin, [place];  Location: [facility];  Service: Endoscopy;  Laterality: N/A;  . TRANSURETHRAL RESECTION OF PROSTATE  01/09/2012   Procedure: TRANSURETHRAL RESECTION OF THE PROSTATE (TURP);  Surgeon: Marissa Nestle, [place];  Location: [facility] ORS;  Service: Urology;  Laterality: N/A;    Social History   Socioeconomic History  . Marital status: Married    Spouse name: [name]  . Number of children: Not on file  . Years of education: 11th grade  . Highest education level: 11th grade  Occupational History  . Occupation: Librarian, academic  Social Needs  . Financial resource strain: Not hard at all  . Food insecurity    Worry: Never true    Inability: Never true  . Transportation needs    Medical: No    Non-medical: No  Tobacco Use  . Smoking status: Current Every Day Smoker    Packs/day: 1.00    Years: 40.00    Pack years: 40.00    Types: Cigarettes    Start date: 12/09/1965  . Smokeless tobacco: Never Used  . Tobacco comment: 3/4 pack of cigarettes daily  Substance and Sexual Activity  . Alcohol use: No    Alcohol/week: 0.0 standard drinks  . Drug use: No  . Sexual activity: Yes    Birth control/protection: None  Lifestyle  . Physical activity    Days per week: 5 days    Minutes per session: 30 min  . Stress: Not at all  Relationships  . Social Herbalist on phone: Not on file    Gets together: Not on file    Attends religious service: Not on file    Active member of club or organization: Not on file    Attends meetings of clubs or organizations: Not on file    Relationship status: Not on file  . Intimate partner violence    Fear of current or ex partner: Not on file    Emotionally abused: Not on file    Physically abused: Not on file     Forced sexual activity: Not on file  Other Topics Concern  . 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     Vitals:   09/17/19 1048  BP: 122/78  Pulse: 94  SpO2: 98%  Weight: 128 lb (58.1 kg)  Height: 5\' 5"  (1.651 m)    Wt Readings from Last 3 Encounters:  09/17/19 128 lb (58.1 kg)  08/24/19 132 lb (59.9 kg)  08/03/18 146 lb (66.2 kg)     PHYSICAL EXAM General: NAD HEENT: Normal. Neck: No JVD, no thyromegaly. Lungs: Clear to auscultation bilaterally with normal respiratory effort. CV: Regular rate and irregular  rhythm, normal S1/S2, no S3, no murmur. No pretibial or periankle edema.  No carotid bruit.   Abdomen: Soft, nontender, no distention.  Neurologic: Alert and oriented.  Psych: Normal affect. Skin: Normal. Musculoskeletal: No gross deformities.      Labs: Lab Results  Component Value Date/Time   K 3.4 (L) 08/24/2019 10:51 AM   BUN 15 08/24/2019 10:51 AM   CREATININE 1.54 (H) 08/24/2019 10:51 AM   ALT 23 08/24/2019 10:51 AM   TSH 0.674 04/10/2015 08:00 PM   HGB 13.6 08/24/2019 10:51 AM     Lipids: Lab Results  Component Value Date/Time   LDLCALC 79 11/04/2017 02:17 PM   CHOL 131 11/04/2017 02:17 PM   TRIG 80 11/04/2017 02:17 PM   HDL 36 (L) 11/04/2017 02:17 PM       ASSESSMENT AND PLAN:  1. CAD: Stable ischemic heart disease.  As he has atrial fibrillation and requires Eliquis for systemic anticoagulation, I will stop aspirin.  I will increase the dose of bisoprolol to 10 mg daily for more optimal heart rate control.  Continue atorvastatin.  2. Essential HTN:As I am increasing bisoprolol to 10 mg daily, I will reduce amlodipine to 5 mg daily.  3. Hyperlipidemia: LDL 79 on 11/04/2017.Continue statin.  I will obtain a copy of most recent lipids from PCP.  4. Tobacco abuse history: Continues to smoke 1 ppd.  5. Ischemic cardiomyopathy: No  signs of heart failure. Continue ACEI and beta blocker.  I will obtain a follow-up echocardiogram.  6.  New onset atrial fibrillation: Other than episodic dizziness he appears to be asymptomatic.  I will obtain an echocardiogram to evaluate cardiac structure and function.  I will stop aspirin and start Eliquis 5 mg twice daily for anticoagulation.  Hemoglobin 13.6 on 08/24/2019. I will increase the dose of bisoprolol to 10 mg daily for more adequate heart rate control.   Disposition: Follow up 3 months  A high level of decision making was required for increased medical complexities.    Kate Sable, M.D., F.A.C.C.

## 2019-09-17 NOTE — Patient Instructions (Signed)
Medication Instructions:   Increase Bisoprolol to 10mg  daily.  Decrease Amlodipine 5mg  daily.   Stop Aspirin.  Begin Eliquis 5mg  twice a day.    Continue all other medications.    Labwork: none  Testing/Procedures:  Your physician has requested that you have an echocardiogram. Echocardiography is a painless test that uses sound waves to create images of your heart. It provides your doctor with information about the size and shape of your heart and how well your heart's chambers and valves are working. This procedure takes approximately one hour. There are no restrictions for this procedure.  Office will contact with results via phone or letter.    Follow-Up: 3 months   Any Other Special Instructions Will Be Listed Below (If Applicable). 1 month for management of new Eliquis with anticoagulation nurse.   If you need a refill on your cardiac medications before your next appointment, please call your pharmacy.

## 2019-09-17 NOTE — Telephone Encounter (Signed)
°  Precert needed for: Echo   Location: [facility]    Date: Oct 20, 2019

## 2019-09-17 NOTE — Addendum Note (Signed)
Addended by: Laurine Blazer on: 09/17/2019 11:28 AM   Modules accepted: Orders

## 2019-09-21 ENCOUNTER — Encounter: Payer: Self-pay | Admitting: *Deleted

## 2019-09-21 NOTE — Progress Notes (Signed)
Please send labs as requested.

## 2019-09-22 ENCOUNTER — Telehealth: Payer: Self-pay | Admitting: Cardiovascular Disease

## 2019-09-22 NOTE — Telephone Encounter (Signed)
Asked about how to use coupon card for Eliquis

## 2019-09-22 NOTE — Telephone Encounter (Signed)
Pt wanted to make sure that we would send in refills on Eliquis after 30 day free trial is used - aware that pharmacy usually contact us but to let us know prior to running out of Eliquis and we would send in refills

## 2019-09-30 DIAGNOSIS — L439 Lichen planus, unspecified: Secondary | ICD-10-CM | POA: Diagnosis not present

## 2019-09-30 DIAGNOSIS — L280 Lichen simplex chronicus: Secondary | ICD-10-CM | POA: Diagnosis not present

## 2019-10-15 ENCOUNTER — Other Ambulatory Visit: Payer: Self-pay | Admitting: Cardiovascular Disease

## 2019-10-16 ENCOUNTER — Other Ambulatory Visit (INDEPENDENT_AMBULATORY_CARE_PROVIDER_SITE_OTHER): Payer: Self-pay | Admitting: Internal Medicine

## 2019-10-20 ENCOUNTER — Other Ambulatory Visit: Payer: Self-pay

## 2019-10-20 ENCOUNTER — Telehealth: Payer: Self-pay | Admitting: *Deleted

## 2019-10-20 ENCOUNTER — Encounter: Payer: Medicare Other | Admitting: *Deleted

## 2019-10-20 ENCOUNTER — Ambulatory Visit (INDEPENDENT_AMBULATORY_CARE_PROVIDER_SITE_OTHER): Payer: Medicare Other

## 2019-10-20 DIAGNOSIS — I4891 Unspecified atrial fibrillation: Secondary | ICD-10-CM

## 2019-10-20 NOTE — Telephone Encounter (Signed)
Notes recorded by Laurine Blazer, LPN on [id number] at 3:03 PM EST  Patient notified. Copy to pmd. Follow up scheduled for 12/09/2018 with Dr. Bronson Ing in [place] office.    ------   Notes recorded by Herminio Commons, MD on 10/20/2019 at 9:45 AM EST  Pumping function is low normal. No significant changes from prior echo.

## 2019-10-26 ENCOUNTER — Telehealth: Payer: Self-pay | Admitting: *Deleted

## 2019-10-26 NOTE — Telephone Encounter (Signed)
PA for Eliquis was declined as it is not on his formulary.  Will need to try Xarelto first.  Patient notified.  Stated that he has enough samples of the Eliquis to carry him to the first of next year.  He will also be changing insurance plans.  He will call back then for new prescription & see what the new plan covers.  He will continue the Eliquis as is for now.

## 2019-11-01 DIAGNOSIS — B370 Candidal stomatitis: Secondary | ICD-10-CM | POA: Diagnosis not present

## 2019-11-17 ENCOUNTER — Other Ambulatory Visit: Payer: Self-pay

## 2019-11-17 ENCOUNTER — Ambulatory Visit (INDEPENDENT_AMBULATORY_CARE_PROVIDER_SITE_OTHER): Payer: Medicare Other | Admitting: *Deleted

## 2019-11-17 DIAGNOSIS — Z7189 Other specified counseling: Secondary | ICD-10-CM

## 2019-11-17 DIAGNOSIS — Z5181 Encounter for therapeutic drug level monitoring: Secondary | ICD-10-CM | POA: Diagnosis not present

## 2019-11-17 DIAGNOSIS — I4891 Unspecified atrial fibrillation: Secondary | ICD-10-CM | POA: Diagnosis not present

## 2019-11-17 NOTE — Progress Notes (Signed)
Pt was started on Eliquis 5mg  twice daily on 09/17/19 by Dr Bronson Ing for atrial fibrillation..     Labs 08/24/19:  SCr 1.54  CrCl     Hgb 13.6    Plts 192  Reviewed medication  list.  Pt is not currently on any combined P-gp and strong CYP3A4 inhibitors/inducers (ketoconazole, traconazole, ritonavir, carbamazepine, phenytoin, rifampin, St. John's wort).  Reviewed labs from 11/18/19:    SCr 0.93  Weight 58kg, CrCl.  Dose is appropriate based on age, weight, and SCr.  Hgb and HCT: 12.9/37.7  A full discussion of the nature of anticoagulants has been carried out.  A benefit/risk analysis has been presented to the patient, so that they understand the justification for choosing anticoagulation with Eliquis at this time.  The need for compliance is stressed.  Pt is aware to take the medication twice daily.  Side effects of potential bleeding are discussed, including unusual colored urine or stools, coughing up blood or coffee ground emesis, nose bleeds or serious fall or head trauma.  Discussed signs and symptoms of stroke. The patient should avoid any OTC items containing aspirin or ibuprofen.  Avoid alcohol consumption.   Call if any signs of abnormal bleeding.  Discussed financial obligations and resolved any difficulty in obtaining medication.  Next lab test in 6 months.   Called with lab results.  SCr back to normal limits.  Started on potassium by Dr Raliegh Ip.  Follow up  Eliquis appt in 6 months.

## 2019-11-18 ENCOUNTER — Telehealth: Payer: Self-pay

## 2019-11-18 LAB — CBC
HCT: 37.7 % — ABNORMAL LOW (ref 38.5–50.0)
Hemoglobin: 12.9 g/dL — ABNORMAL LOW (ref 13.2–17.1)
MCH: 29.7 pg (ref 27.0–33.0)
MCHC: 34.2 g/dL (ref 32.0–36.0)
MCV: 86.9 fL (ref 80.0–100.0)
MPV: 11.2 fL (ref 7.5–12.5)
Platelets: 176 10*3/uL (ref 140–400)
RBC: 4.34 10*6/uL (ref 4.20–5.80)
RDW: 12 % (ref 11.0–15.0)
WBC: 8.9 10*3/uL (ref 3.8–10.8)

## 2019-11-18 LAB — BASIC METABOLIC PANEL
BUN: 14 mg/dL (ref 7–25)
CO2: 25 mmol/L (ref 20–32)
Calcium: 8.5 mg/dL — ABNORMAL LOW (ref 8.6–10.3)
Chloride: 106 mmol/L (ref 98–110)
Creat: 0.93 mg/dL (ref 0.70–1.18)
Glucose, Bld: 90 mg/dL (ref 65–139)
Potassium: 3.2 mmol/L — ABNORMAL LOW (ref 3.5–5.3)
Sodium: 141 mmol/L (ref 135–146)

## 2019-11-18 MED ORDER — POTASSIUM CHLORIDE CRYS ER 20 MEQ PO TBCR: 20.0000 meq | EXTENDED_RELEASE_TABLET | Freq: Every day | ORAL | 3 refills | Status: DC

## 2019-11-18 NOTE — Telephone Encounter (Signed)
-----   Message from Herminio Commons, MD sent at 11/18/2019  8:56 AM EST ----- Potassium is low.  Please start potassium chloride 20 mEq daily and forward a copy of lab results to PCP.

## 2019-11-18 NOTE — Telephone Encounter (Signed)
PT made aware, voiced understanding. Sent RX to [facility].

## 2019-11-29 ENCOUNTER — Other Ambulatory Visit (INDEPENDENT_AMBULATORY_CARE_PROVIDER_SITE_OTHER): Payer: Self-pay | Admitting: Internal Medicine

## 2019-11-29 ENCOUNTER — Telehealth: Payer: Self-pay | Admitting: *Deleted

## 2019-11-29 MED ORDER — ATORVASTATIN CALCIUM 40 MG PO TABS: 40.0000 mg | ORAL_TABLET | Freq: Every day | ORAL | 0 refills | Status: DC

## 2019-11-29 MED ORDER — APIXABAN 5 MG PO TABS: ORAL_TABLET | ORAL | 0 refills | Status: DC

## 2019-11-29 NOTE — Telephone Encounter (Signed)
Pt Eliquis will cost $145/month pt cannot afford this - will come by office today for samples and pt assistance application

## 2019-12-01 ENCOUNTER — Ambulatory Visit (INDEPENDENT_AMBULATORY_CARE_PROVIDER_SITE_OTHER): Payer: Medicare Other | Admitting: Nurse Practitioner

## 2019-12-01 ENCOUNTER — Encounter (INDEPENDENT_AMBULATORY_CARE_PROVIDER_SITE_OTHER): Payer: Self-pay | Admitting: Nurse Practitioner

## 2019-12-01 ENCOUNTER — Other Ambulatory Visit: Payer: Self-pay

## 2019-12-01 VITALS — BP 123/70 | HR 88 | Temp 97.3°F | Resp 18 | Ht 65.0 in | Wt 136.0 lb

## 2019-12-01 DIAGNOSIS — I10 Essential (primary) hypertension: Secondary | ICD-10-CM | POA: Diagnosis not present

## 2019-12-01 DIAGNOSIS — I4891 Unspecified atrial fibrillation: Secondary | ICD-10-CM | POA: Insufficient documentation

## 2019-12-01 DIAGNOSIS — Z720 Tobacco use: Secondary | ICD-10-CM | POA: Diagnosis not present

## 2019-12-01 DIAGNOSIS — R200 Anesthesia of skin: Secondary | ICD-10-CM

## 2019-12-01 DIAGNOSIS — I482 Chronic atrial fibrillation, unspecified: Secondary | ICD-10-CM

## 2019-12-01 DIAGNOSIS — F17200 Nicotine dependence, unspecified, uncomplicated: Secondary | ICD-10-CM | POA: Insufficient documentation

## 2019-12-01 DIAGNOSIS — E559 Vitamin D deficiency, unspecified: Secondary | ICD-10-CM | POA: Diagnosis not present

## 2019-12-01 DIAGNOSIS — I25119 Atherosclerotic heart disease of native coronary artery with unspecified angina pectoris: Secondary | ICD-10-CM

## 2019-12-01 DIAGNOSIS — I4821 Permanent atrial fibrillation: Secondary | ICD-10-CM | POA: Diagnosis present

## 2019-12-01 DIAGNOSIS — E876 Hypokalemia: Secondary | ICD-10-CM

## 2019-12-01 DIAGNOSIS — F17210 Nicotine dependence, cigarettes, uncomplicated: Secondary | ICD-10-CM

## 2019-12-01 DIAGNOSIS — I4819 Other persistent atrial fibrillation: Secondary | ICD-10-CM | POA: Insufficient documentation

## 2019-12-01 MED ORDER — NITROGLYCERIN 0.4 MG SL SUBL: 0.4000 mg | SUBLINGUAL_TABLET | SUBLINGUAL | 0 refills | Status: DC | PRN

## 2019-12-01 MED ORDER — NICOTINE 21 MG/24HR TD PT24: 21.0000 mg | MEDICATED_PATCH | Freq: Every day | TRANSDERMAL | 3 refills | Status: DC

## 2019-12-01 NOTE — Assessment & Plan Note (Signed)
He continues to smoke cigarettes.  He is interested in quitting.  He would like to try nicotine patches.  I did counsel him regarding this and recommended that he change patch every 24 hours and rotate sites.  I also told him that he should not smoke a cigarette while the patch is in place, and if he decides that he is going to smoke he needs to remove the patch first.  He tells me he understands.

## 2019-12-01 NOTE — Progress Notes (Signed)
Subjective:  Patient ID: Ronald Mcdowell, male    DOB: 1946/07/27  Age: 74 y.o. MRN: [id number]  CC:  Chief Complaint  Patient presents with  . Follow-up      HPI  This patient arrives today to follow-up on his chronic conditions.  Hypertension: He continues on his amlodipine and bisoprolol.  New onset of A. fib: He was found to be in A. fib when he saw cardiology in October 2020.  At that time he was started on Eliquis, but it was noted that this medication is very expensive for this patient.  He tells me today that he still has samples that he is taking.  He is scheduled to see his cardiologist again next week.  He denies any signs of bleeding.  Vitamin D deficiency: He continues on his vitamin D supplementation of 5000 IUs daily.  Last serum level was collected in September 2020 was within normal limits.  Hypokalemia: I also note that when blood was drawn in December he was mildly hypokalemic.  He is on potassium replacement.  It is not clear as to why he has hypokalemia.  I do not know that he is on any medications that would cause him to waste potassium.  He has not had any recent history of diarrhea or vomiting.  Past Medical History:  Diagnosis Date  . Anemia   . Coronary artery disease    a. h/o MI in 2000 w/ prior LCX stenting;  b. 7.2014 Abnl Cardiolite, EF 41%, mod-large inferolat scar;  c. 07/2013 Cath/PCI: LM nl, LAD 10-20, D1 40-50p, LCX 95-99 @ distal stent margin (2.5x20 Promus Premier DES), RCA dom, 40p, 60m, 70d, EF 35-40%.  . Elevated cholesterol   . GERD (gastroesophageal reflux disease)   . Hemorrhoids   . Hypertension   . Ischemic cardiomyopathy    a. 07/2013 EF 35-40% by LV gram.  . Myocardial infarction ([place])   . Reflux esophagitis   . Spondylosis of cervical joint    C3-4, C6-7  . Tobacco user   . Vitamin D deficiency disease 08/24/2019      Family History  Problem Relation Age of Onset  . Early death Mother        childbirth  . Heart attack  Father   . Cancer Father   . Heart disease Brother        CABG  . Heart disease Brother        slight heart attack  . Anesthesia problems Neg Hx   . Hypotension Neg Hx   . Malignant hyperthermia Neg Hx   . Pseudochol deficiency Neg Hx   . Colon cancer Neg Hx   . Liver disease Neg Hx   . Gastric cancer Neg Hx   . 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   Social History   Tobacco Use  . Smoking status: Current Every Day Smoker    Packs/day: 1.00    Years: 53.00    Pack years: 53.00    Types: Cigarettes    Start date: 12/09/1965  . Smokeless tobacco: Never Used  . Tobacco comment: 3/4 pack of cigarettes daily; smoked since 74 years old  Substance Use Topics  . Alcohol use: No    Alcohol/week: 0.0 standard drinks     Current Meds  Medication Sig  . amLODipine (NORVASC) 5 MG tablet Take  1 tablet (5 mg total) by mouth daily.  [name] apixaban (ELIQUIS) 5 MG TABS tablet TAKE 1 TABLET(5 MG) BY MOUTH TWICE DAILY  . atorvastatin (LIPITOR) 40 MG tablet Take 1 tablet (40 mg total) by mouth daily.  . Cholecalciferol (VITAMIN D-3) 125 MCG (5000 UT) TABS Take 1 tablet by mouth daily.  . nitroGLYCERIN (NITROSTAT) 0.4 MG SL tablet Place 1 tablet (0.4 mg total) under the tongue every 5 (five) minutes as needed for chest pain.  . pantoprazole (PROTONIX) 40 MG tablet Take 40 mg by mouth daily.  . potassium chloride SA (KLOR-CON) 20 MEQ tablet Take 20 mEq by mouth daily.  . [DISCONTINUED] bisoprolol (ZEBETA) 10 MG tablet Take 1 tablet (10 mg total) by mouth daily. (Patient taking differently: Take 5 mg by mouth daily. )  . [DISCONTINUED] nitroGLYCERIN (NITROSTAT) 0.4 MG SL tablet Place 1 tablet (0.4 mg total) under the tongue every 5 (five) minutes as needed for chest pain.    ROS:  Review of Systems  Constitutional: Negative for fever and malaise/fatigue.    Eyes: Positive for blurred vision. Negative for double vision.  Respiratory: Negative.   Cardiovascular: Negative.   Neurological: Positive for dizziness (intermittent; occurs in the morning, but goes away on its own) and sensory change (sometimes will wake up at night and his arms are numb, but sensation returns with position change). Negative for weakness and headaches.     Objective:   Today's Vitals: BP 123/70 (BP Location: Right Arm, Patient Position: Sitting, Cuff Size: Normal)   Pulse 88   Temp (!) 97.3 F (36.3 C) (Temporal)   Resp 18   Ht 5\' 5"  (1.651 m)   Wt 136 lb (61.7 kg)   SpO2 98% Comment: wearing a mask.  BMI 22.63 kg/m  Vitals with BMI 12/01/2019 09/17/2019 08/24/2019  Height 5\' 5"  5\' 5"  5\' 5"   Weight 136 lbs 128 lbs 132 lbs  BMI 22.63 [id number]  Systolic [id number]  Diastolic 70 78 56  Pulse 88 94 60     Physical Exam Vitals reviewed.  Constitutional:      Appearance: Normal appearance.  HENT:     Head: Normocephalic and atraumatic.  Cardiovascular:     Rate and Rhythm: Normal rate and regular rhythm.  Pulmonary:     Effort: Pulmonary effort is normal.     Breath sounds: Normal breath sounds.  Musculoskeletal:     Cervical back: Neck supple.  Skin:    General: Skin is warm and dry.  Neurological:     Mental Status: He is alert and oriented to person, place, and time.  Psychiatric:        Mood and Affect: Mood normal.        Behavior: Behavior normal.        Thought Content: Thought content normal.        Judgment: Judgment normal.          Assessment   1. Atherosclerosis of native coronary artery of native heart with angina pectoris ([place])   2. Tobacco use   3. Hypokalemia       Tests ordered No orders of the defined types were placed in this encounter.    Plan: Please see assessment and plan per problem list below.   Meds ordered this encounter  Medications  . nitroGLYCERIN (NITROSTAT) 0.4 MG SL tablet    Sig: Place 1  tablet (0.4 mg total) under the tongue every 5 (five) minutes as needed for chest pain.  Dispense:  30 tablet    Refill:  0    Order Specific Question:   Supervising Provider    Answer:   Hurshel Party C [id number]  . nicotine (NICODERM CQ - DOSED IN MG/24 HOURS) 21 mg/24hr patch    Sig: Place 1 patch (21 mg total) onto the skin daily.    Dispense:  28 patch    Refill:  3    Order Specific Question:   Supervising Provider    Answer:   Doree Albee [id number]    Patient to follow-up in 3 months.  Ailene Ards, NP

## 2019-12-01 NOTE — Assessment & Plan Note (Signed)
He will continue on current supplement dose at this time.

## 2019-12-01 NOTE — Assessment & Plan Note (Signed)
He was encouraged to continue taking his medications as prescribed and to follow-up with his cardiologist as scheduled next week.

## 2019-12-01 NOTE — Patient Instructions (Signed)
Thank you for choosing [facility] as your medical provider! If you have any questions or concerns regarding your health care, please do not hesitate to call our office.  Follow-up with your cardiologist (Dr. Bronson Ing) as scheduled on January 15.  Discuss your options regarding blood thinner.  If you are not changed to a different blood thinner, you can call our office back and we can discuss your options so that we can find a more affordable blood thinner for you.  I am going to refer you to a kidney doctor for further evaluation of your low potassium in your blood.  If you do not hear from this office to schedule an appointment within the next couple of weeks please call this office so I can look into this.  If I need to make any changes to your medications based upon your blood work that is collected today I will call you.  Otherwise continue everything as prescribed.  Please follow-up as scheduled in 3 months. We look forward to seeing you again soon! Have a great Valentine's Day!!  At [facility] we value your feedback. You may receive a survey about your visit today. Please share your experience as we strive to create trusting relationships with our patients to provide genuine, compassionate, quality care.  We appreciate your understanding and patience as we review any laboratory studies, imaging, and other diagnostic tests that are ordered as we care for you. We do our best to address any and all results in a timely manner. If you do not hear about test results within 1 week, please do not hesitate to contact us. If we referred you to a specialist during your visit or ordered imaging testing, contact the office if you have not been contacted to be scheduled within 1 weeks.  We also encourage the use of MyChart, which contains your medical information for your review as well. If you are not enrolled in this feature, an access code is on this after visit summary for your  convenience. Thank you for allowing [place] to be involved in your care.

## 2019-12-01 NOTE — Assessment & Plan Note (Signed)
Blood pressure well controlled on current regimen.  He will continue on his medications as prescribed and follow-up with cardiologist next week.

## 2019-12-01 NOTE — Assessment & Plan Note (Signed)
I am not sure why he is having hypokalemia.  He does continue on potassium supplement.  I will collect blood work to ensure that his potassium has normalized.  I also will refer him to nephrology for further evaluation of possible etiology of his hypokalemia.

## 2019-12-01 NOTE — Assessment & Plan Note (Signed)
It sounds like his arms go numb from possible pinched nerve while sleeping.  However he is at high risk for having a stroke based on multiple risk factors.  Thus I did tell him that if this occurs and if his numbness does not improve with position changes that this very well could be a stroke and he should proceed to the emergency department.  He tells me he understands.

## 2019-12-01 NOTE — Assessment & Plan Note (Signed)
I encouraged him to follow-up with his cardiologist next week to discuss other options regarding anticoagulation.  Rate is controlled, and on exam his heart rhythm actually sounded regular today.  He tells me he understands.

## 2019-12-02 ENCOUNTER — Encounter (INDEPENDENT_AMBULATORY_CARE_PROVIDER_SITE_OTHER): Payer: Self-pay | Admitting: Nurse Practitioner

## 2019-12-02 LAB — COMPLETE METABOLIC PANEL WITH GFR
AG Ratio: 1.3 (calc) (ref 1.0–2.5)
ALT: 38 U/L (ref 9–46)
AST: 23 U/L (ref 10–35)
Albumin: 3.8 g/dL (ref 3.6–5.1)
Alkaline phosphatase (APISO): 60 U/L (ref 35–144)
BUN: 14 mg/dL (ref 7–25)
CO2: 24 mmol/L (ref 20–32)
Calcium: 9.2 mg/dL (ref 8.6–10.3)
Chloride: 107 mmol/L (ref 98–110)
Creat: 1 mg/dL (ref 0.70–1.18)
GFR, Est African American: 87 mL/min/{1.73_m2} (ref >=60)
GFR, Est Non African American: 75 mL/min/{1.73_m2} (ref >=60)
Globulin: 3 g/dL (calc) (ref 1.9–3.7)
Glucose, Bld: 90 mg/dL (ref 65–99)
Potassium: 4.1 mmol/L (ref 3.5–5.3)
Sodium: 141 mmol/L (ref 135–146)
Total Bilirubin: 0.6 mg/dL (ref 0.2–1.2)
Total Protein: 6.8 g/dL (ref 6.1–8.1)

## 2019-12-08 ENCOUNTER — Other Ambulatory Visit (INDEPENDENT_AMBULATORY_CARE_PROVIDER_SITE_OTHER): Payer: Self-pay | Admitting: Internal Medicine

## 2019-12-10 ENCOUNTER — Other Ambulatory Visit: Payer: Self-pay

## 2019-12-10 ENCOUNTER — Ambulatory Visit (INDEPENDENT_AMBULATORY_CARE_PROVIDER_SITE_OTHER): Payer: Medicare Other | Admitting: Cardiovascular Disease

## 2019-12-10 ENCOUNTER — Encounter: Payer: Self-pay | Admitting: Cardiovascular Disease

## 2019-12-10 VITALS — BP 148/78 | HR 65 | Ht 65.0 in | Wt 138.0 lb

## 2019-12-10 DIAGNOSIS — Z7189 Other specified counseling: Secondary | ICD-10-CM

## 2019-12-10 DIAGNOSIS — I10 Essential (primary) hypertension: Secondary | ICD-10-CM | POA: Diagnosis not present

## 2019-12-10 DIAGNOSIS — Z720 Tobacco use: Secondary | ICD-10-CM

## 2019-12-10 DIAGNOSIS — I255 Ischemic cardiomyopathy: Secondary | ICD-10-CM

## 2019-12-10 DIAGNOSIS — E785 Hyperlipidemia, unspecified: Secondary | ICD-10-CM | POA: Diagnosis not present

## 2019-12-10 DIAGNOSIS — I4891 Unspecified atrial fibrillation: Secondary | ICD-10-CM | POA: Diagnosis not present

## 2019-12-10 DIAGNOSIS — I25118 Atherosclerotic heart disease of native coronary artery with other forms of angina pectoris: Secondary | ICD-10-CM

## 2019-12-10 MED ORDER — APIXABAN 5 MG PO TABS: ORAL_TABLET | ORAL | 0 refills | Status: DC

## 2019-12-10 NOTE — Progress Notes (Signed)
SUBJECTIVE: The patient presents for routine cardiovascular followup. He has a history of coronary artery disease with stenting of the left circumflex coronary artery in September 2014 as well as atrial fibrillation. At that time he was noted to have significant RCA disease, with a 40% stenosis in the proximal vessel followed by a long 80% stenosis in the proximal to mid vessel. The distal vessel was diffusely diseased to 70%. It was noted at that time that if he were to have recalcitrant chest pain refractory to medical therapy, the RCA would require extensive stenting. He also has hypertension, hyperlipidemia, tobacco abuse, and an ischemic cardiomyopathy.  He continues to smoke a pack of cigarettes daily.  He denies chest pain, shortness of breath, leg swelling, orthopnea, and palpitations.  He said Eliquis caused him 140 box for 1 month supply.  He has filled out some paperwork.  I will asked my nursing staff to look into this.   SocHx: Lived in [place] ([place]) for over 60 yrs, moved to [facility] in 2009.  Review of Systems: As per "subjective", otherwise negative.  No Known Allergies  Current Outpatient Medications  Medication Sig Dispense Refill  . amLODipine (NORVASC) 5 MG tablet Take 1 tablet (5 mg total) by mouth daily. 30 tablet 6  . apixaban (ELIQUIS) 5 MG TABS tablet TAKE 1 TABLET(5 MG) BY MOUTH TWICE DAILY 42 tablet 0  . atorvastatin (LIPITOR) 40 MG tablet Take 1 tablet (40 mg total) by mouth daily. 90 tablet 0  . bisoprolol (ZEBETA) 5 MG tablet TAKE 1 TABLET BY MOUTH DAILY 30 tablet 1  . Cholecalciferol (VITAMIN D-3) 125 MCG (5000 UT) TABS Take 1 tablet by mouth daily.    [name] lisinopril (ZESTRIL) 20 MG tablet TAKE 1 TABLET BY MOUTH EVERY DAY 90 tablet 0  . nitroGLYCERIN (NITROSTAT) 0.4 MG SL tablet Place 1 tablet (0.4 mg total) under the tongue every 5 (five) minutes as needed for chest pain. 30 tablet 0  . pantoprazole (PROTONIX) 40 MG tablet Take 40 mg by mouth daily.    .  potassium chloride SA (KLOR-CON) 20 MEQ tablet Take 20 mEq by mouth daily.     No current facility-administered medications for this visit.    Past Medical History:  Diagnosis Date  . Anemia   . Coronary artery disease    a. h/o MI in 2000 w/ prior LCX stenting;  b. 7.2014 Abnl Cardiolite, EF 41%, mod-large inferolat scar;  c. 07/2013 Cath/PCI: LM nl, LAD 10-20, D1 40-50p, LCX 95-99 @ distal stent margin (2.5x20 Promus Premier DES), RCA dom, 40p, 37m, 70d, EF 35-40%.  . Elevated cholesterol   . GERD (gastroesophageal reflux disease)   . Hemorrhoids   . Hypertension   . Ischemic cardiomyopathy    a. 07/2013 EF 35-40% by LV gram.  . Myocardial infarction ([place])   . Reflux esophagitis   . Spondylosis of cervical joint    C3-4, C6-7  . Tobacco user   . Vitamin D deficiency disease 08/24/2019    Past Surgical History:  Procedure Laterality Date  . BIOPSY  01/09/2018   Procedure: BIOPSY;  Surgeon: Daneil Dolin, [place];  [facility];  Service: Endoscopy;;  esophageal biopsies  . CARDIAC CATHETERIZATION    . COLONOSCOPY    . COLONOSCOPY N/A 01/25/2016   RMR: normal ileocolonoscopy ( anal canal hemorrhoids)  . CORONARY ANGIOPLASTY WITH STENT PLACEMENT  08/23/2013   mid circumflex  DES    by Dr Martinique  .  CORONARY STENT PLACEMENT  2000  . ESOPHAGOGASTRODUODENOSCOPY N/A 01/25/2016   RMR: Reflux esophagitits. Cervical web with passage of the scope. Status post esophageal biosy. Hiatal hernia.   [name] ESOPHAGOGASTRODUODENOSCOPY N/A 01/09/2018   Procedure: ESOPHAGOGASTRODUODENOSCOPY (EGD);  Surgeon: Daneil Dolin, [place];  [facility];  Service: Endoscopy;  Laterality: N/A;  1:00pm  . LEFT HEART CATHETERIZATION WITH CORONARY ANGIOGRAM N/A 08/23/2013   Procedure: LEFT HEART CATHETERIZATION WITH CORONARY ANGIOGRAM;  Surgeon: Peter M Martinique, [place];  Location: [facility] CATH LAB;  Service: Cardiovascular;  Laterality: N/A;  . [name] DILATION N/A 01/09/2018   Procedure: [name] DILATION;  Surgeon:  Daneil Dolin, [place];  [facility];  Service: Endoscopy;  Laterality: N/A;  . TRANSURETHRAL RESECTION OF PROSTATE  01/09/2012   Procedure: TRANSURETHRAL RESECTION OF THE PROSTATE (TURP);  Surgeon: Marissa Nestle, [place];  Location: [facility] OR[facility];  Service: Urology;  Laterality: N/A;    Social History   Socioeconomic History  . Marital status: Married    Spouse name: [name]  . Number of children: Not on file  . Years of education: 11th grade  . Highest education level: 11th grade  Occupational History  . Occupation: Librarian, academic  Tobacco Use  . Smoking status: Current Every Day Smoker    Packs/day: 1.00    Years: 53.00    Pack years: 53.00    Types: Cigarettes    Start date: 12/09/1965  . Smokeless tobacco: Never Used  . Tobacco comment: 3/4 pack of cigarettes daily; smoked since 74 years old  Substance and Sexual Activity  . Alcohol use: No    Alcohol/week: 0.0 standard drinks  . Drug use: No  . Sexual activity: Yes    Birth control/protection: None  Other Topics Concern  . 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   Social Determinants of Health   Financial Resource Strain:   . Difficulty of Paying Living Expenses: Not on file  Food Insecurity:   . Worried About Charity fundraiser in the Last Year: Not on file  . Ran Out of Food in the Last Year: Not on file  Transportation Needs:   . Lack of Transportation (Medical): Not on file  . Lack of Transportation (Non-Medical): Not on file  Physical Activity:   . Days of Exercise per Week: Not on file  . Minutes of Exercise per Session: Not on file  Stress:   . Feeling of Stress : Not on file  Social Connections:   . Frequency of Communication with Friends and Family: Not on file  . Frequency of Social Gatherings with Friends and Family: Not on file  . Attends Religious Services: Not on file  . Active Member of Clubs or  Organizations: Not on file  . Attends Archivist Meetings: Not on file  . Marital Status: Not on file  Intimate Partner Violence:   . Fear of Current or Ex-Partner: Not on file  . Emotionally Abused: Not on file  . Physically Abused: Not on file  . Sexually Abused: Not on file     Vitals:   12/10/19 1529  BP: (!) 148/78  Pulse: 65  SpO2: 98%  Weight: 138 lb (62.6 kg)  Height: 5\' 5"  (1.651 m)    Wt Readings from Last 3 Encounters:  12/10/19 138 lb (62.6 kg)  12/01/19 136 lb (61.7 kg)  09/17/19 128 lb (58.1 kg)     PHYSICAL  EXAM General: NAD HEENT: Normal. Neck: No JVD, no thyromegaly. Lungs: Clear to auscultation bilaterally with normal respiratory effort. CV: Regular rate and rhythm, normal S1/S2, no S3/S4, no murmur. No pretibial or periankle edema.    Abdomen: Soft, nontender, no distention.  Neurologic: Alert and oriented.  Psych: Normal affect. Skin: Normal. Musculoskeletal: No gross deformities.      Labs: Lab Results  Component Value Date/Time   K 4.1 12/01/2019 03:28 PM   BUN 14 12/01/2019 03:28 PM   CREATININE 1.00 12/01/2019 03:28 PM   ALT 38 12/01/2019 03:28 PM   TSH 0.674 04/10/2015 08:00 PM   HGB 12.9 (L) 11/17/2019 03:46 PM     Lipids: Lab Results  Component Value Date/Time   LDLCALC 79 11/04/2017 02:17 PM   CHOL 131 11/04/2017 02:17 PM   TRIG 80 11/04/2017 02:17 PM   HDL 36 (L) 11/04/2017 02:17 PM      Echocardiogram 10/20/2019:   1. Left ventricular ejection fraction, by visual estimation, is 50%. The left ventricle has low normal function. There is mildly increased left ventricular hypertrophy.  2. Basal and mid inferolateral wall, basal inferior segment, mid anterolateral segment, and mid inferior segment are abnormal.  3. Elevated left ventricular end-diastolic pressure.  4. Left ventricular diastolic parameters are consistent with Grade I diastolic dysfunction (impaired relaxation).  5. Global right ventricle has normal  systolic function.The right ventricular size is normal. No increase in right ventricular wall thickness.  6. Left atrial size was severely dilated.  7. Right atrial size was normal.  8. Mild mitral annular calcification.  9. Moderate aortic valve annular calcification. 10. The mitral valve is degenerative. Mild mitral valve regurgitation. 11. The tricuspid valve is grossly normal. Tricuspid valve regurgitation is not demonstrated. 12. The aortic valve is tricuspid. Aortic valve regurgitation is not visualized. Mild aortic valve sclerosis without stenosis. 13. There is Mild calcification of the aortic valve. 14. There is Mild thickening of the aortic valve. 15. The pulmonic valve was grossly normal. Pulmonic valve regurgitation is trivial. 16. Normal pulmonary artery systolic pressure. 17. The inferior vena cava is normal in size with greater than 50% respiratory variability, suggesting right atrial pressure of 3 mmHg.   ASSESSMENT AND PLAN:  1. CAD: Stable ischemic heart disease.    No aspirin as he is on Eliquis.   Continue bisoprolol and atorvastatin.  2. Essential [id number] pressure is mildly elevated.  Continue to monitor.  3. Hyperlipidemia:Continue atorvastatin.  4. Tobacco abuse history: Continues to smoke1 ppd.  5. Ischemic cardiomyopathy: No signs of heart failure. Continue ACEI and beta blocker.   LVEF low normal, 50%, by echocardiogram 10/20/2019.  6.  New onset atrial fibrillation: Continue Eliquis 5 mg twice daily for anticoagulation.  We will provide 3 weeks worth of samples.  He has filled out some paperwork to obtain some assistance which I will have my nursing staff look into.  Continue bisoprolol 5 mg daily.  There is severe left atrial enlargement.  Hence, maintaining sinus rhythm is unlikely.  I will continue with a rate control and anticoagulation strategy.  That being said, he appears to be in a regular rhythm today.   Disposition: Follow up 6 months virtual  visit   Kate Sable, M.D., F.A.C.C.

## 2019-12-10 NOTE — Patient Instructions (Signed)

## 2019-12-15 ENCOUNTER — Other Ambulatory Visit (INDEPENDENT_AMBULATORY_CARE_PROVIDER_SITE_OTHER): Payer: Self-pay | Admitting: Nurse Practitioner

## 2019-12-15 ENCOUNTER — Other Ambulatory Visit: Payer: Self-pay | Admitting: *Deleted

## 2019-12-15 DIAGNOSIS — I10 Essential (primary) hypertension: Secondary | ICD-10-CM

## 2019-12-15 DIAGNOSIS — K2100 Gastro-esophageal reflux disease with esophagitis, without bleeding: Secondary | ICD-10-CM

## 2019-12-15 DIAGNOSIS — E785 Hyperlipidemia, unspecified: Secondary | ICD-10-CM

## 2019-12-15 MED ORDER — ATORVASTATIN CALCIUM 40 MG PO TABS: 40.0000 mg | ORAL_TABLET | Freq: Every day | ORAL | 0 refills | Status: DC

## 2019-12-15 MED ORDER — AMLODIPINE BESYLATE 5 MG PO TABS: 5.0000 mg | ORAL_TABLET | Freq: Every day | ORAL | 0 refills | Status: DC

## 2019-12-15 MED ORDER — PANTOPRAZOLE SODIUM 40 MG PO TBEC: 40.0000 mg | DELAYED_RELEASE_TABLET | Freq: Every day | ORAL | 0 refills | Status: DC

## 2019-12-15 MED ORDER — BISOPROLOL FUMARATE 5 MG PO TABS: 5.0000 mg | ORAL_TABLET | Freq: Every day | ORAL | 0 refills | Status: DC

## 2019-12-15 MED ORDER — POTASSIUM CHLORIDE CRYS ER 20 MEQ PO TBCR: 20.0000 meq | EXTENDED_RELEASE_TABLET | Freq: Every day | ORAL | 2 refills | Status: DC

## 2019-12-15 MED ORDER — APIXABAN 5 MG PO TABS: ORAL_TABLET | ORAL | 2 refills | Status: DC

## 2019-12-15 MED ORDER — LISINOPRIL 20 MG PO TABS: 20.0000 mg | ORAL_TABLET | Freq: Every day | ORAL | 0 refills | Status: DC

## 2019-12-17 ENCOUNTER — Ambulatory Visit: Payer: Medicare Other | Admitting: Cardiovascular Disease

## 2019-12-18 ENCOUNTER — Other Ambulatory Visit (INDEPENDENT_AMBULATORY_CARE_PROVIDER_SITE_OTHER): Payer: Self-pay | Admitting: Nurse Practitioner

## 2019-12-18 DIAGNOSIS — E785 Hyperlipidemia, unspecified: Secondary | ICD-10-CM

## 2019-12-18 DIAGNOSIS — I10 Essential (primary) hypertension: Secondary | ICD-10-CM

## 2019-12-18 DIAGNOSIS — K2100 Gastro-esophageal reflux disease with esophagitis, without bleeding: Secondary | ICD-10-CM

## 2019-12-29 ENCOUNTER — Other Ambulatory Visit (INDEPENDENT_AMBULATORY_CARE_PROVIDER_SITE_OTHER): Payer: Self-pay | Admitting: Nurse Practitioner

## 2019-12-29 DIAGNOSIS — K2100 Gastro-esophageal reflux disease with esophagitis, without bleeding: Secondary | ICD-10-CM

## 2019-12-29 DIAGNOSIS — E785 Hyperlipidemia, unspecified: Secondary | ICD-10-CM

## 2019-12-29 DIAGNOSIS — I10 Essential (primary) hypertension: Secondary | ICD-10-CM

## 2020-01-04 ENCOUNTER — Other Ambulatory Visit: Payer: Self-pay

## 2020-01-04 DIAGNOSIS — E785 Hyperlipidemia, unspecified: Secondary | ICD-10-CM

## 2020-01-04 DIAGNOSIS — K2100 Gastro-esophageal reflux disease with esophagitis, without bleeding: Secondary | ICD-10-CM

## 2020-01-04 DIAGNOSIS — I10 Essential (primary) hypertension: Secondary | ICD-10-CM

## 2020-01-04 MED ORDER — AMLODIPINE BESYLATE 5 MG PO TABS: 5.0000 mg | ORAL_TABLET | Freq: Every day | ORAL | 1 refills | Status: DC

## 2020-01-04 NOTE — Telephone Encounter (Signed)
Refilled amlodipine 

## 2020-01-14 DIAGNOSIS — R319 Hematuria, unspecified: Secondary | ICD-10-CM | POA: Insufficient documentation

## 2020-01-14 DIAGNOSIS — Z720 Tobacco use: Secondary | ICD-10-CM | POA: Diagnosis present

## 2020-01-14 DIAGNOSIS — N189 Chronic kidney disease, unspecified: Secondary | ICD-10-CM

## 2020-01-14 DIAGNOSIS — K449 Diaphragmatic hernia without obstruction or gangrene: Secondary | ICD-10-CM | POA: Insufficient documentation

## 2020-01-14 DIAGNOSIS — M5030 Other cervical disc degeneration, unspecified cervical region: Secondary | ICD-10-CM | POA: Insufficient documentation

## 2020-01-14 DIAGNOSIS — N183 Chronic kidney disease, stage 3 unspecified: Secondary | ICD-10-CM | POA: Insufficient documentation

## 2020-01-14 HISTORY — DX: Chronic kidney disease, unspecified: N18.9

## 2020-01-20 ENCOUNTER — Other Ambulatory Visit (HOSPITAL_COMMUNITY): Payer: Self-pay | Admitting: Nephrology

## 2020-01-20 DIAGNOSIS — I129 Hypertensive chronic kidney disease with stage 1 through stage 4 chronic kidney disease, or unspecified chronic kidney disease: Secondary | ICD-10-CM | POA: Diagnosis not present

## 2020-01-20 DIAGNOSIS — N178 Other acute kidney failure: Secondary | ICD-10-CM | POA: Diagnosis not present

## 2020-01-20 DIAGNOSIS — R809 Proteinuria, unspecified: Secondary | ICD-10-CM | POA: Diagnosis not present

## 2020-01-20 DIAGNOSIS — N182 Chronic kidney disease, stage 2 (mild): Secondary | ICD-10-CM | POA: Diagnosis not present

## 2020-01-20 DIAGNOSIS — E876 Hypokalemia: Secondary | ICD-10-CM | POA: Diagnosis not present

## 2020-01-20 DIAGNOSIS — N189 Chronic kidney disease, unspecified: Secondary | ICD-10-CM

## 2020-01-26 ENCOUNTER — Encounter (HOSPITAL_COMMUNITY): Payer: Self-pay

## 2020-01-26 ENCOUNTER — Ambulatory Visit (HOSPITAL_COMMUNITY): Admission: RE | Admit: 2020-01-26 | Payer: Medicare Other | Source: Ambulatory Visit

## 2020-01-31 DIAGNOSIS — E876 Hypokalemia: Secondary | ICD-10-CM | POA: Diagnosis not present

## 2020-01-31 DIAGNOSIS — N182 Chronic kidney disease, stage 2 (mild): Secondary | ICD-10-CM | POA: Diagnosis not present

## 2020-01-31 DIAGNOSIS — I129 Hypertensive chronic kidney disease with stage 1 through stage 4 chronic kidney disease, or unspecified chronic kidney disease: Secondary | ICD-10-CM | POA: Diagnosis not present

## 2020-01-31 DIAGNOSIS — R809 Proteinuria, unspecified: Secondary | ICD-10-CM | POA: Diagnosis not present

## 2020-01-31 DIAGNOSIS — N178 Other acute kidney failure: Secondary | ICD-10-CM | POA: Diagnosis not present

## 2020-02-03 ENCOUNTER — Ambulatory Visit (HOSPITAL_COMMUNITY)
Admission: RE | Admit: 2020-02-03 | Discharge: 2020-02-03 | Disposition: A | Discharge status: Home / Self Care | Payer: Medicare Other | Source: Ambulatory Visit | Attending: Nephrology | Admitting: Nephrology

## 2020-02-03 ENCOUNTER — Other Ambulatory Visit: Payer: Self-pay

## 2020-02-03 DIAGNOSIS — N189 Chronic kidney disease, unspecified: Secondary | ICD-10-CM | POA: Insufficient documentation

## 2020-02-03 DIAGNOSIS — N281 Cyst of kidney, acquired: Secondary | ICD-10-CM | POA: Diagnosis not present

## 2020-02-07 ENCOUNTER — Other Ambulatory Visit: Payer: Self-pay | Admitting: *Deleted

## 2020-02-07 MED ORDER — POTASSIUM CHLORIDE ER 10 MEQ PO TBCR: 20.0000 meq | EXTENDED_RELEASE_TABLET | Freq: Every day | ORAL | 3 refills | Status: DC

## 2020-02-15 ENCOUNTER — Other Ambulatory Visit (INDEPENDENT_AMBULATORY_CARE_PROVIDER_SITE_OTHER): Payer: Self-pay | Admitting: Nurse Practitioner

## 2020-02-15 DIAGNOSIS — K2100 Gastro-esophageal reflux disease with esophagitis, without bleeding: Secondary | ICD-10-CM

## 2020-02-15 DIAGNOSIS — I10 Essential (primary) hypertension: Secondary | ICD-10-CM

## 2020-02-15 DIAGNOSIS — E785 Hyperlipidemia, unspecified: Secondary | ICD-10-CM

## 2020-03-03 DIAGNOSIS — N170 Acute kidney failure with tubular necrosis: Secondary | ICD-10-CM | POA: Diagnosis not present

## 2020-03-03 DIAGNOSIS — E876 Hypokalemia: Secondary | ICD-10-CM | POA: Diagnosis not present

## 2020-03-03 DIAGNOSIS — N182 Chronic kidney disease, stage 2 (mild): Secondary | ICD-10-CM | POA: Diagnosis not present

## 2020-03-03 DIAGNOSIS — I5032 Chronic diastolic (congestive) heart failure: Secondary | ICD-10-CM | POA: Diagnosis not present

## 2020-03-03 DIAGNOSIS — I129 Hypertensive chronic kidney disease with stage 1 through stage 4 chronic kidney disease, or unspecified chronic kidney disease: Secondary | ICD-10-CM | POA: Diagnosis not present

## 2020-03-07 ENCOUNTER — Other Ambulatory Visit (INDEPENDENT_AMBULATORY_CARE_PROVIDER_SITE_OTHER): Payer: Self-pay | Admitting: Internal Medicine

## 2020-03-07 DIAGNOSIS — K2100 Gastro-esophageal reflux disease with esophagitis, without bleeding: Secondary | ICD-10-CM

## 2020-03-07 DIAGNOSIS — E785 Hyperlipidemia, unspecified: Secondary | ICD-10-CM

## 2020-03-07 DIAGNOSIS — I10 Essential (primary) hypertension: Secondary | ICD-10-CM

## 2020-03-09 ENCOUNTER — Ambulatory Visit (INDEPENDENT_AMBULATORY_CARE_PROVIDER_SITE_OTHER): Payer: Medicare Other | Admitting: Nurse Practitioner

## 2020-03-09 ENCOUNTER — Encounter (INDEPENDENT_AMBULATORY_CARE_PROVIDER_SITE_OTHER): Payer: Self-pay | Admitting: Nurse Practitioner

## 2020-03-09 ENCOUNTER — Other Ambulatory Visit: Payer: Self-pay

## 2020-03-09 VITALS — BP 140/65 | HR 68 | Temp 98.8°F | Ht 60.0 in | Wt 133.0 lb

## 2020-03-09 DIAGNOSIS — Z0000 Encounter for general adult medical examination without abnormal findings: Secondary | ICD-10-CM

## 2020-03-09 DIAGNOSIS — Z122 Encounter for screening for malignant neoplasm of respiratory organs: Secondary | ICD-10-CM

## 2020-03-09 DIAGNOSIS — Z113 Encounter for screening for infections with a predominantly sexual mode of transmission: Secondary | ICD-10-CM | POA: Diagnosis not present

## 2020-03-09 NOTE — Progress Notes (Signed)
Subjective:   Ronald Mcdowell is a 74 y.o. male who presents for Medicare Annual/Subsequent preventive examination.  Review of Systems:  Reviewed and negative Cardiac Risk Factors include: none  Patient does tell me today that he is not feeling well when he takes magnesium supplement that was prescribed to him by his nephrologist.  Per chart review I see that his magnesium was marginally low at 1.4 when it was checked by his nephrologist approximately 6 weeks ago.  The patient was started on magnesium capsules 100 mg by mouth daily.  When he takes the medication he tells me he feels weak and lightheaded, so he has stopped taking this.  Otherwise he feels well today.     Objective:    Vitals: BP 140/65 (BP Location: Left Arm, Patient Position: Sitting, Cuff Size: Normal)   Pulse 68   Temp 98.8 F (37.1 C) (Temporal)   Ht 5' (1.524 m)   Wt 133 lb (60.3 kg)   SpO2 97%   BMI 25.97 kg/m   Body mass index is 25.97 kg/m.     Advanced Directives 03/09/2020 01/20/2018 01/09/2018 01/28/2016 01/25/2016 01/13/2016 11/11/2015  Does Patient Have a Medical Advance Directive? Yes No No No No No No  Type of Advance Directive Living will - - - - - -  Does patient want to make changes to medical advance directive? No - Patient declined - - - - - -  Would patient like information on creating a medical advance directive? - No - Patient declined No - Patient declined No - patient declined information No - patient declined information - -  Pre-existing out of facility DNR order (yellow form or pink MOST form) - - - - - - -    Tobacco Social History   Tobacco Use  Smoking Status Current Every Day Smoker  . Packs/day: 1.00  . Years: 53.00  . Pack years: 53.00  . Types: Cigarettes  . Start date: 12/09/1965  Smokeless Tobacco Never Used  Tobacco Comment   3/4 pack of cigarettes daily; smoked since 74 years old     Ready to quit: Not Answered Counseling given: Not Answered Comment: 3/4 pack of  cigarettes daily; smoked since 74 years old   Clinical Intake:  Pre-visit preparation completed: Yes  Pain : No/denies pain     Nutritional Status: BMI <19  Underweight Nutritional Risks: Unintentional weight loss Diabetes: No  How often do you need to have someone help you when you read instructions, pamphlets, or other written materials from your doctor or pharmacy?: 3 - Sometimes(wife will help occasionaly) What is the last grade level you completed in school?: 11  Interpreter Needed?: No  Information entered by :: Awilda Bill, [place]  Past Medical History:  Diagnosis Date  . Anemia   . Coronary artery disease    a. h/o MI in 2000 w/ prior LCX stenting;  b. 7.2014 Abnl Cardiolite, EF 41%, mod-large inferolat scar;  c. 07/2013 Cath/PCI: LM nl, LAD 10-20, D1 40-50p, LCX 95-99 @ distal stent margin (2.5x20 Promus Premier DES), RCA dom, 40p, 13m, 70d, EF 35-40%.  . Elevated cholesterol   . GERD (gastroesophageal reflux disease)   . Hemorrhoids   . Hypertension   . Ischemic cardiomyopathy    a. 07/2013 EF 35-40% by LV gram.  . Myocardial infarction ([place])   . Reflux esophagitis   . Spondylosis of cervical joint    C3-4, C6-7  . Tobacco user   . Vitamin D deficiency disease 08/24/2019  Past Surgical History:  Procedure Laterality Date  . BIOPSY  01/09/2018   Procedure: BIOPSY;  Surgeon: Daneil Dolin, [place];  [facility];  Service: Endoscopy;;  esophageal biopsies  . CARDIAC CATHETERIZATION    . COLONOSCOPY    . COLONOSCOPY N/A 01/25/2016   RMR: normal ileocolonoscopy ( anal canal hemorrhoids)  . CORONARY ANGIOPLASTY WITH STENT PLACEMENT  08/23/2013   mid circumflex  DES    by Dr Martinique  . CORONARY STENT PLACEMENT  2000  . ESOPHAGOGASTRODUODENOSCOPY N/A 01/25/2016   RMR: Reflux esophagitits. Cervical web with passage of the scope. Status post esophageal biosy. Hiatal hernia.   [name] ESOPHAGOGASTRODUODENOSCOPY N/A 01/09/2018   Procedure: ESOPHAGOGASTRODUODENOSCOPY  (EGD);  Surgeon: Daneil Dolin, [place];  [facility];  Service: Endoscopy;  Laterality: N/A;  1:00pm  . LEFT HEART CATHETERIZATION WITH CORONARY ANGIOGRAM N/A 08/23/2013   Procedure: LEFT HEART CATHETERIZATION WITH CORONARY ANGIOGRAM;  Surgeon: Peter M Martinique, [place];  Location: [facility] CATH LAB;  Service: Cardiovascular;  Laterality: N/A;  . [name] DILATION N/A 01/09/2018   Procedure: [name] DILATION;  Surgeon: Daneil Dolin, [place];  [facility];  Service: Endoscopy;  Laterality: N/A;  . TRANSURETHRAL RESECTION OF PROSTATE  01/09/2012   Procedure: TRANSURETHRAL RESECTION OF THE PROSTATE (TURP);  Surgeon: Marissa Nestle, [place];  Location: [facility];  Service: Urology;  Laterality: N/A;   Family History  Problem Relation Age of Onset  . Early death Mother        childbirth  . Heart attack Father   . Cancer Father   . Heart disease Brother        CABG  . Heart disease Brother        slight heart attack  . Anesthesia problems Neg Hx   . Hypotension Neg Hx   . Malignant hyperthermia Neg Hx   . Pseudochol deficiency Neg Hx   . Colon cancer Neg Hx   . Liver disease Neg Hx   . Gastric cancer Neg Hx   . Esophageal cancer Neg Hx    Social History   Socioeconomic History  . Marital status: Married    Spouse name: [name]  . Number of children: Not on file  . Years of education: 11th grade  . Highest education level: 11th grade  Occupational History  . Occupation: Librarian, academic  Tobacco Use  . Smoking status: Current Every Day Smoker    Packs/day: 1.00    Years: 53.00    Pack years: 53.00    Types: Cigarettes    Start date: 12/09/1965  . Smokeless tobacco: Never Used  . Tobacco comment: 3/4 pack of cigarettes daily; smoked since 74 years old  Substance and Sexual Activity  . Alcohol use: No    Alcohol/week: 0.0 standard drinks  . Drug use: No  . Sexual activity: Yes    Birth control/protection: None  Other Topics Concern  . 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   Social Determinants of Health   Financial Resource Strain:   . Difficulty of Paying Living Expenses:   Food Insecurity:   . Worried About Charity fundraiser in the Last Year:   . Arboriculturist in the Last Year:   Transportation Needs:   . Film/video editor (Medical):   [name] Lack of Transportation (Non-Medical):   Physical Activity:   . Days of Exercise per Week:   .  Minutes of Exercise per Session:   Stress:   . Feeling of Stress :   Social Connections:   . Frequency of Communication with Friends and Family:   . Frequency of Social Gatherings with Friends and Family:   . Attends Religious Services:   . Active Member of Clubs or Organizations:   . Attends Archivist Meetings:   [name] Marital Status:     Outpatient Encounter Medications as of 03/09/2020  Medication Sig  . amLODipine (NORVASC) 5 MG tablet TAKE 1 TABLET BY MOUTH  DAILY  . apixaban (ELIQUIS) 5 MG TABS tablet TAKE 1 TABLET(5 MG) BY MOUTH TWICE DAILY  . atorvastatin (LIPITOR) 40 MG tablet TAKE 1 TABLET(40 MG) BY MOUTH DAILY  . bisoprolol (ZEBETA) 5 MG tablet TAKE 1 TABLET BY MOUTH  DAILY  . Cholecalciferol (VITAMIN D-3) 125 MCG (5000 UT) TABS Take 1 tablet by mouth daily.  [name] lisinopril (ZESTRIL) 20 MG tablet TAKE 1 TABLET BY MOUTH EVERY DAY  . nitroGLYCERIN (NITROSTAT) 0.4 MG SL tablet Place 1 tablet (0.4 mg total) under the tongue every 5 (five) minutes as needed for chest pain.  . pantoprazole (PROTONIX) 40 MG tablet TAKE 1 TABLET BY MOUTH  DAILY  . potassium chloride (KLOR-CON) 10 MEQ tablet Take 2 tablets (20 mEq total) by mouth daily.  . [DISCONTINUED] amLODipine (NORVASC) 5 MG tablet Take 1 tablet (5 mg total) by mouth daily.  . [DISCONTINUED] atorvastatin (LIPITOR) 40 MG tablet Take 1 tablet (40 mg total) by mouth at bedtime. Reported on 11/30/2015  . [DISCONTINUED] bisoprolol (ZEBETA) 5 MG tablet TAKE 1  TABLET BY MOUTH DAILY  . [DISCONTINUED] lisinopril (ZESTRIL) 20 MG tablet Take 20 mg by mouth daily.   No facility-administered encounter medications on file as of 03/09/2020.    Activities of Daily Living In your present state of health, do you have any difficulty performing the following activities: 03/09/2020  Hearing? N  Vision? Y  Comment wears glasses  Difficulty concentrating or making decisions? N  Walking or climbing stairs? N  Dressing or bathing? N  Doing errands, shopping? N  Preparing Food and eating ? N  Using the Toilet? N  In the past six months, have you accidently leaked urine? N  Do you have problems with loss of bowel control? N  Managing your Medications? N  Managing your Finances? N  Housekeeping or managing your Housekeeping? N  Some recent data might be hidden    Patient Care Team: Doree Albee, MD as PCP - General (Internal Medicine) Herminio Commons, MD as PCP - Cardiology (Cardiology) Gala Romney Cristopher Estimable, MD as Consulting Physician (Gastroenterology)   Assessment:   This is a routine wellness examination for [place].  Exercise Activities and Dietary recommendations Current Exercise Habits: The patient does not participate in regular exercise at present, Exercise limited by: None identified  Goals    . Quit Smoking       Fall Risk Fall Risk  03/09/2020 11/04/2017  Falls in the past year? 0 No  Number falls in past yr: 0 -  Injury with Fall? 0 -  Risk for fall due to : No Fall Risks -  Follow up Falls evaluation completed -   Is the patient's home free of loose throw rugs in walkways, pet beds, electrical cords, etc?   yes      Grab bars in the bathroom? no      Handrails on the stairs?   no      Adequate  lighting?   yes  Timed Get Up and Go Performed: 7 seconds  Depression Screen PHQ 2/9 Scores 03/09/2020 11/04/2017  PHQ - 2 Score 0 0  Exception Documentation Medical reason -    Cognitive Function     6CIT Screen 03/09/2020  03/09/2020  What Year? - 0 points  What month? - 0 points  What time? - 0 points  Count back from 20 - 0 points  Months in reverse - 0 points  Repeat phrase 4 points 0 points  Total Score - 0    Immunization History  Administered Date(s) Administered  . Influenza,inj,Quad PF,6+ Mos 08/24/2013, 11/04/2017  . Pneumococcal Conjugate-13 04/22/2019    Qualifies for Shingles Vaccine?  Yes  Screening Tests Health Maintenance  Topic Date Due  . TETANUS/TDAP  Never done  . PNA vac Low Risk Adult (2 of 2 - PPSV23) 04/21/2020  . INFLUENZA VACCINE  06/25/2020  . COLONOSCOPY  01/24/2026  . Hepatitis C Screening  Completed   Cancer Screenings: Lung: Low Dose CT Chest recommended if Age 23-80 years, 30 pack-year currently smoking OR have quit w/in 15years. Patient does qualify. Colorectal: Completed, due again in 2027  Additional Screenings: Hepatitis C Screening: Completed and negative      Plan:   I will recheck metabolic panel today to evaluate magnesium levels.  He has started his COVID-19 vaccine series, and I have encouraged him to get his second shot as scheduled.  He will be due for colonoscopy again in 2027.  He continues to smoke, but is trying to cut back.  Abdominal aortic aneurysm completed in July 2020 was negative.  Depression screen negative today.  Falls screening shows low risk for falls.  Hepatitis C screening completed in 2017.  Sexual transmitted infection screening completed in 2016, however patient would like to be rescreened today.  His accurate is approximately 22, and he is a current smoker, thus he would qualify for lung cancer screening via CT scan.  He would like to undergo this test.  I did discuss the risks including exposure to radiation and possibly having to undergo future testing based on what results show including possibility of undergoing invasive testing; and, benefits are to help with thyroid disease and early stage.  He tells me he is concerned as he  feels like he has been losing weight as of recently.  Thus, he would like to undergo screening.  I will place order.  He will follow-up with Dr. Anastasio Champion in approximately 1 month, but was encouraged to call with any questions or concerns prior to his upcoming appointment.  I have personally reviewed and noted the following in the patient's chart:   . Medical and social history . Use of alcohol, tobacco or illicit drugs  . Current medications and supplements . Functional ability and status . Nutritional status . Physical activity . Advanced directives . List of other physicians . Hospitalizations, surgeries, and ER visits in previous 12 months . Vitals . Screenings to include cognitive, depression, and falls . Referrals and appointments  In addition, I have reviewed and discussed with patient certain preventive protocols, quality metrics, and best practice recommendations. A written personalized care plan for preventive services as well as general preventive health recommendations were provided to patient.     Rodney Langton, [place]  03/09/2020

## 2020-03-09 NOTE — Patient Instructions (Signed)
  Mr. Gundlach , Thank you for taking time to come for your Medicare Wellness Visit. I appreciate your ongoing commitment to your health goals. Please review the following plan we discussed and let me know if I can assist you in the future.   These are the goals we discussed: Goals    . Quit Smoking       This is a list of the screening recommended for you and due dates:  Health Maintenance  Topic Date Due  . Pneumonia vaccines (2 of 2 - PPSV23) 04/21/2020  . Flu Shot  06/25/2020  . Colon Cancer Screening  01/24/2026  . Tetanus Vaccine  02/24/2027  .  Hepatitis C: One time screening is recommended by Center for Disease Control  (CDC) for  adults born from [birth year] through 1964.   Completed

## 2020-03-10 ENCOUNTER — Encounter (INDEPENDENT_AMBULATORY_CARE_PROVIDER_SITE_OTHER): Payer: Self-pay | Admitting: Nurse Practitioner

## 2020-03-10 LAB — COMPLETE METABOLIC PANEL WITH GFR
AG Ratio: 1.3 (calc) (ref 1.0–2.5)
ALT: 66 U/L — ABNORMAL HIGH (ref 9–46)
AST: 31 U/L (ref 10–35)
Albumin: 3.9 g/dL (ref 3.6–5.1)
Alkaline phosphatase (APISO): 63 U/L (ref 35–144)
BUN: 25 mg/dL (ref 7–25)
CO2: 24 mmol/L (ref 20–32)
Calcium: 9.7 mg/dL (ref 8.6–10.3)
Chloride: 109 mmol/L (ref 98–110)
Creat: 0.95 mg/dL (ref 0.70–1.18)
GFR, Est African American: 92 mL/min/{1.73_m2} (ref >=60)
GFR, Est Non African American: 80 mL/min/{1.73_m2} (ref >=60)
Globulin: 3.1 g/dL (calc) (ref 1.9–3.7)
Glucose, Bld: 94 mg/dL (ref 65–99)
Potassium: 4.3 mmol/L (ref 3.5–5.3)
Sodium: 140 mmol/L (ref 135–146)
Total Bilirubin: 0.6 mg/dL (ref 0.2–1.2)
Total Protein: 7 g/dL (ref 6.1–8.1)

## 2020-03-10 LAB — MAGNESIUM: Magnesium: 1.5 mg/dL (ref 1.5–2.5)

## 2020-03-10 LAB — HIV ANTIBODY (ROUTINE TESTING W REFLEX): HIV 1&2 Ab, 4th Generation: NONREACTIVE

## 2020-03-10 LAB — RPR: RPR Ser Ql: NONREACTIVE

## 2020-03-10 NOTE — Telephone Encounter (Signed)
This encounter was created in error - please disregard.

## 2020-03-13 ENCOUNTER — Other Ambulatory Visit (INDEPENDENT_AMBULATORY_CARE_PROVIDER_SITE_OTHER): Payer: Self-pay | Admitting: Nurse Practitioner

## 2020-03-13 ENCOUNTER — Telehealth (INDEPENDENT_AMBULATORY_CARE_PROVIDER_SITE_OTHER): Payer: Self-pay

## 2020-03-13 DIAGNOSIS — I10 Essential (primary) hypertension: Secondary | ICD-10-CM

## 2020-03-13 DIAGNOSIS — K2100 Gastro-esophageal reflux disease with esophagitis, without bleeding: Secondary | ICD-10-CM

## 2020-03-13 DIAGNOSIS — E785 Hyperlipidemia, unspecified: Secondary | ICD-10-CM

## 2020-03-13 NOTE — Telephone Encounter (Signed)
Appt is set. Gave wife the appt information.

## 2020-03-14 ENCOUNTER — Other Ambulatory Visit: Payer: Self-pay

## 2020-03-14 ENCOUNTER — Ambulatory Visit (INDEPENDENT_AMBULATORY_CARE_PROVIDER_SITE_OTHER): Payer: Medicare Other | Admitting: Internal Medicine

## 2020-03-14 ENCOUNTER — Encounter (INDEPENDENT_AMBULATORY_CARE_PROVIDER_SITE_OTHER): Payer: Self-pay | Admitting: Internal Medicine

## 2020-03-14 VITALS — BP 140/70 | HR 65 | Temp 98.2°F | Ht 65.0 in | Wt 132.2 lb

## 2020-03-14 DIAGNOSIS — Z720 Tobacco use: Secondary | ICD-10-CM | POA: Diagnosis not present

## 2020-03-14 DIAGNOSIS — R748 Abnormal levels of other serum enzymes: Secondary | ICD-10-CM | POA: Diagnosis not present

## 2020-03-14 DIAGNOSIS — I25118 Atherosclerotic heart disease of native coronary artery with other forms of angina pectoris: Secondary | ICD-10-CM

## 2020-03-14 DIAGNOSIS — I10 Essential (primary) hypertension: Secondary | ICD-10-CM

## 2020-03-14 DIAGNOSIS — R197 Diarrhea, unspecified: Secondary | ICD-10-CM

## 2020-03-14 DIAGNOSIS — R634 Abnormal weight loss: Secondary | ICD-10-CM

## 2020-03-14 NOTE — Progress Notes (Signed)
Metrics: Intervention Frequency ACO  Documented Smoking Status Yearly  Screened one or more times in 24 months  Cessation Counseling or  Active cessation medication Past 24 months  Past 24 months   Guideline developer: UpToDate (See UpToDate for funding source) Date Released: 2014       Wellness Office Visit  Subjective:  Patient ID: Ronald Mcdowell, male    DOB: [birth year]  Age: 74 y.o. MRN: [id number]  CC: This man comes in for an acute visit because he is concerned about dizziness and abnormal stools. HPI  Looking back in his records, he has had dizziness for several months and this is nothing new.  I believe we may have had this investigated previously. As far as his stools are concerned, they are diarrheal in nature but there is no blood in the stools.  He does not have any significant abdominal pain.  There is no nausea or vomiting. He is also concerned about weight loss for the last few months and he thinks he has lost about 12 to 15 pounds since the beginning of the year unintentionally. He continues to be a heavy smoker and is due to have a CT scan of the chest for lung cancer screening. Past Medical History:  Diagnosis Date  . Anemia   . Coronary artery disease    a. h/o MI in 2000 w/ prior LCX stenting;  b. 7.2014 Abnl Cardiolite, EF 41%, mod-large inferolat scar;  c. 07/2013 Cath/PCI: LM nl, LAD 10-20, D1 40-50p, LCX 95-99 @ distal stent margin (2.5x20 Promus Premier DES), RCA dom, 40p, 57m, 70d, EF 35-40%.  . Elevated cholesterol   . GERD (gastroesophageal reflux disease)   . Hemorrhoids   . Hypertension   . Ischemic cardiomyopathy    a. 07/2013 EF 35-40% by LV gram.  . Myocardial infarction (Ronald Mcdowell)   . Reflux esophagitis   . Spondylosis of cervical joint    C3-4, C6-7  . Tobacco user   . Vitamin D deficiency disease 08/24/2019      Family History  Problem Relation Age of Onset  . Early death Mother        childbirth  . Heart attack Father   . Cancer Father   .  Heart disease Brother        CABG  . Heart disease Brother        slight heart attack  . Anesthesia problems Neg Hx   . Hypotension Neg Hx   . Malignant hyperthermia Neg Hx   . Pseudochol deficiency Neg Hx   . Colon cancer Neg Hx   . Liver disease Neg Hx   . Gastric cancer Neg Hx   . 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   Social History   Tobacco Use  . Smoking status: Current Every Day Smoker    Packs/day: 1.00    Years: 53.00    Pack years: 53.00    Types: Cigarettes    Start date: 12/09/1965  . Smokeless tobacco: Never Used  . Tobacco comment: 3/4 pack of cigarettes daily; smoked since 74 years old  Substance Use Topics  . Alcohol use: No    Alcohol/week: 0.0 standard drinks    Current Meds  Medication Sig  . amLODipine (NORVASC) 5 MG tablet TAKE 1 TABLET BY MOUTH  DAILY  . apixaban (ELIQUIS) 5 MG TABS tablet TAKE 1 TABLET(5  MG) BY MOUTH TWICE DAILY  . atorvastatin (LIPITOR) 40 MG tablet TAKE 1 TABLET(40 MG) BY MOUTH DAILY  . bisoprolol (ZEBETA) 5 MG tablet TAKE 1 TABLET BY MOUTH  DAILY  . Cholecalciferol (VITAMIN D-3) 125 MCG (5000 UT) TABS Take 1 tablet by mouth daily.  [name] lisinopril (ZESTRIL) 20 MG tablet TAKE 1 TABLET BY MOUTH  DAILY  . nitroGLYCERIN (NITROSTAT) 0.4 MG SL tablet Place 1 tablet (0.4 mg total) under the tongue every 5 (five) minutes as needed for chest pain.  . pantoprazole (PROTONIX) 40 MG tablet TAKE 1 TABLET BY MOUTH  DAILY  . potassium chloride (KLOR-CON) 10 MEQ tablet Take 2 tablets (20 mEq total) by mouth daily.      Objective:   Today's Vitals: BP 140/70 (BP Location: Left Arm, Patient Position: Sitting, Cuff Size: Normal)   Pulse 65   Temp 98.2 F (36.8 C) (Temporal)   Ht 5\' 5"  (1.651 m)   Wt 132 lb 3.2 oz (60 kg)   SpO2 98%   BMI 22.00 kg/m  Vitals with BMI 03/14/2020 03/09/2020 12/10/2019    Height 5\' 5"  5\' 0"  5\' 5"   Weight 132 lbs 3 oz 133 lbs 138 lbs  BMI 22 [id number]  Systolic [id number]  Diastolic 70 65 78  Pulse 65 68 65     Physical Exam  He does not look particularly cachectic.  His blood pressure is normal.  There are no focal neurological signs.     Assessment   1. Essential hypertension   2. Tobacco use   3. Coronary artery disease of native artery of native heart with stable angina pectoris (Ronald Mcdowell)   4. Abnormal liver enzymes   5. Diarrhea, unspecified type   6. Weight loss       Tests ordered Orders Placed This Encounter  Procedures  . COMPLETE METABOLIC PANEL WITH GFR  . CBC  . T3, free  . T4  . TSH     Plan: 1. Blood work is ordered to see if there are any abnormalities looking for etiologies for weight loss. 2. I think the dizziness is chronic and may not be really pathological. 3. Further recommendations will depend on blood results and we may need to get MRI brain scan if these blood results are completely negative/normal. 4. Follow-up with Sarah in about 3 months time.   No orders of the defined types were placed in this encounter.   Doree Albee, MD

## 2020-03-15 ENCOUNTER — Other Ambulatory Visit (INDEPENDENT_AMBULATORY_CARE_PROVIDER_SITE_OTHER): Payer: Self-pay | Admitting: Internal Medicine

## 2020-03-15 DIAGNOSIS — E0590 Thyrotoxicosis, unspecified without thyrotoxic crisis or storm: Secondary | ICD-10-CM

## 2020-03-15 LAB — CBC
HCT: 38.2 % — ABNORMAL LOW (ref 38.5–50.0)
Hemoglobin: 12.7 g/dL — ABNORMAL LOW (ref 13.2–17.1)
MCH: 28.9 pg (ref 27.0–33.0)
MCHC: 33.2 g/dL (ref 32.0–36.0)
MCV: 86.8 fL (ref 80.0–100.0)
MPV: 12 fL (ref 7.5–12.5)
Platelets: 166 10*3/uL (ref 140–400)
RBC: 4.4 10*6/uL (ref 4.20–5.80)
RDW: 12.2 % (ref 11.0–15.0)
WBC: 7.9 10*3/uL (ref 3.8–10.8)

## 2020-03-15 LAB — T3, FREE: T3, Free: >20 pg/mL — ABNORMAL HIGH (ref 2.3–4.2)

## 2020-03-15 LAB — COMPLETE METABOLIC PANEL WITH GFR
AG Ratio: 1.2 (calc) (ref 1.0–2.5)
ALT: 84 U/L — ABNORMAL HIGH (ref 9–46)
AST: 38 U/L — ABNORMAL HIGH (ref 10–35)
Albumin: 3.8 g/dL (ref 3.6–5.1)
Alkaline phosphatase (APISO): 73 U/L (ref 35–144)
BUN: 23 mg/dL (ref 7–25)
CO2: 23 mmol/L (ref 20–32)
Calcium: 9.7 mg/dL (ref 8.6–10.3)
Chloride: 111 mmol/L — ABNORMAL HIGH (ref 98–110)
Creat: 0.93 mg/dL (ref 0.70–1.18)
GFR, Est African American: 95 mL/min/{1.73_m2} (ref >=60)
GFR, Est Non African American: 82 mL/min/{1.73_m2} (ref >=60)
Globulin: 3.3 g/dL (calc) (ref 1.9–3.7)
Glucose, Bld: 96 mg/dL (ref 65–99)
Potassium: 4.6 mmol/L (ref 3.5–5.3)
Sodium: 140 mmol/L (ref 135–146)
Total Bilirubin: 0.7 mg/dL (ref 0.2–1.2)
Total Protein: 7.1 g/dL (ref 6.1–8.1)

## 2020-03-15 LAB — T4: T4, Total: 19.3 ug/dL — ABNORMAL HIGH (ref 4.9–10.5)

## 2020-03-15 LAB — TSH: TSH: 0.01 mIU/L — ABNORMAL LOW (ref 0.40–4.50)

## 2020-03-15 NOTE — Progress Notes (Signed)
Please let the patient know that he has hyperthyroidism, an overactive thyroid and he will need referral to endocrinology.  Please refer him to Dr. Dorris Fetch.  I will put the order in.

## 2020-03-15 NOTE — Progress Notes (Signed)
Called patient know that he has hyperthyroidism, an overactive thyroid and he will need referral to endocrinology.  Please refer him to Dr. Dorris Fetch. Pt will be looking for a phone call from Dr Dorris Fetch office to set up appt.

## 2020-03-20 ENCOUNTER — Ambulatory Visit (HOSPITAL_COMMUNITY)
Admission: RE | Admit: 2020-03-20 | Discharge: 2020-03-20 | Disposition: A | Discharge status: Home / Self Care | Payer: Medicare Other | Source: Ambulatory Visit | Attending: Nurse Practitioner | Admitting: Nurse Practitioner

## 2020-03-20 ENCOUNTER — Other Ambulatory Visit: Payer: Self-pay

## 2020-03-20 DIAGNOSIS — J439 Emphysema, unspecified: Secondary | ICD-10-CM | POA: Diagnosis not present

## 2020-03-20 DIAGNOSIS — Z122 Encounter for screening for malignant neoplasm of respiratory organs: Secondary | ICD-10-CM

## 2020-03-22 NOTE — Progress Notes (Signed)
Patient called.  PT WAS AT WORK; ASK TO CALL WIFE .RESULTS WAS GIVEN MESSAGE . HUSBAND ASK TO CALL HER BACK ON HOME PHONE TO LET HER TAKE MESSAGE.

## 2020-03-22 NOTE — Progress Notes (Signed)
Please call the patient and let him know that the CT scan of the chest did not show any evidence of cancer.  We will follow up with him on the next visit to discuss in more detail.

## 2020-03-23 ENCOUNTER — Other Ambulatory Visit (INDEPENDENT_AMBULATORY_CARE_PROVIDER_SITE_OTHER): Payer: Self-pay | Admitting: Nurse Practitioner

## 2020-03-23 DIAGNOSIS — E785 Hyperlipidemia, unspecified: Secondary | ICD-10-CM

## 2020-03-23 DIAGNOSIS — K2100 Gastro-esophageal reflux disease with esophagitis, without bleeding: Secondary | ICD-10-CM

## 2020-03-23 DIAGNOSIS — I10 Essential (primary) hypertension: Secondary | ICD-10-CM

## 2020-03-27 ENCOUNTER — Telehealth: Payer: Self-pay | Admitting: Gastroenterology

## 2020-03-27 ENCOUNTER — Encounter: Payer: Self-pay | Admitting: Internal Medicine

## 2020-03-27 ENCOUNTER — Emergency Department (HOSPITAL_COMMUNITY): Payer: Medicare Other

## 2020-03-27 ENCOUNTER — Emergency Department (HOSPITAL_COMMUNITY)
Admission: EM | Admit: 2020-03-27 | Discharge: 2020-03-27 | Disposition: A | Discharge status: Home / Self Care | Payer: Medicare Other | Attending: Emergency Medicine | Admitting: Emergency Medicine

## 2020-03-27 ENCOUNTER — Other Ambulatory Visit: Payer: Self-pay

## 2020-03-27 ENCOUNTER — Encounter (HOSPITAL_COMMUNITY): Payer: Self-pay | Admitting: Emergency Medicine

## 2020-03-27 DIAGNOSIS — I959 Hypotension, unspecified: Secondary | ICD-10-CM | POA: Diagnosis not present

## 2020-03-27 DIAGNOSIS — Z743 Need for continuous supervision: Secondary | ICD-10-CM | POA: Diagnosis not present

## 2020-03-27 DIAGNOSIS — R109 Unspecified abdominal pain: Secondary | ICD-10-CM | POA: Diagnosis not present

## 2020-03-27 DIAGNOSIS — I10 Essential (primary) hypertension: Secondary | ICD-10-CM | POA: Insufficient documentation

## 2020-03-27 DIAGNOSIS — K625 Hemorrhage of anus and rectum: Secondary | ICD-10-CM | POA: Insufficient documentation

## 2020-03-27 DIAGNOSIS — I2510 Atherosclerotic heart disease of native coronary artery without angina pectoris: Secondary | ICD-10-CM | POA: Insufficient documentation

## 2020-03-27 DIAGNOSIS — R112 Nausea with vomiting, unspecified: Secondary | ICD-10-CM | POA: Diagnosis not present

## 2020-03-27 DIAGNOSIS — Z79899 Other long term (current) drug therapy: Secondary | ICD-10-CM | POA: Diagnosis not present

## 2020-03-27 DIAGNOSIS — K920 Hematemesis: Secondary | ICD-10-CM | POA: Diagnosis not present

## 2020-03-27 DIAGNOSIS — R52 Pain, unspecified: Secondary | ICD-10-CM | POA: Diagnosis not present

## 2020-03-27 DIAGNOSIS — F17210 Nicotine dependence, cigarettes, uncomplicated: Secondary | ICD-10-CM | POA: Diagnosis not present

## 2020-03-27 LAB — COMPREHENSIVE METABOLIC PANEL
ALT: 119 U/L — ABNORMAL HIGH (ref 0–44)
AST: 55 U/L — ABNORMAL HIGH (ref 15–41)
Albumin: 4.2 g/dL (ref 3.5–5.0)
Alkaline Phosphatase: 84 U/L (ref 38–126)
Anion gap: 14 (ref 5–15)
BUN: 36 mg/dL — ABNORMAL HIGH (ref 8–23)
CO2: 16 mmol/L — ABNORMAL LOW (ref 22–32)
Calcium: 9.9 mg/dL (ref 8.9–10.3)
Chloride: 111 mmol/L (ref 98–111)
Creatinine, Ser: 1.5 mg/dL — ABNORMAL HIGH (ref 0.61–1.24)
GFR calc Af Amer: 53 mL/min — ABNORMAL LOW (ref >60)
GFR calc non Af Amer: 46 mL/min — ABNORMAL LOW (ref >60)
Glucose, Bld: 156 mg/dL — ABNORMAL HIGH (ref 70–99)
Potassium: 3.6 mmol/L (ref 3.5–5.1)
Sodium: 141 mmol/L (ref 135–145)
Total Bilirubin: 1.2 mg/dL (ref 0.3–1.2)
Total Protein: 8.4 g/dL — ABNORMAL HIGH (ref 6.5–8.1)

## 2020-03-27 LAB — CBC
HCT: 42.1 % (ref 39.0–52.0)
Hemoglobin: 13.7 g/dL (ref 13.0–17.0)
MCH: 28.4 pg (ref 26.0–34.0)
MCHC: 32.5 g/dL (ref 30.0–36.0)
MCV: 87.3 fL (ref 80.0–100.0)
Platelets: 211 10*3/uL (ref 150–400)
RBC: 4.82 MIL/uL (ref 4.22–5.81)
RDW: 13.2 % (ref 11.5–15.5)
WBC: 10.2 10*3/uL (ref 4.0–10.5)
nRBC: 0 % (ref 0.0–0.2)

## 2020-03-27 LAB — LIPASE, BLOOD: Lipase: 42 U/L (ref 11–51)

## 2020-03-27 LAB — POC OCCULT BLOOD, ED: Fecal Occult Bld: POSITIVE — AB

## 2020-03-27 LAB — TYPE AND SCREEN
ABO/RH(D): A POS
Antibody Screen: NEGATIVE

## 2020-03-27 MED ORDER — SODIUM CHLORIDE 0.9 % IV BOLUS
500.0000 mL | Freq: Once | INTRAVENOUS | Status: AC
  Administered 2020-03-27: 500 mL via INTRAVENOUS

## 2020-03-27 MED ORDER — ALUM & MAG HYDROXIDE-SIMETH 200-200-20 MG/5ML PO SUSP
30.0000 mL | Freq: Once | ORAL | Status: AC
  Administered 2020-03-27: 30 mL via ORAL
  Filled 2020-03-27: qty 30

## 2020-03-27 MED ORDER — PANTOPRAZOLE SODIUM 40 MG IV SOLR
40.0000 mg | Freq: Once | INTRAVENOUS | Status: AC
  Administered 2020-03-27: 40 mg via INTRAVENOUS
  Filled 2020-03-27: qty 40

## 2020-03-27 MED ORDER — LIDOCAINE VISCOUS HCL 2 % MT SOLN
15.0000 mL | Freq: Once | OROMUCOSAL | Status: AC
  Administered 2020-03-27: 15 mL via ORAL
  Filled 2020-03-27: qty 15

## 2020-03-27 MED ORDER — IOHEXOL 300 MG/ML  SOLN
100.0000 mL | Freq: Once | INTRAMUSCULAR | Status: AC | PRN
  Administered 2020-03-27: 100 mL via INTRAVENOUS

## 2020-03-27 MED ORDER — ONDANSETRON HCL 4 MG/2ML IJ SOLN: 4.0000 mg | Freq: Once | INTRAMUSCULAR | Status: DC

## 2020-03-27 MED ORDER — HYDROCORTISONE ACETATE 25 MG RE SUPP: 25.0000 mg | Freq: Four times a day (QID) | RECTAL | 0 refills | Status: DC

## 2020-03-27 MED ORDER — ONDANSETRON HCL 4 MG PO TABS
4.0000 mg | ORAL_TABLET | Freq: Four times a day (QID) | ORAL | 0 refills | Status: DC | PRN
Start: 2020-03-27 — End: 2020-04-13

## 2020-03-27 MED ORDER — HYDROCORTISONE ACETATE 25 MG RE SUPP: 25.0000 mg | Freq: Four times a day (QID) | RECTAL | 0 refills | Status: AC

## 2020-03-27 MED ORDER — PROMETHAZINE HCL 25 MG/ML IJ SOLN
12.5000 mg | Freq: Once | INTRAMUSCULAR | Status: AC
  Administered 2020-03-27: 12.5 mg via INTRAVENOUS
  Filled 2020-03-27: qty 1

## 2020-03-27 NOTE — Telephone Encounter (Signed)
Spoke with pt and his spouse. Pt is aware of recommendations and will go to the ED if heavy bleeding starts.

## 2020-03-27 NOTE — Telephone Encounter (Signed)
CALLED BY ED PA. PT SEEN FOR BRBPR x3(SML VOLUME), MILD ABDOMINAL PAIN. LAST TCS 2017: HEMORRHOIDS. STARTED ON ELIQUIS JAN 2021. BP/Hb STABLE.  PLAN: 1. HOLD ELIQUIS FOR 24 HRS 2. ADD ANUSOL SUPP QID FOR 7 DAYS. 3. OPV W/ RGA IN 2-3 WEEKS. 4. RETURN TO ED FOR HEAVY BLEEDING.

## 2020-03-27 NOTE — ED Notes (Signed)
Patient vomiting after receiving meds and water.
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
## 2020-03-27 NOTE — Telephone Encounter (Signed)
Communication noted.  

## 2020-03-27 NOTE — ED Triage Notes (Addendum)
Per EMS, cbg 132, pt reports emesis/ dark blood in stool/ abd pain since 1230 this am. Pt reports is currently on blood thinner. Pt reports diarrhea x1 month. 4mg  of zofran administered en route with EMS. Pt actively vomiting during triage.

## 2020-03-27 NOTE — Discharge Instructions (Addendum)
You have been seen today for rectal bleeding. Please read and follow all provided instructions. Return to the emergency room for worsening condition or new concerning symptoms.    The CT scan showed you have cysts on your liver and both kidneys.  1. Medications:  Prescription sent to your pharmacy for Anusol.  This is a suppository you can insert into your rectum to help with the pain from your hemorrhoids.  Please use as prescribed and if needed. -Prescription also sent for Zofran.  This is used to treat nausea.  Please hold your Eliquis for 24 hours.  You can start taking it again tomorrow.  Continue other home medications Take medications as prescribed. Please review all of the medicines and only take them if you do not have an allergy to them.   2. Treatment: rest, drink plenty of fluids  3. Follow Up:  Please follow up with primary care provider in 2-5 days to have your blood work rechecked.    -You need to have your kidney function rechecked as your creatinine was elevated today.   -You need to follow-up with Dr. Oneida Alar the gastroenterologist.  Call her office to schedule the next available appointment.  -You also needs to follow up with your kidney doctor to talk about the cysts seen on your kidneys.

## 2020-04-02 ENCOUNTER — Other Ambulatory Visit (INDEPENDENT_AMBULATORY_CARE_PROVIDER_SITE_OTHER): Payer: Self-pay | Admitting: Nurse Practitioner

## 2020-04-02 DIAGNOSIS — E785 Hyperlipidemia, unspecified: Secondary | ICD-10-CM

## 2020-04-02 DIAGNOSIS — I10 Essential (primary) hypertension: Secondary | ICD-10-CM

## 2020-04-02 DIAGNOSIS — K2100 Gastro-esophageal reflux disease with esophagitis, without bleeding: Secondary | ICD-10-CM

## 2020-04-11 DIAGNOSIS — R0902 Hypoxemia: Secondary | ICD-10-CM | POA: Diagnosis not present

## 2020-04-11 DIAGNOSIS — I2584 Coronary atherosclerosis due to calcified coronary lesion: Secondary | ICD-10-CM | POA: Diagnosis not present

## 2020-04-11 DIAGNOSIS — N179 Acute kidney failure, unspecified: Secondary | ICD-10-CM | POA: Diagnosis not present

## 2020-04-11 DIAGNOSIS — Z20822 Contact with and (suspected) exposure to covid-19: Secondary | ICD-10-CM | POA: Diagnosis not present

## 2020-04-11 DIAGNOSIS — I10 Essential (primary) hypertension: Secondary | ICD-10-CM | POA: Diagnosis not present

## 2020-04-11 DIAGNOSIS — N281 Cyst of kidney, acquired: Secondary | ICD-10-CM | POA: Diagnosis not present

## 2020-04-11 DIAGNOSIS — I2510 Atherosclerotic heart disease of native coronary artery without angina pectoris: Secondary | ICD-10-CM | POA: Diagnosis not present

## 2020-04-11 DIAGNOSIS — Z79899 Other long term (current) drug therapy: Secondary | ICD-10-CM | POA: Diagnosis not present

## 2020-04-11 DIAGNOSIS — Z743 Need for continuous supervision: Secondary | ICD-10-CM | POA: Diagnosis not present

## 2020-04-11 DIAGNOSIS — R42 Dizziness and giddiness: Secondary | ICD-10-CM | POA: Diagnosis not present

## 2020-04-11 DIAGNOSIS — K7689 Other specified diseases of liver: Secondary | ICD-10-CM | POA: Diagnosis not present

## 2020-04-11 DIAGNOSIS — E860 Dehydration: Secondary | ICD-10-CM | POA: Diagnosis not present

## 2020-04-11 DIAGNOSIS — Z7901 Long term (current) use of anticoagulants: Secondary | ICD-10-CM | POA: Diagnosis not present

## 2020-04-11 DIAGNOSIS — R000 Tachycardia, unspecified: Secondary | ICD-10-CM | POA: Diagnosis not present

## 2020-04-11 DIAGNOSIS — R112 Nausea with vomiting, unspecified: Secondary | ICD-10-CM | POA: Diagnosis not present

## 2020-04-11 DIAGNOSIS — E785 Hyperlipidemia, unspecified: Secondary | ICD-10-CM | POA: Diagnosis not present

## 2020-04-11 DIAGNOSIS — Z7982 Long term (current) use of aspirin: Secondary | ICD-10-CM | POA: Diagnosis not present

## 2020-04-11 DIAGNOSIS — Z955 Presence of coronary angioplasty implant and graft: Secondary | ICD-10-CM | POA: Diagnosis not present

## 2020-04-11 DIAGNOSIS — I252 Old myocardial infarction: Secondary | ICD-10-CM | POA: Diagnosis not present

## 2020-04-11 DIAGNOSIS — I255 Ischemic cardiomyopathy: Secondary | ICD-10-CM | POA: Diagnosis not present

## 2020-04-11 DIAGNOSIS — R197 Diarrhea, unspecified: Secondary | ICD-10-CM | POA: Diagnosis not present

## 2020-04-11 DIAGNOSIS — K2100 Gastro-esophageal reflux disease with esophagitis, without bleeding: Secondary | ICD-10-CM | POA: Diagnosis not present

## 2020-04-11 DIAGNOSIS — I700 Atherosclerosis of aorta: Secondary | ICD-10-CM | POA: Diagnosis not present

## 2020-04-11 DIAGNOSIS — E0590 Thyrotoxicosis, unspecified without thyrotoxic crisis or storm: Secondary | ICD-10-CM | POA: Diagnosis not present

## 2020-04-12 DIAGNOSIS — R112 Nausea with vomiting, unspecified: Secondary | ICD-10-CM | POA: Insufficient documentation

## 2020-04-12 DIAGNOSIS — R4702 Dysphasia: Secondary | ICD-10-CM | POA: Insufficient documentation

## 2020-04-12 DIAGNOSIS — E860 Dehydration: Secondary | ICD-10-CM | POA: Insufficient documentation

## 2020-04-13 ENCOUNTER — Ambulatory Visit: Payer: Self-pay | Admitting: "Endocrinology

## 2020-04-13 ENCOUNTER — Ambulatory Visit (INDEPENDENT_AMBULATORY_CARE_PROVIDER_SITE_OTHER): Payer: Medicare Other | Admitting: "Endocrinology

## 2020-04-13 ENCOUNTER — Other Ambulatory Visit: Payer: Self-pay

## 2020-04-13 ENCOUNTER — Encounter: Payer: Self-pay | Admitting: "Endocrinology

## 2020-04-13 VITALS — BP 151/66 | HR 69 | Ht 65.0 in | Wt 116.4 lb

## 2020-04-13 DIAGNOSIS — E0590 Thyrotoxicosis, unspecified without thyrotoxic crisis or storm: Secondary | ICD-10-CM

## 2020-04-13 MED ORDER — ATORVASTATIN CALCIUM 40 MG PO TABS
40.00 | ORAL_TABLET | ORAL | Status: DC
Start: 2020-04-13 — End: 2020-04-13

## 2020-04-13 MED ORDER — APIXABAN 5 MG PO TABS
5.00 | ORAL_TABLET | ORAL | Status: DC
Start: 2020-04-13 — End: 2020-04-13

## 2020-04-13 MED ORDER — AMLODIPINE BESYLATE 5 MG PO TABS
10.00 | ORAL_TABLET | ORAL | Status: DC
Start: 2020-04-14 — End: 2020-04-13

## 2020-04-13 MED ORDER — METOPROLOL SUCCINATE ER 50 MG PO TB24
100.00 | ORAL_TABLET | ORAL | Status: DC
Start: 2020-04-14 — End: 2020-04-13

## 2020-04-13 MED ORDER — LISINOPRIL 20 MG PO TABS
20.00 | ORAL_TABLET | ORAL | Status: DC
Start: 2020-04-14 — End: 2020-04-13

## 2020-04-13 MED ORDER — DEXTROSE-SODIUM CHLORIDE 5-0.9 % IV SOLN
100.00 | INTRAVENOUS | Status: DC
Start: ? — End: 2020-04-13

## 2020-04-13 MED ORDER — ASPIRIN 81 MG PO TBEC
81.00 | DELAYED_RELEASE_TABLET | ORAL | Status: DC
Start: 2020-04-14 — End: 2020-04-13

## 2020-04-13 NOTE — Progress Notes (Signed)
04/13/2020     Endocrinology Consult Note    Subjective:    Patient ID: Ronald Mcdowell, male    DOB: January 19, 1946, PCP Doree Albee, MD.   Past Medical History:  Diagnosis Date  . Anemia   . Coronary artery disease    a. h/o MI in 2000 w/ prior LCX stenting;  b. 7.2014 Abnl Cardiolite, EF 41%, mod-large inferolat scar;  c. 07/2013 Cath/PCI: LM nl, LAD 10-20, D1 40-50p, LCX 95-99 @ distal stent margin (2.5x20 Promus Premier DES), RCA dom, 40p, 32m, 70d, EF 35-40%.  . Elevated cholesterol   . GERD (gastroesophageal reflux disease)   . Hemorrhoids   . Hypertension   . Ischemic cardiomyopathy    a. 07/2013 EF 35-40% by LV gram.  . Myocardial infarction ([place])   . Reflux esophagitis   . Spondylosis of cervical joint    C3-4, C6-7  . Tobacco user   . Vitamin D deficiency disease 08/24/2019    Past Surgical History:  Procedure Laterality Date  . BIOPSY  01/09/2018   Procedure: BIOPSY;  Surgeon: Daneil Dolin, MD;  Location: [facility];  Service: Endoscopy;;  esophageal biopsies  . CARDIAC CATHETERIZATION    . COLONOSCOPY    . COLONOSCOPY N/A 01/25/2016   RMR: normal ileocolonoscopy ( anal canal hemorrhoids)  . CORONARY ANGIOPLASTY WITH STENT PLACEMENT  08/23/2013   mid circumflex  DES    by Dr Martinique  . CORONARY STENT PLACEMENT  2000  . ESOPHAGOGASTRODUODENOSCOPY N/A 01/25/2016   RMR: Reflux esophagitits. Cervical web with passage of the scope. Status post esophageal biosy. Hiatal hernia.   [name] ESOPHAGOGASTRODUODENOSCOPY N/A 01/09/2018   Procedure: ESOPHAGOGASTRODUODENOSCOPY (EGD);  Surgeon: Daneil Dolin, MD;  Location: [facility];  Service: Endoscopy;  Laterality: N/A;  1:00pm  . LEFT HEART CATHETERIZATION WITH CORONARY ANGIOGRAM N/A 08/23/2013   Procedure: LEFT HEART CATHETERIZATION WITH CORONARY ANGIOGRAM;  Surgeon: Peter M Martinique, MD;  Location: [facility] CATH LAB;  Service: Cardiovascular;  Laterality: N/A;  . [name] DILATION N/A 01/09/2018   Procedure: [name]  DILATION;  Surgeon: Daneil Dolin, MD;  Location: [facility];  Service: Endoscopy;  Laterality: N/A;  . TRANSURETHRAL RESECTION OF PROSTATE  01/09/2012   Procedure: TRANSURETHRAL RESECTION OF THE PROSTATE (TURP);  Surgeon: Marissa Nestle, MD;  Location: [facility];  Service: Urology;  Laterality: N/A;    Social History   Socioeconomic History  . Marital status: Married    Spouse name: [name]  . Number of children: Not on file  . Years of education: 11th grade  . Highest education level: 11th grade  Occupational History  . Occupation: Librarian, academic  Tobacco Use  . Smoking status: Current Every Day Smoker    Packs/day: 1.00    Years: 53.00    Pack years: 53.00    Types: Cigarettes    Start date: 12/09/1965  . Smokeless tobacco: Never Used  . Tobacco comment: 3/4 pack of cigarettes daily; smoked since 74 years old  Substance and Sexual Activity  . Alcohol use: No    Alcohol/week: 0.0 standard drinks  . Drug use: No  . Sexual activity: Yes    Birth control/protection: None  Other Topics Concern  . 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   Social Determinants of Health   Financial Resource Strain:   . Difficulty of Paying Living Expenses:   Food Insecurity:   .  Worried About Charity fundraiser in the Last Year:   . Arboriculturist in the Last Year:   Transportation Needs:   . Film/video editor (Medical):   [name] Lack of Transportation (Non-Medical):   Physical Activity:   . Days of Exercise per Week:   . Minutes of Exercise per Session:   Stress:   . Feeling of Stress :   Social Connections:   . Frequency of Communication with Friends and Family:   . Frequency of Social Gatherings with Friends and Family:   . Attends Religious Services:   . Active Member of Clubs or Organizations:   . Attends Archivist Meetings:   [name] Marital Status:     Family History   Problem Relation Age of Onset  . Early death Mother        childbirth  . Heart attack Father   . Cancer Father   . Heart disease Brother        CABG  . Heart disease Brother        slight heart attack  . Anesthesia problems Neg Hx   . Hypotension Neg Hx   . Malignant hyperthermia Neg Hx   . Pseudochol deficiency Neg Hx   . Colon cancer Neg Hx   . Liver disease Neg Hx   . Gastric cancer Neg Hx   . Esophageal cancer Neg Hx     Outpatient Encounter Medications as of 04/13/2020  Medication Sig  . amLODipine (NORVASC) 5 MG tablet TAKE 1 TABLET BY MOUTH  DAILY (Patient taking differently: Take 5 mg by mouth daily. )  . apixaban (ELIQUIS) 5 MG TABS tablet TAKE 1 TABLET(5 MG) BY MOUTH TWICE DAILY (Patient taking differently: Take 5 mg by mouth 2 (two) times daily. TAKE 1 TABLET(5 MG) BY MOUTH TWICE DAILY)  . atorvastatin (LIPITOR) 40 MG tablet TAKE 1 TABLET BY MOUTH  DAILY  . bisoprolol (ZEBETA) 5 MG tablet TAKE 1 TABLET BY MOUTH  DAILY (Patient taking differently: Take 5 mg by mouth daily. )  . Cholecalciferol (VITAMIN D-3) 125 MCG (5000 UT) TABS Take 1 tablet by mouth daily.  [name] lisinopril (ZESTRIL) 20 MG tablet TAKE 1 TABLET BY MOUTH  DAILY (Patient taking differently: Take 20 mg by mouth daily. )  . nitroGLYCERIN (NITROSTAT) 0.4 MG SL tablet Place 1 tablet (0.4 mg total) under the tongue every 5 (five) minutes as needed for chest pain.  . pantoprazole (PROTONIX) 40 MG tablet TAKE 1 TABLET BY MOUTH  DAILY (Patient taking differently: Take 40 mg by mouth daily. )  . potassium chloride (KLOR-CON) 10 MEQ tablet Take 2 tablets (20 mEq total) by mouth daily.  . [DISCONTINUED] amLODipine (NORVASC) 5 MG tablet Take 1 tablet (5 mg total) by mouth daily.  . [DISCONTINUED] atorvastatin (LIPITOR) 40 MG tablet Take 1 tablet (40 mg total) by mouth at bedtime. Reported on 11/30/2015  . [DISCONTINUED] atorvastatin (LIPITOR) 40 MG tablet TAKE 1 TABLET(40 MG) BY MOUTH DAILY (Patient taking differently: Take  40 mg by mouth daily. )  . [DISCONTINUED] bisoprolol (ZEBETA) 5 MG tablet TAKE 1 TABLET BY MOUTH DAILY  . [DISCONTINUED] lisinopril (ZESTRIL) 20 MG tablet Take 20 mg by mouth daily.  . [DISCONTINUED] ondansetron (ZOFRAN) 4 MG tablet Take 1 tablet (4 mg total) by mouth every 6 (six) hours as needed for nausea or vomiting.  . [DISCONTINUED] amLODipine (NORVASC) tablet   . [DISCONTINUED] apixaban (ELIQUIS) tablet   . [DISCONTINUED] aspirin EC tablet   . [  DISCONTINUED] atorvastatin (LIPITOR) tablet   . [DISCONTINUED] dextrose 5 % and 0.9 % NaCl infusion   . [DISCONTINUED] lisinopril (ZESTRIL) tablet   . [DISCONTINUED] metoprolol succinate (TOPROL-XL) 24 hr tablet    No facility-administered encounter medications on file as of 04/13/2020.    ALLERGIES: No Known Allergies  VACCINATION STATUS: Immunization History  Administered Date(s) Administered  . Influenza,inj,Quad PF,6+ Mos 08/24/2013, 11/04/2017  . Influenza-Unspecified 09/26/2019  . Moderna SARS-COVID-2 Vaccination 02/15/2020  . Pneumococcal Conjugate-13 04/22/2019  . Td 02/23/2017     HPI  GARNELL EXNER is 74 y.o. male who presents today with a medical history as above. he is being seen in consultation for hyperthyroidism requested by Doree Albee, MD.  he has been dealing with symptoms of weight loss of approximately 40 pounds, anxiety, frequent bowel movements, and heat intolerance for 3 to 6 months. These symptoms are progressively worsening and troubling to him. his most recent thyroid labs revealed significant elevation of thyroid hormone and suppressed TSH.  he denies dysphagia, choking, shortness of breath, no recent voice change.    he denies family history of thyroid dysfunction, denies family hx of thyroid cancer.  He denies any exposure to amiodarone. he denies personal history of goiter. he is not on any anti-thyroid medications nor on any thyroid hormone supplements. he  is willing to proceed with appropriate  work up and therapy for thyrotoxicosis.                           Review of systems  Constitutional: + weight loss, + fatigue, + subjective hyperthermia Eyes: no blurry vision, - xerophthalmia ENT: no sore throat, no nodules palpated in throat, no dysphagia/odynophagia, nor hoarseness Cardiovascular: no Chest Pain, no Shortness of Breath, +  palpitations, no leg swelling Respiratory: no cough, no SOB Gastrointestinal: no Nausea, no Vomiting, no Diarhhea Musculoskeletal: no muscle/joint aches Skin: no rashes Neurological: +  tremors, no numbness, no tingling, no dizziness Psychiatric: no depression, +  anxiety   Objective:    BP (!) 151/66   Pulse 69   Ht 5\' 5"  (1.651 m)   Wt 116 lb 6.4 oz (52.8 kg)   BMI 19.37 kg/m   Wt Readings from Last 3 Encounters:  04/13/20 116 lb 6.4 oz (52.8 kg)  03/27/20 130 lb 1.1 oz (59 kg)  03/14/20 132 lb 3.2 oz (60 kg)                                                Physical exam  Constitutional: Body mass index is 19.37 kg/m., not in acute distress, + anxious state of mind Eyes: PERRLA, EOMI, - exophthalmos ENT: moist mucous membranes, -  thyromegaly, no cervical lymphadenopathy Cardiovascular: + active precordial activity, -tachycardic (patient is on a beta-blocker),  no Murmur/Rubs/Gallops Respiratory:  adequate breathing efforts, no gross chest deformity, Clear to auscultation bilaterally Gastrointestinal: abdomen soft, Non -tender, No distension, Bowel Sounds present Musculoskeletal: no gross deformities, strength intact in all four extremities Skin: moist, warm, no rashes Neurological:  ++  tremor with outstretched hands,  + Deep Tendon Reflexes  on both lower extremities.   CMP     Component Value Date/Time   NA 141 03/27/2020 0754   K 3.6 03/27/2020 0754   CL 111 03/27/2020 0754   CO2 16 (L) 03/27/2020 [id number]  GLUCOSE 156 (H) 03/27/2020 0754   BUN 36 (H) 03/27/2020 0754   CREATININE 1.50 (H) 03/27/2020 0754   CREATININE 0.93  03/14/2020 1341   CALCIUM 9.9 03/27/2020 0754   PROT 8.4 (H) 03/27/2020 0754   ALBUMIN 4.2 03/27/2020 0754   AST 55 (H) 03/27/2020 0754   ALT 119 (H) 03/27/2020 0754   ALKPHOS 84 03/27/2020 0754   BILITOT 1.2 03/27/2020 0754   GFRNONAA 46 (L) 03/27/2020 0754   GFRNONAA 82 03/14/2020 1341   GFRAA 53 (L) 03/27/2020 0754   GFRAA 95 03/14/2020 1341     CBC    Component Value Date/Time   WBC 10.2 03/27/2020 0754   RBC 4.82 03/27/2020 0754   HGB 13.7 03/27/2020 0754   HCT 42.1 03/27/2020 0754   PLT 211 03/27/2020 0754   MCV 87.3 03/27/2020 0754   MCH 28.4 03/27/2020 0754   MCHC 32.5 03/27/2020 0754   RDW 13.2 03/27/2020 0754   LYMPHSABS 3,434 03/12/2016 0707   MONOABS 808 03/12/2016 0707   EOSABS 303 03/12/2016 0707   BASOSABS 0 03/12/2016 0707     Diabetic Labs (most recent): Lab Results  Component Value Date   HGBA1C 5.9 (H) 04/10/2015   HGBA1C 5.5 06/06/2013    Lipid Panel     Component Value Date/Time   CHOL 131 11/04/2017 1417   TRIG 80 11/04/2017 1417   HDL 36 (L) 11/04/2017 1417   CHOLHDL 3.6 11/04/2017 1417   VLDL 15 03/12/2016 0704   LDLCALC 79 11/04/2017 1417     Lab Results  Component Value Date   TSH 0.01 (L) 03/14/2020   TSH 0.674 04/10/2015   TSH 0.455 06/06/2013        Assessment & Plan:   1. Hyperthyroidism  he is being seen at a kind request of Gosrani, Nimish C, MD. his history and most recent labs are reviewed, and he was examined clinically. Subjective and objective findings are consistent with thyrotoxicosis likely from primary hyperthyroidism. The potential risks of untreated thyrotoxicosis and the need for definitive therapy have been discussed in detail with him, and he agrees to proceed with diagnostic workup and treatment plan.   -He will need confirmatory thyroid uptake and scan will be scheduled to be done as soon as possible.   Options of therapy are discussed with him.  We discussed the option of treating it with  medications including methimazole or PTU which may have side effects including rash, transaminitis, and bone marrow suppression.  We  also discussed the option of definitive therapy with RAI ablation of the thyroid.   If  he is found to have primary hyperthyroidism from Graves' disease , toxic multinodular goiter or toxic nodular goiter the preferred modality of treatment would be I-131 thyroid ablation.    -Patient is made aware of the high likelihood of post ablative hypothyroidism with subsequent need for lifelong thyroid hormone replacement. heunderstands this outcome  and he is  willing to proceed.  Although surgery is one other choice of treatment in some cases, in his case surgery is not a good fit for presentation with only mild goiter.    he will return in 5-7 days for treatment decision.  His pulse rate is controlled at 69, on beta-blocker. -I did not initiate any prescription for him today.  -Patient is advised to maintain close follow up with Doree Albee, MD for primary care needs.   - Time spent with the patient: 50 minutes, of which >50% was spent in obtaining  information about his symptoms, reviewing his previous labs, evaluations, and treatments, counseling him about his hyperthyroidism, and developing a plan to confirm the diagnosis and long term treatment as necessary. Please refer to " Patient Self Inventory" in the Media  tab for reviewed elements of pertinent patient history.  Ronald Mcdowell participated in the discussions, expressed understanding, and voiced agreement with the above plans.  All questions were answered to his satisfaction. he is encouraged to contact clinic should he have any questions or concerns prior to his return visit.   Follow up plan: Return in about 1 week (around 04/20/2020) for F/U with Thyroid Uptake and Scan.   Thank you for involving me in the care of this pleasant patient, and I will continue to update you with his progress.  Glade Lloyd,  MD [facility] Phone: [phone]  Fax: [phone]   04/13/2020, 6:11 PM  This note was partially dictated with voice recognition software. Similar sounding words can be transcribed inadequately or may not  be corrected upon review.

## 2020-04-14 ENCOUNTER — Telehealth: Payer: Self-pay | Admitting: "Endocrinology

## 2020-04-14 NOTE — Telephone Encounter (Signed)
Ok, no problem!

## 2020-04-14 NOTE — Telephone Encounter (Signed)
Dr Parks Neptune from [facility] called and said they did get him scheduled for his uptake and scan but the soonest they could do it is June 2nd & 3rd. I will have to move the follow up appt.

## 2020-04-16 ENCOUNTER — Emergency Department (HOSPITAL_COMMUNITY)
Admission: EM | Admit: 2020-04-16 | Discharge: 2020-04-17 | Disposition: A | Discharge status: Home / Self Care | Payer: Medicare Other | Attending: Emergency Medicine | Admitting: Emergency Medicine

## 2020-04-16 ENCOUNTER — Encounter (HOSPITAL_COMMUNITY): Payer: Self-pay | Admitting: Emergency Medicine

## 2020-04-16 ENCOUNTER — Other Ambulatory Visit: Payer: Self-pay

## 2020-04-16 DIAGNOSIS — Z955 Presence of coronary angioplasty implant and graft: Secondary | ICD-10-CM | POA: Insufficient documentation

## 2020-04-16 DIAGNOSIS — Z7901 Long term (current) use of anticoagulants: Secondary | ICD-10-CM | POA: Insufficient documentation

## 2020-04-16 DIAGNOSIS — I10 Essential (primary) hypertension: Secondary | ICD-10-CM | POA: Insufficient documentation

## 2020-04-16 DIAGNOSIS — E860 Dehydration: Secondary | ICD-10-CM | POA: Insufficient documentation

## 2020-04-16 DIAGNOSIS — R531 Weakness: Secondary | ICD-10-CM | POA: Diagnosis present

## 2020-04-16 DIAGNOSIS — R1110 Vomiting, unspecified: Secondary | ICD-10-CM | POA: Diagnosis not present

## 2020-04-16 DIAGNOSIS — R634 Abnormal weight loss: Secondary | ICD-10-CM | POA: Diagnosis not present

## 2020-04-16 DIAGNOSIS — R197 Diarrhea, unspecified: Secondary | ICD-10-CM | POA: Diagnosis not present

## 2020-04-16 DIAGNOSIS — R5383 Other fatigue: Secondary | ICD-10-CM | POA: Insufficient documentation

## 2020-04-16 DIAGNOSIS — Z79899 Other long term (current) drug therapy: Secondary | ICD-10-CM | POA: Insufficient documentation

## 2020-04-16 DIAGNOSIS — F17210 Nicotine dependence, cigarettes, uncomplicated: Secondary | ICD-10-CM | POA: Insufficient documentation

## 2020-04-16 DIAGNOSIS — I252 Old myocardial infarction: Secondary | ICD-10-CM | POA: Insufficient documentation

## 2020-04-16 DIAGNOSIS — Z8673 Personal history of transient ischemic attack (TIA), and cerebral infarction without residual deficits: Secondary | ICD-10-CM | POA: Diagnosis not present

## 2020-04-16 DIAGNOSIS — R112 Nausea with vomiting, unspecified: Secondary | ICD-10-CM | POA: Diagnosis not present

## 2020-04-16 DIAGNOSIS — I2510 Atherosclerotic heart disease of native coronary artery without angina pectoris: Secondary | ICD-10-CM | POA: Insufficient documentation

## 2020-04-16 LAB — URINALYSIS, ROUTINE W REFLEX MICROSCOPIC
Bilirubin Urine: NEGATIVE
Glucose, UA: NEGATIVE mg/dL
Ketones, ur: NEGATIVE mg/dL
Nitrite: NEGATIVE
Protein, ur: NEGATIVE mg/dL
Specific Gravity, Urine: 1.012 (ref 1.005–1.030)
pH: 5 (ref 5.0–8.0)

## 2020-04-16 LAB — COMPREHENSIVE METABOLIC PANEL
ALT: 135 U/L — ABNORMAL HIGH (ref 0–44)
AST: 54 U/L — ABNORMAL HIGH (ref 15–41)
Albumin: 3.5 g/dL (ref 3.5–5.0)
Alkaline Phosphatase: 76 U/L (ref 38–126)
Anion gap: 12 (ref 5–15)
BUN: 52 mg/dL — ABNORMAL HIGH (ref 8–23)
CO2: 15 mmol/L — ABNORMAL LOW (ref 22–32)
Calcium: 9.8 mg/dL (ref 8.9–10.3)
Chloride: 115 mmol/L — ABNORMAL HIGH (ref 98–111)
Creatinine, Ser: 1.46 mg/dL — ABNORMAL HIGH (ref 0.61–1.24)
GFR calc Af Amer: 55 mL/min — ABNORMAL LOW (ref >60)
GFR calc non Af Amer: 47 mL/min — ABNORMAL LOW (ref >60)
Glucose, Bld: 81 mg/dL (ref 70–99)
Potassium: 5 mmol/L (ref 3.5–5.1)
Sodium: 142 mmol/L (ref 135–145)
Total Bilirubin: 1.3 mg/dL — ABNORMAL HIGH (ref 0.3–1.2)
Total Protein: 7.3 g/dL (ref 6.5–8.1)

## 2020-04-16 LAB — CBC
HCT: 42.3 % (ref 39.0–52.0)
Hemoglobin: 13.6 g/dL (ref 13.0–17.0)
MCH: 28.3 pg (ref 26.0–34.0)
MCHC: 32.2 g/dL (ref 30.0–36.0)
MCV: 87.9 fL (ref 80.0–100.0)
Platelets: ADEQUATE 10*3/uL (ref 150–400)
RBC: 4.81 MIL/uL (ref 4.22–5.81)
RDW: 12.4 % (ref 11.5–15.5)
WBC: 10.4 10*3/uL (ref 4.0–10.5)
nRBC: 0 % (ref 0.0–0.2)

## 2020-04-16 LAB — LIPASE, BLOOD: Lipase: 54 U/L — ABNORMAL HIGH (ref 11–51)

## 2020-04-16 LAB — MAGNESIUM: Magnesium: 1.9 mg/dL (ref 1.7–2.4)

## 2020-04-16 MED ORDER — SODIUM CHLORIDE 0.9% FLUSH
3.0000 mL | Freq: Once | INTRAVENOUS | Status: AC
  Administered 2020-04-16: 3 mL via INTRAVENOUS

## 2020-04-16 MED ORDER — SODIUM CHLORIDE 0.9 % IV BOLUS
1000.0000 mL | Freq: Once | INTRAVENOUS | Status: AC
  Administered 2020-04-16: 1000 mL via INTRAVENOUS

## 2020-04-16 NOTE — ED Triage Notes (Signed)
Pt presents for vomiting and dizziness, states this has been an ongoing issues for him, was recently hospitalized at [facility] and discharged for same.

## 2020-04-16 NOTE — ED Provider Notes (Signed)
[facility] EMERGENCY DEPARTMENT Provider Note   CSN: [id number] Arrival date & time: 04/16/20  1640     History Chief Complaint  Patient presents with  . Nausea  . Dizziness    Ronald Mcdowell is a 74 y.o. male.  HPI      Ronald Mcdowell is a 74 y.o. male with past medical history significant for atrial fibrillation (anticoagulated on Eliquis) anemia, hypertension, hyperthyroidism, ischemic cardiomyopathy, and reflux esophagitis who presents to the Emergency Department complaining of generalized weakness, fatigue, weight loss and vomiting and diarrhea.  Symptoms have been present for 2 months.  He reports an approximately 50 pound weight loss since March.  He has been evaluated here and at [facility] for same.  He was admitted at [facility] last week for dehydration.  He states that anytime he eats solid food or liquids that he throws up or has watery, green stool.  He was diagnosed with hyperthyroidism in April.  He is pending a nuclear medicine study of his thyroid that appears to be scheduled for early June.  He denies syncope, fever, chills, new medications, no dysuria, no rectal bleeding or melanotic stools..  No chest pain or shortness of breath.  He reports having an appointment with GI for Wednesday.  Last colonoscopy was 2017 and EGD in 2019.   Past Medical History:  Diagnosis Date  . Anemia   . Coronary artery disease    a. h/o MI in 2000 w/ prior LCX stenting;  b. 7.2014 Abnl Cardiolite, EF 41%, mod-large inferolat scar;  c. 07/2013 Cath/PCI: LM nl, LAD 10-20, D1 40-50p, LCX 95-99 @ distal stent margin (2.5x20 Promus Premier DES), RCA dom, 40p, 27m, 70d, EF 35-40%.  . Elevated cholesterol   . GERD (gastroesophageal reflux disease)   . Hemorrhoids   . Hypertension   . Ischemic cardiomyopathy    a. 07/2013 EF 35-40% by LV gram.  . Myocardial infarction ([place])   . Reflux esophagitis   . Spondylosis of cervical joint    C3-4, C6-7  . Tobacco user   . Vitamin D  deficiency disease 08/24/2019    Patient Active Problem List   Diagnosis Date Noted  . A-fib ([place]) 12/01/2019  . Hypokalemia 12/01/2019  . Arm numbness 12/01/2019  . Nicotine dependence 12/01/2019  . Vitamin D deficiency disease 08/24/2019  . Aortic atherosclerosis ([place]) 01/20/2018  . GERD (gastroesophageal reflux disease) 12/17/2017  . HLD (hyperlipidemia) 11/04/2017  . Liver cyst 11/30/2015  . TIA (transient ischemic attack) 04/10/2015  . HTN (hypertension) 08/24/2013  . Tobacco user   . Coronary artery disease   . Ischemic cardiomyopathy   . DYSPHAGIA ORAL PHASE 04/13/2010  . Old myocardial infarction 01/31/2010  . CORONARY ATHEROSCLEROSIS NATIVE CORONARY ARTERY 01/31/2010    Past Surgical History:  Procedure Laterality Date  . BIOPSY  01/09/2018   Procedure: BIOPSY;  Surgeon: Daneil Dolin, MD;  Location: [facility] ENDO SUITE;  Service: Endoscopy;;  esophageal biopsies  . CARDIAC CATHETERIZATION    . COLONOSCOPY    . COLONOSCOPY N/A 01/25/2016   RMR: normal ileocolonoscopy ( anal canal hemorrhoids)  . CORONARY ANGIOPLASTY WITH STENT PLACEMENT  08/23/2013   mid circumflex  DES    by Dr Martinique  . CORONARY STENT PLACEMENT  2000  . ESOPHAGOGASTRODUODENOSCOPY N/A 01/25/2016   RMR: Reflux esophagitits. Cervical web with passage of the scope. Status post esophageal biosy. Hiatal hernia.   [name] ESOPHAGOGASTRODUODENOSCOPY N/A 01/09/2018   Procedure: ESOPHAGOGASTRODUODENOSCOPY (EGD);  Surgeon: Daneil Dolin,  MD;  Location: [facility] ENDO SUITE;  Service: Endoscopy;  Laterality: N/A;  1:00pm  . LEFT HEART CATHETERIZATION WITH CORONARY ANGIOGRAM N/A 08/23/2013   Procedure: LEFT HEART CATHETERIZATION WITH CORONARY ANGIOGRAM;  Surgeon: Peter M Martinique, MD;  Location: [facility] CATH LAB;  Service: Cardiovascular;  Laterality: N/A;  . [name] DILATION N/A 01/09/2018   Procedure: [name] DILATION;  Surgeon: Daneil Dolin, MD;  Location: [facility] ENDO SUITE;  Service: Endoscopy;  Laterality: N/A;  . TRANSURETHRAL  RESECTION OF PROSTATE  01/09/2012   Procedure: TRANSURETHRAL RESECTION OF THE PROSTATE (TURP);  Surgeon: Marissa Nestle, MD;  Location: [facility] ORS;  Service: Urology;  Laterality: N/A;       Family History  Problem Relation Age of Onset  . Early death Mother        childbirth  . Heart attack Father   . Cancer Father   . Heart disease Brother        CABG  . Heart disease Brother        slight heart attack  . Anesthesia problems Neg Hx   . Hypotension Neg Hx   . Malignant hyperthermia Neg Hx   . Pseudochol deficiency Neg Hx   . Colon cancer Neg Hx   . Liver disease Neg Hx   . Gastric cancer Neg Hx   . Esophageal cancer Neg Hx     Social History   Tobacco Use  . Smoking status: Current Every Day Smoker    Packs/day: 1.00    Years: 53.00    Pack years: 53.00    Types: Cigarettes    Start date: 12/09/1965  . Smokeless tobacco: Never Used  . Tobacco comment: 3/4 pack of cigarettes daily; smoked since 74 years old  Substance Use Topics  . Alcohol use: No    Alcohol/week: 0.0 standard drinks  . Drug use: No    Home Medications Prior to Admission medications   Medication Sig Start Date End Date Taking? Authorizing Provider  amLODipine (NORVASC) 5 MG tablet TAKE 1 TABLET BY MOUTH  DAILY Patient taking differently: Take 5 mg by mouth daily.  02/15/20   Doree Albee, MD  apixaban (ELIQUIS) 5 MG TABS tablet TAKE 1 TABLET(5 MG) BY MOUTH TWICE DAILY Patient taking differently: Take 5 mg by mouth 2 (two) times daily. TAKE 1 TABLET(5 MG) BY MOUTH TWICE DAILY 12/15/19   Herminio Commons, MD  atorvastatin (LIPITOR) 40 MG tablet TAKE 1 TABLET BY MOUTH  DAILY 04/02/20 07/01/20  Hurshel Party C, MD  bisoprolol (ZEBETA) 5 MG tablet TAKE 1 TABLET BY MOUTH  DAILY Patient taking differently: Take 5 mg by mouth daily.  12/29/19   Ailene Ards, NP  Cholecalciferol (VITAMIN D-3) 125 MCG (5000 UT) TABS Take 1 tablet by mouth daily.    [provider]  lisinopril (ZESTRIL) 20 MG  tablet TAKE 1 TABLET BY MOUTH  DAILY Patient taking differently: Take 20 mg by mouth daily.  03/14/20   Doree Albee, MD  nitroGLYCERIN (NITROSTAT) 0.4 MG SL tablet Place 1 tablet (0.4 mg total) under the tongue every 5 (five) minutes as needed for chest pain. 12/01/19   Ailene Ards, NP  pantoprazole (PROTONIX) 40 MG tablet TAKE 1 TABLET BY MOUTH  DAILY Patient taking differently: Take 40 mg by mouth daily.  03/24/20   Doree Albee, MD  potassium chloride (KLOR-CON) 10 MEQ tablet Take 2 tablets (20 mEq total) by mouth daily. 02/07/20 05/07/20  Herminio Commons, MD  amLODipine ([place])  5 MG tablet Take 1 tablet (5 mg total) by mouth daily. 01/04/20   Herminio Commons, MD  atorvastatin (LIPITOR) 40 MG tablet Take 1 tablet (40 mg total) by mouth at bedtime. Reported on 11/30/2015 12/24/17   Raylene Everts, MD  atorvastatin (LIPITOR) 40 MG tablet TAKE 1 TABLET(40 MG) BY MOUTH DAILY Patient taking differently: Take 40 mg by mouth daily.  03/07/20   Ailene Ards, NP  bisoprolol (ZEBETA) 5 MG tablet TAKE 1 TABLET BY MOUTH DAILY 10/16/19   Hurshel Party C, MD  lisinopril (ZESTRIL) 20 MG tablet Take 20 mg by mouth daily.    [provider]    Allergies    Patient has no known allergies.  Review of Systems   Review of Systems  Constitutional: Positive for unexpected weight change. Negative for appetite change, chills and fever.  HENT: Negative for sore throat and trouble swallowing.   Respiratory: Negative for shortness of breath.   Cardiovascular: Negative for chest pain.  Gastrointestinal: Positive for diarrhea and vomiting. Negative for abdominal pain, blood in stool and nausea.  Genitourinary: Negative for decreased urine volume, difficulty urinating, dysuria and flank pain.  Musculoskeletal: Negative for arthralgias and back pain.  Skin: Negative for color change and rash.  Neurological: Positive for dizziness and weakness. Negative for syncope and numbness.    Hematological: Negative for adenopathy.    Physical Exam Updated Vital Signs BP 118/63   Pulse 92   Temp 98.3 F (36.8 C) (Oral)   Resp 16   Ht 5\' 5"  (1.651 m)   Wt 52.6 kg   SpO2 99%   BMI 19.30 kg/m   Physical Exam Vitals and nursing note reviewed.  Constitutional:      General: He is not in acute distress.    Appearance: Normal appearance.     Comments: Patient is elderly and frail-appearing  HENT:     Mouth/Throat:     Mouth: Mucous membranes are dry.  Cardiovascular:     Rate and Rhythm: Normal rate. Rhythm irregular.     Pulses: Normal pulses.  Pulmonary:     Effort: Pulmonary effort is normal.     Breath sounds: Normal breath sounds.  Chest:     Chest wall: No tenderness.  Abdominal:     General: There is no distension.     Palpations: Abdomen is soft.     Tenderness: There is no abdominal tenderness. There is no guarding.  Musculoskeletal:     Right lower leg: No edema.     Left lower leg: No edema.  Skin:    General: Skin is warm.     Capillary Refill: Capillary refill takes less than 2 seconds.     Findings: No erythema or rash.  Neurological:     General: No focal deficit present.     Mental Status: He is alert.     Sensory: No sensory deficit.     Motor: No weakness.     Comments: CN II-XII intact.  Speech clear.  No pronator drift.      ED Results / Procedures / Treatments   Labs (all labs ordered are listed, but only abnormal results are displayed) Labs Reviewed  COMPREHENSIVE METABOLIC PANEL - Abnormal; Notable for the following components:      Result Value   Chloride 115 (*)    CO2 15 (*)    BUN 52 (*)    Creatinine, Ser 1.46 (*)    AST 54 (*)    ALT  135 (*)    Total Bilirubin 1.3 (*)    GFR calc non Af Amer 47 (*)    GFR calc Af Amer 55 (*)    All other components within normal limits  URINALYSIS, ROUTINE W REFLEX MICROSCOPIC - Abnormal; Notable for the following components:   APPearance HAZY (*)    Hgb urine dipstick SMALL  (*)    Leukocytes,Ua MODERATE (*)    Bacteria, UA RARE (*)    All other components within normal limits  LIPASE, BLOOD - Abnormal; Notable for the following components:   Lipase 54 (*)    All other components within normal limits  BASIC METABOLIC PANEL - Abnormal; Notable for the following components:   Chloride 116 (*)    CO2 16 (*)    BUN 54 (*)    Creatinine, Ser 1.42 (*)    GFR calc non Af Amer 49 (*)    GFR calc Af Amer 57 (*)    All other components within normal limits  URINE CULTURE  CBC  MAGNESIUM  TSH    EKG None  Radiology No results found.  Procedures Procedures (including critical care time)  Medications Ordered in ED Medications  sodium chloride flush (NS) 0.9 % injection 3 mL (has no administration in time range)    ED Course  I have reviewed the triage vital signs and the nursing notes.  Pertinent labs & imaging results that were available during my care of the patient were reviewed by me and considered in my medical decision making (see chart for details).    MDM Rules/Calculators/A&P                       Patient here with generalized weakness, dizziness upon standing and vomiting diarrhea.  Symptoms have been present since March.  He was recently admitted at [facility] for dehydration and discharged earlier this week.  He reports a 50 pound weight loss in the last 2 months.  He was here on 03/27/2020 for abdominal pain and rectal bleeding.  He had a CT of the abdomen pelvis at that time without acute findings it was noted that he has cyst to the liver and kidneys, patient aware of kidney cysts.  He was recommended to follow-up with nephrology.   Today, labs show elevated BUN and creatinine that appear similar to baseline.  Urinalysis shows moderate leukocytes with rare bacteria, culture pending.  No dysuria symptoms.  No active diarrhea or vomiting during ER stay.  He has tolerated oral fluids.  Patient orthostatic, he is received 1 L of normal saline  IV, he is also tolerated oral fluid challenge without difficulty.  After IV fluids, repeat electrolytes show improvement of creatinine and bicarb.  Patient continues to remain orthostatic, but states that he is feeling better and no longer dizzy with standing he is requesting discharge home at this time.  He agrees to close follow-up with his gastroenterologist this week. Strict return precautions discussed.    Orthostatic VS for the past 24 hrs (Last 3 readings):  BP- Lying Pulse- Lying BP- Sitting Pulse- Sitting BP- Standing at 0 minutes Pulse- Standing at 0 minutes  04/17/20 0100 162/70 92 134/72 91 124/76 93  04/16/20 2216 124/76 83 107/68 89 106/71 101    Final Clinical Impression(s) / ED Diagnoses Final diagnoses:  Dehydration  Vomiting and diarrhea    Rx / DC Orders ED Discharge Orders    None       Allesandra Huebsch,  Clista Bernhardt 04/17/20 [id number]    Milton Ferguson, MD 04/17/20 [phone]

## 2020-04-17 ENCOUNTER — Ambulatory Visit (INDEPENDENT_AMBULATORY_CARE_PROVIDER_SITE_OTHER): Payer: Medicare Other | Admitting: Internal Medicine

## 2020-04-17 LAB — BASIC METABOLIC PANEL
Anion gap: 9 (ref 5–15)
BUN: 54 mg/dL — ABNORMAL HIGH (ref 8–23)
CO2: 16 mmol/L — ABNORMAL LOW (ref 22–32)
Calcium: 9.2 mg/dL (ref 8.9–10.3)
Chloride: 116 mmol/L — ABNORMAL HIGH (ref 98–111)
Creatinine, Ser: 1.42 mg/dL — ABNORMAL HIGH (ref 0.61–1.24)
GFR calc Af Amer: 57 mL/min — ABNORMAL LOW (ref >60)
GFR calc non Af Amer: 49 mL/min — ABNORMAL LOW (ref >60)
Glucose, Bld: 80 mg/dL (ref 70–99)
Potassium: 4.9 mmol/L (ref 3.5–5.1)
Sodium: 141 mmol/L (ref 135–145)

## 2020-04-17 NOTE — Discharge Instructions (Addendum)
It is important that you continue to drink plenty of water.  Small frequent meals.  Be sure to keep your appointment with Dr. Roseanne Kaufman office this week.  Return here if your symptoms worsen

## 2020-04-18 LAB — URINE CULTURE: Culture: NO GROWTH

## 2020-04-19 ENCOUNTER — Encounter: Payer: Self-pay | Admitting: Gastroenterology

## 2020-04-19 ENCOUNTER — Other Ambulatory Visit: Payer: Self-pay

## 2020-04-19 ENCOUNTER — Ambulatory Visit: Payer: Medicare Other | Admitting: "Endocrinology

## 2020-04-19 ENCOUNTER — Ambulatory Visit: Payer: Medicare Other | Admitting: Gastroenterology

## 2020-04-19 ENCOUNTER — Telehealth: Payer: Self-pay | Admitting: Internal Medicine

## 2020-04-19 VITALS — BP 113/64 | HR 68 | Temp 97.7°F | Ht 65.0 in | Wt 112.0 lb

## 2020-04-19 DIAGNOSIS — R634 Abnormal weight loss: Secondary | ICD-10-CM

## 2020-04-19 DIAGNOSIS — R1319 Other dysphagia: Secondary | ICD-10-CM

## 2020-04-19 DIAGNOSIS — K200 Eosinophilic esophagitis: Secondary | ICD-10-CM | POA: Diagnosis not present

## 2020-04-19 DIAGNOSIS — R7989 Other specified abnormal findings of blood chemistry: Secondary | ICD-10-CM

## 2020-04-19 DIAGNOSIS — R1310 Dysphagia, unspecified: Secondary | ICD-10-CM | POA: Diagnosis not present

## 2020-04-19 DIAGNOSIS — R197 Diarrhea, unspecified: Secondary | ICD-10-CM | POA: Diagnosis not present

## 2020-04-19 DIAGNOSIS — R945 Abnormal results of liver function studies: Secondary | ICD-10-CM

## 2020-04-19 DIAGNOSIS — K219 Gastro-esophageal reflux disease without esophagitis: Secondary | ICD-10-CM | POA: Diagnosis not present

## 2020-04-19 MED ORDER — SUCRALFATE 1 GM/10ML PO SUSP
1.0000 g | Freq: Three times a day (TID) | ORAL | 0 refills | Status: DC
Start: 2020-04-19 — End: 2020-05-31

## 2020-04-19 NOTE — Patient Instructions (Signed)
1. Increase pantoprazole to 40mg  twice a day before a meal.  2. Add Carafate before each meal and at bedtime for 10 days. RX sent to pharmacy. 3. Collect stool for testing to evaluate diarrhea.  4. We will call and schedule upper endoscopy in the near future to evaluate swallowing.
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
## 2020-04-19 NOTE — Telephone Encounter (Signed)
Ok to substitute capsules and instruct how to dissolve in water.

## 2020-04-19 NOTE — Telephone Encounter (Signed)
Spoke with pts pharmacy. The Carafate Capsules will be more cost effective for pt. Pill can be dissolved in water. Please advise.

## 2020-04-19 NOTE — Telephone Encounter (Signed)
PATIENT WIFE CALLED AND SAID PATIENT PRESCRIPTION WAS 90$   IS THERE ANYTHING CHEAPER THAT CAN BE SENT IN

## 2020-04-20 ENCOUNTER — Other Ambulatory Visit: Payer: Self-pay | Admitting: Gastroenterology

## 2020-04-20 NOTE — Telephone Encounter (Signed)
Noted. Spoke with the pharmacy and pts medication was changed. Pt is also aware that he can dissolve in water.

## 2020-04-25 ENCOUNTER — Encounter: Payer: Self-pay | Admitting: Gastroenterology

## 2020-04-25 DIAGNOSIS — E0500 Thyrotoxicosis with diffuse goiter without thyrotoxic crisis or storm: Secondary | ICD-10-CM

## 2020-04-25 HISTORY — DX: Thyrotoxicosis with diffuse goiter without thyrotoxic crisis or storm: E05.00

## 2020-04-25 NOTE — Assessment & Plan Note (Addendum)
Patient complains of solid food dysphagia/painful swallowing, postprandial vomiting, burning in his chest in the setting of known eosinophilic esophagitis, abnormal esophagus on recent CT chest, prior history of cervical/proximal esophageal web requiring dilation.  He is maintained on pantoprazole 40 mg daily.  At this point we will increase him to twice daily before meals, add Carafate for 10 days to try to get him some relief.  Plan to schedule EGD with possible esophageal dilation in the near future currently patient is undergoing work-up for hyperthyroidism, plans for thyroid studies next week with possible thyroid ablation for thyrotoxicosis.  We will need to await these findings prior to scheduling procedure.

## 2020-04-25 NOTE — Assessment & Plan Note (Signed)
Possibly related to hyperthyroidism.  Will obtain stool studies to rule out infectious etiology.

## 2020-04-25 NOTE — Assessment & Plan Note (Signed)
Previously had abnormal LFTs back in 2016 but they normalized.  Recent elevation as outlined under HPI.  Multiple hepatic cysts, dominant one measuring 7.2 x 6.4 cm on recent CT.  Gallbladder remains in situ.  Also had mildly elevated lipase in the setting of acute on chronic renal failure.  Query abnormal LFTs in the setting of thyrotoxicosis.  We will continue to follow, work-up if persisting.

## 2020-04-26 ENCOUNTER — Encounter (HOSPITAL_COMMUNITY)
Admission: RE | Admit: 2020-04-26 | Discharge: 2020-04-26 | Disposition: A | Discharge status: Home / Self Care | Payer: Medicare Other | Source: Ambulatory Visit | Attending: "Endocrinology | Admitting: "Endocrinology

## 2020-04-26 ENCOUNTER — Other Ambulatory Visit: Payer: Self-pay

## 2020-04-26 ENCOUNTER — Encounter (HOSPITAL_COMMUNITY): Payer: Self-pay

## 2020-04-26 DIAGNOSIS — E0590 Thyrotoxicosis, unspecified without thyrotoxic crisis or storm: Secondary | ICD-10-CM | POA: Insufficient documentation

## 2020-04-26 MED ORDER — SODIUM IODIDE I-123 7.4 MBQ CAPS
300.0000 | ORAL_CAPSULE | Freq: Once | ORAL | Status: AC
  Administered 2020-04-26: 301 via ORAL

## 2020-04-27 ENCOUNTER — Encounter (HOSPITAL_COMMUNITY)
Admission: RE | Admit: 2020-04-27 | Discharge: 2020-04-27 | Disposition: A | Discharge status: Home / Self Care | Payer: Medicare Other | Source: Ambulatory Visit | Attending: "Endocrinology | Admitting: "Endocrinology

## 2020-04-27 DIAGNOSIS — E0590 Thyrotoxicosis, unspecified without thyrotoxic crisis or storm: Secondary | ICD-10-CM | POA: Diagnosis not present

## 2020-05-01 ENCOUNTER — Telehealth: Payer: Self-pay | Admitting: Internal Medicine

## 2020-05-01 NOTE — Telephone Encounter (Signed)
Spoke with pts spouse. She is aware that a message was sent to [facility]. We will get back with pt and spouse.

## 2020-05-01 NOTE — Telephone Encounter (Signed)
Pt had OV with [place] on 5/26 and wanted to know if patient could go ahead and schedule his EGD since he followed up with his other doctor about his thyroid. Please advise. [phone]

## 2020-05-01 NOTE — Telephone Encounter (Signed)
Checked encounter form, procedure not listed to be scheduled.  Only thing listed on encounter form is "stools".  Routing to [facility] for advice.

## 2020-05-02 ENCOUNTER — Ambulatory Visit (INDEPENDENT_AMBULATORY_CARE_PROVIDER_SITE_OTHER): Payer: Medicare Other | Admitting: Nurse Practitioner

## 2020-05-02 ENCOUNTER — Other Ambulatory Visit: Payer: Self-pay

## 2020-05-02 ENCOUNTER — Encounter (INDEPENDENT_AMBULATORY_CARE_PROVIDER_SITE_OTHER): Payer: Self-pay | Admitting: Nurse Practitioner

## 2020-05-02 VITALS — BP 120/60 | HR 76 | Temp 97.2°F | Ht 65.0 in | Wt 108.2 lb

## 2020-05-02 DIAGNOSIS — I10 Essential (primary) hypertension: Secondary | ICD-10-CM | POA: Diagnosis not present

## 2020-05-02 DIAGNOSIS — E785 Hyperlipidemia, unspecified: Secondary | ICD-10-CM | POA: Diagnosis not present

## 2020-05-02 DIAGNOSIS — R42 Dizziness and giddiness: Secondary | ICD-10-CM | POA: Diagnosis not present

## 2020-05-02 DIAGNOSIS — I951 Orthostatic hypotension: Secondary | ICD-10-CM | POA: Diagnosis not present

## 2020-05-02 NOTE — Progress Notes (Addendum)
Subjective:  Patient ID: Ronald Mcdowell, male    DOB: 06/01/1946  Age: 74 y.o. MRN: [id number]  CC:  Chief Complaint  Patient presents with  . Dizziness    x's 2 wks  . Hyperthyroidism  . Weight Loss      HPI  This patient comes in today for the above.  His main complaint today is that he has dizziness.  He tells me has been going on for about 3 weeks.  He tells me it happens every day and seems to occur most of the day.  He also feels like he is having some ear fullness, right worse than the left.  He has not noticed any significant triggers, but does not feel the dizziness as much when he lies down.  He describes it as a feeling of off balance.  He does have significant hyperthyroidism and is undergoing evaluation for thyrotoxicosis with endocrinology.  He denies having any recent falls, feels like he was close to falling a couple times.  He tells me that around the same time of the dizziness occurred his other symptoms related to his hyperthyroidism also started happening such as weight loss.  He tells me he follows up with endocrinology tomorrow and has undergone a nuclear medicine study of his thyroid earlier this week to determine etiology of his hyperthyroidism.  He tells me he is also been evaluated for this in the emergency department, last EKG was collected approximately 2 weeks ago during an ER visit.  That time his EKG showed sinus rhythm.  Hypertension: He does have a history of hypertension and coronary artery disease.  He is on a beta-blocker, lisinopril, and amlodipine.  He continues to take his medications as prescribed.   Past Medical History:  Diagnosis Date  . Anemia   . Coronary artery disease    a. h/o MI in 2000 w/ prior LCX stenting;  b. 7.2014 Abnl Cardiolite, EF 41%, mod-large inferolat scar;  c. 07/2013 Cath/PCI: LM nl, LAD 10-20, D1 40-50p, LCX 95-99 @ distal stent margin (2.5x20 Promus Premier DES), RCA dom, 40p, 69m, 70d, EF 35-40%.  . Elevated  cholesterol   . GERD (gastroesophageal reflux disease)   . Hemorrhoids   . Hypertension   . Ischemic cardiomyopathy    a. 07/2013 EF 35-40% by LV gram.  . Myocardial infarction ([place])   . Reflux esophagitis   . Spondylosis of cervical joint    C3-4, C6-7  . Tobacco user   . Vitamin D deficiency disease 08/24/2019      Family History  Problem Relation Age of Onset  . Early death Mother        childbirth  . Heart attack Father   . Cancer Father   . Heart disease Brother        CABG  . Heart disease Brother        slight heart attack  . Anesthesia problems Neg Hx   . Hypotension Neg Hx   . Malignant hyperthermia Neg Hx   . Pseudochol deficiency Neg Hx   . Colon cancer Neg Hx   . Liver disease Neg Hx   . Gastric cancer Neg Hx   . 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   Social History   Tobacco Use  . Smoking status: Current Every Day Smoker    Packs/day: 1.00  Years: 53.00    Pack years: 53.00    Types: Cigarettes    Start date: 12/09/1965  . Smokeless tobacco: Never Used  . Tobacco comment: 3/4 pack of cigarettes daily; smoked since 74 years old  Substance Use Topics  . Alcohol use: No    Alcohol/week: 0.0 standard drinks    Comment: No bowel since year 2000.  Some previous heavy use.     Current Meds  Medication Sig  . amLODipine (NORVASC) 5 MG tablet TAKE 1 TABLET BY MOUTH  DAILY (Patient taking differently: Take 5 mg by mouth daily. )  . apixaban (ELIQUIS) 5 MG TABS tablet TAKE 1 TABLET(5 MG) BY MOUTH TWICE DAILY (Patient taking differently: Take 5 mg by mouth 2 (two) times daily. TAKE 1 TABLET(5 MG) BY MOUTH TWICE DAILY)  . atorvastatin (LIPITOR) 40 MG tablet TAKE 1 TABLET BY MOUTH  DAILY  . bisoprolol (ZEBETA) 5 MG tablet TAKE 1 TABLET BY MOUTH  DAILY (Patient taking differently: Take 5 mg by mouth daily. )  .  lisinopril (ZESTRIL) 20 MG tablet TAKE 1 TABLET BY MOUTH  DAILY (Patient taking differently: Take 20 mg by mouth daily. )  . nitroGLYCERIN (NITROSTAT) 0.4 MG SL tablet Place 1 tablet (0.4 mg total) under the tongue every 5 (five) minutes as needed for chest pain.  . pantoprazole (PROTONIX) 40 MG tablet TAKE 1 TABLET BY MOUTH  DAILY (Patient taking differently: Take 40 mg by mouth daily. )  . potassium chloride (KLOR-CON) 10 MEQ tablet Take 2 tablets (20 mEq total) by mouth daily.  . sucralfate (CARAFATE) 1 GM/10ML suspension Take 10 mLs (1 g total) by mouth 4 (four) times daily -  with meals and at bedtime.    ROS:  Review of Systems  Constitutional: Positive for weight loss. Negative for chills, fever and malaise/fatigue.  HENT: Positive for hearing loss and tinnitus. Negative for ear pain.   Eyes: Positive for blurred vision.  Cardiovascular: Negative for chest pain and palpitations.  Neurological: Positive for dizziness.     Objective:   Today's Vitals: BP 120/60 (BP Location: Left Arm, Patient Position: Sitting, Cuff Size: Normal) Comment: sitting  Pulse 76   Temp (!) 97.2 F (36.2 C) (Temporal)   Ht 5\' 5"  (1.651 m)   Wt 108 lb 3.2 oz (49.1 kg)   SpO2 97%   BMI 18.01 kg/m  Vitals with BMI 05/02/2020 04/19/2020 04/17/2020  Height 5\' 5"  5\' 5"  -  Weight 108 lbs 3 oz 112 lbs -  BMI 60.63 01.60 -  Systolic 109 323 557  Diastolic 60 64 70  Pulse 76 68 70     Physical Exam Vitals reviewed.  Constitutional:      Appearance: Normal appearance.  HENT:     Head: Normocephalic and atraumatic.     Right Ear: Hearing, tympanic membrane and ear canal normal. No swelling or tenderness. No middle ear effusion. There is no impacted cerumen. No foreign body. Tympanic membrane is not erythematous.     Left Ear: Hearing, tympanic membrane and ear canal normal. No swelling or tenderness.  No middle ear effusion. There is no impacted cerumen. No foreign body. Tympanic membrane is not  erythematous.  Cardiovascular:     Rate and Rhythm: Normal rate and regular rhythm.  Pulmonary:     Effort: Pulmonary effort is normal.     Breath sounds: Normal breath sounds.  Musculoskeletal:     Cervical back: Neck supple.  Skin:    General: Skin is warm  and dry.  Neurological:     Mental Status: He is alert and oriented to person, place, and time.  Psychiatric:        Mood and Affect: Mood normal.        Behavior: Behavior normal.        Thought Content: Thought content normal.        Judgment: Judgment normal.          Assessment and Plan   1. Orthostatic hypotension   2. Dizziness   3. Essential hypertension   4. Hyperlipidemia, unspecified hyperlipidemia type      Plan: 1.,  2.  I believe that his dizziness is probably related to his orthostatic hypotension as his vital signs did show a drop in blood pressure upon standing.  I also think that his hyperthyroidism can be contributing to his dizziness as well.  I did discuss possibly trying meclizine as needed, however I do not know how helpful this medication will be because I do not believe that he is having vertigo, and it does carry some potential side effects especially for an individual greater than 23 years of age.  In addition it is supposed to be used with caution and hyperthyroidism.  At this time I recommended that he focus on making position changes quite slowly and he follow-up with his endocrinologist as scheduled for further evaluation and management of his hyperthyroidism.  I did encourage him to discuss possibly using meclizine as needed with his endocrinologist for his dizziness.  He will follow-up in approximately 4 to 6 weeks to see how his symptoms are going.  3.  I will keep him on his current medication regimen as prescribed right now.  4.  I do note that he has not had a lipid panel collected in some time, and he is on atorvastatin.  Once hyper thyroidism treatment has been initiated will need to  consider checking blood work including lipid panel.   Tests ordered No orders of the defined types were placed in this encounter.     No orders of the defined types were placed in this encounter.   Patient to follow-up in 4 to 6 weeks or sooner as needed.  Ailene Ards, NP

## 2020-05-02 NOTE — Patient Instructions (Addendum)
ASK DR. NIDA ABOUT TAKING MECLIZINE FOR DIZZINESS.   Orthostatic Hypotension Blood pressure is a measurement of how strongly, or weakly, your blood is pressing against the walls of your arteries. Orthostatic hypotension is a sudden drop in blood pressure that happens when you quickly change positions, such as when you get up from sitting or lying down. Arteries are blood vessels that carry blood from your heart throughout your body. When blood pressure is too low, you may not get enough blood to your brain or to the rest of your organs. This can cause weakness, light-headedness, rapid heartbeat, and fainting. This can last for just a few seconds or for up to a few minutes. Orthostatic hypotension is usually not a serious problem. However, if it happens frequently or gets worse, it may be a sign of something more serious. What are the causes? This condition may be caused by:  Sudden changes in posture, such as standing up quickly after you have been sitting or lying down.  Blood loss.  Loss of body fluids (dehydration).  Heart problems.  Hormone (endocrine) problems.  Pregnancy.  Severe infection.  Lack of certain nutrients.  Severe allergic reactions (anaphylaxis).  Certain medicines, such as blood pressure medicine or medicines that make the body lose excess fluids (diuretics). Sometimes, this condition can be caused by not taking medicine as directed, such as taking too much of a certain medicine. What increases the risk? The following factors may make you more likely to develop this condition:  Age. Risk increases as you get older.  Conditions that affect the heart or the central nervous system.  Taking certain medicines, such as blood pressure medicine or diuretics.  Being pregnant. What are the signs or symptoms? Symptoms of this condition may include:  Weakness.  Light-headedness.  Dizziness.  Blurred vision.  Fatigue.  Rapid heartbeat.  Fainting, in severe  cases. How is this diagnosed? This condition is diagnosed based on:  Your medical history.  Your symptoms.  Your blood pressure measurement. Your health care provider will check your blood pressure when you are: ? Lying down. ? Sitting. ? Standing. A blood pressure reading is recorded as two numbers, such as "120 over 80" (or 120/80). The first ("top") number is called the systolic pressure. It is a measure of the pressure in your arteries as your heart beats. The second ("bottom") number is called the diastolic pressure. It is a measure of the pressure in your arteries when your heart relaxes between beats. Blood pressure is measured in a unit called mm Hg. Healthy blood pressure for most adults is 120/80. If your blood pressure is below 90/60, you may be diagnosed with hypotension. Other information or tests that may be used to diagnose orthostatic hypotension include:  Your other vital signs, such as your heart rate and temperature.  Blood tests.  Tilt table test. For this test, you will be safely secured to a table that moves you from a lying position to an upright position. Your heart rhythm and blood pressure will be monitored during the test. How is this treated? This condition may be treated by:  Changing your diet. This may involve eating more salt (sodium) or drinking more water.  Taking medicines to raise your blood pressure.  Changing the dosage of certain medicines you are taking that might be lowering your blood pressure.  Wearing compression stockings. These stockings help to prevent blood clots and reduce swelling in your legs. In some cases, you may need to go to  the hospital for:  Fluid replacement. This means you will receive fluids through an IV.  Blood replacement. This means you will receive donated blood through an IV (transfusion).  Treating an infection or heart problems, if this applies.  Monitoring. You may need to be monitored while medicines that you  are taking wear off. Follow these instructions at home: Eating and drinking   Drink enough fluid to keep your urine pale yellow.  Eat a healthy diet, and follow instructions from your health care provider about eating or drinking restrictions. A healthy diet includes: ? Fresh fruits and vegetables. ? Whole grains. ? Lean meats. ? Low-fat dairy products.  Eat extra salt only as directed. Do not add extra salt to your diet unless your health care provider told you to do that.  Eat frequent, small meals.  Avoid standing up suddenly after eating. Medicines  Take over-the-counter and prescription medicines only as told by your health care provider. ? Follow instructions from your health care provider about changing the dosage of your current medicines, if this applies. ? Do not stop or adjust any of your medicines on your own. General instructions   Wear compression stockings as told by your health care provider.  Get up slowly from lying down or sitting positions. This gives your blood pressure a chance to adjust.  Avoid hot showers and excessive heat as directed by your health care provider.  Return to your normal activities as told by your health care provider. Ask your health care provider what activities are safe for you.  Do not use any products that contain nicotine or tobacco, such as cigarettes, e-cigarettes, and chewing tobacco. If you need help quitting, ask your health care provider.  Keep all follow-up visits as told by your health care provider. This is important. Contact a health care provider if you:  Vomit.  Have diarrhea.  Have a fever for more than 2-3 days.  Feel more thirsty than usual.  Feel weak and tired. Get help right away if you:  Have chest pain.  Have a fast or irregular heartbeat.  Develop numbness in any part of your body.  Cannot move your arms or your legs.  Have trouble speaking.  Become sweaty or feel  light-headed.  Faint.  Feel short of breath.  Have trouble staying awake.  Feel confused. Summary  Orthostatic hypotension is a sudden drop in blood pressure that happens when you quickly change positions.  Orthostatic hypotension is usually not a serious problem.  It is diagnosed by having your blood pressure taken lying down, sitting, and then standing.  It may be treated by changing your diet or adjusting your medicines. This information is not intended to replace advice given to you by your health care provider. Make sure you discuss any questions you have with your health care provider. Document Revised: 05/07/2018 Document Reviewed: 05/07/2018 Elsevier Patient Education  [place].

## 2020-05-03 ENCOUNTER — Ambulatory Visit (INDEPENDENT_AMBULATORY_CARE_PROVIDER_SITE_OTHER): Payer: Medicare Other | Admitting: "Endocrinology

## 2020-05-03 ENCOUNTER — Encounter: Payer: Self-pay | Admitting: "Endocrinology

## 2020-05-03 VITALS — BP 118/64 | HR 65 | Ht 65.0 in | Wt 107.4 lb

## 2020-05-03 DIAGNOSIS — E0590 Thyrotoxicosis, unspecified without thyrotoxic crisis or storm: Secondary | ICD-10-CM

## 2020-05-03 NOTE — Progress Notes (Signed)
05/03/2020      Endocrinology follow-up note    Subjective:    Patient ID: Ronald Mcdowell, male    DOB: Jan 27, 1946, PCP Doree Albee, MD.   Past Medical History:  Diagnosis Date  . Anemia   . Coronary artery disease    a. h/o MI in 2000 w/ prior LCX stenting;  b. 7.2014 Abnl Cardiolite, EF 41%, mod-large inferolat scar;  c. 07/2013 Cath/PCI: LM nl, LAD 10-20, D1 40-50p, LCX 95-99 @ distal stent margin (2.5x20 Promus Premier DES), RCA dom, 40p, 40m, 70d, EF 35-40%.  . Elevated cholesterol   . GERD (gastroesophageal reflux disease)   . Hemorrhoids   . Hypertension   . Ischemic cardiomyopathy    a. 07/2013 EF 35-40% by LV gram.  . Myocardial infarction ([place])   . Reflux esophagitis   . Spondylosis of cervical joint    C3-4, C6-7  . Tobacco user   . Vitamin D deficiency disease 08/24/2019    Past Surgical History:  Procedure Laterality Date  . BIOPSY  01/09/2018   Procedure: BIOPSY;  Surgeon: Daneil Dolin, MD;  Location: [facility];  Service: Endoscopy;;  esophageal biopsies  . CARDIAC CATHETERIZATION    . COLONOSCOPY    . COLONOSCOPY N/A 01/25/2016   RMR: normal ileocolonoscopy ( anal canal hemorrhoids)  . CORONARY ANGIOPLASTY WITH STENT PLACEMENT  08/23/2013   mid circumflex  DES    by Dr Martinique  . CORONARY STENT PLACEMENT  2000  . ESOPHAGOGASTRODUODENOSCOPY N/A 01/25/2016   RMR: Reflux esophagitits. Cervical web with passage of the scope. Status post esophageal biosy. Hiatal hernia.   [name] ESOPHAGOGASTRODUODENOSCOPY N/A 01/09/2018   Rourk: cervical/proximal esophageal web s/p dilation, markedly abnormal esophagus c/w biopsy proven eosinophilic esophagitis. small hiatal hernia.  [name] LEFT HEART CATHETERIZATION WITH CORONARY ANGIOGRAM N/A 08/23/2013   Procedure: LEFT HEART CATHETERIZATION WITH CORONARY ANGIOGRAM;  Surgeon: Peter M Martinique, MD;  Location: [facility] CATH LAB;  Service: Cardiovascular;  Laterality: N/A;  . [name] DILATION N/A 01/09/2018   Procedure: [name]  DILATION;  Surgeon: Daneil Dolin, MD;  Location: [facility];  Service: Endoscopy;  Laterality: N/A;  . TRANSURETHRAL RESECTION OF PROSTATE  01/09/2012   Procedure: TRANSURETHRAL RESECTION OF THE PROSTATE (TURP);  Surgeon: Marissa Nestle, MD;  Location: [facility] ORS;  Service: Urology;  Laterality: N/A;    Social History   Socioeconomic History  . Marital status: Married    Spouse name: [name]  . Number of children: Not on file  . Years of education: 11th grade  . Highest education level: 11th grade  Occupational History  . Occupation: Librarian, academic  Tobacco Use  . Smoking status: Current Every Day Smoker    Packs/day: 1.00    Years: 53.00    Pack years: 53.00    Types: Cigarettes    Start date: 12/09/1965  . Smokeless tobacco: Never Used  . Tobacco comment: 3/4 pack of cigarettes daily; smoked since 74 years old  Substance and Sexual Activity  . Alcohol use: No    Alcohol/week: 0.0 standard drinks    Comment: No bowel since year 2000.  Some previous heavy use.  . Drug use: No  . Sexual activity: Yes    Birth control/protection: None  Other Topics Concern  . 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   Social Determinants of Health   Financial Resource Strain:   . Difficulty  of Paying Living Expenses:   Food Insecurity:   . Worried About Charity fundraiser in the Last Year:   . Arboriculturist in the Last Year:   Transportation Needs:   . Film/video editor (Medical):   [name] Lack of Transportation (Non-Medical):   Physical Activity:   . Days of Exercise per Week:   . Minutes of Exercise per Session:   Stress:   . Feeling of Stress :   Social Connections:   . Frequency of Communication with Friends and Family:   . Frequency of Social Gatherings with Friends and Family:   . Attends Religious Services:   . Active Member of Clubs or Organizations:   . Attends Theatre manager Meetings:   [name] Marital Status:     Family History  Problem Relation Age of Onset  . Early death Mother        childbirth  . Heart attack Father   . Cancer Father   . Heart disease Brother        CABG  . Heart disease Brother        slight heart attack  . Anesthesia problems Neg Hx   . Hypotension Neg Hx   . Malignant hyperthermia Neg Hx   . Pseudochol deficiency Neg Hx   . Colon cancer Neg Hx   . Liver disease Neg Hx   . Gastric cancer Neg Hx   . Esophageal cancer Neg Hx     Outpatient Encounter Medications as of 05/03/2020  Medication Sig  . amLODipine (NORVASC) 5 MG tablet TAKE 1 TABLET BY MOUTH  DAILY (Patient taking differently: Take 5 mg by mouth daily. )  . apixaban (ELIQUIS) 5 MG TABS tablet TAKE 1 TABLET(5 MG) BY MOUTH TWICE DAILY (Patient taking differently: Take 5 mg by mouth 2 (two) times daily. TAKE 1 TABLET(5 MG) BY MOUTH TWICE DAILY)  . atorvastatin (LIPITOR) 40 MG tablet TAKE 1 TABLET BY MOUTH  DAILY  . bisoprolol (ZEBETA) 5 MG tablet TAKE 1 TABLET BY MOUTH  DAILY (Patient taking differently: Take 5 mg by mouth daily. )  . lisinopril (ZESTRIL) 20 MG tablet TAKE 1 TABLET BY MOUTH  DAILY (Patient taking differently: Take 20 mg by mouth daily. )  . nitroGLYCERIN (NITROSTAT) 0.4 MG SL tablet Place 1 tablet (0.4 mg total) under the tongue every 5 (five) minutes as needed for chest pain.  . pantoprazole (PROTONIX) 40 MG tablet TAKE 1 TABLET BY MOUTH  DAILY (Patient taking differently: Take 40 mg by mouth daily. )  . potassium chloride (KLOR-CON) 10 MEQ tablet Take 2 tablets (20 mEq total) by mouth daily.  . sucralfate (CARAFATE) 1 GM/10ML suspension Take 10 mLs (1 g total) by mouth 4 (four) times daily -  with meals and at bedtime.  . [DISCONTINUED] amLODipine (NORVASC) 5 MG tablet Take 1 tablet (5 mg total) by mouth daily.  . [DISCONTINUED] atorvastatin (LIPITOR) 40 MG tablet Take 1 tablet (40 mg total) by mouth at bedtime. Reported on 11/30/2015  .  [DISCONTINUED] atorvastatin (LIPITOR) 40 MG tablet TAKE 1 TABLET(40 MG) BY MOUTH DAILY (Patient taking differently: Take 40 mg by mouth daily. )  . [DISCONTINUED] bisoprolol (ZEBETA) 5 MG tablet TAKE 1 TABLET BY MOUTH DAILY  . [DISCONTINUED] lisinopril (ZESTRIL) 20 MG tablet Take 20 mg by mouth daily.   No facility-administered encounter medications on file as of 05/03/2020.    ALLERGIES: No Known Allergies  VACCINATION STATUS: Immunization History  Administered Date(s) Administered  .  Influenza,inj,Quad PF,6+ Mos 08/24/2013, 11/04/2017  . Influenza-Unspecified 09/26/2019  . Moderna SARS-COVID-2 Vaccination 02/15/2020  . Pneumococcal Conjugate-13 04/22/2019  . Td 02/23/2017     HPI  AIDON KLEMENS is 74 y.o. male who presents today with a medical history as above. he is being seen in follow-up after he was seen in consultation for hyperthyroidism requested by Doree Albee, MD.  he has been dealing with symptoms of weight loss of approximately 50 pounds, anxiety, frequent bowel movements, and heat intolerance for 3 to 6 months. These symptoms are progressively worsening and troubling to him. his most recent thyroid labs revealed significant elevation of thyroid hormone and suppressed TSH. -His subsequent thyroid uptake and scan confirms primary hyperthyroidism with 72% uptake.  he denies dysphagia, choking, shortness of breath, no recent voice change.    he denies family history of thyroid dysfunction, denies family hx of thyroid cancer.  He denies any exposure to amiodarone. he denies personal history of goiter.                           Review of systems  Constitutional: + weight loss, + fatigue, + subjective hyperthermia Eyes: no blurry vision, - xerophthalmia ENT: no sore throat, no nodules palpated in throat, no dysphagia/odynophagia, nor hoarseness Cardiovascular: no Chest Pain, no Shortness of Breath, +  palpitations, no leg swelling Respiratory: no cough, no  SOB Gastrointestinal: no Nausea, no Vomiting, no Diarhhea Musculoskeletal: no muscle/joint aches Skin: no rashes Neurological: +  tremors, no numbness, no tingling, no dizziness Psychiatric: no depression, +  anxiety   Objective:    BP 118/64   Pulse 65   Ht 5\' 5"  (1.651 m)   Wt 107 lb 6.4 oz (48.7 kg)   BMI 17.87 kg/m   Wt Readings from Last 3 Encounters:  05/03/20 107 lb 6.4 oz (48.7 kg)  05/02/20 108 lb 3.2 oz (49.1 kg)  04/19/20 112 lb (50.8 kg)                                                Physical exam  Constitutional: Body mass index is 17.87 kg/m., not in acute distress, + anxious state of mind Eyes: PERRLA, EOMI, - exophthalmos ENT: moist mucous membranes, -  thyromegaly, no cervical lymphadenopathy Cardiovascular: + active precordial activity, -tachycardic (patient is on a beta-blocker),  no Murmur/Rubs/Gallops Respiratory:  adequate breathing efforts, no gross chest deformity, Clear to auscultation bilaterally Gastrointestinal: abdomen soft, Non -tender, No distension, Bowel Sounds present Musculoskeletal: no gross deformities, strength intact in all four extremities Skin: moist, warm, no rashes Neurological:  +  tremor with outstretched hands,  + Deep Tendon Reflexes  on both lower extremities.   CMP     Component Value Date/Time   NA 141 04/17/2020 0031   K 4.9 04/17/2020 0031   CL 116 (H) 04/17/2020 0031   CO2 16 (L) 04/17/2020 0031   GLUCOSE 80 04/17/2020 0031   BUN 54 (H) 04/17/2020 0031   CREATININE 1.42 (H) 04/17/2020 0031   CREATININE 0.93 03/14/2020 1341   CALCIUM 9.2 04/17/2020 0031   PROT 7.3 04/16/2020 2208   ALBUMIN 3.5 04/16/2020 2208   AST 54 (H) 04/16/2020 2208   ALT 135 (H) 04/16/2020 2208   ALKPHOS 76 04/16/2020 2208   BILITOT 1.3 (H) 04/16/2020 2208   GFRNONAA  49 (L) 04/17/2020 0031   GFRNONAA 82 03/14/2020 1341   GFRAA 57 (L) 04/17/2020 0031   GFRAA 95 03/14/2020 1341     CBC    Component Value Date/Time   WBC 10.4  04/16/2020 2208   RBC 4.81 04/16/2020 2208   HGB 13.6 04/16/2020 2208   HCT 42.3 04/16/2020 2208   PLT  04/16/2020 2208    PLATELET CLUMPS NOTED ON SMEAR, COUNT APPEARS ADEQUATE   MCV 87.9 04/16/2020 2208   MCH 28.3 04/16/2020 2208   MCHC 32.2 04/16/2020 2208   RDW 12.4 04/16/2020 2208   LYMPHSABS 3,434 03/12/2016 0707   MONOABS 808 03/12/2016 0707   EOSABS 303 03/12/2016 0707   BASOSABS 0 03/12/2016 0707     Diabetic Labs (most recent): Lab Results  Component Value Date   HGBA1C 5.9 (H) 04/10/2015   HGBA1C 5.5 06/06/2013    Lipid Panel     Component Value Date/Time   CHOL 131 11/04/2017 1417   TRIG 80 11/04/2017 1417   HDL 36 (L) 11/04/2017 1417   CHOLHDL 3.6 11/04/2017 1417   VLDL 15 03/12/2016 0704   LDLCALC 79 11/04/2017 1417     Lab Results  Component Value Date   TSH 0.01 (L) 03/14/2020   TSH 0.674 04/10/2015   TSH 0.455 06/06/2013    Thyroid uptake and scan on April 27, 2020 FINDINGS: Homogeneous tracer distribution in both thyroid lobes.  No focal areas of increased or decreased tracer localization.  4 hour I-123 uptake = 41% (normal 5-20%)  ,  24 hour I-123 uptake = 72% (normal 10-30%)  IMPRESSION: Normal thyroid scan.  Elevated 4 hour and 24 hour radio iodine uptakes consistent with hyperthyroidism.  Overall findings consistent with Graves disease.   Assessment & Plan:   1. Hyperthyroidism  2.  Graves' disease  -His work-up so far confirms primary hyperthyroidism from Graves' disease. I had a long discussion with him and his wife about complications of untreated primary hypothyroidism.  - Options of therapy are discussed with him.  He agrees with my suggestion of the option of treating it with  definitive therapy with RAI ablation of the thyroid.    -Patient is made aware of the high likelihood of post ablative hypothyroidism with subsequent need for lifelong thyroid hormone replacement. heunderstands this outcome  and he is  willing to  proceed.  Although surgery is one other choice of treatment in some cases, in his case surgery is not a good fit for presentation with only mild goiter.    His pulse rate is controlled at 65- 69, on beta-blocker. -I did not initiate any prescription for him today.  -Patient is advised to maintain close follow up with Doree Albee, MD for primary care needs.     - Time spent on this patient care encounter:  20 minutes of which 50% was spent in  counseling and the rest reviewing  his current and  previous labs / studies and medications  doses and developing a plan for long term care. Ronald Mcdowell  participated in the discussions, expressed understanding, and voiced agreement with the above plans.  All questions were answered to his satisfaction. he is encouraged to contact clinic should he have any questions or concerns prior to his return visit.  Follow up plan: Return in about 9 weeks (around 07/05/2020) for F/U with Labs after I131 Therapy.   Thank you for involving me in the care of this pleasant patient, and I will continue to  update you with his progress.  Glade Lloyd, MD [facility] Phone: [phone]  Fax: [phone]   05/03/2020, 2:05 PM  This note was partially dictated with voice recognition software. Similar sounding words can be transcribed inadequately or may not  be corrected upon review.

## 2020-05-07 NOTE — Telephone Encounter (Signed)
Patient is scheduled for radioactive iodine treatment for hyperthyroidism on 05/09/20. He will have a period of isolation time, at least several days.   OK to schedule EGD/ED with RMR for odynophagia, dysphagia, n/v, abnormal esophagus on CT, h/o eosinophilic esophagitis.  -would probably give at least 3 weeks between radioactive iodine tx and EGD -he will need to hold Eliquis 48 hours before procedure.

## 2020-05-08 NOTE — Telephone Encounter (Signed)
No available openings at this time for EGD/DIL. Will call pt to schedule if someone cancels.

## 2020-05-09 ENCOUNTER — Other Ambulatory Visit: Payer: Self-pay

## 2020-05-09 ENCOUNTER — Encounter (HOSPITAL_COMMUNITY)
Admission: RE | Admit: 2020-05-09 | Discharge: 2020-05-09 | Disposition: A | Discharge status: Home / Self Care | Payer: Medicare Other | Source: Ambulatory Visit | Attending: "Endocrinology | Admitting: "Endocrinology

## 2020-05-09 DIAGNOSIS — E0590 Thyrotoxicosis, unspecified without thyrotoxic crisis or storm: Secondary | ICD-10-CM | POA: Insufficient documentation

## 2020-05-09 MED ORDER — SODIUM IODIDE I 131 CAPSULE
12.0000 | Freq: Once | INTRAVENOUS | Status: AC | PRN
  Administered 2020-05-09: 12.44 via ORAL

## 2020-05-10 NOTE — Telephone Encounter (Signed)
Left a detailed message for pt. Pt is aware that our office will contact him when an apt is available for an EGD.

## 2020-05-12 ENCOUNTER — Ambulatory Visit (HOSPITAL_COMMUNITY): Payer: Medicare Other

## 2020-05-12 ENCOUNTER — Ambulatory Visit: Payer: Self-pay | Admitting: "Endocrinology

## 2020-05-17 NOTE — Telephone Encounter (Signed)
Spoke to pt's wife, EGD/DIL w/RMR scheduled for 05/31/20 at 8:30am. COVID test 05/30/20 at 10:20am. Wife aware he needs to hold Eliquis for 48 hours prior to procedure. Orders entered.  PA for EGD/DIL submitted via [facility] website. Case approved. PA# [id number], valid 05/31/20-08/29/20.

## 2020-05-26 ENCOUNTER — Other Ambulatory Visit: Payer: Self-pay | Admitting: Gastroenterology

## 2020-05-30 ENCOUNTER — Other Ambulatory Visit (HOSPITAL_COMMUNITY)
Admission: RE | Admit: 2020-05-30 | Discharge: 2020-05-30 | Disposition: A | Discharge status: Home / Self Care | Payer: Medicare Other | Source: Ambulatory Visit | Attending: Internal Medicine | Admitting: Internal Medicine

## 2020-05-30 ENCOUNTER — Other Ambulatory Visit: Payer: Self-pay

## 2020-05-30 DIAGNOSIS — Z01812 Encounter for preprocedural laboratory examination: Secondary | ICD-10-CM | POA: Diagnosis not present

## 2020-05-30 DIAGNOSIS — Z20822 Contact with and (suspected) exposure to covid-19: Secondary | ICD-10-CM | POA: Diagnosis not present

## 2020-05-30 LAB — SARS CORONAVIRUS 2 (TAT 6-24 HRS): SARS Coronavirus 2: NEGATIVE

## 2020-05-30 NOTE — Progress Notes (Signed)
Kim from [facility] called and said there was not enough liquid in the tube to process patient's COVID test. The lid was on tight and the tube was dry on the outside. (No liquid in bag) Called patient and explained what happened. Patient understood. Patient is going to come back by before 4:30 to be retested. Nothing further needed.

## 2020-05-31 ENCOUNTER — Ambulatory Visit (HOSPITAL_COMMUNITY)
Admission: RE | Admit: 2020-05-31 | Discharge: 2020-05-31 | Disposition: A | Discharge status: Home / Self Care | Payer: Medicare Other | Attending: Internal Medicine | Admitting: Internal Medicine

## 2020-05-31 ENCOUNTER — Other Ambulatory Visit: Payer: Self-pay

## 2020-05-31 ENCOUNTER — Encounter (HOSPITAL_COMMUNITY): Payer: Self-pay | Admitting: Internal Medicine

## 2020-05-31 ENCOUNTER — Encounter (HOSPITAL_COMMUNITY)
Admission: RE | Disposition: A | Discharge status: Home / Self Care | Payer: Self-pay | Source: Home / Self Care | Attending: Internal Medicine

## 2020-05-31 DIAGNOSIS — Z955 Presence of coronary angioplasty implant and graft: Secondary | ICD-10-CM | POA: Insufficient documentation

## 2020-05-31 DIAGNOSIS — I10 Essential (primary) hypertension: Secondary | ICD-10-CM | POA: Diagnosis not present

## 2020-05-31 DIAGNOSIS — Z7901 Long term (current) use of anticoagulants: Secondary | ICD-10-CM | POA: Insufficient documentation

## 2020-05-31 DIAGNOSIS — E7800 Pure hypercholesterolemia, unspecified: Secondary | ICD-10-CM | POA: Diagnosis not present

## 2020-05-31 DIAGNOSIS — K2100 Gastro-esophageal reflux disease with esophagitis, without bleeding: Secondary | ICD-10-CM | POA: Diagnosis not present

## 2020-05-31 DIAGNOSIS — I2510 Atherosclerotic heart disease of native coronary artery without angina pectoris: Secondary | ICD-10-CM | POA: Insufficient documentation

## 2020-05-31 DIAGNOSIS — R1310 Dysphagia, unspecified: Secondary | ICD-10-CM

## 2020-05-31 DIAGNOSIS — I255 Ischemic cardiomyopathy: Secondary | ICD-10-CM | POA: Insufficient documentation

## 2020-05-31 DIAGNOSIS — F17210 Nicotine dependence, cigarettes, uncomplicated: Secondary | ICD-10-CM | POA: Insufficient documentation

## 2020-05-31 DIAGNOSIS — K222 Esophageal obstruction: Secondary | ICD-10-CM | POA: Diagnosis not present

## 2020-05-31 DIAGNOSIS — K229 Disease of esophagus, unspecified: Secondary | ICD-10-CM | POA: Diagnosis not present

## 2020-05-31 DIAGNOSIS — Z79899 Other long term (current) drug therapy: Secondary | ICD-10-CM | POA: Insufficient documentation

## 2020-05-31 DIAGNOSIS — K228 Other specified diseases of esophagus: Secondary | ICD-10-CM | POA: Diagnosis not present

## 2020-05-31 HISTORY — PX: ESOPHAGOGASTRODUODENOSCOPY: SHX5428

## 2020-05-31 HISTORY — PX: MALONEY DILATION: SHX5535

## 2020-05-31 SURGERY — EGD (ESOPHAGOGASTRODUODENOSCOPY)
Anesthesia: Moderate Sedation

## 2020-05-31 MED ORDER — ONDANSETRON HCL 4 MG/2ML IJ SOLN
INTRAMUSCULAR | Status: AC
  Filled 2020-05-31: qty 2

## 2020-05-31 MED ORDER — MEPERIDINE HCL 50 MG/ML IJ SOLN
INTRAMUSCULAR | Status: AC
  Filled 2020-05-31: qty 1

## 2020-05-31 MED ORDER — LIDOCAINE VISCOUS HCL 2 % MT SOLN
OROMUCOSAL | Status: AC
  Filled 2020-05-31: qty 15

## 2020-05-31 MED ORDER — MIDAZOLAM HCL 5 MG/5ML IJ SOLN
INTRAMUSCULAR | Status: DC | PRN
  Administered 2020-05-31 (×4): 1 mg via INTRAVENOUS
  Administered 2020-05-31: 2 mg via INTRAVENOUS
  Administered 2020-05-31: 1 mg via INTRAVENOUS

## 2020-05-31 MED ORDER — ONDANSETRON HCL 4 MG/2ML IJ SOLN
INTRAMUSCULAR | Status: DC | PRN
  Administered 2020-05-31: 4 mg via INTRAVENOUS

## 2020-05-31 MED ORDER — MEPERIDINE HCL 100 MG/ML IJ SOLN
INTRAMUSCULAR | Status: DC | PRN
  Administered 2020-05-31: 25 mg via INTRAVENOUS
  Administered 2020-05-31: 10 mg via INTRAVENOUS
  Administered 2020-05-31: 15 mg via INTRAVENOUS

## 2020-05-31 MED ORDER — MIDAZOLAM HCL 5 MG/5ML IJ SOLN
INTRAMUSCULAR | Status: AC
  Filled 2020-05-31: qty 10

## 2020-05-31 MED ORDER — SODIUM CHLORIDE 0.9 % IV SOLN: INTRAVENOUS | Status: DC

## 2020-05-31 MED ORDER — LIDOCAINE VISCOUS HCL 2 % MT SOLN
OROMUCOSAL | Status: DC | PRN
  Administered 2020-05-31: 6 mL via OROMUCOSAL

## 2020-05-31 NOTE — H&P (Signed)
Already done

## 2020-05-31 NOTE — Op Note (Signed)
[facility] Patient Name: Ronald Mcdowell Procedure Date: 05/31/2020 8:10 AM MRN: [id number] Date of Birth: 1946-10-02 Attending MD: Ronald Mcdowell , MD CSN: [id number] Age: 74 Admit Type: Outpatient Procedure:                Upper GI endoscopy Indications:              Dysphagia Providers:                Ronald Richards, MD, Hinton Rao, RN,                            Raphael Gibney, Technician Referring MD:              Medicines:                Midazolam 8 mg IV, Meperidine 50 mg IV, Ondansetron                            4 mg IV Complications:            No immediate complications. Estimated Blood Loss:     Estimated blood loss: 15 mL. Procedure:                Pre-Anesthesia Assessment:                           - Prior to the procedure, a History and Physical                            was performed, and patient medications and                            allergies were reviewed. The patient's tolerance of                            previous anesthesia was also reviewed. The risks                            and benefits of the procedure and the sedation                            options and risks were discussed with the patient.                            All questions were answered, and informed consent                            was obtained. Prior Anticoagulants: The patient                            last took Eliquis (apixaban) 4 days prior to the                            procedure. ASA Grade Assessment: III - A patient  with severe systemic disease. After reviewing the                            risks and benefits, the patient was deemed in                            satisfactory condition to undergo the procedure.                           After obtaining informed consent, the endoscope was                            passed under direct vision. Throughout the                            procedure, the patient's blood pressure,  pulse, and                            oxygen saturations were monitored continuously. The                            [id number]-[id number] ([id number]) scope was introduced through the                            mouth, and advanced to the second part of duodenum.                            The upper GI endoscopy was accomplished without                            difficulty. The patient tolerated the procedure                            well. Scope In: 8:49:38 AM Scope Out: 9:10:45 AM Total Procedure Duration: 0 hours 21 minutes 7 seconds  Findings:      Crpe paper appearing esophageal mucosa diffusely with a ringed       appearance. Multiple white tiny pustular areas scattered throughout the       esophageal body consistent with eosinophilic abscesses.. More narrowed       esophagus proximally. Was able to advance the adult scope through the       esophagus with mild resistance. Couple of tiny tears resulted with this       maneuver. Stomach normal. First and second portion of the duodenum       appeared normal. Scope withdrawn, a 48 French Maloney dilator was passed       to full insertion with mild to moderate resistance. Look back revealed       multiple superficial tears. About 15 cc of blood loss with this       maneuver. Subsequently, biopsies of mid and distal esophagus taken for       histologic study Impression:               Abnormal esophagus consistent with EOE. Status post  dilation and biopsy Moderate Sedation:      Moderate (conscious) sedation was administered by the endoscopy nurse       and supervised by the endoscopist. The following parameters were       monitored: oxygen saturation, heart rate, blood pressure, respiratory       rate, EKG, adequacy of pulmonary ventilation, and response to care.       Total physician intraservice time was 26 minutes. Recommendation:           - Patient has a contact number available for                             emergencies. The signs and symptoms of potential                            delayed complications were discussed with the                            patient. Return to normal activities tomorrow.                            Written discharge instructions were provided to the                            patient.                           -Swallowing precautions. Chopped meats. See the                            dentist about getting some false teeth. Resume                            Eliquis July 9. Continue twice daily Protonix.                            Anticipate adding topical budesonide in the near                            future. Office visit in 6 weeks. Procedure Code(s):        --- Professional ---                           [phone], Esophagogastroduodenoscopy, flexible,                            transoral; diagnostic, including collection of                            specimen(s) by brushing or washing, when performed                            (separate procedure)                           99153, Moderate sedation; each additional 15  minutes intraservice time                           G0500, Moderate sedation services provided by the                            same physician or other qualified health care                            professional performing a gastrointestinal                            endoscopic service that sedation supports,                            requiring the presence of an independent trained                            observer to assist in the monitoring of the                            patient's level of consciousness and physiological                            status; initial 15 minutes of intra-service time;                            patient age 33 years or older (additional time may                            be reported with [phone], as appropriate) Diagnosis Code(s):        --- Professional ---                            R13.10, Dysphagia, unspecified CPT copyright 2019 American Medical Association. All rights reserved. The codes documented in this report are preliminary and upon coder review may  be revised to meet current compliance requirements. Ronald Mcdowell. Ronald Popoff, MD Ronald Richards, MD 05/31/2020 9:25:22 AM This report has been signed electronically. Number of Addenda: 0

## 2020-05-31 NOTE — H&P (Signed)
@LOGO @   Primary Care Physician:  Doree Albee, MD Primary Gastroenterologist:  Dr. Gala Romney  Pre-Procedure History & Physical: HPI:  Ronald Mcdowell is a 74 y.o. male here for   Past Medical History:  Diagnosis Date  . Anemia   . Coronary artery disease    a. h/o MI in 2000 w/ prior LCX stenting;  b. 7.2014 Abnl Cardiolite, EF 41%, mod-large inferolat scar;  c. 07/2013 Cath/PCI: LM nl, LAD 10-20, D1 40-50p, LCX 95-99 @ distal stent margin (2.5x20 Promus Premier DES), RCA dom, 40p, 31m, 70d, EF 35-40%.  . Elevated cholesterol   . GERD (gastroesophageal reflux disease)   . Hemorrhoids   . Hypertension   . Ischemic cardiomyopathy    a. 07/2013 EF 35-40% by LV gram.  . Myocardial infarction ([place])   . Reflux esophagitis   . Spondylosis of cervical joint    C3-4, C6-7  . Tobacco user   . Vitamin D deficiency disease 08/24/2019    Past Surgical History:  Procedure Laterality Date  . BIOPSY  01/09/2018   Procedure: BIOPSY;  Surgeon: Daneil Dolin, MD;  Location: [facility];  Service: Endoscopy;;  esophageal biopsies  . CARDIAC CATHETERIZATION    . COLONOSCOPY    . COLONOSCOPY N/A 01/25/2016   RMR: normal ileocolonoscopy ( anal canal hemorrhoids)  . CORONARY ANGIOPLASTY WITH STENT PLACEMENT  08/23/2013   mid circumflex  DES    by Dr Martinique  . CORONARY STENT PLACEMENT  2000  . ESOPHAGOGASTRODUODENOSCOPY N/A 01/25/2016   RMR: Reflux esophagitits. Cervical web with passage of the scope. Status post esophageal biosy. Hiatal hernia.   [name] ESOPHAGOGASTRODUODENOSCOPY N/A 01/09/2018   Elio Haden: cervical/proximal esophageal web s/p dilation, markedly abnormal esophagus c/w biopsy proven eosinophilic esophagitis. small hiatal hernia.  [name] LEFT HEART CATHETERIZATION WITH CORONARY ANGIOGRAM N/A 08/23/2013   Procedure: LEFT HEART CATHETERIZATION WITH CORONARY ANGIOGRAM;  Surgeon: Peter M Martinique, MD;  Location: [facility] CATH LAB;  Service: Cardiovascular;  Laterality: N/A;  . [name] DILATION N/A 01/09/2018    Procedure: [name] DILATION;  Surgeon: Daneil Dolin, MD;  Location: [facility];  Service: Endoscopy;  Laterality: N/A;  . TRANSURETHRAL RESECTION OF PROSTATE  01/09/2012   Procedure: TRANSURETHRAL RESECTION OF THE PROSTATE (TURP);  Surgeon: Marissa Nestle, MD;  Location: [facility];  Service: Urology;  Laterality: N/A;    Prior to Admission medications   Medication Sig Start Date End Date Taking? Authorizing Provider  amLODipine (NORVASC) 5 MG tablet TAKE 1 TABLET BY MOUTH  DAILY Patient taking differently: Take 5 mg by mouth daily.  02/15/20  Yes Gosrani, Nimish C, MD  apixaban (ELIQUIS) 5 MG TABS tablet TAKE 1 TABLET(5 MG) BY MOUTH TWICE DAILY Patient taking differently: Take 5 mg by mouth 2 (two) times daily.  12/15/19  Yes Herminio Commons, MD  atorvastatin (LIPITOR) 40 MG tablet TAKE 1 TABLET BY MOUTH  DAILY Patient taking differently: Take 40 mg by mouth at bedtime.  04/02/20 07/01/20 Yes Gosrani, Nimish C, MD  bisoprolol (ZEBETA) 5 MG tablet TAKE 1 TABLET BY MOUTH  DAILY Patient taking differently: Take 5 mg by mouth daily.  12/29/19  Yes Ailene Ards, NP  Cholecalciferol (VITAMIN D-3) 25 MCG (1000 UT) CAPS Take 1,000 Units by mouth daily.   Yes [provider]  lisinopril (ZESTRIL) 20 MG tablet TAKE 1 TABLET BY MOUTH  DAILY Patient taking differently: Take 10 mg by mouth daily.  03/14/20  Yes Doree Albee, MD  nitroGLYCERIN (NITROSTAT)  0.4 MG SL tablet Place 1 tablet (0.4 mg total) under the tongue every 5 (five) minutes as needed for chest pain. 12/01/19  Yes Ailene Ards, NP  pantoprazole (PROTONIX) 40 MG tablet TAKE 1 TABLET BY MOUTH  DAILY Patient taking differently: Take 40 mg by mouth 2 (two) times daily.  03/24/20  Yes Gosrani, Nimish C, MD  potassium chloride (KLOR-CON) 10 MEQ tablet Take 2 tablets (20 mEq total) by mouth daily. 02/07/20 05/22/20 Yes Herminio Commons, MD  sucralfate (CARAFATE) 1 GM/10ML suspension Take 10 mLs (1 g total) by mouth 4 (four) times  daily -  with meals and at bedtime. Patient not taking: Reported on 05/22/2020 04/19/20   Mahala Menghini, PA-C  amLODipine (NORVASC) 5 MG tablet Take 1 tablet (5 mg total) by mouth daily. 01/04/20   Herminio Commons, MD  atorvastatin (LIPITOR) 40 MG tablet Take 1 tablet (40 mg total) by mouth at bedtime. Reported on 11/30/2015 12/24/17   Raylene Everts, MD  atorvastatin (LIPITOR) 40 MG tablet TAKE 1 TABLET(40 MG) BY MOUTH DAILY Patient taking differently: Take 40 mg by mouth daily.  03/07/20   Ailene Ards, NP  bisoprolol (ZEBETA) 5 MG tablet TAKE 1 TABLET BY MOUTH DAILY 10/16/19   Hurshel Party C, MD  lisinopril (ZESTRIL) 20 MG tablet Take 20 mg by mouth daily.    [provider]    Allergies as of 05/17/2020  . (No Known Allergies)    Family History  Problem Relation Age of Onset  . Early death Mother        childbirth  . Heart attack Father   . Cancer Father   . Heart disease Brother        CABG  . Heart disease Brother        slight heart attack  . Anesthesia problems Neg Hx   . Hypotension Neg Hx   . Malignant hyperthermia Neg Hx   . Pseudochol deficiency Neg Hx   . Colon cancer Neg Hx   . Liver disease Neg Hx   . Gastric cancer Neg Hx   . Esophageal cancer Neg Hx     Social History   Socioeconomic History  . Marital status: Married    Spouse name: [name]  . Number of children: Not on file  . Years of education: 11th grade  . Highest education level: 11th grade  Occupational History  . Occupation: Librarian, academic  Tobacco Use  . Smoking status: Current Every Day Smoker    Packs/day: 1.00    Years: 53.00    Pack years: 53.00    Types: Cigarettes    Start date: 12/09/1965  . Smokeless tobacco: Never Used  . Tobacco comment: 3/4 pack of cigarettes daily; smoked since 74 years old  Vaping Use  . Vaping Use: Never used  Substance and Sexual Activity  . Alcohol use: No    Alcohol/week: 0.0 standard drinks    Comment: No bowel since year 2000.  Some  previous heavy use.  . Drug use: No  . Sexual activity: Yes    Birth control/protection: None  Other Topics Concern  . 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   Social Determinants of Health   Financial Resource Strain:   . Difficulty of Paying Living Expenses:   Food Insecurity:   . Worried About Charity fundraiser in the Last Year:   . Ran  Out of Food in the Last Year:   Transportation Needs:   . Lack of Transportation (Medical):   [name] Lack of Transportation (Non-Medical):   Physical Activity:   . Days of Exercise per Week:   . Minutes of Exercise per Session:   Stress:   . Feeling of Stress :   Social Connections:   . Frequency of Communication with Friends and Family:   . Frequency of Social Gatherings with Friends and Family:   . Attends Religious Services:   . Active Member of Clubs or Organizations:   . Attends Archivist Meetings:   [name] Marital Status:   Intimate Partner Violence:   . Fear of Current or Ex-Partner:   . Emotionally Abused:   [name] Physically Abused:   . Sexually Abused:     Review of Systems: See HPI, otherwise negative ROS  Physical Exam: BP (!) 149/73   Pulse 66   Temp 98.2 F (36.8 C) (Oral)   Resp 20   Ht 5\' 5"  (1.651 m)   Wt 52.2 kg   SpO2 100%   BMI 19.14 kg/m  General:   Alert,  Well-developed, well-nourished, pleasant and cooperative in NAD Neck:  Supple; no masses or thyromegaly. No significant cervical adenopathy. Lungs:  Clear throughout to auscultation.   No wheezes, crackles, or rhonchi. No acute distress. Heart:  Regular rate and rhythm; no murmurs, clicks, rubs,  or gallops. Abdomen: Non-distended, normal bowel sounds.  Soft and nontender without appreciable mass or hepatosplenomegaly.  Pulses:  Normal pulses noted. Extremities:  Without clubbing or edema.  Impression/Plan: 74 year old gentleman history of eosinophil  esophagitis with recurrent odynophagia and dysphagia.  Your for further evaluation management via EGD and possible esophageal dilation as feasible/appropriate per plan. The risks, benefits, limitations, alternatives and imponderables have been reviewed with the patient. Potential for esophageal dilation, biopsy, etc. have also been reviewed.  Questions have been answered. All parties agreeable.     Notice: This dictation was prepared with Dragon dictation along with smaller phrase technology. Any transcriptional errors that result from this process are unintentional and may not be corrected upon review.

## 2020-05-31 NOTE — Discharge Instructions (Signed)
EGD Discharge instructions Please read the instructions outlined below and refer to this sheet in the next few weeks. These discharge instructions provide you with general information on caring for yourself after you leave the hospital. Your doctor may also give you specific instructions. While your treatment has been planned according to the most current medical practices available, unavoidable complications occasionally occur. If you have any problems or questions after discharge, please call your doctor. ACTIVITY  You may resume your regular activity but move at a slower pace for the next 24 hours.   Take frequent rest periods for the next 24 hours.   Walking will help expel (get rid of) the air and reduce the bloated feeling in your abdomen.   No driving for 24 hours (because of the anesthesia (medicine) used during the test).   You may shower.   Do not sign any important legal documents or operate any machinery for 24 hours (because of the anesthesia used during the test).  NUTRITION  Drink plenty of fluids.   You may resume your normal diet.   Begin with a light meal and progress to your normal diet.   Avoid alcoholic beverages for 24 hours or as instructed by your caregiver.  MEDICATIONS  You may resume your normal medications unless your caregiver tells you otherwise.  WHAT YOU CAN EXPECT TODAY  You may experience abdominal discomfort such as a feeling of fullness or "gas" pains.  FOLLOW-UP  Your doctor will discuss the results of your test with you.  SEEK IMMEDIATE MEDICAL ATTENTION IF ANY OF THE FOLLOWING OCCUR:  Excessive nausea (feeling sick to your stomach) and/or vomiting.   Severe abdominal pain and distention (swelling).   Trouble swallowing.   Temperature over 101 F (37.8 C).   Rectal bleeding or vomiting of blood.   Your esophagus was dilated today.  Your esophagus biopsied today  Continue Protonix 40 mg twice daily  Chopped meats; chew food  thoroughly.  See dentist about getting some false teeth  Resume Eliquis 06/02/2020  Further recommendations to follow pending review of pathology report  Office visit with [place] in 6 weeks  At patient request I called Tia Alert at [phone] -left message on voicemail

## 2020-06-01 LAB — SURGICAL PATHOLOGY

## 2020-06-03 ENCOUNTER — Encounter: Payer: Self-pay | Admitting: Internal Medicine

## 2020-06-05 ENCOUNTER — Telehealth: Payer: Self-pay

## 2020-06-05 NOTE — Telephone Encounter (Signed)
Per RMR- Send letter to patient.  Send copy of letter with path to referring provider and PCP.  Needs ov in 6 weeks; To decide about timing of repeat EGD (PPI responsive eosinophillia vs. EOE - I favor the latter). Probably needs repeat EGD with repeat dilation after f/u appt -will then decide about topical steroids

## 2020-06-07 ENCOUNTER — Encounter (INDEPENDENT_AMBULATORY_CARE_PROVIDER_SITE_OTHER): Payer: Self-pay | Admitting: Internal Medicine

## 2020-06-07 ENCOUNTER — Ambulatory Visit (INDEPENDENT_AMBULATORY_CARE_PROVIDER_SITE_OTHER): Payer: Medicare Other | Admitting: Internal Medicine

## 2020-06-07 ENCOUNTER — Other Ambulatory Visit: Payer: Self-pay

## 2020-06-07 ENCOUNTER — Encounter (INDEPENDENT_AMBULATORY_CARE_PROVIDER_SITE_OTHER): Payer: Self-pay

## 2020-06-07 VITALS — BP 145/70 | HR 86 | Temp 97.7°F | Ht 65.0 in | Wt 119.6 lb

## 2020-06-07 DIAGNOSIS — M79641 Pain in right hand: Secondary | ICD-10-CM | POA: Diagnosis not present

## 2020-06-07 DIAGNOSIS — R42 Dizziness and giddiness: Secondary | ICD-10-CM | POA: Diagnosis not present

## 2020-06-07 DIAGNOSIS — M79642 Pain in left hand: Secondary | ICD-10-CM | POA: Diagnosis not present

## 2020-06-07 DIAGNOSIS — E0590 Thyrotoxicosis, unspecified without thyrotoxic crisis or storm: Secondary | ICD-10-CM | POA: Diagnosis not present

## 2020-06-07 NOTE — Progress Notes (Signed)
Metrics: Intervention Frequency ACO  Documented Smoking Status Yearly  Screened one or more times in 24 months  Cessation Counseling or  Active cessation medication Past 24 months  Past 24 months   Guideline developer: UpToDate (See UpToDate for funding source) Date Released: 2014       Wellness Office Visit  Subjective:  Patient ID: Ronald Mcdowell, male    DOB: Dec 29, 1945  Age: 74 y.o. MRN: [id number]  CC: This man comes in for an acute visit with chief complaint of dizziness. HPI  He is very frustrated that the dizziness has not improved.  He has had dizziness for several months now.  The does not appear to be any obvious etiology.  He has recently been diagnosed with hyperthyroidism and did have radioactive iodine about 3 weeks ago.  He is going to follow-up with Dr. Dorris Fetch for repeat thyroid function test post procedure. He also describes cramping in his hands. As far as the dizziness is concerned, he thankfully, he has not had any falls but he feels very unsteady with gait.  He can become dizzy even if he is sitting.  He himself does not describe any speech difficulties.  He has had no confusion. Past Medical History:  Diagnosis Date  . Anemia   . Coronary artery disease    a. h/o MI in 2000 w/ prior LCX stenting;  b. 7.2014 Abnl Cardiolite, EF 41%, mod-large inferolat scar;  c. 07/2013 Cath/PCI: LM nl, LAD 10-20, D1 40-50p, LCX 95-99 @ distal stent margin (2.5x20 Promus Premier DES), RCA dom, 40p, 10m, 70d, EF 35-40%.  . Elevated cholesterol   . GERD (gastroesophageal reflux disease)   . Hemorrhoids   . Hypertension   . Ischemic cardiomyopathy    a. 07/2013 EF 35-40% by LV gram.  . Myocardial infarction ([place])   . Reflux esophagitis   . Spondylosis of cervical joint    C3-4, C6-7  . Tobacco user   . Vitamin D deficiency disease 08/24/2019   Past Surgical History:  Procedure Laterality Date  . BIOPSY  01/09/2018   Procedure: BIOPSY;  Surgeon: Daneil Dolin, MD;  Location: [facility];  Service: Endoscopy;;  esophageal biopsies  . CARDIAC CATHETERIZATION    . COLONOSCOPY    . COLONOSCOPY N/A 01/25/2016   RMR: normal ileocolonoscopy ( anal canal hemorrhoids)  . CORONARY ANGIOPLASTY WITH STENT PLACEMENT  08/23/2013   mid circumflex  DES    by Dr Martinique  . CORONARY STENT PLACEMENT  2000  . ESOPHAGOGASTRODUODENOSCOPY N/A 01/25/2016   RMR: Reflux esophagitits. Cervical web with passage of the scope. Status post esophageal biosy. Hiatal hernia.   [name] ESOPHAGOGASTRODUODENOSCOPY N/A 01/09/2018   Rourk: cervical/proximal esophageal web s/p dilation, markedly abnormal esophagus c/w biopsy proven eosinophilic esophagitis. small hiatal hernia.  [name] LEFT HEART CATHETERIZATION WITH CORONARY ANGIOGRAM N/A 08/23/2013   Procedure: LEFT HEART CATHETERIZATION WITH CORONARY ANGIOGRAM;  Surgeon: Peter M Martinique, MD;  Location: [facility] CATH LAB;  Service: Cardiovascular;  Laterality: N/A;  . [name] DILATION N/A 01/09/2018   Procedure: [name] DILATION;  Surgeon: Daneil Dolin, MD;  Location: [facility];  Service: Endoscopy;  Laterality: N/A;  . TRANSURETHRAL RESECTION OF PROSTATE  01/09/2012   Procedure: TRANSURETHRAL RESECTION OF THE PROSTATE (TURP);  Surgeon: Marissa Nestle, MD;  Location: [facility];  Service: Urology;  Laterality: N/A;     Family History  Problem Relation Age of Onset  . Early death Mother        childbirth  .  Heart attack Father   . Cancer Father   . Heart disease Brother        CABG  . Heart disease Brother        slight heart attack  . Anesthesia problems Neg Hx   . Hypotension Neg Hx   . Malignant hyperthermia Neg Hx   . Pseudochol deficiency Neg Hx   . Colon cancer Neg Hx   . Liver disease Neg Hx   . Gastric cancer Neg Hx   . 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   Social History   Tobacco Use  . Smoking status:  Current Every Day Smoker    Packs/day: 1.00    Years: 53.00    Pack years: 53.00    Types: Cigarettes    Start date: 12/09/1965  . Smokeless tobacco: Never Used  . Tobacco comment: 3/4 pack of cigarettes daily; smoked since 74 years old  Substance Use Topics  . Alcohol use: No    Alcohol/week: 0.0 standard drinks    Comment: No bowel since year 2000.  Some previous heavy use.    Current Meds  Medication Sig  . amLODipine (NORVASC) 5 MG tablet TAKE 1 TABLET BY MOUTH  DAILY (Patient taking differently: Take 5 mg by mouth daily. )  . apixaban (ELIQUIS) 5 MG TABS tablet TAKE 1 TABLET(5 MG) BY MOUTH TWICE DAILY (Patient taking differently: Take 5 mg by mouth 2 (two) times daily. )  . atorvastatin (LIPITOR) 40 MG tablet TAKE 1 TABLET BY MOUTH  DAILY (Patient taking differently: Take 40 mg by mouth at bedtime. )  . bisoprolol (ZEBETA) 5 MG tablet TAKE 1 TABLET BY MOUTH  DAILY (Patient taking differently: Take 5 mg by mouth daily. )  . Cholecalciferol (VITAMIN D-3) 25 MCG (1000 UT) CAPS Take 1,000 Units by mouth daily.  [name] lisinopril (ZESTRIL) 20 MG tablet TAKE 1 TABLET BY MOUTH  DAILY (Patient taking differently: Take 10 mg by mouth daily. )  . nitroGLYCERIN (NITROSTAT) 0.4 MG SL tablet Place 1 tablet (0.4 mg total) under the tongue every 5 (five) minutes as needed for chest pain.  . pantoprazole (PROTONIX) 40 MG tablet TAKE 1 TABLET BY MOUTH  DAILY (Patient taking differently: Take 40 mg by mouth 2 (two) times daily. )  . potassium chloride (KLOR-CON) 10 MEQ tablet Take 2 tablets (20 mEq total) by mouth daily.      Depression screen [facility] 2/9 03/09/2020 11/04/2017  Decreased Interest 0 0  Down, Depressed, Hopeless 0 0  PHQ - 2 Score 0 0     Objective:   Today's Vitals: BP (!) 145/70 (BP Location: Left Arm, Patient Position: Sitting, Cuff Size: Normal)   Pulse 86   Temp 97.7 F (36.5 C) (Temporal)   Ht 5\' 5"  (1.651 m)   Wt 119 lb 9.6 oz (54.3 kg)   SpO2 99%   BMI 19.90 kg/m  Vitals  with BMI 06/07/2020 05/31/2020 05/31/2020  Height 5\' 5"  - -  Weight 119 lbs 10 oz - -  BMI 41.9 - -  Systolic 379 024 097  Diastolic 70 56 55  Pulse 86 75 74     Physical Exam  He is alert and orientated.  There is no nystagmus.  Finger-to-nose test shows I believe terminal tremor.  He does not have any evidence of dysdiadochokinesis.  Heel-to-toe walking is very unsteady.  There is no obvious focal neurological signs in his limbs and he has equal strength bilaterally.  There is no obvious facial asymmetry.     Assessment   1. Dizziness   2. Hyperthyroidism   3. Pain in both hands       Tests ordered Orders Placed This Encounter  Procedures  . COMPLETE METABOLIC PANEL WITH GFR  . CBC  . Ambulatory referral to Neurology     Plan: 1. I am not sure what the etiology of the dizziness is.  I think he does need further evaluation and I will refer him to neurology. 2. As far as his hypothyroidism is concerned, he has already had radioactive iodine treatment and he will follow with the endocrinologist. 3. As far as his cramping in his hands, I will make sure his CBC and complete metabolic panel are normal and further recommendations will be based on these results. 4. Follow-up with me in a couple of months.   No orders of the defined types were placed in this encounter.   Doree Albee, MD

## 2020-06-07 NOTE — Telephone Encounter (Signed)
OV made °

## 2020-06-08 ENCOUNTER — Ambulatory Visit (INDEPENDENT_AMBULATORY_CARE_PROVIDER_SITE_OTHER): Payer: Medicare Other | Admitting: Internal Medicine

## 2020-06-08 ENCOUNTER — Encounter (INDEPENDENT_AMBULATORY_CARE_PROVIDER_SITE_OTHER): Payer: Self-pay | Admitting: Internal Medicine

## 2020-06-08 DIAGNOSIS — E559 Vitamin D deficiency, unspecified: Secondary | ICD-10-CM

## 2020-06-08 DIAGNOSIS — D649 Anemia, unspecified: Secondary | ICD-10-CM | POA: Diagnosis not present

## 2020-06-08 LAB — COMPLETE METABOLIC PANEL WITH GFR
AG Ratio: 1.1 (calc) (ref 1.0–2.5)
ALT: 45 U/L (ref 9–46)
AST: 28 U/L (ref 10–35)
Albumin: 3.5 g/dL — ABNORMAL LOW (ref 3.6–5.1)
Alkaline phosphatase (APISO): 70 U/L (ref 35–144)
BUN: 25 mg/dL (ref 7–25)
CO2: 22 mmol/L (ref 20–32)
Calcium: 5.8 mg/dL — CL (ref 8.6–10.3)
Chloride: 111 mmol/L — ABNORMAL HIGH (ref 98–110)
Creat: 1.14 mg/dL (ref 0.70–1.18)
GFR, Est African American: 74 mL/min/{1.73_m2} (ref >=60)
GFR, Est Non African American: 64 mL/min/{1.73_m2} (ref >=60)
Globulin: 3.3 g/dL (calc) (ref 1.9–3.7)
Glucose, Bld: 93 mg/dL (ref 65–99)
Potassium: 4.5 mmol/L (ref 3.5–5.3)
Sodium: 143 mmol/L (ref 135–146)
Total Bilirubin: 0.4 mg/dL (ref 0.2–1.2)
Total Protein: 6.8 g/dL (ref 6.1–8.1)

## 2020-06-08 LAB — CBC
HCT: 27.9 % — ABNORMAL LOW (ref 38.5–50.0)
Hemoglobin: 9 g/dL — ABNORMAL LOW (ref 13.2–17.1)
MCH: 27.4 pg (ref 27.0–33.0)
MCHC: 32.3 g/dL (ref 32.0–36.0)
MCV: 84.8 fL (ref 80.0–100.0)
MPV: 10.7 fL (ref 7.5–12.5)
Platelets: 192 10*3/uL (ref 140–400)
RBC: 3.29 10*6/uL — ABNORMAL LOW (ref 4.20–5.80)
RDW: 15.2 % — ABNORMAL HIGH (ref 11.0–15.0)
WBC: 10.7 10*3/uL (ref 3.8–10.8)

## 2020-06-08 NOTE — Progress Notes (Signed)
Metrics: Intervention Frequency ACO  Documented Smoking Status Yearly  Screened one or more times in 24 months  Cessation Counseling or  Active cessation medication Past 24 months  Past 24 months   Guideline developer: UpToDate (See UpToDate for funding source) Date Released: 2014       Wellness Office Visit  Subjective:  Patient ID: Ronald Mcdowell, male    DOB: [birth year]  Age: 74 y.o. MRN: [id number]  CC: This man comes in for follow-up at my request for discussion regarding his blood work which shows anemia and hypocalcemia. HPI  His hemoglobin was around 13 just a month ago and now it is at 9.  His calcium is down at 5.8.  This would explain the cramping he has in his hands. He did receive radioactive iodine for his hyperthyroidism approximately 3 weeks ago. Past Medical History:  Diagnosis Date  . Anemia   . Coronary artery disease    a. h/o MI in 2000 w/ prior LCX stenting;  b. 7.2014 Abnl Cardiolite, EF 41%, mod-large inferolat scar;  c. 07/2013 Cath/PCI: LM nl, LAD 10-20, D1 40-50p, LCX 95-99 @ distal stent margin (2.5x20 Promus Premier DES), RCA dom, 40p, 74m, 70d, EF 35-40%.  . Elevated cholesterol   . GERD (gastroesophageal reflux disease)   . Hemorrhoids   . Hypertension   . Ischemic cardiomyopathy    a. 07/2013 EF 35-40% by LV gram.  . Myocardial infarction ([place])   . Reflux esophagitis   . Spondylosis of cervical joint    C3-4, C6-7  . Tobacco user   . Vitamin D deficiency disease 08/24/2019   Past Surgical History:  Procedure Laterality Date  . BIOPSY  01/09/2018   Procedure: BIOPSY;  Surgeon: Daneil Dolin, MD;  [facility];  Service: Endoscopy;;  esophageal biopsies  . CARDIAC CATHETERIZATION    . COLONOSCOPY    . COLONOSCOPY N/A 01/25/2016   RMR: normal ileocolonoscopy ( anal canal hemorrhoids)  . CORONARY ANGIOPLASTY WITH STENT PLACEMENT  08/23/2013   mid circumflex  DES    by Dr Martinique  . CORONARY STENT PLACEMENT  2000  .  ESOPHAGOGASTRODUODENOSCOPY N/A 01/25/2016   RMR: Reflux esophagitits. Cervical web with passage of the scope. Status post esophageal biosy. Hiatal hernia.   [name] ESOPHAGOGASTRODUODENOSCOPY N/A 01/09/2018   Rourk: cervical/proximal esophageal web s/p dilation, markedly abnormal esophagus c/w biopsy proven eosinophilic esophagitis. small hiatal hernia.  [name] ESOPHAGOGASTRODUODENOSCOPY N/A 05/31/2020   Procedure: ESOPHAGOGASTRODUODENOSCOPY (EGD);  Surgeon: Daneil Dolin, MD;  [facility];  Service: Endoscopy;  Laterality: N/A;  8:30am  . LEFT HEART CATHETERIZATION WITH CORONARY ANGIOGRAM N/A 08/23/2013   Procedure: LEFT HEART CATHETERIZATION WITH CORONARY ANGIOGRAM;  Surgeon: Peter M Martinique, MD;  Location: [facility] CATH LAB;  Service: Cardiovascular;  Laterality: N/A;  . [name] DILATION N/A 01/09/2018   Procedure: [name] DILATION;  Surgeon: Daneil Dolin, MD;  [facility];  Service: Endoscopy;  Laterality: N/A;  . [name] DILATION N/A 05/31/2020   Procedure: [name] DILATION;  Surgeon: Daneil Dolin, MD;  [facility];  Service: Endoscopy;  Laterality: N/A;  . TRANSURETHRAL RESECTION OF PROSTATE  01/09/2012   Procedure: TRANSURETHRAL RESECTION OF THE PROSTATE (TURP);  Surgeon: Marissa Nestle, MD;  Location: [facility];  Service: Urology;  Laterality: N/A;     Family History  Problem Relation Age of Onset  . Early death Mother        childbirth  . Heart attack Father   .  Cancer Father   . Heart disease Brother        CABG  . Heart disease Brother        slight heart attack  . Anesthesia problems Neg Hx   . Hypotension Neg Hx   . Malignant hyperthermia Neg Hx   . Pseudochol deficiency Neg Hx   . Colon cancer Neg Hx   . Liver disease Neg Hx   . Gastric cancer Neg Hx   . 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   Social History    Tobacco Use  . Smoking status: Current Every Day Smoker    Packs/day: 1.00    Years: 53.00    Pack years: 53.00    Types: Cigarettes    Start date: 12/09/1965  . Smokeless tobacco: Never Used  . Tobacco comment: 3/4 pack of cigarettes daily; smoked since 74 years old  Substance Use Topics  . Alcohol use: No    Alcohol/week: 0.0 standard drinks    Comment: No bowel since year 2000.  Some previous heavy use.    Current Meds  Medication Sig  . amLODipine (NORVASC) 5 MG tablet TAKE 1 TABLET BY MOUTH  DAILY (Patient taking differently: Take 5 mg by mouth daily. )  . apixaban (ELIQUIS) 5 MG TABS tablet TAKE 1 TABLET(5 MG) BY MOUTH TWICE DAILY (Patient taking differently: Take 5 mg by mouth 2 (two) times daily. )  . atorvastatin (LIPITOR) 40 MG tablet TAKE 1 TABLET BY MOUTH  DAILY (Patient taking differently: Take 40 mg by mouth at bedtime. )  . bisoprolol (ZEBETA) 5 MG tablet TAKE 1 TABLET BY MOUTH  DAILY (Patient taking differently: Take 5 mg by mouth daily. )  . Cholecalciferol (VITAMIN D-3) 125 MCG (5000 UT) TABS Take 1 tablet by mouth daily.  [name] lisinopril (ZESTRIL) 20 MG tablet TAKE 1 TABLET BY MOUTH  DAILY (Patient taking differently: Take 10 mg by mouth daily. )  . nitroGLYCERIN (NITROSTAT) 0.4 MG SL tablet Place 1 tablet (0.4 mg total) under the tongue every 5 (five) minutes as needed for chest pain.  . pantoprazole (PROTONIX) 40 MG tablet TAKE 1 TABLET BY MOUTH  DAILY (Patient taking differently: Take 40 mg by mouth 2 (two) times daily. )  . [DISCONTINUED] Cholecalciferol (VITAMIN D-3) 25 MCG (1000 UT) CAPS Take 1,000 Units by mouth daily.      Depression screen [facility] 2/9 03/09/2020 11/04/2017  Decreased Interest 0 0  Down, Depressed, Hopeless 0 0  PHQ - 2 Score 0 0     Objective:   Today's Vitals: There were no vitals taken for this visit. Vitals with BMI 06/07/2020 05/31/2020 05/31/2020  Height 5\' 5"  - -  Weight 119 lbs 10 oz - -  BMI 67.3 - -  Systolic 419 379 024   Diastolic 70 56 55  Pulse 86 75 74     Physical Exam  He looks systemically well.  No new physical findings today.     Assessment   1. Hypocalcemia   2. Anemia, unspecified type   3. Vitamin D deficiency disease       Tests ordered Orders Placed This Encounter  Procedures  . Ferritin  . Iron,Total/Total Iron Binding Cap  . B12 and Folate Panel  . PTH, Intact and Calcium  . VITAMIN D 25 Hydroxy (Vit-D Deficiency, Fractures)     Plan: 1.  I will check calcium and PTH levels for his hypocalcemia.  We will also check vitamin D levels.  He does take vitamin D3 5000 units daily. 2. I will also check hematinics for his anemia. 3. Further recommendations will depend on these results.   No orders of the defined types were placed in this encounter.   Doree Albee, MD

## 2020-06-08 NOTE — Progress Notes (Signed)
Please call this patient back.  Tell him that he has become anemic now and also his calcium levels are very low which would explain the symptoms he is having in his hands.Please schedule him an appointment to see me soon, either today or early next week.  Thanks.

## 2020-06-08 NOTE — Progress Notes (Signed)
Called; he will be here today at 3 pm.

## 2020-06-09 LAB — IRON,TIBC AND FERRITIN PANEL
%SAT: 25 % (calc) (ref 20–48)
Ferritin: 473 ng/mL — ABNORMAL HIGH (ref 24–380)
Iron: 50 ug/dL (ref 50–180)
TIBC: 204 mcg/dL (calc) — ABNORMAL LOW (ref 250–425)

## 2020-06-09 LAB — B12 AND FOLATE PANEL
Folate: 14.4 ng/mL
Vitamin B-12: 428 pg/mL (ref 200–1100)

## 2020-06-09 LAB — PTH, INTACT AND CALCIUM
Calcium: 5.6 mg/dL — CL (ref 8.6–10.3)
PTH: 25 pg/mL (ref 14–64)

## 2020-06-09 LAB — VITAMIN D 25 HYDROXY (VIT D DEFICIENCY, FRACTURES): Vit D, 25-Hydroxy: 52 ng/mL (ref 30–100)

## 2020-06-12 ENCOUNTER — Other Ambulatory Visit: Payer: Self-pay

## 2020-06-12 ENCOUNTER — Emergency Department (HOSPITAL_COMMUNITY): Payer: Medicare Other

## 2020-06-12 ENCOUNTER — Emergency Department (HOSPITAL_COMMUNITY)
Admission: EM | Admit: 2020-06-12 | Discharge: 2020-06-12 | Disposition: A | Discharge status: Home / Self Care | Payer: Medicare Other | Attending: Emergency Medicine | Admitting: Emergency Medicine

## 2020-06-12 ENCOUNTER — Encounter (HOSPITAL_COMMUNITY): Payer: Self-pay

## 2020-06-12 DIAGNOSIS — Z79899 Other long term (current) drug therapy: Secondary | ICD-10-CM | POA: Diagnosis not present

## 2020-06-12 DIAGNOSIS — I10 Essential (primary) hypertension: Secondary | ICD-10-CM | POA: Diagnosis not present

## 2020-06-12 DIAGNOSIS — Z955 Presence of coronary angioplasty implant and graft: Secondary | ICD-10-CM | POA: Diagnosis not present

## 2020-06-12 DIAGNOSIS — R42 Dizziness and giddiness: Secondary | ICD-10-CM | POA: Diagnosis not present

## 2020-06-12 DIAGNOSIS — I252 Old myocardial infarction: Secondary | ICD-10-CM | POA: Insufficient documentation

## 2020-06-12 DIAGNOSIS — R531 Weakness: Secondary | ICD-10-CM | POA: Diagnosis not present

## 2020-06-12 DIAGNOSIS — I4891 Unspecified atrial fibrillation: Secondary | ICD-10-CM | POA: Insufficient documentation

## 2020-06-12 DIAGNOSIS — F17210 Nicotine dependence, cigarettes, uncomplicated: Secondary | ICD-10-CM | POA: Diagnosis not present

## 2020-06-12 DIAGNOSIS — I2510 Atherosclerotic heart disease of native coronary artery without angina pectoris: Secondary | ICD-10-CM | POA: Insufficient documentation

## 2020-06-12 DIAGNOSIS — Z7901 Long term (current) use of anticoagulants: Secondary | ICD-10-CM | POA: Diagnosis not present

## 2020-06-12 DIAGNOSIS — Z8673 Personal history of transient ischemic attack (TIA), and cerebral infarction without residual deficits: Secondary | ICD-10-CM | POA: Diagnosis not present

## 2020-06-12 LAB — CBC WITH DIFFERENTIAL/PLATELET
Abs Immature Granulocytes: 0.03 10*3/uL (ref 0.00–0.07)
Basophils Absolute: 0 10*3/uL (ref 0.0–0.1)
Basophils Relative: 0 %
Eosinophils Absolute: 0.2 10*3/uL (ref 0.0–0.5)
Eosinophils Relative: 2 %
HCT: 29.7 % — ABNORMAL LOW (ref 39.0–52.0)
Hemoglobin: 9.3 g/dL — ABNORMAL LOW (ref 13.0–17.0)
Immature Granulocytes: 0 %
Lymphocytes Relative: 18 %
Lymphs Abs: 2.1 10*3/uL (ref 0.7–4.0)
MCH: 28 pg (ref 26.0–34.0)
MCHC: 31.3 g/dL (ref 30.0–36.0)
MCV: 89.5 fL (ref 80.0–100.0)
Monocytes Absolute: 1 10*3/uL (ref 0.1–1.0)
Monocytes Relative: 9 %
Neutro Abs: 8.4 10*3/uL — ABNORMAL HIGH (ref 1.7–7.7)
Neutrophils Relative %: 71 %
Platelets: 259 10*3/uL (ref 150–400)
RBC: 3.32 MIL/uL — ABNORMAL LOW (ref 4.22–5.81)
RDW: 18.9 % — ABNORMAL HIGH (ref 11.5–15.5)
WBC: 11.8 10*3/uL — ABNORMAL HIGH (ref 4.0–10.5)
nRBC: 0 % (ref 0.0–0.2)

## 2020-06-12 LAB — COMPREHENSIVE METABOLIC PANEL
ALT: 41 U/L (ref 0–44)
AST: 31 U/L (ref 15–41)
Albumin: 3.4 g/dL — ABNORMAL LOW (ref 3.5–5.0)
Alkaline Phosphatase: 68 U/L (ref 38–126)
Anion gap: 10 (ref 5–15)
BUN: 17 mg/dL (ref 8–23)
CO2: 21 mmol/L — ABNORMAL LOW (ref 22–32)
Calcium: 5.9 mg/dL — CL (ref 8.9–10.3)
Chloride: 111 mmol/L (ref 98–111)
Creatinine, Ser: 1.05 mg/dL (ref 0.61–1.24)
GFR calc Af Amer: >60 mL/min (ref >60)
GFR calc non Af Amer: >60 mL/min (ref >60)
Glucose, Bld: 97 mg/dL (ref 70–99)
Potassium: 4.4 mmol/L (ref 3.5–5.1)
Sodium: 142 mmol/L (ref 135–145)
Total Bilirubin: 0.6 mg/dL (ref 0.3–1.2)
Total Protein: 7.3 g/dL (ref 6.5–8.1)

## 2020-06-12 LAB — TROPONIN I (HIGH SENSITIVITY)
Troponin I (High Sensitivity): 10 ng/L (ref <18)
Troponin I (High Sensitivity): 10 ng/L (ref <18)

## 2020-06-12 MED ORDER — CALCIUM CARBONATE 1250 (500 CA) MG PO TABS
1000.0000 mg | ORAL_TABLET | Freq: Once | ORAL | Status: AC
  Administered 2020-06-12: 1000 mg via ORAL
  Filled 2020-06-12: qty 2

## 2020-06-12 MED ORDER — SODIUM CHLORIDE 0.9 % IV BOLUS
1000.0000 mL | Freq: Once | INTRAVENOUS | Status: AC
  Administered 2020-06-12: 1000 mL via INTRAVENOUS

## 2020-06-12 NOTE — ED Notes (Signed)
CRITICAL VALUE ALERT  Critical Value:  Calcium 5.9  Date & Time Notied:  06/12/2020, 3794  Provider Notified: Dr. Roderic Palau  Orders Received/Actions taken: no new orders received

## 2020-06-12 NOTE — Discharge Instructions (Addendum)
Take over the counter 1000mg  of calcium a day.  Follow up with your md next week

## 2020-06-12 NOTE — Progress Notes (Signed)
Please call this patient.  Tell him the calcium is still low and other tests are relatively okay.  He needs to take calcium supplements over-the-counter.  I would recommend 1000 mg calcium over-the-counter every day.  Follow-up as scheduled.

## 2020-06-12 NOTE — ED Triage Notes (Signed)
Pt reports that he has had dizziness and light headed since April. Reports it is getting worse. And when he stands up he is off balance. Feels like head is spinning. Denies pain. Pt has been seen by PCP

## 2020-06-12 NOTE — ED Provider Notes (Signed)
[facility] Provider Note   CSN: [id number] Arrival date & time: 06/12/20  6010     History Chief Complaint  Patient presents with  . Dizziness    Ronald Mcdowell is a 74 y.o. male.  Patient complains of dizziness.  Patient recently saw his doctor and looking the notes it appears that the patient had a low calcium and anemia  The history is provided by the patient. No language interpreter was used.  Weakness Severity:  Moderate Onset quality:  Sudden Timing:  Constant Progression:  Waxing and waning Chronicity:  New Context: not alcohol use   Relieved by:  Nothing Worsened by:  Nothing Ineffective treatments:  None tried Associated symptoms: no abdominal pain, no chest pain, no cough, no diarrhea, no frequency, no headaches and no seizures   Risk factors: anemia        Past Medical History:  Diagnosis Date  . Anemia   . Coronary artery disease    a. h/o MI in 2000 w/ prior LCX stenting;  b. 7.2014 Abnl Cardiolite, EF 41%, mod-large inferolat scar;  c. 07/2013 Cath/PCI: LM nl, LAD 10-20, D1 40-50p, LCX 95-99 @ distal stent margin (2.5x20 Promus Premier DES), RCA dom, 40p, 48m, 70d, EF 35-40%.  . Elevated cholesterol   . GERD (gastroesophageal reflux disease)   . Hemorrhoids   . Hypertension   . Ischemic cardiomyopathy    a. 07/2013 EF 35-40% by LV gram.  . Myocardial infarction ([place])   . Reflux esophagitis   . Spondylosis of cervical joint    C3-4, C6-7  . Tobacco user   . Vitamin D deficiency disease 08/24/2019    Patient Active Problem List   Diagnosis Date Noted  . Dysphagia 04/19/2020  . Odynophagia 04/19/2020  . Eosinophilic esophagitis 93/23/5573  . Diarrhea 04/19/2020  . A-fib ([place]) 12/01/2019  . Hypokalemia 12/01/2019  . Arm numbness 12/01/2019  . Nicotine dependence 12/01/2019  . Vitamin D deficiency disease 08/24/2019  . Aortic atherosclerosis ([place]) 01/20/2018  . GERD (gastroesophageal reflux disease) 12/17/2017  . HLD  (hyperlipidemia) 11/04/2017  . Loss of weight 11/30/2015  . Abnormal LFTs 11/30/2015  . Liver cyst 11/30/2015  . TIA (transient ischemic attack) 04/10/2015  . HTN (hypertension) 08/24/2013  . Tobacco user   . Coronary artery disease   . Ischemic cardiomyopathy   . DYSPHAGIA ORAL PHASE 04/13/2010  . Old myocardial infarction 01/31/2010  . CORONARY ATHEROSCLEROSIS NATIVE CORONARY ARTERY 01/31/2010    Past Surgical History:  Procedure Laterality Date  . BIOPSY  01/09/2018   Procedure: BIOPSY;  Surgeon: Daneil Dolin, MD;  [facility];  Service: Endoscopy;;  esophageal biopsies  . CARDIAC CATHETERIZATION    . COLONOSCOPY    . COLONOSCOPY N/A 01/25/2016   RMR: normal ileocolonoscopy ( anal canal hemorrhoids)  . CORONARY ANGIOPLASTY WITH STENT PLACEMENT  08/23/2013   mid circumflex  DES    by Dr Martinique  . CORONARY STENT PLACEMENT  2000  . ESOPHAGOGASTRODUODENOSCOPY N/A 01/25/2016   RMR: Reflux esophagitits. Cervical web with passage of the scope. Status post esophageal biosy. Hiatal hernia.   [name] ESOPHAGOGASTRODUODENOSCOPY N/A 01/09/2018   Rourk: cervical/proximal esophageal web s/p dilation, markedly abnormal esophagus c/w biopsy proven eosinophilic esophagitis. small hiatal hernia.  [name] ESOPHAGOGASTRODUODENOSCOPY N/A 05/31/2020   Procedure: ESOPHAGOGASTRODUODENOSCOPY (EGD);  Surgeon: Daneil Dolin, MD;  [facility];  Service: Endoscopy;  Laterality: N/A;  8:30am  . LEFT HEART CATHETERIZATION WITH CORONARY ANGIOGRAM N/A 08/23/2013   Procedure:  LEFT HEART CATHETERIZATION WITH CORONARY ANGIOGRAM;  Surgeon: Peter M Martinique, MD;  Location: [facility] CATH LAB;  Service: Cardiovascular;  Laterality: N/A;  . [name] DILATION N/A 01/09/2018   Procedure: [name] DILATION;  Surgeon: Daneil Dolin, MD;  [facility];  Service: Endoscopy;  Laterality: N/A;  . [name] DILATION N/A 05/31/2020   Procedure: [name] DILATION;  Surgeon: Daneil Dolin, MD;  [facility];   Service: Endoscopy;  Laterality: N/A;  . TRANSURETHRAL RESECTION OF PROSTATE  01/09/2012   Procedure: TRANSURETHRAL RESECTION OF THE PROSTATE (TURP);  Surgeon: Marissa Nestle, MD;  Location: [facility];  Service: Urology;  Laterality: N/A;       Family History  Problem Relation Age of Onset  . Early death Mother        childbirth  . Heart attack Father   . Cancer Father   . Heart disease Brother        CABG  . Heart disease Brother        slight heart attack  . Anesthesia problems Neg Hx   . Hypotension Neg Hx   . Malignant hyperthermia Neg Hx   . Pseudochol deficiency Neg Hx   . Colon cancer Neg Hx   . Liver disease Neg Hx   . Gastric cancer Neg Hx   . Esophageal cancer Neg Hx     Social History   Tobacco Use  . Smoking status: Current Every Day Smoker    Packs/day: 1.00    Years: 53.00    Pack years: 53.00    Types: Cigarettes    Start date: 12/09/1965  . Smokeless tobacco: Never Used  . Tobacco comment: 3/4 pack of cigarettes daily; smoked since 74 years old  Vaping Use  . Vaping Use: Never used  Substance Use Topics  . Alcohol use: No    Alcohol/week: 0.0 standard drinks    Comment: No bowel since year 2000.  Some previous heavy use.  . Drug use: No    Home Medications Prior to Admission medications   Medication Sig Start Date End Date Taking? Authorizing Provider  amLODipine (NORVASC) 5 MG tablet TAKE 1 TABLET BY MOUTH  DAILY Patient taking differently: Take 5 mg by mouth daily.  02/15/20  Yes Gosrani, Nimish C, MD  apixaban (ELIQUIS) 5 MG TABS tablet TAKE 1 TABLET(5 MG) BY MOUTH TWICE DAILY Patient taking differently: Take 5 mg by mouth 2 (two) times daily.  12/15/19  Yes Herminio Commons, MD  atorvastatin (LIPITOR) 40 MG tablet TAKE 1 TABLET BY MOUTH  DAILY Patient taking differently: Take 40 mg by mouth at bedtime.  04/02/20 07/01/20 Yes Gosrani, Nimish C, MD  bisoprolol (ZEBETA) 5 MG tablet TAKE 1 TABLET BY MOUTH  DAILY Patient taking differently: Take 5  mg by mouth daily.  12/29/19  Yes Ailene Ards, NP  Cholecalciferol (VITAMIN D-3) 125 MCG (5000 UT) TABS Take 1 tablet by mouth daily.   Yes [provider]  lisinopril (ZESTRIL) 20 MG tablet TAKE 1 TABLET BY MOUTH  DAILY Patient taking differently: Take 10 mg by mouth daily.  03/14/20  Yes Gosrani, Nimish C, MD  nitroGLYCERIN (NITROSTAT) 0.4 MG SL tablet Place 1 tablet (0.4 mg total) under the tongue every 5 (five) minutes as needed for chest pain. 12/01/19  Yes Ailene Ards, NP  pantoprazole (PROTONIX) 40 MG tablet TAKE 1 TABLET BY MOUTH  DAILY Patient taking differently: Take 40 mg by mouth 2 (two) times daily.  03/24/20  Yes Gosrani, Nimish C, MD  potassium chloride (KLOR-CON) 10 MEQ tablet Take 2 tablets (20 mEq total) by mouth daily. 02/07/20 06/12/20 Yes Herminio Commons, MD  amLODipine (NORVASC) 5 MG tablet Take 1 tablet (5 mg total) by mouth daily. 01/04/20   Herminio Commons, MD  atorvastatin (LIPITOR) 40 MG tablet Take 1 tablet (40 mg total) by mouth at bedtime. Reported on 11/30/2015 12/24/17   Raylene Everts, MD  atorvastatin (LIPITOR) 40 MG tablet TAKE 1 TABLET(40 MG) BY MOUTH DAILY Patient taking differently: Take 40 mg by mouth daily.  03/07/20   Ailene Ards, NP  bisoprolol (ZEBETA) 5 MG tablet TAKE 1 TABLET BY MOUTH DAILY 10/16/19   Hurshel Party C, MD  lisinopril (ZESTRIL) 20 MG tablet Take 20 mg by mouth daily.    [provider]    Allergies    Patient has no known allergies.  Review of Systems   Review of Systems  Constitutional: Negative for appetite change and fatigue.  HENT: Negative for congestion, ear discharge and sinus pressure.   Eyes: Negative for discharge.  Respiratory: Negative for cough.   Cardiovascular: Negative for chest pain.  Gastrointestinal: Negative for abdominal pain and diarrhea.  Genitourinary: Negative for frequency and hematuria.  Musculoskeletal: Negative for back pain.  Skin: Negative for rash.  Neurological:  Positive for weakness. Negative for seizures and headaches.  Psychiatric/Behavioral: Negative for hallucinations.    Physical Exam Updated Vital Signs BP (!) 153/70   Pulse 63   Temp 98.1 F (36.7 C) (Oral)   Resp 18   Ht 5\' 5"  (1.651 m)   Wt 54 kg   SpO2 99%   BMI 19.80 kg/m   Physical Exam Vitals and nursing note reviewed.  Constitutional:      Appearance: He is well-developed.  HENT:     Head: Normocephalic.     Nose: Nose normal.  Eyes:     General: No scleral icterus.    Conjunctiva/sclera: Conjunctivae normal.  Neck:     Thyroid: No thyromegaly.  Cardiovascular:     Rate and Rhythm: Normal rate and regular rhythm.     Heart sounds: No murmur heard.  No friction rub. No gallop.   Pulmonary:     Breath sounds: No stridor. No wheezing or rales.  Chest:     Chest wall: No tenderness.  Abdominal:     General: There is no distension.     Tenderness: There is no abdominal tenderness. There is no rebound.  Musculoskeletal:        General: Normal range of motion.     Cervical back: Neck supple.  Lymphadenopathy:     Cervical: No cervical adenopathy.  Skin:    Findings: No erythema or rash.  Neurological:     Mental Status: He is alert and oriented to person, place, and time.     Motor: No abnormal muscle tone.     Coordination: Coordination normal.  Psychiatric:        Behavior: Behavior normal.     ED Results / Procedures / Treatments   Labs (all labs ordered are listed, but only abnormal results are displayed) Labs Reviewed  CBC WITH DIFFERENTIAL/PLATELET - Abnormal; Notable for the following components:      Result Value   WBC 11.8 (*)    RBC 3.32 (*)    Hemoglobin 9.3 (*)    HCT 29.7 (*)    RDW 18.9 (*)    Neutro Abs 8.4 (*)  All other components within normal limits  COMPREHENSIVE METABOLIC PANEL - Abnormal; Notable for the following components:   CO2 21 (*)    Calcium 5.9 (*)    Albumin 3.4 (*)    All other components within normal limits    TROPONIN I (HIGH SENSITIVITY)  TROPONIN I (HIGH SENSITIVITY)    EKG None  Radiology CT Head Wo Contrast  Result Date: 06/12/2020 CLINICAL DATA:  Pt reports that he has had dizziness and light headed since April. Reports it is getting worse. And when he stands up he is off balance. Feels like head is spinning. Denies pain. EXAM: CT HEAD WITHOUT CONTRAST TECHNIQUE: Contiguous axial images were obtained from the base of the skull through the vertex without intravenous contrast. COMPARISON:  CT head 04/11/2020 FINDINGS: Brain: No evidence of acute infarction, hemorrhage, hydrocephalus, extra-axial collection or mass lesion/mass effect. Vascular: No hyperdense vessel or unexpected calcification. Skull: Normal. Negative for fracture or focal lesion. Sinuses/Orbits: No acute finding. Other: None. IMPRESSION: No acute intracranial pathology. Electronically Signed   By: Audie Pinto M.D.   On: 06/12/2020 08:46   DG Chest Port 1 View  Result Date: 06/12/2020 CLINICAL DATA:  Weakness/htn/hx MI/smoker/hx heart cath with stent placement Covid neg 05/2020 EXAM: PORTABLE CHEST 1 VIEW COMPARISON:  Chest radiograph 03/27/2020 FINDINGS: Stable cardiomediastinal contours. Aortic arch calcification. The lungs are clear. No pneumothorax or large pleural effusion. No acute finding in the visualized skeleton. IMPRESSION: No acute cardiopulmonary finding. Electronically Signed   By: Audie Pinto M.D.   On: 06/12/2020 08:26    Procedures Procedures (including critical care time)  Medications Ordered in ED Medications  calcium carbonate (OS-CAL - dosed in mg of elemental calcium) tablet 1,000 mg of elemental calcium (has no administration in time range)  sodium chloride 0.9 % bolus 1,000 mL (1,000 mLs Intravenous New Bag/Given 06/12/20 6270)    ED Course  I have reviewed the triage vital signs and the nursing notes.  Pertinent labs & imaging results that were available during my care of the patient were  reviewed by me and considered in my medical decision making (see chart for details).    MDM Rules/Calculators/A&P                           Patient with dizziness secondary to hypocalcemia and anemia.  His physician recommended at 1000 mg of calcium a day.  I will stressed this with the patient have follow-up with PCP....              Marland KitchenThis patient presents to the ED for concern of dizziness, this involves an extensive number of treatment options, and is a complaint that carries with it a high risk of complications and morbidity.  The differential diagnosis includes MI dehydration   Lab Tests:  I Ordered, reviewed, and interpreted labs, which included CBC chemistries.  He has anemia leukocytosis and hypocalcemia Medicines ordered:   I ordered medication calcium for hypocalcemia  Imaging Studies ordered:   I ordered imaging studies which included chest x-ray and CT head and  I independently visualized and interpreted imaging which showed unremarkable  Additional history obtained:   Additional history obtained from spouse  Previous records obtained and reviewed.  Consultations Obtained:     Reevaluation:  After the interventions stated above, I reevaluated the patient and found mild improvement  Critical Interventions:  .   Final Clinical Impression(s) / ED Diagnoses Final diagnoses:  Dizziness  Hypocalcemia  Rx / DC Orders ED Discharge Orders    None       Milton Ferguson, MD 06/12/20 1051

## 2020-06-12 NOTE — Progress Notes (Signed)
Patient called.  Left detail message on the voicemail home phone.

## 2020-06-12 NOTE — ED Notes (Signed)
Pt ambulated to the bathroom at this time with NAD noted. PT returned back to his room and to the stretcher. PT's wife present at bedside.

## 2020-06-15 ENCOUNTER — Ambulatory Visit (INDEPENDENT_AMBULATORY_CARE_PROVIDER_SITE_OTHER): Payer: Medicare Other | Admitting: Nurse Practitioner

## 2020-06-23 ENCOUNTER — Other Ambulatory Visit (INDEPENDENT_AMBULATORY_CARE_PROVIDER_SITE_OTHER): Payer: Self-pay | Admitting: Nurse Practitioner

## 2020-06-23 DIAGNOSIS — E785 Hyperlipidemia, unspecified: Secondary | ICD-10-CM

## 2020-06-23 DIAGNOSIS — K2100 Gastro-esophageal reflux disease with esophagitis, without bleeding: Secondary | ICD-10-CM

## 2020-06-23 DIAGNOSIS — I10 Essential (primary) hypertension: Secondary | ICD-10-CM

## 2020-07-04 DIAGNOSIS — E0590 Thyrotoxicosis, unspecified without thyrotoxic crisis or storm: Secondary | ICD-10-CM | POA: Diagnosis not present

## 2020-07-05 ENCOUNTER — Ambulatory Visit: Payer: Medicare Other | Admitting: "Endocrinology

## 2020-07-05 LAB — T4, FREE: Free T4: 2.4 ng/dL — ABNORMAL HIGH (ref 0.8–1.8)

## 2020-07-05 LAB — TSH: TSH: <0.01 mIU/L — ABNORMAL LOW (ref 0.40–4.50)

## 2020-07-11 ENCOUNTER — Other Ambulatory Visit: Payer: Self-pay

## 2020-07-11 ENCOUNTER — Ambulatory Visit (INDEPENDENT_AMBULATORY_CARE_PROVIDER_SITE_OTHER): Payer: Medicare Other | Admitting: "Endocrinology

## 2020-07-11 ENCOUNTER — Encounter: Payer: Self-pay | Admitting: "Endocrinology

## 2020-07-11 VITALS — BP 142/74 | HR 80 | Ht 65.0 in | Wt 130.8 lb

## 2020-07-11 DIAGNOSIS — E0590 Thyrotoxicosis, unspecified without thyrotoxic crisis or storm: Secondary | ICD-10-CM

## 2020-07-11 NOTE — Progress Notes (Signed)
07/11/2020    Endocrinology follow-up note   Subjective:    Patient ID: Ronald Mcdowell, male    DOB: 20-May-1946, PCP Doree Albee, MD.   Past Medical History:  Diagnosis Date  . Anemia   . Coronary artery disease    a. h/o MI in 2000 w/ prior LCX stenting;  b. 7.2014 Abnl Cardiolite, EF 41%, mod-large inferolat scar;  c. 07/2013 Cath/PCI: LM nl, LAD 10-20, D1 40-50p, LCX 95-99 @ distal stent margin (2.5x20 Promus Premier DES), RCA dom, 40p, 100m, 70d, EF 35-40%.  . Elevated cholesterol   . GERD (gastroesophageal reflux disease)   . Hemorrhoids   . Hypertension   . Ischemic cardiomyopathy    a. 07/2013 EF 35-40% by LV gram.  . Myocardial infarction ([place])   . Reflux esophagitis   . Spondylosis of cervical joint    C3-4, C6-7  . Tobacco user   . Vitamin D deficiency disease 08/24/2019    Past Surgical History:  Procedure Laterality Date  . BIOPSY  01/09/2018   Procedure: BIOPSY;  Surgeon: Daneil Dolin, MD;  [facility];  Service: Endoscopy;;  esophageal biopsies  . CARDIAC CATHETERIZATION    . COLONOSCOPY    . COLONOSCOPY N/A 01/25/2016   RMR: normal ileocolonoscopy ( anal canal hemorrhoids)  . CORONARY ANGIOPLASTY WITH STENT PLACEMENT  08/23/2013   mid circumflex  DES    by Dr Martinique  . CORONARY STENT PLACEMENT  2000  . ESOPHAGOGASTRODUODENOSCOPY N/A 01/25/2016   RMR: Reflux esophagitits. Cervical web with passage of the scope. Status post esophageal biosy. Hiatal hernia.   [name] ESOPHAGOGASTRODUODENOSCOPY N/A 01/09/2018   Rourk: cervical/proximal esophageal web s/p dilation, markedly abnormal esophagus c/w biopsy proven eosinophilic esophagitis. small hiatal hernia.  [name] ESOPHAGOGASTRODUODENOSCOPY N/A 05/31/2020   Procedure: ESOPHAGOGASTRODUODENOSCOPY (EGD);  Surgeon: Daneil Dolin, MD;  [facility];  Service: Endoscopy;  Laterality: N/A;  8:30am  . LEFT HEART CATHETERIZATION WITH CORONARY ANGIOGRAM N/A 08/23/2013   Procedure: LEFT HEART  CATHETERIZATION WITH CORONARY ANGIOGRAM;  Surgeon: Peter M Martinique, MD;  Location: [facility] CATH LAB;  Service: Cardiovascular;  Laterality: N/A;  . [name] DILATION N/A 01/09/2018   Procedure: [name] DILATION;  Surgeon: Daneil Dolin, MD;  [facility];  Service: Endoscopy;  Laterality: N/A;  . [name] DILATION N/A 05/31/2020   Procedure: [name] DILATION;  Surgeon: Daneil Dolin, MD;  [facility];  Service: Endoscopy;  Laterality: N/A;  . TRANSURETHRAL RESECTION OF PROSTATE  01/09/2012   Procedure: TRANSURETHRAL RESECTION OF THE PROSTATE (TURP);  Surgeon: Marissa Nestle, MD;  Location: [facility];  Service: Urology;  Laterality: N/A;    Social History   Socioeconomic History  . Marital status: Married    Spouse name: [name]  . Number of children: Not on file  . Years of education: 11th grade  . Highest education level: 11th grade  Occupational History  . Occupation: Librarian, academic  Tobacco Use  . Smoking status: Current Every Day Smoker    Packs/day: 1.00    Years: 53.00    Pack years: 53.00    Types: Cigarettes    Start date: 12/09/1965  . Smokeless tobacco: Never Used  . Tobacco comment: 3/4 pack of cigarettes daily; smoked since 74 years old  Vaping Use  . Vaping Use: Never used  Substance and Sexual Activity  . Alcohol use: No    Alcohol/week: 0.0 standard drinks    Comment: No bowel since year 2000.  Some previous heavy use.  . Drug use: No  . Sexual activity: Yes    Birth control/protection: None  Other Topics Concern  . 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   Social Determinants of Health   Financial Resource Strain:   . Difficulty of Paying Living Expenses:   Food Insecurity:   . Worried About Charity fundraiser in the Last Year:   . Arboriculturist in the Last Year:   Transportation Needs:   . Film/video editor (Medical):   [name] Lack of  Transportation (Non-Medical):   Physical Activity:   . Days of Exercise per Week:   . Minutes of Exercise per Session:   Stress:   . Feeling of Stress :   Social Connections:   . Frequency of Communication with Friends and Family:   . Frequency of Social Gatherings with Friends and Family:   . Attends Religious Services:   . Active Member of Clubs or Organizations:   . Attends Archivist Meetings:   [name] Marital Status:     Family History  Problem Relation Age of Onset  . Early death Mother        childbirth  . Heart attack Father   . Cancer Father   . Heart disease Brother        CABG  . Heart disease Brother        slight heart attack  . Anesthesia problems Neg Hx   . Hypotension Neg Hx   . Malignant hyperthermia Neg Hx   . Pseudochol deficiency Neg Hx   . Colon cancer Neg Hx   . Liver disease Neg Hx   . Gastric cancer Neg Hx   . Esophageal cancer Neg Hx     Outpatient Encounter Medications as of 07/11/2020  Medication Sig  . amLODipine (NORVASC) 5 MG tablet TAKE 1 TABLET BY MOUTH  DAILY (Patient taking differently: Take 5 mg by mouth daily. )  . apixaban (ELIQUIS) 5 MG TABS tablet TAKE 1 TABLET(5 MG) BY MOUTH TWICE DAILY (Patient taking differently: Take 5 mg by mouth 2 (two) times daily. )  . atorvastatin (LIPITOR) 40 MG tablet TAKE 1 TABLET BY MOUTH  DAILY (Patient taking differently: Take 40 mg by mouth at bedtime. )  . bisoprolol (ZEBETA) 5 MG tablet Take 1 tablet (5 mg total) by mouth daily.  . Cholecalciferol (VITAMIN D-3) 125 MCG (5000 UT) TABS Take 1 tablet by mouth daily.  [name] lisinopril (ZESTRIL) 20 MG tablet TAKE 1 TABLET BY MOUTH  DAILY (Patient taking differently: Take 10 mg by mouth daily. )  . nitroGLYCERIN (NITROSTAT) 0.4 MG SL tablet Place 1 tablet (0.4 mg total) under the tongue every 5 (five) minutes as needed for chest pain.  . pantoprazole (PROTONIX) 40 MG tablet TAKE 1 TABLET BY MOUTH  DAILY (Patient taking differently: Take 40 mg by mouth 2  (two) times daily. )  . potassium chloride (KLOR-CON) 10 MEQ tablet Take 2 tablets (20 mEq total) by mouth daily.  . [DISCONTINUED] amLODipine (NORVASC) 5 MG tablet Take 1 tablet (5 mg total) by mouth daily.  . [DISCONTINUED] atorvastatin (LIPITOR) 40 MG tablet Take 1 tablet (40 mg total) by mouth at bedtime. Reported on 11/30/2015  . [DISCONTINUED] atorvastatin (LIPITOR) 40 MG tablet TAKE 1 TABLET(40 MG) BY MOUTH DAILY (Patient taking differently: Take 40 mg by mouth daily. )  . [DISCONTINUED] bisoprolol (ZEBETA)  5 MG tablet TAKE 1 TABLET BY MOUTH DAILY  . [DISCONTINUED] lisinopril (ZESTRIL) 20 MG tablet Take 20 mg by mouth daily.   No facility-administered encounter medications on file as of 07/11/2020.    ALLERGIES: No Known Allergies  VACCINATION STATUS: Immunization History  Administered Date(s) Administered  . Influenza,inj,Quad PF,6+ Mos 08/24/2013, 11/04/2017  . Influenza-Unspecified 09/26/2019  . Moderna SARS-COVID-2 Vaccination 02/15/2020  . Pneumococcal Conjugate-13 04/22/2019  . Td 02/23/2017     HPI  Ronald Mcdowell is 74 y.o. male who presents today with a medical history as above. he is being seen in follow-up after he was seen in consultation for hyperthyroidism requested by Doree Albee, MD.  he was treated with I-131 thyroid ablation for Graves' disease on May 09, 2020.  He reports significant improvement in his prior symptoms.  He has gained 25 pounds since last visit, half of what he lost when he was symptomatic of thyrotoxicosis.    He sleeps better, has consistent energy level.    he denies dysphagia, choking, shortness of breath, no recent voice change.    he denies family history of thyroid dysfunction, denies family hx of thyroid cancer.  He denies any exposure to amiodarone. he denies personal history of goiter.                           Review of systems  Constitutional: + weight gain, + fatigue, + subjective hyperthermia Eyes: no blurry vision, -  xerophthalmia ENT: no sore throat, no nodules palpated in throat, no dysphagia/odynophagia, nor hoarseness Cardiovascular: no Chest Pain, no Shortness of Breath, - palpitations, no leg swelling Respiratory: no cough, no SOB Gastrointestinal: no Nausea, no Vomiting, no Diarhhea Musculoskeletal: no muscle/joint aches Skin: no rashes Neurological: -  tremors, no numbness, no tingling, no dizziness Psychiatric: no depression, -  anxiety   Objective:    BP (!) 142/74   Pulse 80   Ht 5\' 5"  (1.651 m)   Wt 130 lb 12.8 oz (59.3 kg)   BMI 21.77 kg/m   Wt Readings from Last 3 Encounters:  07/11/20 130 lb 12.8 oz (59.3 kg)  06/12/20 119 lb (54 kg)  06/07/20 119 lb 9.6 oz (54.3 kg)                                                Physical exam  Constitutional: Body mass index is 21.7, improving from 17.8, not in acute distress, + anxious state of mind Eyes: PERRLA, EOMI, - exophthalmos ENT: moist mucous membranes, -  thyromegaly, no cervical lymphadenopathy Cardiovascular: + Stable precordium, -tachycardic (patient is on a beta-blocker),  no Murmur/Rubs/Gallops Respiratory:  adequate breathing efforts, no gross chest deformity, Clear to auscultation bilaterally Gastrointestinal: abdomen soft, Non -tender, No distension, Bowel Sounds present Musculoskeletal: no gross deformities, strength intact in all four extremities Skin: moist, warm, no rashes Neurological:  -  tremor with outstretched hands,  + Deep Tendon Reflexes  on both lower extremities.   CMP     Component Value Date/Time   NA 142 06/12/2020 0811   K 4.4 06/12/2020 0811   CL 111 06/12/2020 0811   CO2 21 (L) 06/12/2020 0811   GLUCOSE 97 06/12/2020 0811   BUN 17 06/12/2020 0811   CREATININE 1.05 06/12/2020 0811   CREATININE 1.14 06/07/2020 1500   CALCIUM  5.9 (LL) 06/12/2020 0811   PROT 7.3 06/12/2020 0811   ALBUMIN 3.4 (L) 06/12/2020 0811   AST 31 06/12/2020 0811   ALT 41 06/12/2020 0811   ALKPHOS 68 06/12/2020 0811    BILITOT 0.6 06/12/2020 0811   GFRNONAA >60 06/12/2020 0811   GFRNONAA 64 06/07/2020 1500   GFRAA >60 06/12/2020 0811   GFRAA 74 06/07/2020 1500     CBC    Component Value Date/Time   WBC 11.8 (H) 06/12/2020 0811   RBC 3.32 (L) 06/12/2020 0811   HGB 9.3 (L) 06/12/2020 0811   HCT 29.7 (L) 06/12/2020 0811   PLT 259 06/12/2020 0811   MCV 89.5 06/12/2020 0811   MCH 28.0 06/12/2020 0811   MCHC 31.3 06/12/2020 0811   RDW 18.9 (H) 06/12/2020 0811   LYMPHSABS 2.1 06/12/2020 0811   MONOABS 1.0 06/12/2020 0811   EOSABS 0.2 06/12/2020 0811   BASOSABS 0.0 06/12/2020 0811     Diabetic Labs (most recent): Lab Results  Component Value Date   HGBA1C 5.9 (H) 04/10/2015   HGBA1C 5.5 06/06/2013    Lipid Panel     Component Value Date/Time   CHOL 131 11/04/2017 1417   TRIG 80 11/04/2017 1417   HDL 36 (L) 11/04/2017 1417   CHOLHDL 3.6 11/04/2017 1417   VLDL 15 03/12/2016 0704   LDLCALC 79 11/04/2017 1417     Lab Results  Component Value Date   TSH <0.01 (L) 07/04/2020   TSH 0.01 (L) 03/14/2020   TSH 0.674 04/10/2015   TSH 0.455 06/06/2013   FREET4 2.4 (H) 07/04/2020    Thyroid uptake and scan on April 27, 2020 FINDINGS: Homogeneous tracer distribution in both thyroid lobes.  No focal areas of increased or decreased tracer localization.  4 hour I-123 uptake = 41% (normal 5-20%)  ,  24 hour I-123 uptake = 72% (normal 10-30%)  IMPRESSION: Normal thyroid scan.  Elevated 4 hour and 24 hour radio iodine uptakes consistent with hyperthyroidism.  Overall findings consistent with Graves disease.   May 09, 2020 nuclear medicine IMPRESSION: Per oral administration of I-131 sodium iodide for the treatment of hyperthyroidism.   Assessment & Plan:   1. Hyperthyroidism  2.  Graves' disease  -He is status post I-131 therapy for hyperthyroidism from Graves' disease on May 09, 2020.  His clinical presentation and his previsit labs are consistent with treatment effect .   He is not hypothyroid yet.  He is not ready to initiate thyroid hormone replacement.   -Patient is made aware of the high likelihood of post ablative hypothyroidism with subsequent need for lifelong thyroid hormone replacement.  His pulse rate is controlled at 70-80, on beta-blocker. -He will return in 6 weeks with repeat thyroid function tests. -I did not initiate any prescription for him today.  -Patient is advised to maintain close follow up with Doree Albee, MD for primary care needs.      - Time spent on this patient care encounter:  20 minutes of which 50% was spent in  counseling and the rest reviewing  his current and  previous labs / studies and medications  doses and developing a plan for long term care. Ronald Mcdowell  participated in the discussions, expressed understanding, and voiced agreement with the above plans.  All questions were answered to his satisfaction. he is encouraged to contact clinic should he have any questions or concerns prior to his return visit.   Follow up plan: Return in about 6 weeks (around  08/22/2020) for F/U with Pre-visit Labs.   Thank you for involving me in the care of this pleasant patient, and I will continue to update you with his progress.  Glade Lloyd, MD [facility] [place] [facility] Phone: [phone]  Fax: [phone]   07/11/2020, 3:39 PM  This note was partially dictated with voice recognition software. Similar sounding words can be transcribed inadequately or may not  be corrected upon review.

## 2020-07-27 DIAGNOSIS — B370 Candidal stomatitis: Secondary | ICD-10-CM | POA: Diagnosis not present

## 2020-07-27 DIAGNOSIS — L438 Other lichen planus: Secondary | ICD-10-CM | POA: Diagnosis not present

## 2020-08-04 ENCOUNTER — Ambulatory Visit: Payer: Medicare Other | Admitting: Gastroenterology

## 2020-08-07 ENCOUNTER — Other Ambulatory Visit: Payer: Self-pay | Admitting: "Endocrinology

## 2020-08-07 DIAGNOSIS — E0590 Thyrotoxicosis, unspecified without thyrotoxic crisis or storm: Secondary | ICD-10-CM | POA: Diagnosis not present

## 2020-08-08 ENCOUNTER — Ambulatory Visit (INDEPENDENT_AMBULATORY_CARE_PROVIDER_SITE_OTHER): Payer: Medicare Other | Admitting: Internal Medicine

## 2020-08-08 ENCOUNTER — Encounter (INDEPENDENT_AMBULATORY_CARE_PROVIDER_SITE_OTHER): Payer: Self-pay | Admitting: Internal Medicine

## 2020-08-08 ENCOUNTER — Other Ambulatory Visit: Payer: Self-pay

## 2020-08-08 DIAGNOSIS — E559 Vitamin D deficiency, unspecified: Secondary | ICD-10-CM

## 2020-08-08 LAB — TSH: TSH: <0.01 mIU/L — ABNORMAL LOW (ref 0.40–4.50)

## 2020-08-08 LAB — T4, FREE: Free T4: 1.4 ng/dL (ref 0.8–1.8)

## 2020-08-08 NOTE — Progress Notes (Signed)
Metrics: Intervention Frequency ACO  Documented Smoking Status Yearly  Screened one or more times in 24 months  Cessation Counseling or  Active cessation medication Past 24 months  Past 24 months   Guideline developer: UpToDate (See UpToDate for funding source) Date Released: 2014       Wellness Office Visit  Subjective:  Patient ID: Ronald Mcdowell, male    DOB: 05-12-1946  Age: 74 y.o. MRN: [id number]  CC: This man comes in for follow-up of hypocalcemia and vitamin D deficiency. HPI  He is following with endocrinology regarding his Ronald Mcdowell' disease and post radioactive iodine treatment. Since he has been taking calcium every day, he feels much improved and has no further episodes of dizziness. He is also taking vitamin D3 5000 units daily. Past Medical History:  Diagnosis Date  . Anemia   . Coronary artery disease    a. h/o MI in 2000 w/ prior LCX stenting;  b. 7.2014 Abnl Cardiolite, EF 41%, mod-large inferolat scar;  c. 07/2013 Cath/PCI: LM nl, LAD 10-20, D1 40-50p, LCX 95-99 @ distal stent margin (2.5x20 Promus Premier DES), RCA dom, 40p, 27m, 70d, EF 35-40%.  . Elevated cholesterol   . GERD (gastroesophageal reflux disease)   . Hemorrhoids   . Hypertension   . Ischemic cardiomyopathy    a. 07/2013 EF 35-40% by LV gram.  . Myocardial infarction ([place])   . Reflux esophagitis   . Spondylosis of cervical joint    C3-4, C6-7  . Tobacco user   . Vitamin D deficiency disease 08/24/2019   Past Surgical History:  Procedure Laterality Date  . BIOPSY  01/09/2018   Procedure: BIOPSY;  Surgeon: Daneil Dolin, MD;  [facility];  Service: Endoscopy;;  esophageal biopsies  . CARDIAC CATHETERIZATION    . COLONOSCOPY    . COLONOSCOPY N/A 01/25/2016   RMR: normal ileocolonoscopy ( anal canal hemorrhoids)  . CORONARY ANGIOPLASTY WITH STENT PLACEMENT  08/23/2013   mid circumflex  DES    by Dr Martinique  . CORONARY STENT PLACEMENT  2000  . ESOPHAGOGASTRODUODENOSCOPY N/A 01/25/2016    RMR: Reflux esophagitits. Cervical web with passage of the scope. Status post esophageal biosy. Hiatal hernia.   [name] ESOPHAGOGASTRODUODENOSCOPY N/A 01/09/2018   Rourk: cervical/proximal esophageal web s/p dilation, markedly abnormal esophagus c/w biopsy proven eosinophilic esophagitis. small hiatal hernia.  [name] ESOPHAGOGASTRODUODENOSCOPY N/A 05/31/2020   Procedure: ESOPHAGOGASTRODUODENOSCOPY (EGD);  Surgeon: Daneil Dolin, MD;  [facility];  Service: Endoscopy;  Laterality: N/A;  8:30am  . LEFT HEART CATHETERIZATION WITH CORONARY ANGIOGRAM N/A 08/23/2013   Procedure: LEFT HEART CATHETERIZATION WITH CORONARY ANGIOGRAM;  Surgeon: Peter M Martinique, MD;  Location: [facility] CATH LAB;  Service: Cardiovascular;  Laterality: N/A;  . [name] DILATION N/A 01/09/2018   Procedure: [name] DILATION;  Surgeon: Daneil Dolin, MD;  [facility];  Service: Endoscopy;  Laterality: N/A;  . [name] DILATION N/A 05/31/2020   Procedure: [name] DILATION;  Surgeon: Daneil Dolin, MD;  [facility];  Service: Endoscopy;  Laterality: N/A;  . TRANSURETHRAL RESECTION OF PROSTATE  01/09/2012   Procedure: TRANSURETHRAL RESECTION OF THE PROSTATE (TURP);  Surgeon: Marissa Nestle, MD;  Location: [facility];  Service: Urology;  Laterality: N/A;     Family History  Problem Relation Age of Onset  . Early death Mother        childbirth  . Heart attack Father   . Cancer Father   . Heart disease Brother  CABG  . Heart disease Brother        slight heart attack  . Anesthesia problems Neg Hx   . Hypotension Neg Hx   . Malignant hyperthermia Neg Hx   . Pseudochol deficiency Neg Hx   . Colon cancer Neg Hx   . Liver disease Neg Hx   . Gastric cancer Neg Hx   . 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   Social History   Tobacco Use  . Smoking status: Current  Every Day Smoker    Packs/day: 1.00    Years: 53.00    Pack years: 53.00    Types: Cigarettes    Start date: 12/09/1965  . Smokeless tobacco: Never Used  . Tobacco comment: 3/4 pack of cigarettes daily; smoked since 74 years old  Substance Use Topics  . Alcohol use: No    Alcohol/week: 0.0 standard drinks    Comment: No bowel since year 2000.  Some previous heavy use.    Current Meds  Medication Sig  . amLODipine (NORVASC) 5 MG tablet TAKE 1 TABLET BY MOUTH  DAILY (Patient taking differently: Take 5 mg by mouth daily. )  . apixaban (ELIQUIS) 5 MG TABS tablet TAKE 1 TABLET(5 MG) BY MOUTH TWICE DAILY (Patient taking differently: Take 5 mg by mouth 2 (two) times daily. )  . bisoprolol (ZEBETA) 5 MG tablet Take 1 tablet (5 mg total) by mouth daily.  . Cholecalciferol (VITAMIN D-3) 125 MCG (5000 UT) TABS Take 1 tablet by mouth daily.  [name] lisinopril (ZESTRIL) 20 MG tablet TAKE 1 TABLET BY MOUTH  DAILY (Patient taking differently: Take 10 mg by mouth daily. )  . nitroGLYCERIN (NITROSTAT) 0.4 MG SL tablet Place 1 tablet (0.4 mg total) under the tongue every 5 (five) minutes as needed for chest pain.  . pantoprazole (PROTONIX) 40 MG tablet TAKE 1 TABLET BY MOUTH  DAILY (Patient taking differently: Take 40 mg by mouth 2 (two) times daily. )      Depression screen [facility] 2/9 03/09/2020 11/04/2017  Decreased Interest 0 0  Down, Depressed, Hopeless 0 0  PHQ - 2 Score 0 0     Objective:   Today's Vitals: BP 132/77 (BP Location: Right Arm, Patient Position: Sitting, Cuff Size: Normal)   Pulse 67   Temp (!) 97 F (36.1 C) (Temporal)   Resp 18   Ht 5\' 5"  (1.651 m)   Wt 135 lb (61.2 kg)   SpO2 99%   BMI 22.47 kg/m  Vitals with BMI 08/08/2020 07/11/2020 06/12/2020  Height 5\' 5"  5\' 5"  -  Weight 135 lbs 130 lbs 13 oz -  BMI 41.74 08.14 -  Systolic 481 856 -  Diastolic 77 74 -  Pulse 67 80 69     Physical Exam  He looks systemically well.  He has gained 5 pounds in weight since the last  time seen in the system.     Assessment   1. Hypocalcemia   2. Vitamin D deficiency disease       Tests ordered Orders Placed This Encounter  Procedures  . COMPLETE METABOLIC PANEL WITH GFR  . VITAMIN D 25 Hydroxy (Vit-D Deficiency, Fractures)     Plan: 1. He will continue with vitamin D3 and calcium supplementation.  We will check levels today. 2. I will see him next January for an annual physical exam.  No orders of the defined types were placed in this encounter.   Doree Albee, MD

## 2020-08-08 NOTE — Progress Notes (Signed)
Pt did go to the ER for dizziness. And was  given Calcium to take daily to help improve the dizzy spells.

## 2020-08-11 ENCOUNTER — Encounter: Payer: Self-pay | Admitting: Family Medicine

## 2020-08-11 ENCOUNTER — Ambulatory Visit (INDEPENDENT_AMBULATORY_CARE_PROVIDER_SITE_OTHER): Payer: Medicare Other | Admitting: Family Medicine

## 2020-08-11 VITALS — BP 142/70 | HR 66 | Ht 65.0 in | Wt 137.0 lb

## 2020-08-11 DIAGNOSIS — Z720 Tobacco use: Secondary | ICD-10-CM

## 2020-08-11 DIAGNOSIS — I10 Essential (primary) hypertension: Secondary | ICD-10-CM | POA: Diagnosis not present

## 2020-08-11 DIAGNOSIS — I4891 Unspecified atrial fibrillation: Secondary | ICD-10-CM

## 2020-08-11 DIAGNOSIS — I2510 Atherosclerotic heart disease of native coronary artery without angina pectoris: Secondary | ICD-10-CM

## 2020-08-11 DIAGNOSIS — E782 Mixed hyperlipidemia: Secondary | ICD-10-CM | POA: Diagnosis not present

## 2020-08-11 DIAGNOSIS — I255 Ischemic cardiomyopathy: Secondary | ICD-10-CM

## 2020-08-11 NOTE — Progress Notes (Signed)
Cardiology Office Note  Date: 08/11/2020   ID: Ronald Mcdowell, DOB [birth year], MRN [id number]  PCP:  Doree Albee, MD  Cardiologist:  No primary care provider on file. Electrophysiologist:  None   Chief Complaint: Follow-up new onset atrial fibrillation  History of Present Illness: Ronald Mcdowell is a 74 y.o. male with a history of atrial fibrillation, hypertension, ischemic cardiomyopathy, hyperlipidemia, CAD, tobacco abuse.  Last saw Dr. Bronson Ing 12/10/2019 for routine cardiovascular follow-up.  History of CAD with stenting of left circumflex September 2014.  Also significant RCA disease with 40% stenosis in proximal vessel followed by a long 80% stenosis in proximal to mid vessel.  Distal vessel was diffusely diseased 70%.  It was noted at that time if there he were to have recalcitrant chest pain refractory to medical therapy RCA would require extensive stenting.  He continues to smoke 1 pack/day of cigarettes.  He denies any chest pain, shortness of breath, leg swelling, orthopnea, palpitations.   Recent emergency room visit on 06/12/2020 with complaints of dizziness.  ER notes state patient recently saw his primary care doctor and looking at the note it appeared patient had low calcium and anemia.  Final diagnosis was dizziness and hypocalcemia with calcium of 5.9. Physician recommended 1000 mg of calcium per day.  ER provider stressed this point with the patient and to have follow-up with PCP.  ED lab work showed hemoglobin 9.3 and hematocrit 29.7, calcium 5.9, troponin 10-10.  Recent lab work from Dr. Dorris Fetch endocrinology showed TSH of less than 0.01, T4 free 2.4.  Subsequent TSH and T4 free on 08/07/2020 TSH less than 0.01, T4 free 1.4.   He presents today status post ED follow-up.  He denies any weakness, fatigue, dizziness, lightheadedness, presyncopal or syncopal episodes.  Blood pressure systolic is slightly elevated today 142/70.  He states normally his blood pressure is not as  high.  Usually in the 517O systolic.  He does smoke and this may have influenced his blood pressure today.  He denies any palpitations or arrhythmias or rapid heart rates.  Denies any CVA or TIA-like symptoms, bleeding issues, PND, orthopnea.  He has had some recent issues with his esophagus with recent [facility] dilation.  Recent EGD impression on 05/31/2020: Abnormal esophagus consistent with EOE. Status post dilation and biopsy.  States he is feeling much better since he had the esophageal dilation.  Denies any anginal or exertional symptoms.  Past Medical History:  Diagnosis Date  . Anemia   . Coronary artery disease    a. h/o MI in 2000 w/ prior LCX stenting;  b. 7.2014 Abnl Cardiolite, EF 41%, mod-large inferolat scar;  c. 07/2013 Cath/PCI: LM nl, LAD 10-20, D1 40-50p, LCX 95-99 @ distal stent margin (2.5x20 Promus Premier DES), RCA dom, 40p, 33m, 70d, EF 35-40%.  . Elevated cholesterol   . GERD (gastroesophageal reflux disease)   . Hemorrhoids   . Hypertension   . Ischemic cardiomyopathy    a. 07/2013 EF 35-40% by LV gram.  . Myocardial infarction ([place])   . Reflux esophagitis   . Spondylosis of cervical joint    C3-4, C6-7  . Tobacco user   . Vitamin D deficiency disease 08/24/2019    Past Surgical History:  Procedure Laterality Date  . BIOPSY  01/09/2018   Procedure: BIOPSY;  Surgeon: Daneil Dolin, MD;  Location: [facility];  Service: Endoscopy;;  esophageal biopsies  . CARDIAC CATHETERIZATION    . COLONOSCOPY    .  COLONOSCOPY N/A 01/25/2016   RMR: normal ileocolonoscopy ( anal canal hemorrhoids)  . CORONARY ANGIOPLASTY WITH STENT PLACEMENT  08/23/2013   mid circumflex  DES    by Dr Martinique  . CORONARY STENT PLACEMENT  2000  . ESOPHAGOGASTRODUODENOSCOPY N/A 01/25/2016   RMR: Reflux esophagitits. Cervical web with passage of the scope. Status post esophageal biosy. Hiatal hernia.   [name] ESOPHAGOGASTRODUODENOSCOPY N/A 01/09/2018   Rourk: cervical/proximal esophageal web s/p dilation,  markedly abnormal esophagus c/w biopsy proven eosinophilic esophagitis. small hiatal hernia.  [name] ESOPHAGOGASTRODUODENOSCOPY N/A 05/31/2020   Procedure: ESOPHAGOGASTRODUODENOSCOPY (EGD);  Surgeon: Daneil Dolin, MD;  Location: [facility];  Service: Endoscopy;  Laterality: N/A;  8:30am  . LEFT HEART CATHETERIZATION WITH CORONARY ANGIOGRAM N/A 08/23/2013   Procedure: LEFT HEART CATHETERIZATION WITH CORONARY ANGIOGRAM;  Surgeon: Peter M Martinique, MD;  Location: [facility] CATH LAB;  Service: Cardiovascular;  Laterality: N/A;  . [name] DILATION N/A 01/09/2018   Procedure: [name] DILATION;  Surgeon: Daneil Dolin, MD;  Location: [facility];  Service: Endoscopy;  Laterality: N/A;  . [name] DILATION N/A 05/31/2020   Procedure: [name] DILATION;  Surgeon: Daneil Dolin, MD;  Location: [facility];  Service: Endoscopy;  Laterality: N/A;  . TRANSURETHRAL RESECTION OF PROSTATE  01/09/2012   Procedure: TRANSURETHRAL RESECTION OF THE PROSTATE (TURP);  Surgeon: Marissa Nestle, MD;  Location: [facility];  Service: Urology;  Laterality: N/A;    Current Outpatient Medications  Medication Sig Dispense Refill  . amLODipine (NORVASC) 5 MG tablet TAKE 1 TABLET BY MOUTH  DAILY (Patient taking differently: Take 5 mg by mouth daily. ) 90 tablet 3  . apixaban (ELIQUIS) 5 MG TABS tablet TAKE 1 TABLET(5 MG) BY MOUTH TWICE DAILY (Patient taking differently: Take 5 mg by mouth 2 (two) times daily. ) 180 tablet 2  . atorvastatin (LIPITOR) 40 MG tablet TAKE 1 TABLET BY MOUTH  DAILY (Patient taking differently: Take 40 mg by mouth at bedtime. ) 90 tablet 0  . bisoprolol (ZEBETA) 5 MG tablet Take 1 tablet (5 mg total) by mouth daily. 90 tablet 0  . Cholecalciferol (VITAMIN D-3) 125 MCG (5000 UT) TABS Take 1 tablet by mouth daily.    [name] lisinopril (ZESTRIL) 10 MG tablet Take 10 mg by mouth daily.    . nitroGLYCERIN (NITROSTAT) 0.4 MG SL tablet Place 1 tablet (0.4 mg total) under the tongue every 5 (five) minutes as needed for chest  pain. 30 tablet 0  . pantoprazole (PROTONIX) 40 MG tablet TAKE 1 TABLET BY MOUTH  DAILY (Patient taking differently: Take 40 mg by mouth 2 (two) times daily. ) 90 tablet 1  . potassium chloride SA (KLOR-CON) 20 MEQ tablet Take 20 mEq by mouth daily.     No current facility-administered medications for this visit.   Allergies:  Patient has no known allergies.   Social History: The patient  reports that he has been smoking cigarettes. He started smoking about 54 years ago. He has a 53.00 pack-year smoking history. He has never used smokeless tobacco. He reports that he does not drink alcohol and does not use drugs.   Family History: The patient's family history includes Cancer in his father; Early death in his mother; Heart attack in his father; Heart disease in his brother and brother.   ROS:  Please see the history of present illness. Otherwise, complete review of systems is positive for none.  All other systems are reviewed and negative.   Physical Exam: VS:  BP [name])  142/70   Pulse 66   Ht 5\' 5"  (1.651 m)   Wt 137 lb (62.1 kg)   SpO2 97%   BMI 22.80 kg/m , BMI Body mass index is 22.8 kg/m.  Wt Readings from Last 3 Encounters:  08/11/20 137 lb (62.1 kg)  08/08/20 135 lb (61.2 kg)  07/11/20 130 lb 12.8 oz (59.3 kg)    General: Patient appears comfortable at rest. Neck: Supple, no elevated JVP or carotid bruits, no thyromegaly. Lungs: Clear to auscultation, nonlabored breathing at rest. Cardiac: Regular rate and rhythm, no S3 or significant systolic murmur, no pericardial rub. Extremities: No pitting edema, distal pulses 2+. Skin: Warm and dry. Musculoskeletal: No kyphosis. Neuropsychiatric: Alert and oriented x3, affect grossly appropriate.  ECG:  EKG 06/12/2020 showed sinus rhythm rate of 69, LVH, anterior infarct, old, abnormal T consider ischemia, diffuse leads  Recent Labwork: 04/16/2020: Magnesium 1.9 06/12/2020: ALT 41; AST 31; BUN 17; Creatinine, Ser 1.05; Hemoglobin 9.3;  Platelets 259; Potassium 4.4; Sodium 142 08/07/2020: TSH <0.01     Component Value Date/Time   CHOL 131 11/04/2017 1417   TRIG 80 11/04/2017 1417   HDL 36 (L) 11/04/2017 1417   CHOLHDL 3.6 11/04/2017 1417   VLDL 15 03/12/2016 0704   LDLCALC 79 11/04/2017 1417    Other Studies Reviewed Today:  Echocardiogram 10/20/2019:  1. Left ventricular ejection fraction, by visual estimation, is 50%. The left ventricle has low normal function. There is mildly increased left ventricular hypertrophy. 2. Basal and mid inferolateral wall, basal inferior segment, mid anterolateral segment, and mid inferior segment are abnormal. 3. Elevated left ventricular end-diastolic pressure. 4. Left ventricular diastolic parameters are consistent with Grade I diastolic dysfunction (impaired relaxation). 5. Global right ventricle has normal systolic function.The right ventricular size is normal. No increase in right ventricular wall thickness. 6. Left atrial size was severely dilated. 7. Right atrial size was normal. 8. Mild mitral annular calcification. 9. Moderate aortic valve annular calcification. 10. The mitral valve is degenerative. Mild mitral valve regurgitation. 11. The tricuspid valve is grossly normal. Tricuspid valve regurgitation is not demonstrated. 12. The aortic valve is tricuspid. Aortic valve regurgitation is not visualized. Mild aortic valve sclerosis without stenosis. 13. There is Mild calcification of the aortic valve. 14. There is Mild thickening of the aortic valve. 15. The pulmonic valve was grossly normal. Pulmonic valve regurgitation is trivial. 16. Normal pulmonary artery systolic pressure. 17. The inferior vena cava is normal in size with greater than 50% respiratory variability, suggesting right atrial pressure of 3 mmHg.  Assessment and Plan:  1. Atrial fibrillation, unspecified type ([place])   2. CAD in native artery   3. Essential hypertension   4. Mixed hyperlipidemia    5. Tobacco abuse disorder   6. Ischemic cardiomyopathy    1. Atrial fibrillation, unspecified type ([place]) Rate and rhythm appear to be regular on auscultation today.  Last EKG on 06/12/2020 showed normal sinus rhythm.  He denies any palpitations or arrhythmias.  Continue bisoprolol 5 mg p.o. daily.  Continue Eliquis 5 mg p.o. twice daily.  2. CAD in native artery History of CAD with stenting of left circumflex September 2014.  Also significant RCA disease with 40% stenosis in proximal vessel followed by a long 80% stenosis in proximal to mid vessel.  Distal vessel was diffusely diseased 70%.  It was noted at that time if  he were to have recalcitrant chest pain refractory to medical therapy RCA would require extensive stenting. Denies any anginal or exertional  symptoms.  Continue nitroglycerin as needed for chest pain.  3. Essential hypertension Blood pressure 142/70 today.  Patient states his blood pressure usually in the 329V systolic.  Advised him to keep a check on his blood pressures.  Ideally would like for blood pressure to be at or below 130/80.  Patient verbalizes understanding.  Continue lisinopril 10 mg daily, amlodipine 5 mg p.o. daily.  Bisoprolol 5 mg p.o. daily.  4. Mixed hyperlipidemia Continue atorvastatin 40 mg daily.  Last lipid panel 2 years ago showed total cholesterol 131, HDL 36, triglycerides 80, LDL 79.  5. Tobacco abuse disorder Continues to smoke.  Highly advised tobacco cessation.  6. Ischemic cardiomyopathy Echocardiogram 10/20/2019 showed EF 50%, mild LVH, basal/mid inferior lateral wall, basal inferior segment, mid anterior lateral segment, mid inferior segment are abnormal.  Elevated LVEDP, G1 DD, severe LAE dilation, mild mitral regurgitation.  Medication Adjustments/Labs and Tests Ordered: Current medicines are reviewed at length with the patient today.  Concerns regarding medicines are outlined above.   Disposition: Follow-up with Dr. Harl Bowie or APP 6  months  Signed, Levell July, NP 08/11/2020 3:54 PM    [facility] [address], [place], [place] [address] Phone: [phone]; Fax: [phone]

## 2020-08-11 NOTE — Patient Instructions (Signed)
Medication Instructions:  Continue all current medications.   Labwork: none  Testing/Procedures: none  Follow-Up: 6 months   Any Other Special Instructions Will Be Listed Below (If Applicable).   If you need a refill on your cardiac medications before your next appointment, please call your pharmacy.  

## 2020-08-14 ENCOUNTER — Ambulatory Visit: Payer: Medicare Other | Admitting: Neurology

## 2020-08-15 ENCOUNTER — Encounter: Payer: Self-pay | Admitting: "Endocrinology

## 2020-08-15 ENCOUNTER — Telehealth (INDEPENDENT_AMBULATORY_CARE_PROVIDER_SITE_OTHER): Payer: Medicare Other | Admitting: "Endocrinology

## 2020-08-15 DIAGNOSIS — E0590 Thyrotoxicosis, unspecified without thyrotoxic crisis or storm: Secondary | ICD-10-CM | POA: Diagnosis not present

## 2020-08-15 NOTE — Progress Notes (Signed)
08/15/2020                                   Endocrinology Telehealth Visit Follow up Note -During COVID -19 Pandemic  This visit type was conducted  via phone due to national recommendations for restrictions regarding the COVID-19 Pandemic  in an effort to limit this patient's exposure and mitigate transmission of the corona virus.   I connected with Ronald Mcdowell on 08/15/2020   by telephone and verified that I am speaking with the correct person using two identifiers. Ronald Mcdowell, 10/06/1946. he has verbally consented to this visit.  I was in my office and patient was in his residence. No other persons were with me during the encounter. All issues noted in this document were discussed and addressed. The format was not optimal for physical exam.     Subjective:    Patient ID: Ronald Mcdowell, male    DOB: Jan 15, 1946, PCP Doree Albee, MD.   Past Medical History:  Diagnosis Date  . Anemia   . Coronary artery disease    a. h/o MI in 2000 w/ prior LCX stenting;  b. 7.2014 Abnl Cardiolite, EF 41%, mod-large inferolat scar;  c. 07/2013 Cath/PCI: LM nl, LAD 10-20, D1 40-50p, LCX 95-99 @ distal stent margin (2.5x20 Promus Premier DES), RCA dom, 40p, 57m, 70d, EF 35-40%.  . Elevated cholesterol   . GERD (gastroesophageal reflux disease)   . Hemorrhoids   . Hypertension   . Ischemic cardiomyopathy    a. 07/2013 EF 35-40% by LV gram.  . Myocardial infarction ([place])   . Reflux esophagitis   . Spondylosis of cervical joint    C3-4, C6-7  . Tobacco user   . Vitamin D deficiency disease 08/24/2019    Past Surgical History:  Procedure Laterality Date  . BIOPSY  01/09/2018   Procedure: BIOPSY;  Surgeon: Daneil Dolin, MD;  [facility];  Service: Endoscopy;;  esophageal biopsies  . CARDIAC CATHETERIZATION    . COLONOSCOPY    . COLONOSCOPY N/A 01/25/2016   RMR: normal ileocolonoscopy ( anal canal hemorrhoids)  . CORONARY ANGIOPLASTY WITH STENT PLACEMENT  08/23/2013    mid circumflex  DES    by Dr Martinique  . CORONARY STENT PLACEMENT  2000  . ESOPHAGOGASTRODUODENOSCOPY N/A 01/25/2016   RMR: Reflux esophagitits. Cervical web with passage of the scope. Status post esophageal biosy. Hiatal hernia.   [name] ESOPHAGOGASTRODUODENOSCOPY N/A 01/09/2018   Rourk: cervical/proximal esophageal web s/p dilation, markedly abnormal esophagus c/w biopsy proven eosinophilic esophagitis. small hiatal hernia.  [name] ESOPHAGOGASTRODUODENOSCOPY N/A 05/31/2020   Procedure: ESOPHAGOGASTRODUODENOSCOPY (EGD);  Surgeon: Daneil Dolin, MD;  [facility];  Service: Endoscopy;  Laterality: N/A;  8:30am  . LEFT HEART CATHETERIZATION WITH CORONARY ANGIOGRAM N/A 08/23/2013   Procedure: LEFT HEART CATHETERIZATION WITH CORONARY ANGIOGRAM;  Surgeon: Peter M Martinique, MD;  Location: [facility] CATH LAB;  Service: Cardiovascular;  Laterality: N/A;  . [name] DILATION N/A 01/09/2018   Procedure: [name] DILATION;  Surgeon: Daneil Dolin, MD;  [facility];  Service: Endoscopy;  Laterality: N/A;  . [name] DILATION N/A 05/31/2020   Procedure: [name] DILATION;  Surgeon: Daneil Dolin, MD;  [facility];  Service: Endoscopy;  Laterality: N/A;  . TRANSURETHRAL RESECTION OF PROSTATE  01/09/2012   Procedure: TRANSURETHRAL RESECTION OF THE PROSTATE (TURP);  Surgeon: Marissa Nestle, MD;  Location: [facility];  Service: Urology;  Laterality: N/A;    Social History   Socioeconomic History  . Marital status: Married    Spouse name: [name]  . Number of children: Not on file  . Years of education: 11th grade  . Highest education level: 11th grade  Occupational History  . Occupation: Librarian, academic  Tobacco Use  . Smoking status: Current Every Day Smoker    Packs/day: 1.00    Years: 53.00    Pack years: 53.00    Types: Cigarettes    Start date: 12/09/1965  . Smokeless tobacco: Never Used  . Tobacco comment: 3/4 pack of cigarettes daily; smoked since 74 years old  Vaping Use  . Vaping Use:  Never used  Substance and Sexual Activity  . Alcohol use: No    Alcohol/week: 0.0 standard drinks    Comment: No bowel since year 2000.  Some previous heavy use.  . Drug use: No  . Sexual activity: Yes    Birth control/protection: None  Other Topics Concern  . 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   Social Determinants of Health   Financial Resource Strain:   . Difficulty of Paying Living Expenses: Not on file  Food Insecurity:   . Worried About Charity fundraiser in the Last Year: Not on file  . Ran Out of Food in the Last Year: Not on file  Transportation Needs:   . Lack of Transportation (Medical): Not on file  . Lack of Transportation (Non-Medical): Not on file  Physical Activity:   . Days of Exercise per Week: Not on file  . Minutes of Exercise per Session: Not on file  Stress:   . Feeling of Stress : Not on file  Social Connections:   . Frequency of Communication with Friends and Family: Not on file  . Frequency of Social Gatherings with Friends and Family: Not on file  . Attends Religious Services: Not on file  . Active Member of Clubs or Organizations: Not on file  . Attends Archivist Meetings: Not on file  . Marital Status: Not on file    Family History  Problem Relation Age of Onset  . Early death Mother        childbirth  . Heart attack Father   . Cancer Father   . Heart disease Brother        CABG  . Heart disease Brother        slight heart attack  . Anesthesia problems Neg Hx   . Hypotension Neg Hx   . Malignant hyperthermia Neg Hx   . Pseudochol deficiency Neg Hx   . Colon cancer Neg Hx   . Liver disease Neg Hx   . Gastric cancer Neg Hx   . Esophageal cancer Neg Hx     Outpatient Encounter Medications as of 08/15/2020  Medication Sig  . amLODipine (NORVASC) 5 MG tablet TAKE 1 TABLET BY MOUTH  DAILY (Patient taking differently: Take  5 mg by mouth daily. )  . apixaban (ELIQUIS) 5 MG TABS tablet TAKE 1 TABLET(5 MG) BY MOUTH TWICE DAILY (Patient taking differently: Take 5 mg by mouth 2 (two) times daily. )  . atorvastatin (LIPITOR) 40 MG tablet TAKE 1 TABLET BY MOUTH  DAILY (Patient taking differently: Take 40 mg by mouth at bedtime. )  . bisoprolol (ZEBETA) 5 MG tablet Take 1 tablet (5 mg total) by  mouth daily.  . Cholecalciferol (VITAMIN D-3) 125 MCG (5000 UT) TABS Take 1 tablet by mouth daily.  [name] lisinopril (ZESTRIL) 10 MG tablet Take 10 mg by mouth daily.  . nitroGLYCERIN (NITROSTAT) 0.4 MG SL tablet Place 1 tablet (0.4 mg total) under the tongue every 5 (five) minutes as needed for chest pain.  . pantoprazole (PROTONIX) 40 MG tablet TAKE 1 TABLET BY MOUTH  DAILY (Patient taking differently: Take 40 mg by mouth 2 (two) times daily. )  . potassium chloride SA (KLOR-CON) 20 MEQ tablet Take 20 mEq by mouth daily.  . [DISCONTINUED] amLODipine (NORVASC) 5 MG tablet Take 1 tablet (5 mg total) by mouth daily.  . [DISCONTINUED] atorvastatin (LIPITOR) 40 MG tablet Take 1 tablet (40 mg total) by mouth at bedtime. Reported on 11/30/2015  . [DISCONTINUED] atorvastatin (LIPITOR) 40 MG tablet TAKE 1 TABLET(40 MG) BY MOUTH DAILY (Patient taking differently: Take 40 mg by mouth daily. )  . [DISCONTINUED] bisoprolol (ZEBETA) 5 MG tablet TAKE 1 TABLET BY MOUTH DAILY  . [DISCONTINUED] lisinopril (ZESTRIL) 20 MG tablet Take 20 mg by mouth daily.   No facility-administered encounter medications on file as of 08/15/2020.    ALLERGIES: No Known Allergies  VACCINATION STATUS: Immunization History  Administered Date(s) Administered  . Influenza,inj,Quad PF,6+ Mos 08/24/2013, 11/04/2017  . Influenza-Unspecified 09/26/2019  . Moderna SARS-COVID-2 Vaccination 02/15/2020, 03/14/2020  . Pneumococcal Conjugate-13 04/22/2019  . Td 02/23/2017     HPI  Ronald Mcdowell is 74 y.o. male who presents today with a medical history as above. he is being  seen in telehealth via telephone in follow-up after he was seen in consultation for hyperthyroidism.  PMD:  Doree Albee, MD.  he was treated with I-131 thyroid ablation for Graves' disease on May 09, 2020.  He reports significant improvement in his prior symptoms.  He has gained 35 pounds since last visit, half of what he lost when he was symptomatic of thyrotoxicosis.    He sleeps better, has consistent energy level.  His previsit thyroid function tests are consistent with treatment response/effect, however not hypothyroid yet.  he denies dysphagia, choking, shortness of breath, no recent voice change.    he denies family history of thyroid dysfunction, denies family hx of thyroid cancer.  He denies any exposure to amiodarone. he denies personal history of goiter.                           Review of systems Limited as above.   Objective:    There were no vitals taken for this visit.  Wt Readings from Last 3 Encounters:  08/11/20 137 lb (62.1 kg)  08/08/20 135 lb (61.2 kg)  07/11/20 130 lb 12.8 oz (59.3 kg)                                             CMP     Component Value Date/Time   NA 142 06/12/2020 0811   K 4.4 06/12/2020 0811   CL 111 06/12/2020 0811   CO2 21 (L) 06/12/2020 0811   GLUCOSE 97 06/12/2020 0811   BUN 17 06/12/2020 0811   CREATININE 1.05 06/12/2020 0811   CREATININE 1.14 06/07/2020 1500   CALCIUM 5.9 (LL) 06/12/2020 0811   PROT 7.3 06/12/2020 0811   ALBUMIN 3.4 (L) 06/12/2020 0811   AST 31  06/12/2020 0811   ALT 41 06/12/2020 0811   ALKPHOS 68 06/12/2020 0811   BILITOT 0.6 06/12/2020 0811   GFRNONAA >60 06/12/2020 0811   GFRNONAA 64 06/07/2020 1500   GFRAA >60 06/12/2020 0811   GFRAA 74 06/07/2020 1500     CBC    Component Value Date/Time   WBC 11.8 (H) 06/12/2020 0811   RBC 3.32 (L) 06/12/2020 0811   HGB 9.3 (L) 06/12/2020 0811   HCT 29.7 (L) 06/12/2020 0811   PLT 259 06/12/2020 0811   MCV 89.5 06/12/2020 0811   MCH 28.0 06/12/2020  0811   MCHC 31.3 06/12/2020 0811   RDW 18.9 (H) 06/12/2020 0811   LYMPHSABS 2.1 06/12/2020 0811   MONOABS 1.0 06/12/2020 0811   EOSABS 0.2 06/12/2020 0811   BASOSABS 0.0 06/12/2020 0811     Diabetic Labs (most recent): Lab Results  Component Value Date   HGBA1C 5.9 (H) 04/10/2015   HGBA1C 5.5 06/06/2013    Lipid Panel     Component Value Date/Time   CHOL 131 11/04/2017 1417   TRIG 80 11/04/2017 1417   HDL 36 (L) 11/04/2017 1417   CHOLHDL 3.6 11/04/2017 1417   VLDL 15 03/12/2016 0704   LDLCALC 79 11/04/2017 1417     Lab Results  Component Value Date   TSH <0.01 (L) 08/07/2020   TSH <0.01 (L) 07/04/2020   TSH 0.01 (L) 03/14/2020   TSH 0.674 04/10/2015   TSH 0.455 06/06/2013   FREET4 1.4 08/07/2020   FREET4 2.4 (H) 07/04/2020    Thyroid uptake and scan on April 27, 2020 FINDINGS: Homogeneous tracer distribution in both thyroid lobes.  No focal areas of increased or decreased tracer localization.  4 hour I-123 uptake = 41% (normal 5-20%)  ,  24 hour I-123 uptake = 72% (normal 10-30%)  IMPRESSION: Normal thyroid scan.  Elevated 4 hour and 24 hour radio iodine uptakes consistent with hyperthyroidism.  Overall findings consistent with Graves disease.   May 09, 2020 nuclear medicine IMPRESSION: Per oral administration of I-131 sodium iodide for the treatment of hyperthyroidism.   Assessment & Plan:   1. Hyperthyroidism  2.  Graves' disease  -He is status post I-131 therapy for hyperthyroidism from Graves' disease on May 09, 2020.  His clinical presentation and his previsit labs are consistent with treatment effect. He is not hypothyroid yet.  He is not ready for initiation of thyroid hormone replacement.    -Patient is made aware of the high likelihood of post ablative hypothyroidism with subsequent need for lifelong thyroid hormone replacement.  His pulse rate is controlled at 70-80, on beta-blocker. -He will return in 6 weeks with repeat thyroid  function tests. -I did not initiate any prescription for him today.  -Patient is advised to maintain close follow up with Doree Albee, MD for primary care needs.      - Time spent on this patient care encounter:  20 minutes of which 50% was spent in  counseling and the rest reviewing  his current and  previous labs / studies and medications  doses and developing a plan for long term care. Ronald Mcdowell  participated in the discussions, expressed understanding, and voiced agreement with the above plans.  All questions were answered to his satisfaction. he is encouraged to contact clinic should he have any questions or concerns prior to his return visit.    Follow up plan: Return in about 6 weeks (around 09/26/2020) for F/U with Pre-visit Labs.   Thank  you for involving me in the care of this pleasant patient, and I will continue to update you with his progress.  Glade Lloyd, MD [facility] Endocrinology [place] [facility] Phone: [phone]  Fax: [phone]   08/15/2020, 5:37 PM  This note was partially dictated with voice recognition software. Similar sounding words can be transcribed inadequately or may not  be corrected upon review.

## 2020-08-17 DIAGNOSIS — L438 Other lichen planus: Secondary | ICD-10-CM | POA: Diagnosis not present

## 2020-08-27 ENCOUNTER — Other Ambulatory Visit (INDEPENDENT_AMBULATORY_CARE_PROVIDER_SITE_OTHER): Payer: Self-pay | Admitting: Internal Medicine

## 2020-09-07 ENCOUNTER — Other Ambulatory Visit: Payer: Self-pay | Admitting: "Endocrinology

## 2020-09-07 DIAGNOSIS — E0590 Thyrotoxicosis, unspecified without thyrotoxic crisis or storm: Secondary | ICD-10-CM | POA: Diagnosis not present

## 2020-09-08 LAB — TSH: TSH: <0.01 mIU/L — ABNORMAL LOW (ref 0.40–4.50)

## 2020-09-08 LAB — T4, FREE: Free T4: 1.2 ng/dL (ref 0.8–1.8)

## 2020-09-10 ENCOUNTER — Other Ambulatory Visit (INDEPENDENT_AMBULATORY_CARE_PROVIDER_SITE_OTHER): Payer: Self-pay | Admitting: Internal Medicine

## 2020-09-10 DIAGNOSIS — I10 Essential (primary) hypertension: Secondary | ICD-10-CM

## 2020-09-10 DIAGNOSIS — E785 Hyperlipidemia, unspecified: Secondary | ICD-10-CM

## 2020-09-10 DIAGNOSIS — K2100 Gastro-esophageal reflux disease with esophagitis, without bleeding: Secondary | ICD-10-CM

## 2020-09-19 ENCOUNTER — Encounter: Payer: Self-pay | Admitting: Gastroenterology

## 2020-09-19 NOTE — Progress Notes (Deleted)
Referring Provider: Doree Albee, MD Primary Care Physician:  Doree Albee, MD Primary GI Physician: Dr. Gala Romney  No chief complaint on file.   HPI:   Ronald Mcdowell is a 74 y.o. male with history of GERD, reflux esophagitis, dysphagia, Elevated LFTs in 2016 then again in 2021, multiple hepatic cyst, atrial fibrillation on Eliquis, ischemic cardiomyopathy, HTN, anemia, weight loss, hyperthyroidism/thyrotoxicosis in the setting of Graves' disease s/p I-131 thyroid ablation in June 2021. He is presenting today for follow-up. Last seen in out office in May 2021 for further evaluation of weight loss, rectal bleeding, diarrhea, abdominal pain as well as dysphagia and abnormal LFTs.   He had previously been seen in the emergency room in May with 3 episodes of small-volume hematochezia, abdominal pain, vomiting/diarrhea.  At the time of his office visit, he reported he only had a very small amount of rectal bleeding in March and none since then.He continue with 3-4 loose BMs daily.  No abdominal pain.  Burning sensation in chest with eating.  Solid food dysphagia/painful swallowing.  Vomiting or diarrhea after eating.  He was on Protonix daily.  Reported 18 pound weight loss in the past few weeks.  Thyroid issues were currently being evaluated.  Query whether thyrotoxicosis/hyperthyroidism may be contributing to elevated LFTs and diarrhea.  Plan to complete stool studies, monitor LFTs with additional work-up if elevation persisted, increase Protonix to twice daily, Carafate, EGD with possible dilation.  Stool studies were not completed.   EGD 05/31/2020: Had normal esophagus consistent with EOE s/p dilation and biopsy.  Pathology with squamous mucosa with increased eosinophils consistent with EOE (greater than 30 per high-power field).  Recommended continuing PPI twice daily.  Stated probably need repeat EGD with repeat dilation after follow-up in 6 weeks.  Would decide then about topical  steroids.  Reviewed recent labs in July 2021. Hemoglobin dropped from 13.6 in May 2021 to 9.0 with normocytic indices on 7/14. Iron panel with iron 50, saturation 25%, ferritin elevated at 473. B12 428 and folate 14.4. Calcium low at 5.9. LFTs within normal limits.   Today:   Dysphagia/odynophagia:  GERD:   N/V:   Diarrhea:   Anemia:   Elevated LFTs:      Last colonoscopy March 2017: Normal exam except for anal canal hemorrhoids.   Past Medical History:  Diagnosis Date  . Anemia   . Coronary artery disease    a. h/o MI in 2000 w/ prior LCX stenting;  b. 7.2014 Abnl Cardiolite, EF 41%, mod-large inferolat scar;  c. 07/2013 Cath/PCI: LM nl, LAD 10-20, D1 40-50p, LCX 95-99 @ distal stent margin (2.5x20 Promus Premier DES), RCA dom, 40p, 22m, 70d, EF 35-40%.  . Elevated cholesterol   . GERD (gastroesophageal reflux disease)   . Hemorrhoids   . Hypertension   . Ischemic cardiomyopathy    a. 07/2013 EF 35-40% by LV gram.  . Myocardial infarction ([place])   . Reflux esophagitis   . Spondylosis of cervical joint    C3-4, C6-7  . Tobacco user   . Vitamin D deficiency disease 08/24/2019    Past Surgical History:  Procedure Laterality Date  . BIOPSY  01/09/2018   Procedure: BIOPSY;  Surgeon: Daneil Dolin, MD;  [facility];  Service: Endoscopy;;  esophageal biopsies  . CARDIAC CATHETERIZATION    . COLONOSCOPY    . COLONOSCOPY N/A 01/25/2016   RMR: normal ileocolonoscopy ( anal canal hemorrhoids)  . CORONARY ANGIOPLASTY WITH STENT PLACEMENT  08/23/2013   mid circumflex  DES    by Dr Martinique  . CORONARY STENT PLACEMENT  2000  . ESOPHAGOGASTRODUODENOSCOPY N/A 01/25/2016   RMR: Reflux esophagitits. Cervical web with passage of the scope. Status post esophageal biosy. Hiatal hernia.   [name] ESOPHAGOGASTRODUODENOSCOPY N/A 01/09/2018   Rourk: cervical/proximal esophageal web s/p dilation, markedly abnormal esophagus c/w biopsy proven eosinophilic esophagitis. small hiatal hernia.    [name] ESOPHAGOGASTRODUODENOSCOPY N/A 05/31/2020   Procedure: ESOPHAGOGASTRODUODENOSCOPY (EGD);  Surgeon: Daneil Dolin, MD;  [facility];  Service: Endoscopy;  Laterality: N/A;  8:30am  . LEFT HEART CATHETERIZATION WITH CORONARY ANGIOGRAM N/A 08/23/2013   Procedure: LEFT HEART CATHETERIZATION WITH CORONARY ANGIOGRAM;  Surgeon: Peter M Martinique, MD;  Location: [facility] CATH LAB;  Service: Cardiovascular;  Laterality: N/A;  . [name] DILATION N/A 01/09/2018   Procedure: [name] DILATION;  Surgeon: Daneil Dolin, MD;  [facility];  Service: Endoscopy;  Laterality: N/A;  . [name] DILATION N/A 05/31/2020   Procedure: [name] DILATION;  Surgeon: Daneil Dolin, MD;  [facility];  Service: Endoscopy;  Laterality: N/A;  . TRANSURETHRAL RESECTION OF PROSTATE  01/09/2012   Procedure: TRANSURETHRAL RESECTION OF THE PROSTATE (TURP);  Surgeon: Marissa Nestle, MD;  Location: [facility];  Service: Urology;  Laterality: N/A;    Current Outpatient Medications  Medication Sig Dispense Refill  . amLODipine (NORVASC) 5 MG tablet TAKE 1 TABLET BY MOUTH  DAILY (Patient taking differently: Take 5 mg by mouth daily. ) 90 tablet 3  . apixaban (ELIQUIS) 5 MG TABS tablet TAKE 1 TABLET(5 MG) BY MOUTH TWICE DAILY (Patient taking differently: Take 5 mg by mouth 2 (two) times daily. ) 180 tablet 2  . atorvastatin (LIPITOR) 40 MG tablet Take 1 tablet (40 mg total) by mouth at bedtime. 90 tablet 1  . bisoprolol (ZEBETA) 5 MG tablet Take 1 tablet (5 mg total) by mouth daily. 90 tablet 0  . Cholecalciferol (VITAMIN D-3) 125 MCG (5000 UT) TABS Take 1 tablet by mouth daily.    [name] lisinopril (ZESTRIL) 10 MG tablet Take 10 mg by mouth daily.    [name] lisinopril (ZESTRIL) 20 MG tablet TAKE 1 TABLET BY MOUTH  DAILY 90 tablet 0  . nitroGLYCERIN (NITROSTAT) 0.4 MG SL tablet Place 1 tablet (0.4 mg total) under the tongue every 5 (five) minutes as needed for chest pain. 30 tablet 0  . pantoprazole (PROTONIX) 40 MG  tablet TAKE 1 TABLET BY MOUTH  DAILY (Patient taking differently: Take 40 mg by mouth 2 (two) times daily. ) 90 tablet 1  . potassium chloride SA (KLOR-CON) 20 MEQ tablet Take 20 mEq by mouth daily.     No current facility-administered medications for this visit.    Allergies as of 09/20/2020  . (No Known Allergies)    Family History  Problem Relation Age of Onset  . Early death Mother        childbirth  . Heart attack Father   . Cancer Father   . Heart disease Brother        CABG  . Heart disease Brother        slight heart attack  . Anesthesia problems Neg Hx   . Hypotension Neg Hx   . Malignant hyperthermia Neg Hx   . Pseudochol deficiency Neg Hx   . Colon cancer Neg Hx   . Liver disease Neg Hx   . Gastric cancer Neg Hx   . Esophageal cancer Neg Hx  Social History   Socioeconomic History  . Marital status: Married    Spouse name: [name]  . Number of children: Not on file  . Years of education: 11th grade  . Highest education level: 11th grade  Occupational History  . Occupation: Librarian, academic  Tobacco Use  . Smoking status: Current Every Day Smoker    Packs/day: 1.00    Years: 53.00    Pack years: 53.00    Types: Cigarettes    Start date: 12/09/1965  . Smokeless tobacco: Never Used  . Tobacco comment: 3/4 pack of cigarettes daily; smoked since 74 years old  Vaping Use  . Vaping Use: Never used  Substance and Sexual Activity  . Alcohol use: No    Alcohol/week: 0.0 standard drinks    Comment: No bowel since year 2000.  Some previous heavy use.  . Drug use: No  . Sexual activity: Yes    Birth control/protection: None  Other Topics Concern  . 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   Social Determinants of Health   Financial Resource Strain:   . Difficulty of Paying Living Expenses: Not on file  Food Insecurity:   . Worried About Charity fundraiser in  the Last Year: Not on file  . Ran Out of Food in the Last Year: Not on file  Transportation Needs:   . Lack of Transportation (Medical): Not on file  . Lack of Transportation (Non-Medical): Not on file  Physical Activity:   . Days of Exercise per Week: Not on file  . Minutes of Exercise per Session: Not on file  Stress:   . Feeling of Stress : Not on file  Social Connections:   . Frequency of Communication with Friends and Family: Not on file  . Frequency of Social Gatherings with Friends and Family: Not on file  . Attends Religious Services: Not on file  . Active Member of Clubs or Organizations: Not on file  . Attends Archivist Meetings: Not on file  . Marital Status: Not on file    Review of Systems: Gen: Denies fever, chills, anorexia. Denies fatigue, weakness, weight loss.  CV: Denies chest pain, palpitations, syncope, peripheral edema, and claudication. Resp: Denies dyspnea at rest, cough, wheezing, coughing up blood, and pleurisy. GI: Denies vomiting blood, jaundice, and fecal incontinence.   Denies dysphagia or odynophagia. Derm: Denies rash, itching, dry skin Psych: Denies depression, anxiety, memory loss, confusion. No homicidal or suicidal ideation.  Heme: Denies bruising, bleeding, and enlarged lymph nodes.  Physical Exam: There were no vitals taken for this visit. General:   Alert and oriented. No distress noted. Pleasant and cooperative.  Head:  Normocephalic and atraumatic. Eyes:  Conjuctiva clear without scleral icterus. Mouth:  Oral mucosa pink and moist. Good dentition. No lesions. Heart:  S1, S2 present without murmurs appreciated. Lungs:  Clear to auscultation bilaterally. No wheezes, rales, or rhonchi. No distress.  Abdomen:  +BS, soft, non-tender and non-distended. No rebound or guarding. No HSM or masses noted. Msk:  Symmetrical without gross deformities. Normal posture. Extremities:  Without edema. Neurologic:  Alert and  oriented x4 Psych:   Alert and cooperative. Normal mood and affect.

## 2020-09-20 ENCOUNTER — Ambulatory Visit: Payer: Medicare Other | Admitting: Gastroenterology

## 2020-09-20 ENCOUNTER — Encounter: Payer: Self-pay | Admitting: Internal Medicine

## 2020-09-27 ENCOUNTER — Encounter: Payer: Self-pay | Admitting: "Endocrinology

## 2020-09-27 ENCOUNTER — Ambulatory Visit: Payer: Medicare Other | Admitting: "Endocrinology

## 2020-09-27 ENCOUNTER — Telehealth (INDEPENDENT_AMBULATORY_CARE_PROVIDER_SITE_OTHER): Payer: Medicare Other | Admitting: "Endocrinology

## 2020-09-27 VITALS — Ht 65.0 in | Wt 138.0 lb

## 2020-09-27 DIAGNOSIS — E0590 Thyrotoxicosis, unspecified without thyrotoxic crisis or storm: Secondary | ICD-10-CM | POA: Insufficient documentation

## 2020-09-27 HISTORY — DX: Thyrotoxicosis, unspecified without thyrotoxic crisis or storm: E05.90

## 2020-09-27 NOTE — Progress Notes (Signed)
09/27/2020                                                    Endocrinology Telehealth Visit Follow up Note -During COVID -19 Pandemic  This visit type was conducted  via phone due to national recommendations for restrictions regarding the COVID-19 Pandemic  in an effort to limit this patient's exposure and mitigate transmission of the corona virus.   I connected with Ronald Mcdowell on 09/27/2020   by telephone and verified that I am speaking with the correct person using two identifiers. Ronald Mcdowell, 1946-02-25. he has verbally consented to this visit.  I was in my office and patient was in his residence. No other persons were with me during the encounter. All issues noted in this document were discussed and addressed. The format was not optimal for physical exam.   Subjective:    Patient ID: Ronald Mcdowell, male    DOB: 1946/05/10, PCP Doree Albee, MD.   Past Medical History:  Diagnosis Date  . Anemia   . Atrial fibrillation ([place])   . Coronary artery disease    a. h/o MI in 2000 w/ prior LCX stenting;  b. 7.2014 Abnl Cardiolite, EF 41%, mod-large inferolat scar;  c. 07/2013 Cath/PCI: LM nl, LAD 10-20, D1 40-50p, LCX 95-99 @ distal stent margin (2.5x20 Promus Premier DES), RCA dom, 40p, 68m, 70d, EF 35-40%.  . Elevated cholesterol   . GERD (gastroesophageal reflux disease)   . Graves disease 04/2020   s/p I-131 thyroid ablation   . Hemorrhoids   . Hypertension   . Hyperthyroidism 09/27/2020  . Ischemic cardiomyopathy    a. 07/2013 EF 35-40% by LV gram.; EF 50% on ECHO in November 2020  . Myocardial infarction ([place])   . Reflux esophagitis   . Spondylosis of cervical joint    C3-4, C6-7  . Tobacco user   . Vitamin D deficiency disease 08/24/2019    Past Surgical History:  Procedure Laterality Date  . BIOPSY  01/09/2018   Procedure: BIOPSY;  Surgeon: Daneil Dolin, MD;  [facility];  Service: Endoscopy;;  esophageal biopsies  . CARDIAC  CATHETERIZATION    . COLONOSCOPY    . COLONOSCOPY N/A 01/25/2016   RMR: normal ileocolonoscopy ( anal canal hemorrhoids)  . CORONARY ANGIOPLASTY WITH STENT PLACEMENT  08/23/2013   mid circumflex  DES    by Dr Martinique  . CORONARY STENT PLACEMENT  2000  . ESOPHAGOGASTRODUODENOSCOPY N/A 01/25/2016   RMR: Reflux esophagitits. Cervical web with passage of the scope. Status post esophageal biosy. Hiatal hernia.   [name] ESOPHAGOGASTRODUODENOSCOPY N/A 01/09/2018   Rourk: cervical/proximal esophageal web s/p dilation, markedly abnormal esophagus c/w biopsy proven eosinophilic esophagitis. small hiatal hernia.  [name] ESOPHAGOGASTRODUODENOSCOPY N/A 05/31/2020   Procedure: ESOPHAGOGASTRODUODENOSCOPY (EGD);  Surgeon: Daneil Dolin, MD;  [facility];  Service: Endoscopy;  Laterality: N/A;  8:30am  . LEFT HEART CATHETERIZATION WITH CORONARY ANGIOGRAM N/A 08/23/2013   Procedure: LEFT HEART CATHETERIZATION WITH CORONARY ANGIOGRAM;  Surgeon: Peter M Martinique, MD;  Location: [facility] CATH LAB;  Service: Cardiovascular;  Laterality: N/A;  . [name] DILATION N/A 01/09/2018   Procedure: [name] DILATION;  Surgeon: Daneil Dolin, MD;  [facility];  Service: Endoscopy;  Laterality: N/A;  . MALONEY DILATION N/A 05/31/2020  Procedure: MALONEY DILATION;  Surgeon: Daneil Dolin, MD;  [facility];  Service: Endoscopy;  Laterality: N/A;  . TRANSURETHRAL RESECTION OF PROSTATE  01/09/2012   Procedure: TRANSURETHRAL RESECTION OF THE PROSTATE (TURP);  Surgeon: Marissa Nestle, MD;  Location: [facility];  Service: Urology;  Laterality: N/A;    Social History   Socioeconomic History  . Marital status: Married    Spouse name: [name]  . Number of children: Not on file  . Years of education: 11th grade  . Highest education level: 11th grade  Occupational History  . Occupation: Librarian, academic  Tobacco Use  . Smoking status: Current Every Day Smoker    Packs/day: 1.00    Years: 53.00    Pack years: 53.00     Types: Cigarettes    Start date: 12/09/1965  . Smokeless tobacco: Never Used  . Tobacco comment: 3/4 pack of cigarettes daily; smoked since 74 years old  Vaping Use  . Vaping Use: Never used  Substance and Sexual Activity  . Alcohol use: No    Alcohol/week: 0.0 standard drinks    Comment: No bowel since year 2000.  Some previous heavy use.  . Drug use: No  . Sexual activity: Yes    Birth control/protection: None  Other Topics Concern  . 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   Social Determinants of Health   Financial Resource Strain:   . Difficulty of Paying Living Expenses: Not on file  Food Insecurity:   . Worried About Charity fundraiser in the Last Year: Not on file  . Ran Out of Food in the Last Year: Not on file  Transportation Needs:   . Lack of Transportation (Medical): Not on file  . Lack of Transportation (Non-Medical): Not on file  Physical Activity:   . Days of Exercise per Week: Not on file  . Minutes of Exercise per Session: Not on file  Stress:   . Feeling of Stress : Not on file  Social Connections:   . Frequency of Communication with Friends and Family: Not on file  . Frequency of Social Gatherings with Friends and Family: Not on file  . Attends Religious Services: Not on file  . Active Member of Clubs or Organizations: Not on file  . Attends Archivist Meetings: Not on file  . Marital Status: Not on file    Family History  Problem Relation Age of Onset  . Early death Mother        childbirth  . Heart attack Father   . Cancer Father   . Heart disease Brother        CABG  . Heart disease Brother        slight heart attack  . Anesthesia problems Neg Hx   . Hypotension Neg Hx   . Malignant hyperthermia Neg Hx   . Pseudochol deficiency Neg Hx   . Colon cancer Neg Hx   . Liver disease Neg Hx   . Gastric cancer Neg Hx   . Esophageal  cancer Neg Hx     Outpatient Encounter Medications as of 09/27/2020  Medication Sig  . amLODipine (NORVASC) 5 MG tablet TAKE 1 TABLET BY MOUTH  DAILY (Patient taking differently: Take 5 mg by mouth daily. )  . apixaban (ELIQUIS) 5 MG TABS tablet TAKE 1 TABLET(5 MG) BY MOUTH TWICE DAILY (Patient taking differently: Take 5 mg by mouth  2 (two) times daily. )  . atorvastatin (LIPITOR) 40 MG tablet Take 1 tablet (40 mg total) by mouth at bedtime.  . bisoprolol (ZEBETA) 5 MG tablet Take 1 tablet (5 mg total) by mouth daily.  . Cholecalciferol (VITAMIN D-3) 125 MCG (5000 UT) TABS Take 1 tablet by mouth daily.  [name] lisinopril (ZESTRIL) 20 MG tablet TAKE 1 TABLET BY MOUTH  DAILY  . nitroGLYCERIN (NITROSTAT) 0.4 MG SL tablet Place 1 tablet (0.4 mg total) under the tongue every 5 (five) minutes as needed for chest pain.  . pantoprazole (PROTONIX) 40 MG tablet TAKE 1 TABLET BY MOUTH  DAILY (Patient taking differently: Take 40 mg by mouth 2 (two) times daily. )  . potassium chloride SA (KLOR-CON) 20 MEQ tablet Take 20 mEq by mouth daily.  . [DISCONTINUED] amLODipine (NORVASC) 5 MG tablet Take 1 tablet (5 mg total) by mouth daily.  . [DISCONTINUED] atorvastatin (LIPITOR) 40 MG tablet Take 1 tablet (40 mg total) by mouth at bedtime. Reported on 11/30/2015  . [DISCONTINUED] atorvastatin (LIPITOR) 40 MG tablet TAKE 1 TABLET(40 MG) BY MOUTH DAILY (Patient taking differently: Take 40 mg by mouth daily. )  . [DISCONTINUED] bisoprolol (ZEBETA) 5 MG tablet TAKE 1 TABLET BY MOUTH DAILY  . [DISCONTINUED] lisinopril (ZESTRIL) 10 MG tablet Take 10 mg by mouth daily.  . [DISCONTINUED] lisinopril (ZESTRIL) 20 MG tablet Take 20 mg by mouth daily.   No facility-administered encounter medications on file as of 09/27/2020.    ALLERGIES: No Known Allergies  VACCINATION STATUS: Immunization History  Administered Date(s) Administered  . Influenza,inj,Quad PF,6+ Mos 08/24/2013, 11/04/2017  . Influenza-Unspecified 09/26/2019   . Moderna SARS-COVID-2 Vaccination 02/15/2020, 03/14/2020  . Pneumococcal Conjugate-13 04/22/2019  . Td 02/23/2017     HPI  JAMEE PACHOLSKI is 74 y.o. male who presents today with a medical history as above. he is being seen in telehealth via telephone in follow-up after he was seen in consultation for hyperthyroidism.  PMD:  Doree Albee, MD.  he was treated with I-131 thyroid ablation for Graves' disease on May 09, 2020.  He reports significant improvement in his prior symptoms.  He has gained 38 pounds overall, which is a good development for him.  He sleeps better, has consistent energy.  He denies palpitations, tremors, nor heat intolerance.   -  His previsit thyroid function tests are consistent with treatment response/effect, however not hypothyroid yet.  he denies dysphagia, choking, shortness of breath, no recent voice change.    he denies family history of thyroid dysfunction, denies family hx of thyroid cancer.  He denies any exposure to amiodarone. he denies personal history of goiter.                           Review of systems Limited as above.   Objective:    Ht 5\' 5"  (1.651 m)   Wt 138 lb (62.6 kg)   BMI 22.96 kg/m   Wt Readings from Last 3 Encounters:  09/27/20 138 lb (62.6 kg)  08/11/20 137 lb (62.1 kg)  08/08/20 135 lb (61.2 kg)                                             CMP     Component Value Date/Time   NA 142 06/12/2020 0811   K 4.4 06/12/2020 3235  CL 111 06/12/2020 0811   CO2 21 (L) 06/12/2020 0811   GLUCOSE 97 06/12/2020 0811   BUN 17 06/12/2020 0811   CREATININE 1.05 06/12/2020 0811   CREATININE 1.14 06/07/2020 1500   CALCIUM 5.9 (LL) 06/12/2020 0811   PROT 7.3 06/12/2020 0811   ALBUMIN 3.4 (L) 06/12/2020 0811   AST 31 06/12/2020 0811   ALT 41 06/12/2020 0811   ALKPHOS 68 06/12/2020 0811   BILITOT 0.6 06/12/2020 0811   GFRNONAA >60 06/12/2020 0811   GFRNONAA 64 06/07/2020 1500   GFRAA >60 06/12/2020 0811   GFRAA 74 06/07/2020  1500     CBC    Component Value Date/Time   WBC 11.8 (H) 06/12/2020 0811   RBC 3.32 (L) 06/12/2020 0811   HGB 9.3 (L) 06/12/2020 0811   HCT 29.7 (L) 06/12/2020 0811   PLT 259 06/12/2020 0811   MCV 89.5 06/12/2020 0811   MCH 28.0 06/12/2020 0811   MCHC 31.3 06/12/2020 0811   RDW 18.9 (H) 06/12/2020 0811   LYMPHSABS 2.1 06/12/2020 0811   MONOABS 1.0 06/12/2020 0811   EOSABS 0.2 06/12/2020 0811   BASOSABS 0.0 06/12/2020 0811     Diabetic Labs (most recent): Lab Results  Component Value Date   HGBA1C 5.9 (H) 04/10/2015   HGBA1C 5.5 06/06/2013    Lipid Panel     Component Value Date/Time   CHOL 131 11/04/2017 1417   TRIG 80 11/04/2017 1417   HDL 36 (L) 11/04/2017 1417   CHOLHDL 3.6 11/04/2017 1417   VLDL 15 03/12/2016 0704   LDLCALC 79 11/04/2017 1417     Lab Results  Component Value Date   TSH <0.01 (L) 09/07/2020   TSH <0.01 (L) 08/07/2020   TSH <0.01 (L) 07/04/2020   TSH 0.01 (L) 03/14/2020   TSH 0.674 04/10/2015   TSH 0.455 06/06/2013   FREET4 1.2 09/07/2020   FREET4 1.4 08/07/2020   FREET4 2.4 (H) 07/04/2020    Thyroid uptake and scan on April 27, 2020 FINDINGS: Homogeneous tracer distribution in both thyroid lobes.  No focal areas of increased or decreased tracer localization.  4 hour I-123 uptake = 41% (normal 5-20%)  ,  24 hour I-123 uptake = 72% (normal 10-30%)  IMPRESSION: Normal thyroid scan.  Elevated 4 hour and 24 hour radio iodine uptakes consistent with hyperthyroidism.  Overall findings consistent with Graves disease.   May 09, 2020 nuclear medicine IMPRESSION: Per oral administration of I-131 sodium iodide for the treatment of hyperthyroidism.   Assessment & Plan:   1. Hyperthyroidism  2.  Graves' disease  -He is status post I-131 therapy for hyperthyroidism from Graves' disease on May 09, 2020.  His clinical presentation and his previsit thyroid function tests are consistent with treatment effect.  He is not  hypothyroid yet.   He is not ready for initiation of thyroid hormone replacement.    -Patient is made aware of the high likelihood of post ablative hypothyroidism with subsequent need for lifelong thyroid hormone replacement.  His pulse rate is controlled at 70-80, on beta-blocker. -He will return in 9 weeks with repeat thyroid function tests. -I did not initiate any prescription for him today.  -Patient is advised to maintain close follow up with Doree Albee, MD for primary care needs.     - Time spent on this patient care encounter:  20 minutes of which 50% was spent in  counseling and the rest reviewing  his current and  previous labs / studies and medications  doses and developing a plan for long term care. Ronald Mcdowell  participated in the discussions, expressed understanding, and voiced agreement with the above plans.  All questions were answered to his satisfaction. he is encouraged to contact clinic should he have any questions or concerns prior to his return visit.    Follow up plan: Return in about 9 weeks (around 11/29/2020) for F/U with Pre-visit Labs.   Thank you for involving me in the care of this pleasant patient, and I will continue to update you with his progress.  Glade Lloyd, MD [facility] Phone: [phone]  Fax: [phone]   09/27/2020, 8:33 PM  This note was partially dictated with voice recognition software. Similar sounding words can be transcribed inadequately or may not  be corrected upon review.

## 2020-10-31 DIAGNOSIS — E0590 Thyrotoxicosis, unspecified without thyrotoxic crisis or storm: Secondary | ICD-10-CM | POA: Diagnosis not present

## 2020-11-01 LAB — T3, FREE: T3, Free: 4.1 pg/mL (ref 2.0–4.4)

## 2020-11-01 LAB — T4, FREE: Free T4: 1.77 ng/dL (ref 0.82–1.77)

## 2020-11-01 LAB — TSH: TSH: <0.005 u[IU]/mL — ABNORMAL LOW (ref 0.450–4.500)

## 2020-11-07 IMAGING — NM NM THYROID IMAGING W/ UPTAKE MULTI (4&24 HR)
4 series · 4 of 4 positions shown · non-contrast
Comparison: None.

CLINICAL DATA: Hyperthyroidism, weight loss, decreased appetite,
constipation, tremors, increased thirst and weakness

EXAM:
THYROID SCAN AND UPTAKE - 4 AND 24 HOURS
TECHNIQUE: Following oral administration of I-UW3 capsule, anterior planar
imaging was acquired at 24 hours. Thyroid uptake was calculated with
a thyroid probe at 4-6 hours and 24 hours.
RADIOPHARMACEUTICALS:  301 uCi I-UW3 sodium iodide p.o.

[Series 1: anterior · 1.18mm/px · 1 of 1 slices shown]
[im 1/1  full-range]
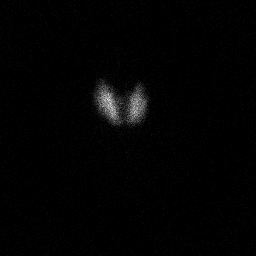

[Series 2: ant w marker · 1.18mm/px · 1 of 1 slices shown]
[im 1/1  full-range]
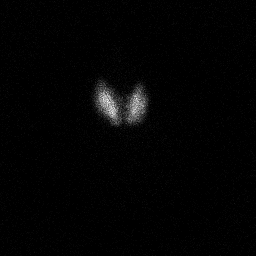

[Series 3: lao · 1.18mm/px · 1 of 1 slices shown]
[im 1/1  full-range]
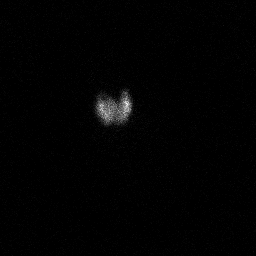

[Series 4: rao · 1.18mm/px · 1 of 1 slices shown]
[im 1/1]
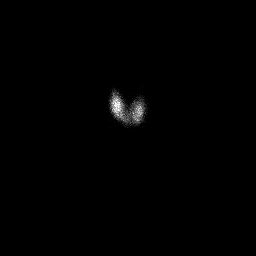

[4 of 4 positions shown; findings below may reference images not displayed]

FINDINGS: Homogeneous tracer distribution in both thyroid lobes.

No focal areas of increased or decreased tracer localization.

4 hour I-UW3 uptake = 41% (normal 5-20%)

24 hour I-UW3 uptake = 72% (normal 10-30%)
IMPRESSION: Normal thyroid scan.

Elevated 4 hour and 24 hour radio iodine uptakes consistent with
hyperthyroidism.

Overall findings consistent with Graves disease.

## 2020-11-19 ENCOUNTER — Other Ambulatory Visit (INDEPENDENT_AMBULATORY_CARE_PROVIDER_SITE_OTHER): Payer: Self-pay | Admitting: Internal Medicine

## 2020-11-20 IMAGING — NM NM RAI THERAPY FOR HYPERTHYROIDISM
1 series · 1 of 1 positions shown · non-contrast
Comparison: None.

CLINICAL DATA: Hyperthyroidism

EXAM:
RADIOACTIVE IODINE THERAPY FOR HYPERTHYROIDISM
TECHNIQUE: Radioactive iodine prescribed by myself. The risks and benefits of
radioactive iodine therapy were discussed with the patient in detail
by Dr. Tarquino. Alternative therapies were also mentioned.
Radiation safety was discussed with the patient, including how to
protect the general public from exposure. There were no barriers to
communication. Written consent was obtained. The patient then
received a capsule containing the radiopharmaceutical.
The patient will follow-up with the referring physician.
RADIOPHARMACEUTICALS:  12.44 mCi 9-MKM sodium iodide orally

[Series 1: bone statics · 2.07mm/px · 1 of 1 slices shown]
[im 1/1]
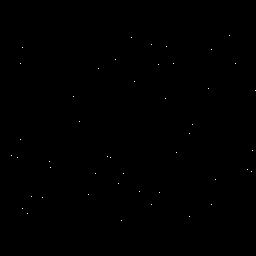

[1 of 1 positions shown; findings below may reference images not displayed]

IMPRESSION: Per oral administration of 9-MKM sodium iodide for the treatment of
hyperthyroidism.

## 2020-11-22 ENCOUNTER — Other Ambulatory Visit (INDEPENDENT_AMBULATORY_CARE_PROVIDER_SITE_OTHER): Payer: Self-pay | Admitting: Internal Medicine

## 2020-11-22 ENCOUNTER — Other Ambulatory Visit (INDEPENDENT_AMBULATORY_CARE_PROVIDER_SITE_OTHER): Payer: Self-pay | Admitting: Nurse Practitioner

## 2020-11-22 DIAGNOSIS — I10 Essential (primary) hypertension: Secondary | ICD-10-CM

## 2020-11-22 DIAGNOSIS — K2100 Gastro-esophageal reflux disease with esophagitis, without bleeding: Secondary | ICD-10-CM

## 2020-11-22 DIAGNOSIS — E785 Hyperlipidemia, unspecified: Secondary | ICD-10-CM

## 2020-11-23 ENCOUNTER — Other Ambulatory Visit: Payer: Self-pay | Admitting: *Deleted

## 2020-11-23 MED ORDER — APIXABAN 5 MG PO TABS: ORAL_TABLET | ORAL | 2 refills | Status: DC

## 2020-11-30 ENCOUNTER — Ambulatory Visit: Payer: Medicare Other | Admitting: "Endocrinology

## 2020-12-04 ENCOUNTER — Ambulatory Visit (INDEPENDENT_AMBULATORY_CARE_PROVIDER_SITE_OTHER): Payer: Medicare Other | Admitting: Internal Medicine

## 2020-12-04 ENCOUNTER — Other Ambulatory Visit: Payer: Self-pay

## 2020-12-04 ENCOUNTER — Encounter (INDEPENDENT_AMBULATORY_CARE_PROVIDER_SITE_OTHER): Payer: Self-pay | Admitting: Internal Medicine

## 2020-12-04 VITALS — BP 136/72 | HR 68 | Temp 97.1°F | Ht 64.0 in | Wt 142.2 lb

## 2020-12-04 DIAGNOSIS — E0500 Thyrotoxicosis with diffuse goiter without thyrotoxic crisis or storm: Secondary | ICD-10-CM | POA: Diagnosis not present

## 2020-12-04 DIAGNOSIS — Z23 Encounter for immunization: Secondary | ICD-10-CM

## 2020-12-04 DIAGNOSIS — I25118 Atherosclerotic heart disease of native coronary artery with other forms of angina pectoris: Secondary | ICD-10-CM

## 2020-12-04 DIAGNOSIS — I10 Essential (primary) hypertension: Secondary | ICD-10-CM

## 2020-12-04 DIAGNOSIS — E559 Vitamin D deficiency, unspecified: Secondary | ICD-10-CM

## 2020-12-04 MED ORDER — NITROGLYCERIN 0.4 MG SL SUBL
0.4000 mg | SUBLINGUAL_TABLET | SUBLINGUAL | 3 refills | Status: DC | PRN
Start: 2020-12-04 — End: 2022-12-03

## 2020-12-04 NOTE — Addendum Note (Signed)
Addended by: Anibal Henderson on: 12/04/2020 02:47 PM   Modules accepted: Orders

## 2020-12-04 NOTE — Progress Notes (Signed)
Metrics: Intervention Frequency ACO  Documented Smoking Status Yearly  Screened one or more times in 24 months  Cessation Counseling or  Active cessation medication Past 24 months  Past 24 months   Guideline developer: UpToDate (See UpToDate for funding source) Date Released: 2014       Wellness Office Visit  Subjective:  Patient ID: Ronald Mcdowell, male    DOB: 23-Feb-1946  Age: 75 y.o. MRN: [id number]  CC: This man was scheduled for an annual physical but due to the time constraints, I decided to convert this into an office visit.  I will address his chronic conditions which include hypertension, vitamin D deficiency, coronary artery disease and recent therapy for Graves' disease. HPI  He was treated with radioactive iodine and missed an appointment with the endocrinologist.  However, blood work that was done about a month ago still showed that he was hyperthyroid.  He has been gaining weight.  He feels reasonably well. He continues to take lisinopril, amlodipine and bisoprolol for hypertension and also is on statin therapy in view of his coronary artery disease. He would like prescription for sublingual nitroglycerin. Past Medical History:  Diagnosis Date  . Anemia   . Atrial fibrillation ([place])   . Coronary artery disease    a. h/o MI in 2000 w/ prior LCX stenting;  b. 7.2014 Abnl Cardiolite, EF 41%, mod-large inferolat scar;  c. 07/2013 Cath/PCI: LM nl, LAD 10-20, D1 40-50p, LCX 95-99 @ distal stent margin (2.5x20 Promus Premier DES), RCA dom, 40p, 12m, 70d, EF 35-40%.  . Elevated cholesterol   . GERD (gastroesophageal reflux disease)   . Graves disease 04/2020   s/p I-131 thyroid ablation   . Hemorrhoids   . Hypertension   . Hyperthyroidism 09/27/2020  . Ischemic cardiomyopathy    a. 07/2013 EF 35-40% by LV gram.; EF 50% on ECHO in November 2020  . Myocardial infarction ([place])   . Reflux esophagitis   . Spondylosis of cervical joint    C3-4, C6-7  . Tobacco user   . Vitamin D  deficiency disease 08/24/2019   Past Surgical History:  Procedure Laterality Date  . BIOPSY  01/09/2018   Procedure: BIOPSY;  Surgeon: Daneil Dolin, MD;  [facility];  Service: Endoscopy;;  esophageal biopsies  . CARDIAC CATHETERIZATION    . COLONOSCOPY    . COLONOSCOPY N/A 01/25/2016   RMR: normal ileocolonoscopy ( anal canal hemorrhoids)  . CORONARY ANGIOPLASTY WITH STENT PLACEMENT  08/23/2013   mid circumflex  DES    by Dr Martinique  . CORONARY STENT PLACEMENT  2000  . ESOPHAGOGASTRODUODENOSCOPY N/A 01/25/2016   RMR: Reflux esophagitits. Cervical web with passage of the scope. Status post esophageal biosy. Hiatal hernia.   [name] ESOPHAGOGASTRODUODENOSCOPY N/A 01/09/2018   Rourk: cervical/proximal esophageal web s/p dilation, markedly abnormal esophagus c/w biopsy proven eosinophilic esophagitis. small hiatal hernia.  [name] ESOPHAGOGASTRODUODENOSCOPY N/A 05/31/2020   Procedure: ESOPHAGOGASTRODUODENOSCOPY (EGD);  Surgeon: Daneil Dolin, MD;  [facility];  Service: Endoscopy;  Laterality: N/A;  8:30am  . LEFT HEART CATHETERIZATION WITH CORONARY ANGIOGRAM N/A 08/23/2013   Procedure: LEFT HEART CATHETERIZATION WITH CORONARY ANGIOGRAM;  Surgeon: Peter M Martinique, MD;  Location: [facility] CATH LAB;  Service: Cardiovascular;  Laterality: N/A;  . [name] DILATION N/A 01/09/2018   Procedure: [name] DILATION;  Surgeon: Daneil Dolin, MD;  [facility];  Service: Endoscopy;  Laterality: N/A;  . MALONEY DILATION N/A 05/31/2020   Procedure: [name] DILATION;  Surgeon: Gala Romney,  Gerrit Friends, MD;  [facility];  Service: Endoscopy;  Laterality: N/A;  . TRANSURETHRAL RESECTION OF PROSTATE  01/09/2012   Procedure: TRANSURETHRAL RESECTION OF THE PROSTATE (TURP);  Surgeon: Ky Barban, MD;  Location: [facility];  Service: Urology;  Laterality: N/A;     Family History  Problem Relation Age of Onset  . Early death Mother        childbirth  . Heart attack Father   . Cancer Father   .  Heart disease Brother        CABG  . Heart disease Brother        slight heart attack  . Anesthesia problems Neg Hx   . Hypotension Neg Hx   . Malignant hyperthermia Neg Hx   . Pseudochol deficiency Neg Hx   . Colon cancer Neg Hx   . Liver disease Neg Hx   . Gastric cancer Neg Hx   . Esophageal cancer Neg Hx     Social History   Social History Narrative   Pt gets regular exercise.Married for 22 years.Retired but still Engineering geologist.   Social History   Tobacco Use  . Smoking status: Current Every Day Smoker    Packs/day: 1.00    Years: 53.00    Pack years: 53.00    Types: Cigarettes    Start date: 12/09/1965  . Smokeless tobacco: Never Used  . Tobacco comment: 3/4 pack of cigarettes daily; smoked since 75 years old  Substance Use Topics  . Alcohol use: No    Alcohol/week: 0.0 standard drinks    Comment: No bowel since year 2000.  Some previous heavy use.    Current Meds  Medication Sig  . amLODipine (NORVASC) 5 MG tablet TAKE 1 TABLET BY MOUTH  DAILY (Patient taking differently: Take 5 mg by mouth daily.)  . apixaban (ELIQUIS) 5 MG TABS tablet TAKE 1 TABLET(5 MG) BY MOUTH TWICE DAILY  . atorvastatin (LIPITOR) 40 MG tablet Take 1 tablet (40 mg total) by mouth at bedtime.  . bisoprolol (ZEBETA) 5 MG tablet TAKE 1 TABLET BY MOUTH  DAILY  . Cholecalciferol (VITAMIN D-3) 125 MCG (5000 UT) TABS Take 1 tablet by mouth daily.  [name] ketoconazole (NIZORAL) 2 % cream SMARTSIG:1 Sparingly Topical Twice Daily PRN  . lisinopril (ZESTRIL) 20 MG tablet TAKE 1 TABLET BY MOUTH  DAILY  . nitroGLYCERIN (NITROSTAT) 0.4 MG SL tablet Place 1 tablet (0.4 mg total) under the tongue every 5 (five) minutes as needed for chest pain.  . nitroGLYCERIN (NITROSTAT) 0.4 MG SL tablet Place 1 tablet (0.4 mg total) under the tongue every 5 (five) minutes as needed for chest pain.  . pantoprazole (PROTONIX) 40 MG tablet TAKE 1 TABLET BY MOUTH  DAILY  . potassium chloride SA (KLOR-CON) 20  MEQ tablet Take 20 mEq by mouth daily.      Depression screen [facility] 2/9 12/04/2020 03/09/2020 11/04/2017  Decreased Interest 0 0 0  Down, Depressed, Hopeless 0 0 0  PHQ - 2 Score 0 0 0  Altered sleeping 0 - -  Tired, decreased energy 0 - -  Change in appetite 0 - -  Feeling bad or failure about yourself  0 - -  Trouble concentrating 0 - -  Moving slowly or fidgety/restless 0 - -  Suicidal thoughts 0 - -  PHQ-9 Score 0 - -  Difficult doing work/chores Not difficult at all - -     Objective:   Today's Vitals: BP 136/72  Pulse 68   Temp (!) 97.1 F (36.2 C) (Temporal)   Ht 5\' 4"  (1.626 m) Comment: Patient had shoes on  Wt 142 lb 3.2 oz (64.5 kg)   SpO2 97%   BMI 24.41 kg/m  Vitals with BMI 12/04/2020 09/27/2020 08/11/2020  Height 5\' 4"  5\' 5"  5\' 5"   Weight 142 lbs 3 oz 138 lbs 137 lbs  BMI 24.4 50.93 26.7  Systolic 124 - 580  Diastolic 72 - 70  Pulse 68 - 66     Physical Exam Looks systemically well.  He is gaining weight.  Blood pressure is acceptable/normal.      Assessment   1. Vitamin D deficiency disease   2. Essential hypertension   3. Graves disease   4. Coronary artery disease of native artery of native heart with stable angina pectoris ([place])       Tests ordered Orders Placed This Encounter  Procedures  . COMPLETE METABOLIC PANEL WITH GFR  . VITAMIN D 25 Hydroxy (Vit-D Deficiency, Fractures)  . T3, free  . T4, free  . TSH     Plan: 1. He will continue with all antihypertensive medications listed above. 2. We will check thyroid function again in case he has become hypothyroid in which case he would warrant treatment. 3. I have given a prescription for nitroglycerin tablets which he clearly needs to keep current at all times in his pocket every day.  Thankfully, he has not had any cardiac symptoms for more than a decade now. 4. Follow-up as scheduled with Judson Roch in April and further recommendations will depend on blood results.   Meds ordered  this encounter  Medications  . nitroGLYCERIN (NITROSTAT) 0.4 MG SL tablet    Sig: Place 1 tablet (0.4 mg total) under the tongue every 5 (five) minutes as needed for chest pain.    Dispense:  30 tablet    Refill:  3    Idali Lafever Luther Parody, MD

## 2020-12-05 LAB — COMPLETE METABOLIC PANEL WITH GFR
AG Ratio: 1.1 (calc) (ref 1.0–2.5)
ALT: 26 U/L (ref 9–46)
AST: 25 U/L (ref 10–35)
Albumin: 3.9 g/dL (ref 3.6–5.1)
Alkaline phosphatase (APISO): 54 U/L (ref 35–144)
BUN/Creatinine Ratio: 13 (calc) (ref 6–22)
BUN: 19 mg/dL (ref 7–25)
CO2: 29 mmol/L (ref 20–32)
Calcium: 8.9 mg/dL (ref 8.6–10.3)
Chloride: 106 mmol/L (ref 98–110)
Creat: 1.52 mg/dL — ABNORMAL HIGH (ref 0.70–1.18)
GFR, Est African American: 52 mL/min/{1.73_m2} — ABNORMAL LOW (ref >=60)
GFR, Est Non African American: 45 mL/min/{1.73_m2} — ABNORMAL LOW (ref >=60)
Globulin: 3.5 g/dL (calc) (ref 1.9–3.7)
Glucose, Bld: 92 mg/dL (ref 65–139)
Potassium: 3.5 mmol/L (ref 3.5–5.3)
Sodium: 142 mmol/L (ref 135–146)
Total Bilirubin: 0.8 mg/dL (ref 0.2–1.2)
Total Protein: 7.4 g/dL (ref 6.1–8.1)

## 2020-12-05 LAB — T3, FREE: T3, Free: 4.5 pg/mL — ABNORMAL HIGH (ref 2.3–4.2)

## 2020-12-05 LAB — VITAMIN D 25 HYDROXY (VIT D DEFICIENCY, FRACTURES): Vit D, 25-Hydroxy: 80 ng/mL (ref 30–100)

## 2020-12-05 LAB — TSH: TSH: 0.01 mIU/L — ABNORMAL LOW (ref 0.40–4.50)

## 2020-12-05 LAB — T4, FREE: Free T4: 1.6 ng/dL (ref 0.8–1.8)

## 2020-12-05 NOTE — Progress Notes (Signed)
Please call this patient.  Tell him his kidney function is worse and needs to be drinking a whole lot more water than he is right now probably.  His vitamin D levels are in a very good range to continue with the same dose of vitamin D3.  His thyroid test still show that he is not underactive despite treatment so we will hold off on putting him on any treatment for thyroid..  Follow-up as scheduled.

## 2020-12-05 NOTE — Progress Notes (Signed)
Pt was called and given lab result of testing. Pt was encouraged to drink more water and continue with he Vitamin D3 as direct. No changes in other  medications. Pt will work on drinking more daily. 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

## 2020-12-13 NOTE — Progress Notes (Signed)
Pt was called and given the instructions per lab results & Dr Anastasio Champion request. Pt stated he be at work he dont drink a lot; and he try to do more on intake for the water.

## 2020-12-28 ENCOUNTER — Other Ambulatory Visit: Payer: Self-pay

## 2020-12-28 ENCOUNTER — Ambulatory Visit (INDEPENDENT_AMBULATORY_CARE_PROVIDER_SITE_OTHER): Payer: Medicare HMO | Admitting: Internal Medicine

## 2020-12-28 ENCOUNTER — Encounter (INDEPENDENT_AMBULATORY_CARE_PROVIDER_SITE_OTHER): Payer: Self-pay | Admitting: Internal Medicine

## 2020-12-28 VITALS — BP 130/80 | HR 65 | Temp 97.6°F | Resp 18 | Ht 64.0 in | Wt 144.0 lb

## 2020-12-28 DIAGNOSIS — A64 Unspecified sexually transmitted disease: Secondary | ICD-10-CM | POA: Diagnosis not present

## 2020-12-28 NOTE — Progress Notes (Signed)
Metrics: Intervention Frequency ACO  Documented Smoking Status Yearly  Screened one or more times in 24 months  Cessation Counseling or  Active cessation medication Past 24 months  Past 24 months   Guideline developer: UpToDate (See UpToDate for funding source) Date Released: 2014       Wellness Office Visit  Subjective:  Patient ID: Ronald Mcdowell, male    DOB: Oct 31, 1946  Age: 75 y.o. MRN: [id number]  CC: Concern over sexually transmitted disease. HPI  This patient comes in requesting to be tested for sexually transmitted disease.  Apparently his wife was found by her physician to have some sort of sexually transmitted disease.  The patient could not tell me what organism this was.  The patient has no symptoms.  He tells me that the last time he had sex was with his wife in October 2021. Past Medical History:  Diagnosis Date  . Anemia   . Atrial fibrillation ([place])   . Coronary artery disease    a. h/o MI in 2000 w/ prior LCX stenting;  b. 7.2014 Abnl Cardiolite, EF 41%, mod-large inferolat scar;  c. 07/2013 Cath/PCI: LM nl, LAD 10-20, D1 40-50p, LCX 95-99 @ distal stent margin (2.5x20 Promus Premier DES), RCA dom, 40p, 46m, 70d, EF 35-40%.  . Elevated cholesterol   . GERD (gastroesophageal reflux disease)   . Graves disease 04/2020   s/p I-131 thyroid ablation   . Hemorrhoids   . Hypertension   . Hyperthyroidism 09/27/2020  . Ischemic cardiomyopathy    a. 07/2013 EF 35-40% by LV gram.; EF 50% on ECHO in November 2020  . Myocardial infarction ([place])   . Reflux esophagitis   . Spondylosis of cervical joint    C3-4, C6-7  . Tobacco user   . Vitamin D deficiency disease 08/24/2019   Past Surgical History:  Procedure Laterality Date  . BIOPSY  01/09/2018   Procedure: BIOPSY;  Surgeon: Daneil Dolin, MD;  [facility];  Service: Endoscopy;;  esophageal biopsies  . CARDIAC CATHETERIZATION    . COLONOSCOPY    . COLONOSCOPY N/A 01/25/2016   RMR: normal ileocolonoscopy (  anal canal hemorrhoids)  . CORONARY ANGIOPLASTY WITH STENT PLACEMENT  08/23/2013   mid circumflex  DES    by Dr Martinique  . CORONARY STENT PLACEMENT  2000  . ESOPHAGOGASTRODUODENOSCOPY N/A 01/25/2016   RMR: Reflux esophagitits. Cervical web with passage of the scope. Status post esophageal biosy. Hiatal hernia.   [name] ESOPHAGOGASTRODUODENOSCOPY N/A 01/09/2018   Rourk: cervical/proximal esophageal web s/p dilation, markedly abnormal esophagus c/w biopsy proven eosinophilic esophagitis. small hiatal hernia.  [name] ESOPHAGOGASTRODUODENOSCOPY N/A 05/31/2020   Procedure: ESOPHAGOGASTRODUODENOSCOPY (EGD);  Surgeon: Daneil Dolin, MD;  [facility];  Service: Endoscopy;  Laterality: N/A;  8:30am  . LEFT HEART CATHETERIZATION WITH CORONARY ANGIOGRAM N/A 08/23/2013   Procedure: LEFT HEART CATHETERIZATION WITH CORONARY ANGIOGRAM;  Surgeon: Peter M Martinique, MD;  Location: [facility] CATH LAB;  Service: Cardiovascular;  Laterality: N/A;  . [name] DILATION N/A 01/09/2018   Procedure: [name] DILATION;  Surgeon: Daneil Dolin, MD;  [facility];  Service: Endoscopy;  Laterality: N/A;  . [name] DILATION N/A 05/31/2020   Procedure: [name] DILATION;  Surgeon: Daneil Dolin, MD;  [facility];  Service: Endoscopy;  Laterality: N/A;  . TRANSURETHRAL RESECTION OF PROSTATE  01/09/2012   Procedure: TRANSURETHRAL RESECTION OF THE PROSTATE (TURP);  Surgeon: Marissa Nestle, MD;  Location: [facility];  Service: Urology;  Laterality: N/A;  Family History  Problem Relation Age of Onset  . Early death Mother        childbirth  . Heart attack Father   . Cancer Father   . Heart disease Brother        CABG  . Heart disease Brother        slight heart attack  . Anesthesia problems Neg Hx   . Hypotension Neg Hx   . Malignant hyperthermia Neg Hx   . Pseudochol deficiency Neg Hx   . Colon cancer Neg Hx   . Liver disease Neg Hx   . Gastric cancer Neg Hx   . Esophageal cancer Neg Hx     Social  History   Social History Narrative   Pt gets regular exercise.Married for 22 years.Retired but still Dentist.   Social History   Tobacco Use  . Smoking status: Current Every Day Smoker    Packs/day: 1.00    Years: 53.00    Pack years: 53.00    Types: Cigarettes    Start date: 12/09/1965  . Smokeless tobacco: Never Used  . Tobacco comment: 3/4 pack of cigarettes daily; smoked since 75 years old  Substance Use Topics  . Alcohol use: No    Alcohol/week: 0.0 standard drinks    Comment: No bowel since year 2000.  Some previous heavy use.    Current Meds  Medication Sig  . amLODipine (NORVASC) 5 MG tablet TAKE 1 TABLET BY MOUTH  DAILY (Patient taking differently: Take 5 mg by mouth daily.)  . apixaban (ELIQUIS) 5 MG TABS tablet TAKE 1 TABLET(5 MG) BY MOUTH TWICE DAILY  . bisoprolol (ZEBETA) 5 MG tablet TAKE 1 TABLET BY MOUTH  DAILY  . Cholecalciferol (VITAMIN D-3) 125 MCG (5000 UT) TABS Take 1 tablet by mouth daily.  [name] ketoconazole (NIZORAL) 2 % cream SMARTSIG:1 Sparingly Topical Twice Daily PRN  . lisinopril (ZESTRIL) 20 MG tablet TAKE 1 TABLET BY MOUTH  DAILY  . nitroGLYCERIN (NITROSTAT) 0.4 MG SL tablet Place 1 tablet (0.4 mg total) under the tongue every 5 (five) minutes as needed for chest pain.  . nitroGLYCERIN (NITROSTAT) 0.4 MG SL tablet Place 1 tablet (0.4 mg total) under the tongue every 5 (five) minutes as needed for chest pain.  . pantoprazole (PROTONIX) 40 MG tablet TAKE 1 TABLET BY MOUTH  DAILY  . potassium chloride SA (KLOR-CON) 20 MEQ tablet Take 20 mEq by mouth daily.      Depression screen [facility] 2/9 12/04/2020 03/09/2020 11/04/2017  Decreased Interest 0 0 0  Down, Depressed, Hopeless 0 0 0  PHQ - 2 Score 0 0 0  Altered sleeping 0 - -  Tired, decreased energy 0 - -  Change in appetite 0 - -  Feeling bad or failure about yourself  0 - -  Trouble concentrating 0 - -  Moving slowly or fidgety/restless 0 - -  Suicidal thoughts 0 - -   PHQ-9 Score 0 - -  Difficult doing work/chores Not difficult at all - -     Objective:   Today's Vitals: BP 130/80 (BP Location: Right Arm, Patient Position: Sitting, Cuff Size: Normal)   Pulse 65   Temp 97.6 F (36.4 C)   Resp 18   Ht 5\' 4"  (1.626 m)   Wt 144 lb (65.3 kg)   SpO2 96%   BMI 24.72 kg/m  Vitals with BMI 12/28/2020 12/04/2020 09/27/2020  Height 5\' 4"  5\' 4"  5\' 5"   Weight 144 lbs 142 lbs 3  oz 138 lbs  BMI 24.71 [id number]  Systolic [id number] -  Diastolic 80 72 -  Pulse 65 68 -     Physical Exam  He looks systemically well.  No new physical findings.     Assessment   1. Sexually transmitted disease       Tests ordered Orders Placed This Encounter  Procedures  . Chlamydia/Neisseria Gonorrhoeae RNA,TMA,Urogenital  . HIV Antibody (routine testing w rflx)  . Hepatitis C antibody     Plan: 1. We will check the urine for chlamydia and gonorrhea and check the blood for HIV and hepatitis C.  Further recommendations will depend on the these results.   No orders of the defined types were placed in this encounter.   Doree Albee, MD

## 2020-12-31 LAB — HEPATITIS C ANTIBODY
Hepatitis C Ab: NONREACTIVE
SIGNAL TO CUT-OFF: 0.14 (ref <1.00)

## 2020-12-31 LAB — CHLAMYDIA/NEISSERIA GONORRHOEAE RNA,TMA,UROGENTIAL
C. trachomatis RNA, TMA: NOT DETECTED
N. gonorrhoeae RNA, TMA: NOT DETECTED

## 2020-12-31 LAB — HIV ANTIBODY (ROUTINE TESTING W REFLEX): HIV 1&2 Ab, 4th Generation: NONREACTIVE

## 2021-01-01 NOTE — Progress Notes (Signed)
Please call this patient and let him know that the test we did are all negative for sexually transmitted disease.

## 2021-01-02 ENCOUNTER — Other Ambulatory Visit (INDEPENDENT_AMBULATORY_CARE_PROVIDER_SITE_OTHER): Payer: Self-pay | Admitting: Internal Medicine

## 2021-01-02 DIAGNOSIS — E785 Hyperlipidemia, unspecified: Secondary | ICD-10-CM

## 2021-01-02 DIAGNOSIS — K2100 Gastro-esophageal reflux disease with esophagitis, without bleeding: Secondary | ICD-10-CM

## 2021-01-02 DIAGNOSIS — I10 Essential (primary) hypertension: Secondary | ICD-10-CM

## 2021-01-02 MED ORDER — LISINOPRIL 20 MG PO TABS: 20.0000 mg | ORAL_TABLET | Freq: Every day | ORAL | 1 refills | Status: DC

## 2021-01-02 MED ORDER — BISOPROLOL FUMARATE 5 MG PO TABS: 5.0000 mg | ORAL_TABLET | Freq: Every day | ORAL | 1 refills | Status: DC

## 2021-01-02 MED ORDER — POTASSIUM CHLORIDE CRYS ER 20 MEQ PO TBCR: 20.0000 meq | EXTENDED_RELEASE_TABLET | Freq: Every day | ORAL | 1 refills | Status: DC

## 2021-01-02 MED ORDER — APIXABAN 5 MG PO TABS: ORAL_TABLET | ORAL | 2 refills | Status: DC

## 2021-01-08 ENCOUNTER — Other Ambulatory Visit (INDEPENDENT_AMBULATORY_CARE_PROVIDER_SITE_OTHER): Payer: Self-pay | Admitting: Internal Medicine

## 2021-01-08 ENCOUNTER — Telehealth (INDEPENDENT_AMBULATORY_CARE_PROVIDER_SITE_OTHER): Payer: Self-pay | Admitting: Internal Medicine

## 2021-01-08 DIAGNOSIS — K2100 Gastro-esophageal reflux disease with esophagitis, without bleeding: Secondary | ICD-10-CM

## 2021-01-08 DIAGNOSIS — I10 Essential (primary) hypertension: Secondary | ICD-10-CM

## 2021-01-08 DIAGNOSIS — E785 Hyperlipidemia, unspecified: Secondary | ICD-10-CM

## 2021-01-08 MED ORDER — ATORVASTATIN CALCIUM 40 MG PO TABS: 40.0000 mg | ORAL_TABLET | Freq: Every day | ORAL | 1 refills | Status: DC

## 2021-01-08 MED ORDER — PANTOPRAZOLE SODIUM 40 MG PO TBEC: 40.0000 mg | DELAYED_RELEASE_TABLET | Freq: Every day | ORAL | 0 refills | Status: DC

## 2021-01-08 MED ORDER — AMLODIPINE BESYLATE 5 MG PO TABS: 5.0000 mg | ORAL_TABLET | Freq: Every day | ORAL | 1 refills | Status: DC

## 2021-01-08 NOTE — Telephone Encounter (Signed)
Done

## 2021-01-11 ENCOUNTER — Other Ambulatory Visit (INDEPENDENT_AMBULATORY_CARE_PROVIDER_SITE_OTHER): Payer: Self-pay | Admitting: Internal Medicine

## 2021-01-11 DIAGNOSIS — I10 Essential (primary) hypertension: Secondary | ICD-10-CM

## 2021-01-11 DIAGNOSIS — E785 Hyperlipidemia, unspecified: Secondary | ICD-10-CM

## 2021-01-11 DIAGNOSIS — K2100 Gastro-esophageal reflux disease with esophagitis, without bleeding: Secondary | ICD-10-CM

## 2021-01-11 MED ORDER — PANTOPRAZOLE SODIUM 40 MG PO TBEC: 40.0000 mg | DELAYED_RELEASE_TABLET | Freq: Every day | ORAL | 0 refills | Status: DC

## 2021-01-11 MED ORDER — ATORVASTATIN CALCIUM 40 MG PO TABS
40.0000 mg | ORAL_TABLET | Freq: Every day | ORAL | 1 refills | Status: DC
Start: 2021-01-11 — End: 2021-02-02

## 2021-01-11 MED ORDER — AMLODIPINE BESYLATE 5 MG PO TABS: 5.0000 mg | ORAL_TABLET | Freq: Every day | ORAL | 1 refills | Status: DC

## 2021-01-17 NOTE — Progress Notes (Signed)
Note was left in front desk w/ Ronna Polio Staff CMA/LPN call patient and notified. Just did not know where to document.

## 2021-01-29 ENCOUNTER — Other Ambulatory Visit: Payer: Self-pay

## 2021-01-29 ENCOUNTER — Ambulatory Visit (INDEPENDENT_AMBULATORY_CARE_PROVIDER_SITE_OTHER): Payer: Medicare HMO | Admitting: Cardiology

## 2021-01-29 ENCOUNTER — Encounter: Payer: Self-pay | Admitting: Cardiology

## 2021-01-29 VITALS — BP 158/84 | HR 60 | Ht 65.0 in | Wt 143.8 lb

## 2021-01-29 DIAGNOSIS — I4891 Unspecified atrial fibrillation: Secondary | ICD-10-CM | POA: Diagnosis not present

## 2021-01-29 DIAGNOSIS — I10 Essential (primary) hypertension: Secondary | ICD-10-CM | POA: Diagnosis not present

## 2021-01-29 DIAGNOSIS — E782 Mixed hyperlipidemia: Secondary | ICD-10-CM

## 2021-01-29 DIAGNOSIS — I2510 Atherosclerotic heart disease of native coronary artery without angina pectoris: Secondary | ICD-10-CM | POA: Diagnosis not present

## 2021-01-29 NOTE — Progress Notes (Signed)
Clinical Summary Ronald Mcdowell is a 75 y.o.male former patient of Dr Bronson Ing, this is our first visit together. Seen for the following medical problems.   1. CAD - Sept 2014 stenting to LCX, medically managed RCA disease but could consider intervention if neccesary - no recent chest pain. No SOB/DOE - compliant with meds  2. Afib - no recent palpitations - no bleeding on eliquis.   3. HTN - compliatn with meds - last pcp visits 130s/70s   4. Hyperlipidemia - compliant with meds      Past Medical History:  Diagnosis Date  . Anemia   . Atrial fibrillation ([place])   . Coronary artery disease    a. h/o MI in 2000 w/ prior LCX stenting;  b. 7.2014 Abnl Cardiolite, EF 41%, mod-large inferolat scar;  c. 07/2013 Cath/PCI: LM nl, LAD 10-20, D1 40-50p, LCX 95-99 @ distal stent margin (2.5x20 Promus Premier DES), RCA dom, 40p, 42m, 70d, EF 35-40%.  . Elevated cholesterol   . GERD (gastroesophageal reflux disease)   . Graves disease 04/2020   s/p I-131 thyroid ablation   . Hemorrhoids   . Hypertension   . Hyperthyroidism 09/27/2020  . Ischemic cardiomyopathy    a. 07/2013 EF 35-40% by LV gram.; EF 50% on ECHO in November 2020  . Myocardial infarction ([place])   . Reflux esophagitis   . Spondylosis of cervical joint    C3-4, C6-7  . Tobacco user   . Vitamin D deficiency disease 08/24/2019     No Known Allergies   Current Outpatient Medications  Medication Sig Dispense Refill  . amLODipine (NORVASC) 5 MG tablet Take 1 tablet (5 mg total) by mouth daily. 90 tablet 1  . apixaban (ELIQUIS) 5 MG TABS tablet TAKE 1 TABLET(5 MG) BY MOUTH TWICE DAILY 180 tablet 2  . atorvastatin (LIPITOR) 40 MG tablet Take 1 tablet (40 mg total) by mouth at bedtime. 90 tablet 1  . bisoprolol (ZEBETA) 5 MG tablet Take 1 tablet (5 mg total) by mouth daily. 90 tablet 1  . Cholecalciferol (VITAMIN D-3) 125 MCG (5000 UT) TABS Take 1 tablet by mouth daily.    [name] ketoconazole (NIZORAL) 2 % cream SMARTSIG:1  Sparingly Topical Twice Daily PRN    . lisinopril (ZESTRIL) 20 MG tablet Take 1 tablet (20 mg total) by mouth daily. 90 tablet 1  . nitroGLYCERIN (NITROSTAT) 0.4 MG SL tablet Place 1 tablet (0.4 mg total) under the tongue every 5 (five) minutes as needed for chest pain. 30 tablet 0  . nitroGLYCERIN (NITROSTAT) 0.4 MG SL tablet Place 1 tablet (0.4 mg total) under the tongue every 5 (five) minutes as needed for chest pain. 30 tablet 3  . pantoprazole (PROTONIX) 40 MG tablet Take 1 tablet (40 mg total) by mouth daily. 90 tablet 0  . potassium chloride SA (KLOR-CON) 20 MEQ tablet Take 1 tablet (20 mEq total) by mouth daily. 90 tablet 1   No current facility-administered medications for this visit.     Past Surgical History:  Procedure Laterality Date  . BIOPSY  01/09/2018   Procedure: BIOPSY;  Surgeon: Daneil Dolin, MD;  [facility];  Service: Endoscopy;;  esophageal biopsies  . CARDIAC CATHETERIZATION    . COLONOSCOPY    . COLONOSCOPY N/A 01/25/2016   RMR: normal ileocolonoscopy ( anal canal hemorrhoids)  . CORONARY ANGIOPLASTY WITH STENT PLACEMENT  08/23/2013   mid circumflex  DES    by Dr Martinique  . CORONARY STENT PLACEMENT  2000  . ESOPHAGOGASTRODUODENOSCOPY N/A 01/25/2016   RMR: Reflux esophagitits. Cervical web with passage of the scope. Status post esophageal biosy. Hiatal hernia.   [name] ESOPHAGOGASTRODUODENOSCOPY N/A 01/09/2018   Rourk: cervical/proximal esophageal web s/p dilation, markedly abnormal esophagus c/w biopsy proven eosinophilic esophagitis. small hiatal hernia.  [name] ESOPHAGOGASTRODUODENOSCOPY N/A 05/31/2020   Procedure: ESOPHAGOGASTRODUODENOSCOPY (EGD);  Surgeon: Daneil Dolin, MD;  [facility];  Service: Endoscopy;  Laterality: N/A;  8:30am  . LEFT HEART CATHETERIZATION WITH CORONARY ANGIOGRAM N/A 08/23/2013   Procedure: LEFT HEART CATHETERIZATION WITH CORONARY ANGIOGRAM;  Surgeon: Peter M Martinique, MD;  Location: [facility] CATH LAB;  Service: Cardiovascular;   Laterality: N/A;  . [name] DILATION N/A 01/09/2018   Procedure: [name] DILATION;  Surgeon: Daneil Dolin, MD;  [facility];  Service: Endoscopy;  Laterality: N/A;  . [name] DILATION N/A 05/31/2020   Procedure: [name] DILATION;  Surgeon: Daneil Dolin, MD;  [facility];  Service: Endoscopy;  Laterality: N/A;  . TRANSURETHRAL RESECTION OF PROSTATE  01/09/2012   Procedure: TRANSURETHRAL RESECTION OF THE PROSTATE (TURP);  Surgeon: Marissa Nestle, MD;  Location: [facility];  Service: Urology;  Laterality: N/A;     No Known Allergies    Family History  Problem Relation Age of Onset  . Early death Mother        childbirth  . Heart attack Father   . Cancer Father   . Heart disease Brother        CABG  . Heart disease Brother        slight heart attack  . Anesthesia problems Neg Hx   . Hypotension Neg Hx   . Malignant hyperthermia Neg Hx   . Pseudochol deficiency Neg Hx   . Colon cancer Neg Hx   . Liver disease Neg Hx   . Gastric cancer Neg Hx   . Esophageal cancer Neg Hx      Social History Ronald Mcdowell reports that he has been smoking cigarettes. He started smoking about 55 years ago. He has a 53.00 pack-year smoking history. He has never used smokeless tobacco. Ronald Mcdowell reports no history of alcohol use.   Review of Systems CONSTITUTIONAL: No weight loss, fever, chills, weakness or fatigue.  HEENT: Eyes: No visual loss, blurred vision, double vision or yellow sclerae.No hearing loss, sneezing, congestion, runny nose or sore throat.  SKIN: No rash or itching.  CARDIOVASCULAR: per hpi RESPIRATORY: No shortness of breath, cough or sputum.  GASTROINTESTINAL: No anorexia, nausea, vomiting or diarrhea. No abdominal pain or blood.  GENITOURINARY: No burning on urination, no polyuria NEUROLOGICAL: No headache, dizziness, syncope, paralysis, ataxia, numbness or tingling in the extremities. No change in bowel or bladder control.  MUSCULOSKELETAL: No muscle,  back pain, joint pain or stiffness.  LYMPHATICS: No enlarged nodes. No history of splenectomy.  PSYCHIATRIC: No history of depression or anxiety.  ENDOCRINOLOGIC: No reports of sweating, cold or heat intolerance. No polyuria or polydipsia.  [name]   Physical Examination Today's Vitals   01/29/21 1542  BP: (!) 158/84  Pulse: 60  SpO2: 98%  Weight: 143 lb 12.8 oz (65.2 kg)  Height: 5\' 5"  (1.651 m)   Body mass index is 23.93 kg/m.  Gen: resting comfortably, no acute distress HEENT: no scleral icterus, pupils equal round and reactive, no palptable cervical adenopathy,  CV: RRR, no mr/g no jvd Resp: Clear to auscultation bilaterally GI: abdomen is soft, non-tender, non-distended, normal bowel sounds, no hepatosplenomegaly MSK: extremities are warm,  no edema.  Skin: warm, no rash Neuro:  no focal deficits Psych: appropriate affect   Diagnostic Studies  Echocardiogram 10/20/2019:  1. Left ventricular ejection fraction, by visual estimation, is 50%. The left ventricle has low normal function. There is mildly increased left ventricular hypertrophy. 2. Basal and mid inferolateral wall, basal inferior segment, mid anterolateral segment, and mid inferior segment are abnormal. 3. Elevated left ventricular end-diastolic pressure. 4. Left ventricular diastolic parameters are consistent with Grade I diastolic dysfunction (impaired relaxation). 5. Global right ventricle has normal systolic function.The right ventricular size is normal. No increase in right ventricular wall thickness. 6. Left atrial size was severely dilated. 7. Right atrial size was normal. 8. Mild mitral annular calcification. 9. Moderate aortic valve annular calcification. 10. The mitral valve is degenerative. Mild mitral valve regurgitation. 11. The tricuspid valve is grossly normal. Tricuspid valve regurgitation is not demonstrated. 12. The aortic valve is tricuspid. Aortic valve regurgitation is not visualized.  Mild aortic valve sclerosis without stenosis. 13. There is Mild calcification of the aortic valve. 14. There is Mild thickening of the aortic valve. 15. The pulmonic valve was grossly normal. Pulmonic valve regurgitation is trivial. 16. Normal pulmonary artery systolic pressure. 17. The inferior vena cava is normal in size with greater than 50% respiratory variability, suggesting right atrial pressure of 3 mmHg.   Assessment and Plan  1. CAD - no symptoms, continue current meds  2. HTN - elevated today, last 2 visits with pcp was at goal - continue to monintor at this time  3. Hyperlipidemia - repeat labs, continue statin   F/u 6 months      Arnoldo Lenis, M.D.

## 2021-01-29 NOTE — Patient Instructions (Signed)
Your physician recommends that you schedule a follow-up appointment in: [place]  Your physician recommends that you continue on your current medications as directed. Please refer to the Current Medication list given to you today.  Your physician recommends that you return for lab work LIPIDS - PLEASE FAST 6-8 [place]   Thank you for choosing [facility]!!

## 2021-01-30 ENCOUNTER — Other Ambulatory Visit: Payer: Self-pay | Admitting: Cardiology

## 2021-01-30 DIAGNOSIS — E782 Mixed hyperlipidemia: Secondary | ICD-10-CM | POA: Diagnosis not present

## 2021-01-31 LAB — LIPID PANEL
Cholesterol: 136 mg/dL (ref <200)
HDL: 34 mg/dL — ABNORMAL LOW (ref >=40)
LDL Cholesterol (Calc): 87 mg/dL (calc)
Non-HDL Cholesterol (Calc): 102 mg/dL (calc) (ref <130)
Total CHOL/HDL Ratio: 4 (calc) (ref <5.0)
Triglycerides: 66 mg/dL (ref <150)

## 2021-02-02 ENCOUNTER — Telehealth: Payer: Self-pay

## 2021-02-02 DIAGNOSIS — I4891 Unspecified atrial fibrillation: Secondary | ICD-10-CM | POA: Diagnosis not present

## 2021-02-02 DIAGNOSIS — K2100 Gastro-esophageal reflux disease with esophagitis, without bleeding: Secondary | ICD-10-CM | POA: Diagnosis not present

## 2021-02-02 DIAGNOSIS — I255 Ischemic cardiomyopathy: Secondary | ICD-10-CM | POA: Diagnosis not present

## 2021-02-02 DIAGNOSIS — S838X1A Sprain of other specified parts of right knee, initial encounter: Secondary | ICD-10-CM | POA: Diagnosis not present

## 2021-02-02 DIAGNOSIS — S8391XA Sprain of unspecified site of right knee, initial encounter: Secondary | ICD-10-CM | POA: Diagnosis not present

## 2021-02-02 DIAGNOSIS — F17200 Nicotine dependence, unspecified, uncomplicated: Secondary | ICD-10-CM | POA: Diagnosis not present

## 2021-02-02 DIAGNOSIS — I2510 Atherosclerotic heart disease of native coronary artery without angina pectoris: Secondary | ICD-10-CM | POA: Diagnosis not present

## 2021-02-02 DIAGNOSIS — E785 Hyperlipidemia, unspecified: Secondary | ICD-10-CM | POA: Diagnosis not present

## 2021-02-02 DIAGNOSIS — X58XXXA Exposure to other specified factors, initial encounter: Secondary | ICD-10-CM | POA: Diagnosis not present

## 2021-02-02 DIAGNOSIS — I803 Phlebitis and thrombophlebitis of lower extremities, unspecified: Secondary | ICD-10-CM | POA: Diagnosis not present

## 2021-02-02 DIAGNOSIS — I10 Essential (primary) hypertension: Secondary | ICD-10-CM | POA: Diagnosis not present

## 2021-02-02 MED ORDER — ATORVASTATIN CALCIUM 80 MG PO TABS: 80.0000 mg | ORAL_TABLET | Freq: Every day | ORAL | 3 refills | Status: DC

## 2021-02-02 NOTE — Telephone Encounter (Signed)
-----   Message from Arnoldo Lenis, MD sent at 02/02/2021 11:57 AM EST ----- Cholesterol a little high, can we increase his atorvastatin to 80mg  daily. His goal LDL is less than 70, he was at 87 but this check.  Zandra Abts MD

## 2021-02-02 NOTE — Telephone Encounter (Signed)
Patient verbalized understanding. Medication changes were made.

## 2021-03-06 ENCOUNTER — Telehealth (INDEPENDENT_AMBULATORY_CARE_PROVIDER_SITE_OTHER): Payer: Self-pay

## 2021-03-06 ENCOUNTER — Other Ambulatory Visit: Payer: Self-pay

## 2021-03-06 ENCOUNTER — Ambulatory Visit: Admission: EM | Admit: 2021-03-06 | Discharge: 2021-03-06 | Payer: Medicare HMO

## 2021-03-06 DIAGNOSIS — J0120 Acute ethmoidal sinusitis, unspecified: Secondary | ICD-10-CM | POA: Diagnosis not present

## 2021-03-06 DIAGNOSIS — R197 Diarrhea, unspecified: Secondary | ICD-10-CM | POA: Diagnosis not present

## 2021-03-06 DIAGNOSIS — F17200 Nicotine dependence, unspecified, uncomplicated: Secondary | ICD-10-CM | POA: Diagnosis not present

## 2021-03-06 DIAGNOSIS — Z5329 Procedure and treatment not carried out because of patient's decision for other reasons: Secondary | ICD-10-CM | POA: Diagnosis not present

## 2021-03-06 DIAGNOSIS — I10 Essential (primary) hypertension: Secondary | ICD-10-CM | POA: Diagnosis not present

## 2021-03-06 DIAGNOSIS — I2510 Atherosclerotic heart disease of native coronary artery without angina pectoris: Secondary | ICD-10-CM | POA: Diagnosis not present

## 2021-03-06 DIAGNOSIS — R42 Dizziness and giddiness: Secondary | ICD-10-CM | POA: Diagnosis not present

## 2021-03-06 DIAGNOSIS — R001 Bradycardia, unspecified: Secondary | ICD-10-CM | POA: Diagnosis not present

## 2021-03-06 NOTE — Telephone Encounter (Signed)
Patient and wife called today and stated that patient has been having diarrhea since Dr. Harl Bowie increased his Atorvastatin to 80 mg and patient stated that he went to work today and almost passed out and was having dizziness and patient was sent home. Patient denies any chest pain/pressure, SOB, or heaviness, pain, or tingling in arms or legs, patient did stated that he has had numbness in hands/arms for a long time and happens at night, no new symptoms. Patient also stated that he drinks very little water in response to my question.  Advised patient to go to [facility] since we do not have a doctor in the office today. Patient verbalized an understanding and stated that he will go to the UC for examination.

## 2021-03-06 NOTE — ED Triage Notes (Signed)
States he went to work this morning and got lightheaded and almost hit the floor.  States he has not been drinking a lot of water.  Has had low calcium in the past.     Patient instructed to go to the ER based on symptoms

## 2021-03-16 ENCOUNTER — Ambulatory Visit (INDEPENDENT_AMBULATORY_CARE_PROVIDER_SITE_OTHER): Payer: Medicare Other | Admitting: Nurse Practitioner

## 2021-03-22 ENCOUNTER — Ambulatory Visit (INDEPENDENT_AMBULATORY_CARE_PROVIDER_SITE_OTHER): Payer: Medicare HMO | Admitting: Nurse Practitioner

## 2021-03-22 ENCOUNTER — Encounter (INDEPENDENT_AMBULATORY_CARE_PROVIDER_SITE_OTHER): Payer: Self-pay | Admitting: Nurse Practitioner

## 2021-03-22 ENCOUNTER — Other Ambulatory Visit: Payer: Self-pay

## 2021-03-22 VITALS — BP 134/84 | HR 66 | Temp 97.5°F | Ht 63.1 in | Wt 141.4 lb

## 2021-03-22 DIAGNOSIS — Z0000 Encounter for general adult medical examination without abnormal findings: Secondary | ICD-10-CM

## 2021-03-22 NOTE — Progress Notes (Signed)
Subjective:   Ronald Mcdowell is a 75 y.o. male who presents for Medicare Annual/Subsequent preventive examination.  Review of Systems    Cardiac Risk Factors include: advanced age (>34men, >2 women);dyslipidemia;male gender;hypertension;smoking/ tobacco exposure     Objective:    Today's Vitals   03/22/21 1431  BP: 134/84  Pulse: 66  Temp: (!) 97.5 F (36.4 C)  TempSrc: Temporal  SpO2: 98%  Weight: 141 lb 6.4 oz (64.1 kg)  Height: 5' 3.1" (1.603 m)   Body mass index is 24.97 kg/m.  Advanced Directives 03/22/2021 05/31/2020 04/16/2020 03/27/2020 03/09/2020 01/20/2018 01/09/2018  Does Patient Have a Medical Advance Directive? No No No Yes Yes No No  Type of Advance Directive - - - Living will Living will - -  Does patient want to make changes to medical advance directive? - - - - No - Patient declined - -  Would patient like information on creating a medical advance directive? No - Patient declined - No - Patient declined - - No - Patient declined No - Patient declined  Pre-existing out of facility DNR order (yellow form or pink MOST form) - - - - - - -    Current Medications (verified) Outpatient Encounter Medications as of 03/22/2021  Medication Sig  . amLODipine (NORVASC) 5 MG tablet Take 1 tablet (5 mg total) by mouth daily.  [name] apixaban (ELIQUIS) 5 MG TABS tablet TAKE 1 TABLET(5 MG) BY MOUTH TWICE DAILY  . atorvastatin (LIPITOR) 80 MG tablet Take 1 tablet (80 mg total) by mouth daily.  . bisoprolol (ZEBETA) 5 MG tablet Take 1 tablet (5 mg total) by mouth daily.  . Cholecalciferol (VITAMIN D-3) 125 MCG (5000 UT) TABS Take 1 tablet by mouth daily.  [name] ketoconazole (NIZORAL) 2 % cream SMARTSIG:1 Sparingly Topical Twice Daily PRN  . lisinopril (ZESTRIL) 20 MG tablet Take 1 tablet (20 mg total) by mouth daily.  . nitroGLYCERIN (NITROSTAT) 0.4 MG SL tablet Place 1 tablet (0.4 mg total) under the tongue every 5 (five) minutes as needed for chest pain.  . nitroGLYCERIN (NITROSTAT) 0.4  MG SL tablet Place 1 tablet (0.4 mg total) under the tongue every 5 (five) minutes as needed for chest pain.  . pantoprazole (PROTONIX) 40 MG tablet Take 1 tablet (40 mg total) by mouth daily.  . potassium chloride SA (KLOR-CON) 20 MEQ tablet Take 1 tablet (20 mEq total) by mouth daily.   No facility-administered encounter medications on file as of 03/22/2021.    Allergies (verified) Patient has no known allergies.   History: Past Medical History:  Diagnosis Date  . Anemia   . Atrial fibrillation ([place])   . Coronary artery disease    a. h/o MI in 2000 w/ prior LCX stenting;  b. 7.2014 Abnl Cardiolite, EF 41%, mod-large inferolat scar;  c. 07/2013 Cath/PCI: LM nl, LAD 10-20, D1 40-50p, LCX 95-99 @ distal stent margin (2.5x20 Promus Premier DES), RCA dom, 40p, 67m, 70d, EF 35-40%.  . Elevated cholesterol   . GERD (gastroesophageal reflux disease)   . Graves disease 04/2020   s/p I-131 thyroid ablation   . Hemorrhoids   . Hypertension   . Hyperthyroidism 09/27/2020  . Ischemic cardiomyopathy    a. 07/2013 EF 35-40% by LV gram.; EF 50% on ECHO in November 2020  . Myocardial infarction ([place])   . Reflux esophagitis   . Spondylosis of cervical joint    C3-4, C6-7  . Tobacco user   . Vitamin D deficiency disease 08/24/2019  Past Surgical History:  Procedure Laterality Date  . BIOPSY  01/09/2018   Procedure: BIOPSY;  Surgeon: Daneil Dolin, MD;  [facility];  Service: Endoscopy;;  esophageal biopsies  . CARDIAC CATHETERIZATION    . COLONOSCOPY    . COLONOSCOPY N/A 01/25/2016   RMR: normal ileocolonoscopy ( anal canal hemorrhoids)  . CORONARY ANGIOPLASTY WITH STENT PLACEMENT  08/23/2013   mid circumflex  DES    by Dr Martinique  . CORONARY STENT PLACEMENT  2000  . ESOPHAGOGASTRODUODENOSCOPY N/A 01/25/2016   RMR: Reflux esophagitits. Cervical web with passage of the scope. Status post esophageal biosy. Hiatal hernia.   [name] ESOPHAGOGASTRODUODENOSCOPY N/A 01/09/2018   Rourk:  cervical/proximal esophageal web s/p dilation, markedly abnormal esophagus c/w biopsy proven eosinophilic esophagitis. small hiatal hernia.  [name] ESOPHAGOGASTRODUODENOSCOPY N/A 05/31/2020   Procedure: ESOPHAGOGASTRODUODENOSCOPY (EGD);  Surgeon: Daneil Dolin, MD;  [facility];  Service: Endoscopy;  Laterality: N/A;  8:30am  . LEFT HEART CATHETERIZATION WITH CORONARY ANGIOGRAM N/A 08/23/2013   Procedure: LEFT HEART CATHETERIZATION WITH CORONARY ANGIOGRAM;  Surgeon: Peter M Martinique, MD;  Location: [facility] CATH LAB;  Service: Cardiovascular;  Laterality: N/A;  . [name] DILATION N/A 01/09/2018   Procedure: [name] DILATION;  Surgeon: Daneil Dolin, MD;  [facility];  Service: Endoscopy;  Laterality: N/A;  . [name] DILATION N/A 05/31/2020   Procedure: [name] DILATION;  Surgeon: Daneil Dolin, MD;  [facility];  Service: Endoscopy;  Laterality: N/A;  . TRANSURETHRAL RESECTION OF PROSTATE  01/09/2012   Procedure: TRANSURETHRAL RESECTION OF THE PROSTATE (TURP);  Surgeon: Marissa Nestle, MD;  Location: [facility];  Service: Urology;  Laterality: N/A;   Family History  Problem Relation Age of Onset  . Early death Mother        childbirth  . Heart attack Father   . Cancer Father   . Heart disease Brother        CABG  . Heart disease Brother        slight heart attack  . Anesthesia problems Neg Hx   . Hypotension Neg Hx   . Malignant hyperthermia Neg Hx   . Pseudochol deficiency Neg Hx   . Colon cancer Neg Hx   . Liver disease Neg Hx   . Gastric cancer Neg Hx   . Esophageal cancer Neg Hx    Social History   Socioeconomic History  . Marital status: Married    Spouse name: [name]  . Number of children: Not on file  . Years of education: 11th grade  . Highest education level: 11th grade  Occupational History  . Occupation: Librarian, academic  Tobacco Use  . Smoking status: Current Every Day Smoker    Packs/day: 1.00    Years: 53.00    Pack years: 53.00    Types:  Cigarettes    Start date: 12/09/1965  . Smokeless tobacco: Never Used  . Tobacco comment: 3/4 pack of cigarettes daily; smoked since 75 years old  Vaping Use  . Vaping Use: Never used  Substance and Sexual Activity  . Alcohol use: No    Alcohol/week: 0.0 standard drinks    Comment: No bowel since year 2000.  Some previous heavy use.  . Drug use: No  . Sexual activity: Yes    Birth control/protection: None  Other Topics Concern  . 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   Social Determinants  of Health   Financial Resource Strain: Not on file  Food Insecurity: Not on file  Transportation Needs: Not on file  Physical Activity: Not on file  Stress: Not on file  Social Connections: Not on file    Tobacco Counseling Ready to quit: Not Answered Counseling given: Yes Comment: 3/4 pack of cigarettes daily; smoked since 75 years old   Clinical Intake:  Pre-visit preparation completed: Yes  Pain : No/denies pain     BMI - recorded: 24.97 Nutritional Status: BMI of 19-24  Normal Nutritional Risks: None Diabetes: No  How often do you need to have someone help you when you read instructions, pamphlets, or other written materials from your doctor or pharmacy?: 1 - Never What is the last grade level you completed in school?: 11th grade  Diabetic? No     Information entered by :: Jiles Prows, NP-C   Activities of Daily Living In your present state of health, do you have any difficulty performing the following activities: 03/22/2021  Hearing? N  Vision? N  Difficulty concentrating or making decisions? N  Walking or climbing stairs? N  Dressing or bathing? N  Doing errands, shopping? N  Preparing Food and eating ? N  Using the Toilet? N  In the past six months, have you accidently leaked urine? N  Do you have problems with loss of bowel control? N  Managing your  Medications? N  Managing your Finances? N  Housekeeping or managing your Housekeeping? N  Some recent data might be hidden    Patient Care Team: Wilson Singer, MD as PCP - General (Internal Medicine) Wyline Mood Dorothe Pea, MD as PCP - Cardiology (Cardiology) Jena Gauss Gerrit Friends, MD as Consulting Physician (Gastroenterology)  Indicate any recent Medical Services you may have received from other than [facility] providers in the past year (date may be approximate).     Assessment:   This is a routine wellness examination for [place].  Hearing/Vision screen No exam data present  Dietary issues and exercise activities discussed: Current Exercise Habits: The patient does not participate in regular exercise at present (Participates in a lot of yard work), Exercise limited by: None identified  Goals    . Quit Smoking      Depression Screen PHQ 2/9 Scores 03/22/2021 12/04/2020 03/09/2020 11/04/2017  PHQ - 2 Score 0 0 0 0  PHQ- 9 Score 0 0 - -  Exception Documentation - - Medical reason -    Fall Risk Fall Risk  03/22/2021 12/04/2020 03/09/2020 11/04/2017  Falls in the past year? 0 0 0 No  Number falls in past yr: - - 0 -  Injury with Fall? - - 0 -  Risk for fall due to : - - No Fall Risks -  Follow up - - Falls evaluation completed -    FALL RISK PREVENTION PERTAINING TO THE HOME:  Any stairs in or around the home? No  If so, are there any without handrails? N/A Home free of loose throw rugs in walkways, pet beds, electrical cords, etc? Yes  Adequate lighting in your home to reduce risk of falls? Yes   ASSISTIVE DEVICES UTILIZED TO PREVENT FALLS:  Life alert? No  Use of a cane, walker or w/c? No  Grab bars in the bathroom? No  Shower chair or bench in shower? No  Elevated toilet seat or a handicapped toilet? No   TIMED UP AND GO:  Was the test performed? Yes .  Length of time to ambulate 10  feet: 7 sec.   Gait steady and fast without use of assistive device  Cognitive  Function:     6CIT Screen 03/22/2021 03/09/2020 03/09/2020  What Year? 0 points - 0 points  What month? 0 points - 0 points  What time? 0 points - 0 points  Count back from 20 0 points - 0 points  Months in reverse 0 points - 0 points  Repeat phrase 2 points 4 points 0 points  Total Score 2 - 0    Immunizations Immunization History  Administered Date(s) Administered  . Fluad Quad(high Dose 65+) 12/04/2020  . Influenza Split 09/13/2019  . Influenza,inj,Quad PF,6+ Mos 08/24/2013, 11/04/2017  . Influenza-Unspecified 10/14/2009, 12/07/2010, 09/26/2019  . Moderna Sars-Covid-2 Vaccination 02/15/2020, 03/14/2020, 09/27/2020  . Pneumococcal Conjugate-13 04/22/2019  . Pneumococcal Polysaccharide-23 12/25/2007  . Td 02/23/2017    TDAP status: Up to date  Flu Vaccine status: Up to date  Pneumococcal vaccine status: Declined,  Education has been provided regarding the importance of this vaccine but patient still declined. Advised may receive this vaccine at local pharmacy or Health Dept. Aware to provide a copy of the vaccination record if obtained from local pharmacy or Health Dept. Verbalized acceptance and understanding.   Covid-19 vaccine status: Completed vaccines  Qualifies for Shingles Vaccine? Yes   Zostavax completed No   Shingrix Completed?: No.    Education has been provided regarding the importance of this vaccine. Patient has been advised to call insurance company to determine out of pocket expense if they have not yet received this vaccine. Advised may also receive vaccine at local pharmacy or Health Dept. Verbalized acceptance and understanding.  Screening Tests Health Maintenance  Topic Date Due  . PNA vac Low Risk Adult (2 of 2 - PPSV23) 04/21/2020  . INFLUENZA VACCINE  06/25/2021  . COLONOSCOPY (Pts 45-33yrs Insurance coverage will need to be confirmed)  01/24/2026  . TETANUS/TDAP  02/24/2027  . COVID-19 Vaccine  Completed  . Hepatitis C Screening  Completed  . HPV  VACCINES  Aged Out    Health Maintenance  Health Maintenance Due  Topic Date Due  . PNA vac Low Risk Adult (2 of 2 - PPSV23) 04/21/2020    Colorectal cancer screening: Type of screening: Colonoscopy. Completed 2017. Repeat every 10 years  Lung Cancer Screening: (Low Dose CT Chest recommended if Age 24-80 years, 30 pack-year currently smoking OR have quit w/in 15years.) does qualify. (pack year 108)  Lung Cancer Screening Referral: PATIENT PREFERS TO HOLD OFF OF SCAN AT THIS TIME  Additional Screening:  Hepatitis C Screening: does not qualify; Completed 12/2020  Vision Screening: Recommended annual ophthalmology exams for early detection of glaucoma and other disorders of the eye. Is the patient up to date with their annual eye exam?  Yes  Who is the provider or what is the name of the office in which the patient attends annual eye exams? [facility] [place] If pt is not established with a provider, would they like to be referred to a provider to establish care? No .   Dental Screening: Recommended annual dental exams for proper oral hygiene  Community Resource Referral / Chronic Care Management: CRR required this visit?  No   CCM required this visit?  No      Plan:    Patient would be due for pneumonia 23 vaccine today, however he does not want to have this administered today.  He is up-to-date with other recommended screenings for male of his age.  I have personally reviewed and noted the following in the patient's chart:   . Medical and social history . Use of alcohol, tobacco or illicit drugs  . Current medications and supplements . Functional ability and status . Nutritional status . Physical activity . Advanced directives . List of other physicians . Hospitalizations, surgeries, and ER visits in previous 12 months . Vitals . Screenings to include cognitive, depression, and falls . Referrals and appointments  In addition, I have reviewed and discussed with patient  certain preventive protocols, quality metrics, and best practice recommendations. A written personalized care plan for preventive services as well as general preventive health recommendations were provided to patient.    He will follow-up for office visit in 3 months and his annual wellness visit in 1 year.  Ailene Ards, NP   03/22/2021

## 2021-03-22 NOTE — Patient Instructions (Signed)
  Ronald Mcdowell , Thank you for taking time to come for your Medicare Wellness Visit. I appreciate your ongoing commitment to your health goals. Please review the following plan we discussed and let me know if I can assist you in the future.   These are the goals we discussed: Goals    . Quit Smoking       This is a list of the screening recommended for you and due dates:  Health Maintenance  Topic Date Due  . Pneumonia vaccines (2 of 2 - PPSV23) 04/21/2020  . Flu Shot  06/25/2021  . Colon Cancer Screening  01/24/2026  . Tetanus Vaccine  02/24/2027  . COVID-19 Vaccine  Completed  .  Hepatitis C: One time screening is recommended by Center for Disease Control  (CDC) for  adults born from [birth year] through 1964.   Completed  . HPV Vaccine  Aged Out

## 2021-05-07 DIAGNOSIS — H2513 Age-related nuclear cataract, bilateral: Secondary | ICD-10-CM | POA: Diagnosis not present

## 2021-05-07 DIAGNOSIS — H524 Presbyopia: Secondary | ICD-10-CM | POA: Diagnosis not present

## 2021-05-07 DIAGNOSIS — H52223 Regular astigmatism, bilateral: Secondary | ICD-10-CM | POA: Diagnosis not present

## 2021-05-14 DIAGNOSIS — H2513 Age-related nuclear cataract, bilateral: Secondary | ICD-10-CM | POA: Diagnosis not present

## 2021-05-14 DIAGNOSIS — H40033 Anatomical narrow angle, bilateral: Secondary | ICD-10-CM | POA: Diagnosis not present

## 2021-05-21 DIAGNOSIS — Z01818 Encounter for other preprocedural examination: Secondary | ICD-10-CM | POA: Diagnosis not present

## 2021-05-21 DIAGNOSIS — H2512 Age-related nuclear cataract, left eye: Secondary | ICD-10-CM | POA: Diagnosis not present

## 2021-05-28 ENCOUNTER — Other Ambulatory Visit (INDEPENDENT_AMBULATORY_CARE_PROVIDER_SITE_OTHER): Payer: Self-pay | Admitting: Internal Medicine

## 2021-05-30 DIAGNOSIS — H2512 Age-related nuclear cataract, left eye: Secondary | ICD-10-CM | POA: Diagnosis not present

## 2021-05-30 DIAGNOSIS — H25812 Combined forms of age-related cataract, left eye: Secondary | ICD-10-CM | POA: Diagnosis not present

## 2021-06-04 DIAGNOSIS — H04123 Dry eye syndrome of bilateral lacrimal glands: Secondary | ICD-10-CM | POA: Diagnosis not present

## 2021-06-15 ENCOUNTER — Other Ambulatory Visit (INDEPENDENT_AMBULATORY_CARE_PROVIDER_SITE_OTHER): Payer: Self-pay | Admitting: Internal Medicine

## 2021-06-15 DIAGNOSIS — E785 Hyperlipidemia, unspecified: Secondary | ICD-10-CM

## 2021-06-15 DIAGNOSIS — I10 Essential (primary) hypertension: Secondary | ICD-10-CM

## 2021-06-15 DIAGNOSIS — K2100 Gastro-esophageal reflux disease with esophagitis, without bleeding: Secondary | ICD-10-CM

## 2021-06-28 ENCOUNTER — Encounter (INDEPENDENT_AMBULATORY_CARE_PROVIDER_SITE_OTHER): Payer: Self-pay

## 2021-07-12 ENCOUNTER — Ambulatory Visit (INDEPENDENT_AMBULATORY_CARE_PROVIDER_SITE_OTHER): Payer: Medicare HMO | Admitting: Internal Medicine

## 2021-08-07 ENCOUNTER — Ambulatory Visit: Payer: Medicare HMO | Admitting: Cardiology

## 2021-08-07 ENCOUNTER — Encounter: Payer: Self-pay | Admitting: Cardiology

## 2021-08-07 ENCOUNTER — Other Ambulatory Visit: Payer: Self-pay

## 2021-08-07 VITALS — BP 136/92 | HR 90 | Ht 65.0 in | Wt 144.4 lb

## 2021-08-07 DIAGNOSIS — I4891 Unspecified atrial fibrillation: Secondary | ICD-10-CM | POA: Diagnosis not present

## 2021-08-07 DIAGNOSIS — I2510 Atherosclerotic heart disease of native coronary artery without angina pectoris: Secondary | ICD-10-CM

## 2021-08-07 DIAGNOSIS — E782 Mixed hyperlipidemia: Secondary | ICD-10-CM | POA: Diagnosis not present

## 2021-08-07 DIAGNOSIS — I10 Essential (primary) hypertension: Secondary | ICD-10-CM | POA: Diagnosis not present

## 2021-08-07 NOTE — Patient Instructions (Signed)
Medication Instructions:  Continue all current medications.  Labwork: FLP - order given today.  Reminder:  Nothing to eat or drink after 12 midnight prior to labs. Office will contact with results via phone or letter.     Testing/Procedures: none  Follow-Up: 6 months   Any Other Special Instructions Will Be Listed Below (If Applicable).   If you need a refill on your cardiac medications before your next appointment, please call your pharmacy.

## 2021-08-07 NOTE — Progress Notes (Signed)
Clinical Summary Ronald Mcdowell is a 75 y.o.male seen today for follow up of the following medical problems.   1. CAD - Sept 2014 stenting to LCX, medically managed RCA disease but could consider intervention if neccesary  - no chest pain. No SOB or DOE - compliant with meds   2. Afib - no recent palpitations - no bleeding on eliquis.   - ER 02/2021 visit, some dizziness. HRs noted high 40s at times during that evaluation - no recent palpitations     3. HTN - compliant with meds     4. Hyperlipidemia   01/2021 TC 136 HDL 34 TG 66 LDL 87. After these labs we increased lipitor to '80mg'$  daily.    SH: works at [employer] in [place], works Glass blower/designer.  Past Medical History:  Diagnosis Date   Anemia    Atrial fibrillation ([place])    Coronary artery disease    a. h/o MI in 2000 w/ prior LCX stenting;  b. 7.2014 Abnl Cardiolite, EF 41%, mod-large inferolat scar;  c. 07/2013 Cath/PCI: LM nl, LAD 10-20, D1 40-50p, LCX 95-99 @ distal stent margin (2.5x20 Promus Premier DES), RCA dom, 40p, 33m 70d, EF 35-40%.   Elevated cholesterol    GERD (gastroesophageal reflux disease)    Graves disease 04/2020   s/p I-131 thyroid ablation    Hemorrhoids    Hypertension    Hyperthyroidism 09/27/2020   Ischemic cardiomyopathy    a. 07/2013 EF 35-40% by LV gram.; EF 50% on ECHO in November 2020   [facility] ([facility]    Reflux esophagitis    Spondylosis of cervical joint    C3-4, C6-7   Tobacco user    Vitamin D deficiency disease 08/24/2019     No Known Allergies   Current Outpatient Medications  Medication Sig Dispense Refill   amLODipine (NORVASC) 5 MG tablet Take 1 tablet (5 mg total) by mouth daily. 90 tablet 1   apixaban (ELIQUIS) 5 MG TABS tablet TAKE 1 TABLET(5 MG) BY MOUTH TWICE DAILY 180 tablet 2   atorvastatin (LIPITOR) 80 MG tablet Take 1 tablet (80 mg total) by mouth daily. 90 tablet 3   bisoprolol (ZEBETA) 5 MG tablet Take 1 tablet (5 mg total) by mouth daily. 90  tablet 1   Cholecalciferol (VITAMIN D-3) 125 MCG (5000 UT) TABS Take 1 tablet by mouth daily.     ketoconazole (NIZORAL) 2 % cream SMARTSIG:1 Sparingly Topical Twice Daily PRN     lisinopril (ZESTRIL) 20 MG tablet Take 1 tablet (20 mg total) by mouth daily. 90 tablet 1   nitroGLYCERIN (NITROSTAT) 0.4 MG SL tablet Place 1 tablet (0.4 mg total) under the tongue every 5 (five) minutes as needed for chest pain. 30 tablet 0   nitroGLYCERIN (NITROSTAT) 0.4 MG SL tablet Place 1 tablet (0.4 mg total) under the tongue every 5 (five) minutes as needed for chest pain. 30 tablet 3   pantoprazole (PROTONIX) 40 MG tablet TAKE 1 TABLET EVERY DAY 90 tablet 0   potassium chloride SA (KLOR-CON) 20 MEQ tablet TAKE 1 TABLET EVERY DAY 90 tablet 1   No current facility-administered medications for this visit.     Past Surgical History:  Procedure Laterality Date   BIOPSY  01/09/2018   Procedure: BIOPSY;  Surgeon: RDaneil Dolin MD;  [facility];  Service: Endoscopy;;  esophageal biopsies   CARDIAC CATHETERIZATION     COLONOSCOPY     COLONOSCOPY N/A 01/25/2016   RMR: normal  ileocolonoscopy ( anal canal hemorrhoids)   CORONARY ANGIOPLASTY WITH STENT PLACEMENT  08/23/2013   mid circumflex  DES    by Dr Martinique   CORONARY STENT PLACEMENT  2000   ESOPHAGOGASTRODUODENOSCOPY N/A 01/25/2016   RMR: Reflux esophagitits. Cervical web with passage of the scope. Status post esophageal biosy. Hiatal hernia.    ESOPHAGOGASTRODUODENOSCOPY N/A 01/09/2018   Rourk: cervical/proximal esophageal web s/p dilation, markedly abnormal esophagus c/w biopsy proven eosinophilic esophagitis. small hiatal hernia.   ESOPHAGOGASTRODUODENOSCOPY N/A 05/31/2020   Procedure: ESOPHAGOGASTRODUODENOSCOPY (EGD);  Surgeon: Daneil Dolin, MD;  [facility];  Service: Endoscopy;  Laterality: N/A;  8:30am   LEFT HEART CATHETERIZATION WITH CORONARY ANGIOGRAM N/A 08/23/2013   Procedure: LEFT HEART CATHETERIZATION WITH CORONARY  ANGIOGRAM;  Surgeon: Peter M Martinique, MD;  Location: [facility] CATH LAB;  Service: Cardiovascular;  Laterality: N/A;   MALONEY DILATION N/A 01/09/2018   Procedure: [name] DILATION;  Surgeon: Daneil Dolin, MD;  [facility];  Service: Endoscopy;  Laterality: N/A;   MALONEY DILATION N/A 05/31/2020   Procedure: [name] DILATION;  Surgeon: Daneil Dolin, MD;  [facility];  Service: Endoscopy;  Laterality: N/A;   TRANSURETHRAL RESECTION OF PROSTATE  01/09/2012   Procedure: TRANSURETHRAL RESECTION OF THE PROSTATE (TURP);  Surgeon: Marissa Nestle, MD;  Location: [facility];  Service: Urology;  Laterality: N/A;     No Known Allergies    Family History  Problem Relation Age of Onset   Early death Mother        childbirth   Heart attack Father    Cancer Father    Heart disease Brother        CABG   Heart disease Brother        slight heart attack   Anesthesia problems Neg Hx    Hypotension Neg Hx    Malignant hyperthermia Neg Hx    Pseudochol deficiency Neg Hx    Colon cancer Neg Hx    Liver disease Neg Hx    Gastric cancer Neg Hx    Esophageal cancer Neg Hx      Social History Ronald Mcdowell reports that he has been smoking cigarettes. He started smoking about 55 years ago. He has a 53.00 pack-year smoking history. He has never used smokeless tobacco. Ronald Mcdowell reports no history of alcohol use.   Review of Systems CONSTITUTIONAL: No weight loss, fever, chills, weakness or fatigue.  HEENT: Eyes: No visual loss, blurred vision, double vision or yellow sclerae.No hearing loss, sneezing, congestion, runny nose or sore throat.  SKIN: No rash or itching.  CARDIOVASCULAR: per hpi RESPIRATORY: No shortness of breath, cough or sputum.  GASTROINTESTINAL: No anorexia, nausea, vomiting or diarrhea. No abdominal pain or blood.  GENITOURINARY: No burning on urination, no polyuria NEUROLOGICAL: No headache, dizziness, syncope, paralysis, ataxia, numbness or tingling in the  extremities. No change in bowel or bladder control.  MUSCULOSKELETAL: No muscle, back pain, joint pain or stiffness.  LYMPHATICS: No enlarged nodes. No history of splenectomy.  PSYCHIATRIC: No history of depression or anxiety.  ENDOCRINOLOGIC: No reports of sweating, cold or heat intolerance. No polyuria or polydipsia.  [name]   Physical Examination Today's Vitals   08/07/21 1551  BP: (!) 136/92  Pulse: 90  SpO2: 99%  Weight: 144 lb 6.4 oz (65.5 kg)  Height: '5\' 5"'$  (1.651 m)   Body mass index is 24.03 kg/m.  Gen: resting comfortably, no acute distress HEENT: no scleral icterus, pupils equal round and  reactive, no palptable cervical adenopathy,  CV: RRR, no m/rg, no jvd Resp: Clear to auscultation bilaterally GI: abdomen is soft, non-tender, non-distended, normal bowel sounds, no hepatosplenomegaly MSK: extremities are warm, no edema.  Skin: warm, no rash Neuro:  no focal deficits Psych: appropriate affect   Diagnostic Studies  Echocardiogram 10/20/2019:    1. Left ventricular ejection fraction, by visual estimation, is 50%. The left ventricle has low normal function. There is mildly increased left ventricular hypertrophy.  2. Basal and mid inferolateral wall, basal inferior segment, mid anterolateral segment, and mid inferior segment are abnormal.  3. Elevated left ventricular end-diastolic pressure.  4. Left ventricular diastolic parameters are consistent with Grade I diastolic dysfunction (impaired relaxation).  5. Global right ventricle has normal systolic function.The right ventricular size is normal. No increase in right ventricular wall thickness.  6. Left atrial size was severely dilated.  7. Right atrial size was normal.  8. Mild mitral annular calcification.  9. Moderate aortic valve annular calcification. 10. The mitral valve is degenerative. Mild mitral valve regurgitation. 11. The tricuspid valve is grossly normal. Tricuspid valve regurgitation is not  demonstrated. 12. The aortic valve is tricuspid. Aortic valve regurgitation is not visualized. Mild aortic valve sclerosis without stenosis. 13. There is Mild calcification of the aortic valve. 14. There is Mild thickening of the aortic valve. 15. The pulmonic valve was grossly normal. Pulmonic valve regurgitation is trivial. 16. Normal pulmonary artery systolic pressure. 17. The inferior vena cava is normal in size with greater than 50% respiratory variability, suggesting right atrial pressure of 3 mmHg.   Assessment and Plan  1. CAD - denies any symptoms, continue current meds   2. HTN Continue current meds   3. Hyperlipidemia - we increased his lipitor 01/2021, will repeat panel to reevaluate lipids  4. Afib - no symptoms, continue current med - ekg shows rate controlled afib  5. Dizziness - er visit 02/2021 - mild infrequent symptosm recently, primarily orthostatic in description. Discussed increased oral hydration.   Arnoldo Lenis, M.D.

## 2021-08-13 DIAGNOSIS — E782 Mixed hyperlipidemia: Secondary | ICD-10-CM | POA: Diagnosis not present

## 2021-09-05 ENCOUNTER — Telehealth: Payer: Self-pay | Admitting: *Deleted

## 2021-09-05 NOTE — Telephone Encounter (Signed)
Laurine Blazer, LPN  [id number]  4:91 PM EDT Back to Top    Notified, copy to pcp.

## 2021-09-05 NOTE — Telephone Encounter (Signed)
-----   Message from Arnoldo Lenis, MD sent at 09/03/2021 10:09 AM EDT ----- Cholesterol looks good   Zandra Abts MD

## 2021-10-02 ENCOUNTER — Other Ambulatory Visit: Payer: Self-pay

## 2021-10-02 DIAGNOSIS — K2100 Gastro-esophageal reflux disease with esophagitis, without bleeding: Secondary | ICD-10-CM

## 2021-10-02 DIAGNOSIS — E785 Hyperlipidemia, unspecified: Secondary | ICD-10-CM

## 2021-10-02 DIAGNOSIS — I10 Essential (primary) hypertension: Secondary | ICD-10-CM

## 2021-10-02 MED ORDER — BISOPROLOL FUMARATE 5 MG PO TABS: 5.0000 mg | ORAL_TABLET | Freq: Every day | ORAL | 1 refills | Status: DC

## 2021-10-02 MED ORDER — PANTOPRAZOLE SODIUM 40 MG PO TBEC
40.0000 mg | DELAYED_RELEASE_TABLET | Freq: Every day | ORAL | 0 refills | Status: DC
Start: 2021-10-02 — End: 2021-10-09

## 2021-10-02 MED ORDER — AMLODIPINE BESYLATE 5 MG PO TABS: 5.0000 mg | ORAL_TABLET | Freq: Every day | ORAL | 1 refills | Status: DC

## 2021-10-02 MED ORDER — LISINOPRIL 20 MG PO TABS: 20.0000 mg | ORAL_TABLET | Freq: Every day | ORAL | 1 refills | Status: DC

## 2021-10-02 MED ORDER — ATORVASTATIN CALCIUM 80 MG PO TABS: 80.0000 mg | ORAL_TABLET | Freq: Every day | ORAL | 3 refills | Status: DC

## 2021-10-09 ENCOUNTER — Telehealth: Payer: Self-pay | Admitting: Cardiology

## 2021-10-09 DIAGNOSIS — K2100 Gastro-esophageal reflux disease with esophagitis, without bleeding: Secondary | ICD-10-CM

## 2021-10-09 DIAGNOSIS — I10 Essential (primary) hypertension: Secondary | ICD-10-CM

## 2021-10-09 DIAGNOSIS — E785 Hyperlipidemia, unspecified: Secondary | ICD-10-CM

## 2021-10-09 MED ORDER — AMLODIPINE BESYLATE 5 MG PO TABS: 5.0000 mg | ORAL_TABLET | Freq: Every day | ORAL | 3 refills | Status: DC

## 2021-10-09 MED ORDER — PANTOPRAZOLE SODIUM 40 MG PO TBEC: 40.0000 mg | DELAYED_RELEASE_TABLET | Freq: Every day | ORAL | 3 refills | Status: DC

## 2021-10-09 MED ORDER — ATORVASTATIN CALCIUM 80 MG PO TABS: 80.0000 mg | ORAL_TABLET | Freq: Every day | ORAL | 3 refills | Status: DC

## 2021-10-09 MED ORDER — LISINOPRIL 20 MG PO TABS: 20.0000 mg | ORAL_TABLET | Freq: Every day | ORAL | 3 refills | Status: DC

## 2021-10-09 MED ORDER — BISOPROLOL FUMARATE 5 MG PO TABS: 5.0000 mg | ORAL_TABLET | Freq: Every day | ORAL | 3 refills | Status: DC

## 2021-10-09 NOTE — Telephone Encounter (Signed)
Done

## 2021-10-09 NOTE — Telephone Encounter (Signed)
*  STAT* If patient is at the pharmacy, call can be transferred to refill team.   1. Which medications need to be refilled? (please list name of each medication and dose if known)    bisoprolol (ZEBETA) 5 MG tablet [[id number]]   amLODipine (NORVASC) 5 MG tablet [[id number]]  lisinopril (ZESTRIL) 20 MG tablet [[id number]]  pantoprazole (PROTONIX) 40 MG tablet [[id number]]   atorvastatin (LIPITOR) 80 MG tablet [[id number]]    2. Which pharmacy/location (including street and city if local pharmacy) is medication to be sent to?  [place], [place]  [place], [place] [address]  Phone:  [phone]  Fax:  [phone]    3. Do they need a 30 day or 90 day supply?   90 day    Pt was w/ Dr. Anastasio Champion- he's needing these refilled- he does not currently have a PCP

## 2021-11-01 DIAGNOSIS — K219 Gastro-esophageal reflux disease without esophagitis: Secondary | ICD-10-CM | POA: Diagnosis not present

## 2021-11-01 DIAGNOSIS — I10 Essential (primary) hypertension: Secondary | ICD-10-CM | POA: Diagnosis not present

## 2021-11-01 DIAGNOSIS — I2510 Atherosclerotic heart disease of native coronary artery without angina pectoris: Secondary | ICD-10-CM | POA: Diagnosis not present

## 2021-11-01 DIAGNOSIS — Z125 Encounter for screening for malignant neoplasm of prostate: Secondary | ICD-10-CM | POA: Diagnosis not present

## 2021-11-01 DIAGNOSIS — I4891 Unspecified atrial fibrillation: Secondary | ICD-10-CM | POA: Diagnosis not present

## 2021-11-01 DIAGNOSIS — N401 Enlarged prostate with lower urinary tract symptoms: Secondary | ICD-10-CM | POA: Diagnosis not present

## 2021-11-01 DIAGNOSIS — Z6825 Body mass index (BMI) 25.0-25.9, adult: Secondary | ICD-10-CM | POA: Diagnosis not present

## 2021-11-01 DIAGNOSIS — E7849 Other hyperlipidemia: Secondary | ICD-10-CM | POA: Diagnosis not present

## 2022-01-02 DIAGNOSIS — Z20822 Contact with and (suspected) exposure to covid-19: Secondary | ICD-10-CM | POA: Diagnosis not present

## 2022-01-02 DIAGNOSIS — R0602 Shortness of breath: Secondary | ICD-10-CM | POA: Diagnosis not present

## 2022-01-02 DIAGNOSIS — Z720 Tobacco use: Secondary | ICD-10-CM | POA: Diagnosis not present

## 2022-01-02 DIAGNOSIS — I2510 Atherosclerotic heart disease of native coronary artery without angina pectoris: Secondary | ICD-10-CM | POA: Diagnosis not present

## 2022-01-02 DIAGNOSIS — K2100 Gastro-esophageal reflux disease with esophagitis, without bleeding: Secondary | ICD-10-CM | POA: Diagnosis not present

## 2022-01-02 DIAGNOSIS — I4891 Unspecified atrial fibrillation: Secondary | ICD-10-CM | POA: Diagnosis not present

## 2022-01-02 DIAGNOSIS — I517 Cardiomegaly: Secondary | ICD-10-CM | POA: Diagnosis not present

## 2022-01-02 DIAGNOSIS — E785 Hyperlipidemia, unspecified: Secondary | ICD-10-CM | POA: Diagnosis not present

## 2022-01-02 DIAGNOSIS — R9431 Abnormal electrocardiogram [ECG] [EKG]: Secondary | ICD-10-CM | POA: Diagnosis not present

## 2022-01-02 DIAGNOSIS — J189 Pneumonia, unspecified organism: Secondary | ICD-10-CM | POA: Diagnosis not present

## 2022-01-02 DIAGNOSIS — I252 Old myocardial infarction: Secondary | ICD-10-CM | POA: Diagnosis not present

## 2022-01-02 DIAGNOSIS — I10 Essential (primary) hypertension: Secondary | ICD-10-CM | POA: Diagnosis not present

## 2022-01-07 DIAGNOSIS — J156 Pneumonia due to other aerobic Gram-negative bacteria: Secondary | ICD-10-CM | POA: Diagnosis not present

## 2022-01-07 DIAGNOSIS — R066 Hiccough: Secondary | ICD-10-CM | POA: Diagnosis not present

## 2022-01-07 DIAGNOSIS — Z6824 Body mass index (BMI) 24.0-24.9, adult: Secondary | ICD-10-CM | POA: Diagnosis not present

## 2022-01-09 DIAGNOSIS — I255 Ischemic cardiomyopathy: Secondary | ICD-10-CM | POA: Diagnosis not present

## 2022-01-09 DIAGNOSIS — N179 Acute kidney failure, unspecified: Secondary | ICD-10-CM | POA: Diagnosis not present

## 2022-01-09 DIAGNOSIS — R109 Unspecified abdominal pain: Secondary | ICD-10-CM | POA: Diagnosis not present

## 2022-01-09 DIAGNOSIS — Z20822 Contact with and (suspected) exposure to covid-19: Secondary | ICD-10-CM | POA: Diagnosis not present

## 2022-01-09 DIAGNOSIS — R1010 Upper abdominal pain, unspecified: Secondary | ICD-10-CM | POA: Diagnosis not present

## 2022-01-09 DIAGNOSIS — K2100 Gastro-esophageal reflux disease with esophagitis, without bleeding: Secondary | ICD-10-CM | POA: Diagnosis not present

## 2022-01-09 DIAGNOSIS — I4821 Permanent atrial fibrillation: Secondary | ICD-10-CM | POA: Diagnosis not present

## 2022-01-09 DIAGNOSIS — E785 Hyperlipidemia, unspecified: Secondary | ICD-10-CM | POA: Diagnosis not present

## 2022-01-09 DIAGNOSIS — I259 Chronic ischemic heart disease, unspecified: Secondary | ICD-10-CM | POA: Diagnosis present

## 2022-01-09 DIAGNOSIS — R072 Precordial pain: Secondary | ICD-10-CM | POA: Diagnosis not present

## 2022-01-09 DIAGNOSIS — I25110 Atherosclerotic heart disease of native coronary artery with unstable angina pectoris: Secondary | ICD-10-CM | POA: Diagnosis not present

## 2022-01-09 DIAGNOSIS — Z720 Tobacco use: Secondary | ICD-10-CM | POA: Diagnosis not present

## 2022-01-09 DIAGNOSIS — Z7901 Long term (current) use of anticoagulants: Secondary | ICD-10-CM | POA: Diagnosis not present

## 2022-01-09 DIAGNOSIS — R079 Chest pain, unspecified: Secondary | ICD-10-CM | POA: Diagnosis not present

## 2022-01-09 DIAGNOSIS — I493 Ventricular premature depolarization: Secondary | ICD-10-CM | POA: Insufficient documentation

## 2022-01-09 DIAGNOSIS — J9811 Atelectasis: Secondary | ICD-10-CM | POA: Diagnosis not present

## 2022-01-10 DIAGNOSIS — R079 Chest pain, unspecified: Secondary | ICD-10-CM | POA: Diagnosis not present

## 2022-01-10 DIAGNOSIS — I10 Essential (primary) hypertension: Secondary | ICD-10-CM | POA: Diagnosis not present

## 2022-01-10 DIAGNOSIS — R945 Abnormal results of liver function studies: Secondary | ICD-10-CM | POA: Diagnosis not present

## 2022-01-10 DIAGNOSIS — N390 Urinary tract infection, site not specified: Secondary | ICD-10-CM | POA: Diagnosis not present

## 2022-01-10 DIAGNOSIS — I2510 Atherosclerotic heart disease of native coronary artery without angina pectoris: Secondary | ICD-10-CM | POA: Diagnosis not present

## 2022-01-10 DIAGNOSIS — D72829 Elevated white blood cell count, unspecified: Secondary | ICD-10-CM | POA: Diagnosis not present

## 2022-01-10 DIAGNOSIS — Z79899 Other long term (current) drug therapy: Secondary | ICD-10-CM | POA: Diagnosis not present

## 2022-01-10 DIAGNOSIS — I482 Chronic atrial fibrillation, unspecified: Secondary | ICD-10-CM | POA: Diagnosis not present

## 2022-01-10 DIAGNOSIS — I517 Cardiomegaly: Secondary | ICD-10-CM | POA: Diagnosis not present

## 2022-01-10 DIAGNOSIS — I252 Old myocardial infarction: Secondary | ICD-10-CM | POA: Diagnosis not present

## 2022-01-10 DIAGNOSIS — I25110 Atherosclerotic heart disease of native coronary artery with unstable angina pectoris: Secondary | ICD-10-CM | POA: Diagnosis not present

## 2022-01-10 DIAGNOSIS — N179 Acute kidney failure, unspecified: Secondary | ICD-10-CM | POA: Diagnosis not present

## 2022-01-11 DIAGNOSIS — I10 Essential (primary) hypertension: Secondary | ICD-10-CM | POA: Diagnosis not present

## 2022-01-11 DIAGNOSIS — I340 Nonrheumatic mitral (valve) insufficiency: Secondary | ICD-10-CM | POA: Diagnosis not present

## 2022-01-11 DIAGNOSIS — Z7901 Long term (current) use of anticoagulants: Secondary | ICD-10-CM | POA: Diagnosis not present

## 2022-01-11 DIAGNOSIS — D72829 Elevated white blood cell count, unspecified: Secondary | ICD-10-CM | POA: Diagnosis not present

## 2022-01-11 DIAGNOSIS — M179 Osteoarthritis of knee, unspecified: Secondary | ICD-10-CM | POA: Diagnosis not present

## 2022-01-11 DIAGNOSIS — R931 Abnormal findings on diagnostic imaging of heart and coronary circulation: Secondary | ICD-10-CM | POA: Diagnosis not present

## 2022-01-11 DIAGNOSIS — I482 Chronic atrial fibrillation, unspecified: Secondary | ICD-10-CM | POA: Diagnosis not present

## 2022-01-11 DIAGNOSIS — I252 Old myocardial infarction: Secondary | ICD-10-CM | POA: Diagnosis not present

## 2022-01-11 DIAGNOSIS — I25110 Atherosclerotic heart disease of native coronary artery with unstable angina pectoris: Secondary | ICD-10-CM | POA: Diagnosis not present

## 2022-01-11 DIAGNOSIS — N390 Urinary tract infection, site not specified: Secondary | ICD-10-CM | POA: Diagnosis not present

## 2022-01-11 DIAGNOSIS — R945 Abnormal results of liver function studies: Secondary | ICD-10-CM | POA: Diagnosis not present

## 2022-01-12 ENCOUNTER — Inpatient Hospital Stay (HOSPITAL_COMMUNITY)
Admission: EM | Admit: 2022-01-12 | Discharge: 2022-01-17 | DRG: 286 | Disposition: A | Discharge status: Home / Self Care | Payer: Medicare HMO | Attending: Internal Medicine | Admitting: Internal Medicine

## 2022-01-12 ENCOUNTER — Encounter (HOSPITAL_COMMUNITY): Payer: Self-pay | Admitting: Emergency Medicine

## 2022-01-12 DIAGNOSIS — I2510 Atherosclerotic heart disease of native coronary artery without angina pectoris: Secondary | ICD-10-CM | POA: Diagnosis not present

## 2022-01-12 DIAGNOSIS — E559 Vitamin D deficiency, unspecified: Secondary | ICD-10-CM | POA: Diagnosis present

## 2022-01-12 DIAGNOSIS — Z8249 Family history of ischemic heart disease and other diseases of the circulatory system: Secondary | ICD-10-CM | POA: Diagnosis not present

## 2022-01-12 DIAGNOSIS — Z91010 Allergy to peanuts: Secondary | ICD-10-CM | POA: Diagnosis not present

## 2022-01-12 DIAGNOSIS — N3000 Acute cystitis without hematuria: Secondary | ICD-10-CM | POA: Diagnosis not present

## 2022-01-12 DIAGNOSIS — N189 Chronic kidney disease, unspecified: Secondary | ICD-10-CM | POA: Diagnosis present

## 2022-01-12 DIAGNOSIS — I252 Old myocardial infarction: Secondary | ICD-10-CM | POA: Diagnosis not present

## 2022-01-12 DIAGNOSIS — Z79899 Other long term (current) drug therapy: Secondary | ICD-10-CM | POA: Diagnosis not present

## 2022-01-12 DIAGNOSIS — R072 Precordial pain: Secondary | ICD-10-CM | POA: Diagnosis not present

## 2022-01-12 DIAGNOSIS — R0789 Other chest pain: Secondary | ICD-10-CM | POA: Diagnosis not present

## 2022-01-12 DIAGNOSIS — I4891 Unspecified atrial fibrillation: Secondary | ICD-10-CM | POA: Diagnosis not present

## 2022-01-12 DIAGNOSIS — I259 Chronic ischemic heart disease, unspecified: Secondary | ICD-10-CM | POA: Diagnosis not present

## 2022-01-12 DIAGNOSIS — E785 Hyperlipidemia, unspecified: Secondary | ICD-10-CM | POA: Diagnosis present

## 2022-01-12 DIAGNOSIS — I219 Acute myocardial infarction, unspecified: Secondary | ICD-10-CM | POA: Diagnosis not present

## 2022-01-12 DIAGNOSIS — Z9079 Acquired absence of other genital organ(s): Secondary | ICD-10-CM

## 2022-01-12 DIAGNOSIS — Z720 Tobacco use: Secondary | ICD-10-CM | POA: Diagnosis not present

## 2022-01-12 DIAGNOSIS — I25110 Atherosclerotic heart disease of native coronary artery with unstable angina pectoris: Principal | ICD-10-CM | POA: Diagnosis present

## 2022-01-12 DIAGNOSIS — K2100 Gastro-esophageal reflux disease with esophagitis, without bleeding: Secondary | ICD-10-CM | POA: Diagnosis not present

## 2022-01-12 DIAGNOSIS — I10 Essential (primary) hypertension: Secondary | ICD-10-CM | POA: Diagnosis present

## 2022-01-12 DIAGNOSIS — I200 Unstable angina: Secondary | ICD-10-CM

## 2022-01-12 DIAGNOSIS — I4729 Other ventricular tachycardia: Secondary | ICD-10-CM | POA: Diagnosis not present

## 2022-01-12 DIAGNOSIS — I420 Dilated cardiomyopathy: Secondary | ICD-10-CM

## 2022-01-12 DIAGNOSIS — I2583 Coronary atherosclerosis due to lipid rich plaque: Secondary | ICD-10-CM | POA: Diagnosis not present

## 2022-01-12 DIAGNOSIS — I472 Ventricular tachycardia, unspecified: Secondary | ICD-10-CM | POA: Diagnosis present

## 2022-01-12 DIAGNOSIS — I240 Acute coronary thrombosis not resulting in myocardial infarction: Secondary | ICD-10-CM | POA: Diagnosis present

## 2022-01-12 DIAGNOSIS — K219 Gastro-esophageal reflux disease without esophagitis: Secondary | ICD-10-CM | POA: Diagnosis present

## 2022-01-12 DIAGNOSIS — I493 Ventricular premature depolarization: Secondary | ICD-10-CM | POA: Diagnosis not present

## 2022-01-12 DIAGNOSIS — Z20822 Contact with and (suspected) exposure to covid-19: Secondary | ICD-10-CM | POA: Diagnosis present

## 2022-01-12 DIAGNOSIS — I5021 Acute systolic (congestive) heart failure: Secondary | ICD-10-CM | POA: Diagnosis present

## 2022-01-12 DIAGNOSIS — I255 Ischemic cardiomyopathy: Secondary | ICD-10-CM | POA: Diagnosis present

## 2022-01-12 DIAGNOSIS — Z955 Presence of coronary angioplasty implant and graft: Secondary | ICD-10-CM

## 2022-01-12 DIAGNOSIS — E7800 Pure hypercholesterolemia, unspecified: Secondary | ICD-10-CM | POA: Diagnosis present

## 2022-01-12 DIAGNOSIS — R079 Chest pain, unspecified: Secondary | ICD-10-CM | POA: Diagnosis present

## 2022-01-12 DIAGNOSIS — I5020 Unspecified systolic (congestive) heart failure: Secondary | ICD-10-CM | POA: Diagnosis not present

## 2022-01-12 DIAGNOSIS — F17210 Nicotine dependence, cigarettes, uncomplicated: Secondary | ICD-10-CM | POA: Diagnosis present

## 2022-01-12 DIAGNOSIS — N179 Acute kidney failure, unspecified: Secondary | ICD-10-CM | POA: Diagnosis not present

## 2022-01-12 DIAGNOSIS — I130 Hypertensive heart and chronic kidney disease with heart failure and stage 1 through stage 4 chronic kidney disease, or unspecified chronic kidney disease: Secondary | ICD-10-CM | POA: Diagnosis present

## 2022-01-12 DIAGNOSIS — I509 Heart failure, unspecified: Secondary | ICD-10-CM | POA: Diagnosis not present

## 2022-01-12 DIAGNOSIS — N390 Urinary tract infection, site not specified: Secondary | ICD-10-CM | POA: Diagnosis present

## 2022-01-12 DIAGNOSIS — Z7901 Long term (current) use of anticoagulants: Secondary | ICD-10-CM | POA: Diagnosis not present

## 2022-01-12 DIAGNOSIS — R0602 Shortness of breath: Secondary | ICD-10-CM | POA: Diagnosis not present

## 2022-01-12 DIAGNOSIS — I4821 Permanent atrial fibrillation: Secondary | ICD-10-CM | POA: Diagnosis present

## 2022-01-12 DIAGNOSIS — I499 Cardiac arrhythmia, unspecified: Secondary | ICD-10-CM | POA: Diagnosis not present

## 2022-01-12 DIAGNOSIS — D72829 Elevated white blood cell count, unspecified: Secondary | ICD-10-CM | POA: Diagnosis not present

## 2022-01-12 DIAGNOSIS — I129 Hypertensive chronic kidney disease with stage 1 through stage 4 chronic kidney disease, or unspecified chronic kidney disease: Secondary | ICD-10-CM | POA: Diagnosis not present

## 2022-01-12 HISTORY — DX: Heart failure, unspecified: I50.9

## 2022-01-12 NOTE — ED Triage Notes (Signed)
Pt arrives [facility] from home chest tightness and shortness of breath. States pain is mid chest.  Pt was discharged from [facility] today with EF of 15% and is to follow up with cardiology about getting a life vest.
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
## 2022-01-13 ENCOUNTER — Emergency Department (HOSPITAL_COMMUNITY): Payer: Medicare HMO

## 2022-01-13 ENCOUNTER — Encounter (HOSPITAL_COMMUNITY): Payer: Self-pay

## 2022-01-13 ENCOUNTER — Other Ambulatory Visit: Payer: Self-pay

## 2022-01-13 DIAGNOSIS — I200 Unstable angina: Secondary | ICD-10-CM | POA: Diagnosis not present

## 2022-01-13 DIAGNOSIS — K219 Gastro-esophageal reflux disease without esophagitis: Secondary | ICD-10-CM

## 2022-01-13 DIAGNOSIS — N3000 Acute cystitis without hematuria: Secondary | ICD-10-CM

## 2022-01-13 DIAGNOSIS — I10 Essential (primary) hypertension: Secondary | ICD-10-CM | POA: Diagnosis not present

## 2022-01-13 DIAGNOSIS — R0602 Shortness of breath: Secondary | ICD-10-CM | POA: Diagnosis not present

## 2022-01-13 DIAGNOSIS — Z720 Tobacco use: Secondary | ICD-10-CM

## 2022-01-13 DIAGNOSIS — E559 Vitamin D deficiency, unspecified: Secondary | ICD-10-CM | POA: Diagnosis not present

## 2022-01-13 DIAGNOSIS — R079 Chest pain, unspecified: Secondary | ICD-10-CM | POA: Diagnosis not present

## 2022-01-13 DIAGNOSIS — I472 Ventricular tachycardia, unspecified: Secondary | ICD-10-CM | POA: Diagnosis not present

## 2022-01-13 DIAGNOSIS — Z9079 Acquired absence of other genital organ(s): Secondary | ICD-10-CM | POA: Diagnosis not present

## 2022-01-13 DIAGNOSIS — E785 Hyperlipidemia, unspecified: Secondary | ICD-10-CM

## 2022-01-13 DIAGNOSIS — I25110 Atherosclerotic heart disease of native coronary artery with unstable angina pectoris: Secondary | ICD-10-CM | POA: Diagnosis not present

## 2022-01-13 DIAGNOSIS — I255 Ischemic cardiomyopathy: Secondary | ICD-10-CM | POA: Diagnosis not present

## 2022-01-13 DIAGNOSIS — I420 Dilated cardiomyopathy: Secondary | ICD-10-CM | POA: Diagnosis not present

## 2022-01-13 DIAGNOSIS — Z8249 Family history of ischemic heart disease and other diseases of the circulatory system: Secondary | ICD-10-CM | POA: Diagnosis not present

## 2022-01-13 DIAGNOSIS — I4821 Permanent atrial fibrillation: Secondary | ICD-10-CM | POA: Diagnosis not present

## 2022-01-13 DIAGNOSIS — I252 Old myocardial infarction: Secondary | ICD-10-CM | POA: Diagnosis not present

## 2022-01-13 DIAGNOSIS — F17210 Nicotine dependence, cigarettes, uncomplicated: Secondary | ICD-10-CM | POA: Diagnosis not present

## 2022-01-13 DIAGNOSIS — N390 Urinary tract infection, site not specified: Secondary | ICD-10-CM | POA: Diagnosis present

## 2022-01-13 DIAGNOSIS — Z20822 Contact with and (suspected) exposure to covid-19: Secondary | ICD-10-CM | POA: Diagnosis not present

## 2022-01-13 DIAGNOSIS — Z91010 Allergy to peanuts: Secondary | ICD-10-CM | POA: Diagnosis not present

## 2022-01-13 DIAGNOSIS — Z7901 Long term (current) use of anticoagulants: Secondary | ICD-10-CM | POA: Diagnosis not present

## 2022-01-13 DIAGNOSIS — D72829 Elevated white blood cell count, unspecified: Secondary | ICD-10-CM

## 2022-01-13 DIAGNOSIS — N189 Chronic kidney disease, unspecified: Secondary | ICD-10-CM | POA: Diagnosis not present

## 2022-01-13 DIAGNOSIS — I130 Hypertensive heart and chronic kidney disease with heart failure and stage 1 through stage 4 chronic kidney disease, or unspecified chronic kidney disease: Secondary | ICD-10-CM | POA: Diagnosis not present

## 2022-01-13 DIAGNOSIS — E7800 Pure hypercholesterolemia, unspecified: Secondary | ICD-10-CM | POA: Diagnosis not present

## 2022-01-13 DIAGNOSIS — Z79899 Other long term (current) drug therapy: Secondary | ICD-10-CM | POA: Diagnosis not present

## 2022-01-13 DIAGNOSIS — Z955 Presence of coronary angioplasty implant and graft: Secondary | ICD-10-CM | POA: Diagnosis not present

## 2022-01-13 DIAGNOSIS — I5021 Acute systolic (congestive) heart failure: Secondary | ICD-10-CM | POA: Diagnosis not present

## 2022-01-13 LAB — BASIC METABOLIC PANEL
Anion gap: 6 (ref 5–15)
BUN: 25 mg/dL — ABNORMAL HIGH (ref 8–23)
CO2: 29 mmol/L (ref 22–32)
Calcium: 8.2 mg/dL — ABNORMAL LOW (ref 8.9–10.3)
Chloride: 104 mmol/L (ref 98–111)
Creatinine, Ser: 1.52 mg/dL — ABNORMAL HIGH (ref 0.61–1.24)
GFR, Estimated: 48 mL/min — ABNORMAL LOW (ref >60)
Glucose, Bld: 98 mg/dL (ref 70–99)
Potassium: 3.9 mmol/L (ref 3.5–5.1)
Sodium: 139 mmol/L (ref 135–145)

## 2022-01-13 LAB — CBC WITH DIFFERENTIAL/PLATELET
Abs Immature Granulocytes: 0.04 10*3/uL (ref 0.00–0.07)
Basophils Absolute: 0 10*3/uL (ref 0.0–0.1)
Basophils Relative: 0 %
Eosinophils Absolute: 0.2 10*3/uL (ref 0.0–0.5)
Eosinophils Relative: 2 %
HCT: 43.7 % (ref 39.0–52.0)
Hemoglobin: 14.4 g/dL (ref 13.0–17.0)
Immature Granulocytes: 0 %
Lymphocytes Relative: 15 %
Lymphs Abs: 2 10*3/uL (ref 0.7–4.0)
MCH: 31 pg (ref 26.0–34.0)
MCHC: 33 g/dL (ref 30.0–36.0)
MCV: 94.2 fL (ref 80.0–100.0)
Monocytes Absolute: 1.2 10*3/uL — ABNORMAL HIGH (ref 0.1–1.0)
Monocytes Relative: 8 %
Neutro Abs: 10.5 10*3/uL — ABNORMAL HIGH (ref 1.7–7.7)
Neutrophils Relative %: 75 %
Platelets: 192 10*3/uL (ref 150–400)
RBC: 4.64 MIL/uL (ref 4.22–5.81)
RDW: 15 % (ref 11.5–15.5)
WBC: 14 10*3/uL — ABNORMAL HIGH (ref 4.0–10.5)
nRBC: 0 % (ref 0.0–0.2)

## 2022-01-13 LAB — RESP PANEL BY RT-PCR (FLU A&B, COVID) ARPGX2
Influenza A by PCR: NEGATIVE
Influenza B by PCR: NEGATIVE
SARS Coronavirus 2 by RT PCR: NEGATIVE

## 2022-01-13 LAB — TROPONIN I (HIGH SENSITIVITY)
Troponin I (High Sensitivity): 12 ng/L (ref <18)
Troponin I (High Sensitivity): 11 ng/L (ref <18)

## 2022-01-13 LAB — BRAIN NATRIURETIC PEPTIDE: B Natriuretic Peptide: 905 pg/mL — ABNORMAL HIGH (ref 0.0–100.0)

## 2022-01-13 LAB — MAGNESIUM: Magnesium: 1.9 mg/dL (ref 1.7–2.4)

## 2022-01-13 MED ORDER — ATORVASTATIN CALCIUM 40 MG PO TABS
80.0000 mg | ORAL_TABLET | Freq: Every day | ORAL | Status: DC
  Administered 2022-01-13: 80 mg via ORAL
  Filled 2022-01-13: qty 2

## 2022-01-13 MED ORDER — AMLODIPINE BESYLATE 5 MG PO TABS
5.0000 mg | ORAL_TABLET | Freq: Every day | ORAL | Status: DC
  Administered 2022-01-13: 5 mg via ORAL
  Filled 2022-01-13: qty 1

## 2022-01-13 MED ORDER — BACLOFEN 5 MG HALF TABLET: 5.0000 mg | ORAL_TABLET | Freq: Three times a day (TID) | ORAL | Status: DC | PRN

## 2022-01-13 MED ORDER — APIXABAN 5 MG PO TABS
5.0000 mg | ORAL_TABLET | Freq: Two times a day (BID) | ORAL | Status: DC
Start: 2022-01-13 — End: 2022-01-14
  Administered 2022-01-13 – 2022-01-14 (×3): 5 mg via ORAL
  Filled 2022-01-13 (×3): qty 1

## 2022-01-13 MED ORDER — ACETAMINOPHEN 325 MG PO TABS: 650.0000 mg | ORAL_TABLET | ORAL | Status: DC | PRN

## 2022-01-13 MED ORDER — METOPROLOL SUCCINATE ER 25 MG PO TB24
25.0000 mg | ORAL_TABLET | Freq: Every day | ORAL | Status: DC
  Administered 2022-01-14 – 2022-01-16 (×3): 25 mg via ORAL
  Filled 2022-01-13 (×3): qty 1

## 2022-01-13 MED ORDER — ATORVASTATIN CALCIUM 40 MG PO TABS
40.0000 mg | ORAL_TABLET | Freq: Every evening | ORAL | Status: DC
  Administered 2022-01-14 – 2022-01-15 (×2): 40 mg via ORAL
  Filled 2022-01-13 (×2): qty 1

## 2022-01-13 MED ORDER — METOPROLOL TARTRATE 25 MG PO TABS
25.0000 mg | ORAL_TABLET | Freq: Two times a day (BID) | ORAL | Status: AC
  Administered 2022-01-13 (×2): 25 mg via ORAL
  Filled 2022-01-13 (×2): qty 1

## 2022-01-13 MED ORDER — LOSARTAN POTASSIUM 25 MG PO TABS
25.0000 mg | ORAL_TABLET | Freq: Every day | ORAL | Status: DC
  Administered 2022-01-14: 25 mg via ORAL
  Filled 2022-01-13: qty 1

## 2022-01-13 MED ORDER — CEPHALEXIN 250 MG PO CAPS
500.0000 mg | ORAL_CAPSULE | Freq: Three times a day (TID) | ORAL | Status: AC
  Administered 2022-01-13 – 2022-01-15 (×9): 500 mg via ORAL
  Filled 2022-01-13 (×6): qty 2
  Filled 2022-01-13: qty 1
  Filled 2022-01-13: qty 2
  Filled 2022-01-13: qty 1

## 2022-01-13 MED ORDER — PANTOPRAZOLE SODIUM 40 MG PO TBEC
40.0000 mg | DELAYED_RELEASE_TABLET | Freq: Every day | ORAL | Status: DC
  Administered 2022-01-13 – 2022-01-17 (×4): 40 mg via ORAL
  Filled 2022-01-13 (×4): qty 1

## 2022-01-13 MED ORDER — LISINOPRIL 10 MG PO TABS
20.0000 mg | ORAL_TABLET | Freq: Every day | ORAL | Status: DC
  Administered 2022-01-13: 20 mg via ORAL
  Filled 2022-01-13: qty 2

## 2022-01-13 MED ORDER — NICOTINE 21 MG/24HR TD PT24
21.0000 mg | MEDICATED_PATCH | Freq: Every day | TRANSDERMAL | Status: DC
  Filled 2022-01-13 (×3): qty 1

## 2022-01-13 MED ORDER — ASPIRIN EC 81 MG PO TBEC
81.0000 mg | DELAYED_RELEASE_TABLET | Freq: Every day | ORAL | Status: DC
  Administered 2022-01-14 – 2022-01-17 (×3): 81 mg via ORAL
  Filled 2022-01-13 (×3): qty 1

## 2022-01-13 MED ORDER — ONDANSETRON HCL 4 MG/2ML IJ SOLN: 4.0000 mg | Freq: Four times a day (QID) | INTRAMUSCULAR | Status: DC | PRN

## 2022-01-13 NOTE — Assessment & Plan Note (Addendum)
-   He states he has had dysuria for 3 wks and Keflex is the 3rd course of antibiotics as the other 2 did not work - he has no dysuria now - 5 days was completed on 01/15/2022

## 2022-01-13 NOTE — Assessment & Plan Note (Addendum)
Advised on the importance of cessation Cont nicotine patch

## 2022-01-13 NOTE — Progress Notes (Signed)
Pt arrived to the unit. Vitals and weight taken. Pt comfortable in bed. [facility] notified of pts arrival.
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
## 2022-01-13 NOTE — Assessment & Plan Note (Addendum)
-   rate controlled- Eliquis held for cath - Continue metoprolol

## 2022-01-13 NOTE — Hospital Course (Addendum)
76 y.o. male with medical history significant of with history of atrial fibrillation, CHF with most recent ejection fraction showing 15%, coronary artery disease, GERD, ischemic cardiomyopathy, tobacco use disorder, and more presented to the [facility] ED on 2/18, on the same day he was discharged from [facility], with a chief complaint of shortness of breath and chest pain.    He was admitted at [facility] for chest pain. Stress test on 2/16 negative. Per phone conversation, Dr Harl Bowie, recommended Losartan, Toprol (stop Amlodipine, Bisoprolol, Levaquin, Lisinopril) and outpt f/u. He was also discharged on keflex for dysuria.   He had taken 1 dose of each of his new medications, Eliquis, Keflex, metoprolol since being home. Patient reports that at night he woke up out of sleep feeling shortness of breath with chest pain on the left side of his chest.  All symptoms resolved with nitro.    He was admitted to [facility] on 2/19 and transferred from [facility] to [facility] for a cardiology eval.

## 2022-01-13 NOTE — Assessment & Plan Note (Signed)
Continue Protonix °

## 2022-01-13 NOTE — Assessment & Plan Note (Addendum)
NSVT -Last echo showed an ejection fraction of 15% compared to 50%   - - cont b blocker LHC performed on 01/16/2022 demonstrated  40% mLAD, patent mid LCx stent with 20% ISR, moderate diffuse dz in OM1 and OM2, 70% proximal and 65% mid to dRCA, normal LVEDP and PCWP with mixed venous O2 72%.  -medical management - will need life vest on dc

## 2022-01-13 NOTE — ED Notes (Signed)
[facility] called to set up transportation at this time. 

## 2022-01-13 NOTE — Assessment & Plan Note (Signed)
Possibly reactive, no s/s of infection found Recheck CBC in AM

## 2022-01-13 NOTE — Assessment & Plan Note (Addendum)
Continue atorvastatin 80 mg daily. 

## 2022-01-13 NOTE — ED Notes (Signed)
Breakfast tray given at this time.  

## 2022-01-13 NOTE — H&P (Signed)
History and Physical    Patient: Ronald Mcdowell NOT:[id number] DOB: [birth year] DOA: 01/12/2022 DOS: the patient was seen and examined on 01/13/2022 PCP: Neale Burly, MD  Patient coming from: Home  Chief Complaint:  Chief Complaint  Patient presents with   Shortness of Breath    HPI: Ronald Mcdowell is a 76 y.o. male with medical history significant of with history of atrial fibrillation, CHF with most recent ejection fraction showing 15%, coronary artery disease, GERD, ischemic cardiomyopathy, tobacco use disorder, and more presents ED with a chief complaint of shortness of breath and chest pain.  Patient reports that he was discharged from [facility] yesterday morning.  He had taken 1 dose of each of his new medications, Eliquis, Keflex, metoprolol since being home.  Patient reports that yesterday night he woke up out of sleep feeling shortness of breath.  It was sudden in onset.  Is associated with chest pain on the left side of his chest that felt like a tightening.  Patient does admit to palpitations.  All symptoms resolved with nitro.  He did develop a headache after taking nitro, but by that time EMS was there.  Patient reports is the first time that he is taking nitro in the 23 years since he had a heart attack.  He did have a stent placed 23 years ago.  Patient denies any inhaler or nebulizer use at home.  His cardiologist is Dr. Harl Bowie.  Patient reports that he does have follow-up with Dr. Harl Bowie in 2 days.  On another note, patient reports dysuria and suprapubic pressure.  He reports he was diagnosed with UTI and has been on antibiotic for 2 days.  He was prescribed a 5-day course of an antibiotic that he take 3 times a day.  He is not sure the name of it.  Patient has no other complaints at this time.  Patient is a current smoker, does not drink alcohol or use illicit drugs.  He is vaccinated for COVID.  Patient is full code.  Review of Systems: As mentioned in the history of  present illness. All other systems reviewed and are negative. Past Medical History:  Diagnosis Date   Anemia    Atrial fibrillation (HCC)    CHF (congestive heart failure) (HCC)    Chronic kidney disease, unspecified 01/14/2020   Coronary artery disease    a. h/o MI in 2000 w/ prior LCX stenting;  b. 7.2014 Abnl Cardiolite, EF 41%, mod-large inferolat scar;  c. 07/2013 Cath/PCI: LM nl, LAD 10-20, D1 40-50p, LCX 95-99 @ distal stent margin (2.5x20 Promus Premier DES), RCA dom, 40p, 5m, 70d, EF 35-40%.   Elevated cholesterol    GERD (gastroesophageal reflux disease)    Graves disease 04/2020   s/p I-131 thyroid ablation    Hemorrhoids    Hypertension    Hyperthyroidism 09/27/2020   Ischemic cardiomyopathy    a. 07/2013 EF 35-40% by LV gram.; EF 50% on ECHO in November 2020   Myocardial infarction [facility])    Reflux esophagitis    Spondylosis of cervical joint    C3-4, C6-7   Tobacco user    Vitamin D deficiency disease 08/24/2019   Past Surgical History:  Procedure Laterality Date   BIOPSY  01/09/2018   Procedure: BIOPSY;  Surgeon: Daneil Dolin, MD;  [facility];  Service: Endoscopy;;  esophageal biopsies   CARDIAC CATHETERIZATION     COLONOSCOPY     COLONOSCOPY N/A 01/25/2016   RMR: normal  ileocolonoscopy ( anal canal hemorrhoids)   CORONARY ANGIOPLASTY WITH STENT PLACEMENT  08/23/2013   mid circumflex  DES    by Dr Martinique   CORONARY STENT PLACEMENT  2000   ESOPHAGOGASTRODUODENOSCOPY N/A 01/25/2016   RMR: Reflux esophagitits. Cervical web with passage of the scope. Status post esophageal biosy. Hiatal hernia.    ESOPHAGOGASTRODUODENOSCOPY N/A 01/09/2018   Rourk: cervical/proximal esophageal web s/p dilation, markedly abnormal esophagus c/w biopsy proven eosinophilic esophagitis. small hiatal hernia.   ESOPHAGOGASTRODUODENOSCOPY N/A 05/31/2020   Procedure: ESOPHAGOGASTRODUODENOSCOPY (EGD);  Surgeon: Daneil Dolin, MD;  [facility];  Service: Endoscopy;   Laterality: N/A;  8:30am   LEFT HEART CATHETERIZATION WITH CORONARY ANGIOGRAM N/A 08/23/2013   Procedure: LEFT HEART CATHETERIZATION WITH CORONARY ANGIOGRAM;  Surgeon: Peter M Martinique, MD;  Location: [facility] CATH LAB;  Service: Cardiovascular;  Laterality: N/A;   MALONEY DILATION N/A 01/09/2018   Procedure: [name] DILATION;  Surgeon: Daneil Dolin, MD;  Location[facility];  Service: Endoscopy;  Laterality: N/A;   MALONEY DILATION N/A 05/31/2020   Procedure: [name] DILATION;  Surgeon: Daneil Dolin, MD;  Location[facility];  Service: Endoscopy;  Laterality: N/A;   TRANSURETHRAL RESECTION OF PROSTATE  01/09/2012   Procedure: TRANSURETHRAL RESECTION OF THE PROSTATE (TURP);  Surgeon: Marissa Nestle, MD;  Location: [facility] ORS;  Service: Urology;  Laterality: N/A;   Social History:  reports that he has been smoking cigarettes. He started smoking about 56 years ago. He has a 53.00 pack-year smoking history. He has never used smokeless tobacco. He reports that he does not drink alcohol and does not use drugs.  Allergies  Allergen Reactions   Peanut Butter Flavor Swelling    Family History  Problem Relation Age of Onset   Early death Mother        childbirth   Heart attack Father    Cancer Father    Heart disease Brother        CABG   Heart disease Brother        slight heart attack   Anesthesia problems Neg Hx    Hypotension Neg Hx    Malignant hyperthermia Neg Hx    Pseudochol deficiency Neg Hx    Colon cancer Neg Hx    Liver disease Neg Hx    Gastric cancer Neg Hx    Esophageal cancer Neg Hx     Prior to Admission medications   Medication Sig Start Date End Date Taking? Authorizing Provider  amLODipine (NORVASC) 5 MG tablet Take 1 tablet (5 mg total) by mouth daily. 10/09/21   Arnoldo Lenis, MD  apixaban (ELIQUIS) 5 MG TABS tablet TAKE 1 TABLET(5 MG) BY MOUTH TWICE DAILY 01/02/21   Hurshel Party C, MD  atorvastatin (LIPITOR) 80 MG tablet Take 1 tablet (80 mg total) by  mouth daily. 10/09/21   Arnoldo Lenis, MD  bisoprolol (ZEBETA) 5 MG tablet Take 1 tablet (5 mg total) by mouth daily. 10/09/21 01/07/22  Arnoldo Lenis, MD  Cholecalciferol (VITAMIN D-3) 125 MCG (5000 UT) TABS Take 1 tablet by mouth daily.    [provider]  ketoconazole (NIZORAL) 2 % cream SMARTSIG:1 Sparingly Topical Twice Daily PRN 09/27/20   [provider]  lisinopril (ZESTRIL) 20 MG tablet Take 1 tablet (20 mg total) by mouth daily. 10/09/21 01/07/22  Arnoldo Lenis, MD  nitroGLYCERIN (NITROSTAT) 0.4 MG SL tablet Place 1 tablet (0.4 mg total) under the tongue every 5 (five) minutes as needed  for chest pain. 12/04/20   Doree Albee, MD  pantoprazole (PROTONIX) 40 MG tablet Take 1 tablet (40 mg total) by mouth daily. 10/09/21   Arnoldo Lenis, MD  potassium chloride SA (KLOR-CON) 20 MEQ tablet TAKE 1 TABLET EVERY DAY 05/29/21   Ailene Ards, NP    Physical Exam: Vitals:   01/13/22 0030 01/13/22 0100 01/13/22 0200 01/13/22 0500  BP: 133/68 (!) 111/94 (!) 117/94 112/87  Pulse: (!) 103 95 94 68  Resp: 19 (!) 21 20 (!) 21  Temp:      TempSrc:      SpO2: 100% 100% 100% 100%  Weight:      Height:       1.  General: Patient lying supine in bed,  no acute distress   2. Psychiatric: Alert and oriented x 3, mood and behavior normal for situation, pleasant and cooperative with exam   3. Neurologic: Speech and language are normal, face is symmetric, moves all 4 extremities voluntarily, at baseline without acute deficits on limited exam   4. HEENMT:  Head is atraumatic, normocephalic, pupils reactive to light, neck is supple, trachea is midline, mucous membranes are moist   5. Respiratory : Lungs are clear to auscultation bilaterally without wheezing, rhonchi, rales, no cyanosis, no increase in work of breathing or accessory muscle use   6. Cardiovascular : Heart rate normal, rhythm is irregularly irregular, no murmurs, rubs or gallops, no peripheral  edema, peripheral pulses palpated   7. Gastrointestinal:  Abdomen is soft, nondistended, nontender to palpation bowel sounds active, no masses or organomegaly palpated   8. Skin:  Skin is warm, dry and intact without rashes, acute lesions, or ulcers on limited exam   9.Musculoskeletal:  No acute deformities or trauma, no asymmetry in tone, no peripheral edema, peripheral pulses palpated, no tenderness to palpation in the extremities   Data Reviewed: In the ED Temp 97.8, heart rate 93-103, respiratory rate 19-22, blood pressure 111/94, satting at 100% on room air Slightly cytosis 14.0-likely related to UTI Chemistry panel is mostly unremarkable with a creatinine at baseline 1.5 to BNP 905 Troponin 12, 11 Chest x-ray shows no acute disease EKG shows a heart rate of 100, A-fib, QTc 457  Patient did have 15 beats of a wide-complex tachycardia in the ED, most likely A-fib with aberrancy Patient does have a history of coronary artery disease.  He was recently admitted for unstable angina at [facility].  They did not have cardiology.  They did a nuclear medicine study that showed no reversible ischemia.  He did show an ejection fraction of 15% which is down from 50% in 2020.  His cardiologist did call him since he has been discharging discussed possibility of LifeVest versus ICD.  Patient reports that he is taken 1 dose of his 3 new medications since being home.  3 medications are metoprolol, antibiotic for UTI that is likely Keflex, and Eliquis.  He has scheduled follow-up with Dr. Harl Bowie in 2 days. Assessment and Plan: UTI (urinary tract infection)- (present on admission) Patient diagnosed with UTI recent hospitalization with BUN/Cr He reports 3 days left of his antibiotic course He is not sure of his antibiotic but reports he takes it 3 times a day, most likely Keflex Continue Keflex Check UA  Chest pain- (present on admission) Patient presents with chest pain shortness of breath At  [facility] he was noted to possibly have V. tach episodes Here he has 15 beat wide complex tachycardia caught  on telemetry -V. tach versus atrial fibrillation with aberrancy Patient was symptomatic with his tachycardia, and was having chest pain prior to the tachycardia anyways.  All symptoms resolved with nitro Admitting to monitor on telemetry and for cardiology consult -as there was no cardiologist at [facility] to consult on this patient Transferring to [facility] for cardiology Initial troponin 12, next 11 Chest x-ray shows no active disease BNP 905-patient not clinically fluid overloaded Monitor on telemetry Repeat EKG for any chest pain Repeat troponin for any chest pain   Tobacco use- (present on admission) Patient advised on the importance of cessation Patient request nicotine patch  Hyperlipidemia- (present on admission) Continue statin  Permanent atrial fibrillation ([place])- (present on admission) Patient in permanent atrial fibrillation He believes this to be a new diagnosis since his last admission at [place] metoprolol Patient's cardiologist is Dr. Harl Bowie -the patient has not moved to [facility] by a.m. May consider reaching out to Dr. Harl Bowie for curbside recommendations Continue to monitor  Ischemic heart disease due to coronary artery obstruction ([place])- (present on admission) Last echo showed an ejection fraction of 15% This is down from 50% on the last echo Dr. Harl Bowie has called him and already discussed possible LifeVest versus pacemaker Consult cardiology appreciate recs Nuc med study done at [facility] showed no reversible pathology Continue to monitor  GERD (gastroesophageal reflux disease)- (present on admission) Continue Protonix  HTN (hypertension)- (present on admission) Continue lisinopril and amlodipine       Advance Care Planning:   Code Status: Prior full  Consults: Cardiology  Family Communication: No family at  bedside  Severity of Illness: The appropriate patient status for this patient is OBSERVATION. Observation status is judged to be reasonable and necessary in order to provide the required intensity of service to ensure the patient's safety. The patient's presenting symptoms, physical exam findings, and initial radiographic and laboratory data in the context of their medical condition is felt to place them at decreased risk for further clinical deterioration. Furthermore, it is anticipated that the patient will be medically stable for discharge from the hospital within 2 midnights of admission.   Author: Rolla Plate, DO 01/13/2022 5:57 AM  For on call review [email or web address].

## 2022-01-13 NOTE — Assessment & Plan Note (Deleted)
Patient presents with chest pain shortness of breath At [facility] ER he was noted to possibly have V. tach episodes Here he has 15 beat wide complex tachycardia caught on telemetry -V. tach versus atrial fibrillation with aberrancy Patient was symptomatic with his tachycardia, and was having chest pain prior to the tachycardia anyways.  All symptoms resolved with nitro Admitting to monitor on telemetry and for cardiology consult -as there was no cardiologist at [facility] to consult on this patient Transferring to [facility] for [facility]ology Initial troponin 12, next 11 Chest x-ray shows no active disease BNP 905-patient not clinically fluid overloaded Monitor on telemetry Repeat EKG for any chest pain Repeat troponin for any chest pain

## 2022-01-14 ENCOUNTER — Observation Stay (HOSPITAL_COMMUNITY): Payer: Medicare HMO

## 2022-01-14 DIAGNOSIS — I259 Chronic ischemic heart disease, unspecified: Secondary | ICD-10-CM

## 2022-01-14 DIAGNOSIS — Z8249 Family history of ischemic heart disease and other diseases of the circulatory system: Secondary | ICD-10-CM | POA: Diagnosis not present

## 2022-01-14 DIAGNOSIS — I130 Hypertensive heart and chronic kidney disease with heart failure and stage 1 through stage 4 chronic kidney disease, or unspecified chronic kidney disease: Secondary | ICD-10-CM | POA: Diagnosis present

## 2022-01-14 DIAGNOSIS — I240 Acute coronary thrombosis not resulting in myocardial infarction: Secondary | ICD-10-CM

## 2022-01-14 DIAGNOSIS — I2510 Atherosclerotic heart disease of native coronary artery without angina pectoris: Secondary | ICD-10-CM | POA: Diagnosis not present

## 2022-01-14 DIAGNOSIS — K219 Gastro-esophageal reflux disease without esophagitis: Secondary | ICD-10-CM | POA: Diagnosis present

## 2022-01-14 DIAGNOSIS — I420 Dilated cardiomyopathy: Secondary | ICD-10-CM

## 2022-01-14 DIAGNOSIS — E7800 Pure hypercholesterolemia, unspecified: Secondary | ICD-10-CM | POA: Diagnosis present

## 2022-01-14 DIAGNOSIS — F17210 Nicotine dependence, cigarettes, uncomplicated: Secondary | ICD-10-CM | POA: Diagnosis present

## 2022-01-14 DIAGNOSIS — Z720 Tobacco use: Secondary | ICD-10-CM | POA: Diagnosis not present

## 2022-01-14 DIAGNOSIS — Z79899 Other long term (current) drug therapy: Secondary | ICD-10-CM | POA: Diagnosis not present

## 2022-01-14 DIAGNOSIS — Z91010 Allergy to peanuts: Secondary | ICD-10-CM | POA: Diagnosis not present

## 2022-01-14 DIAGNOSIS — I4821 Permanent atrial fibrillation: Secondary | ICD-10-CM

## 2022-01-14 DIAGNOSIS — I200 Unstable angina: Secondary | ICD-10-CM | POA: Diagnosis not present

## 2022-01-14 DIAGNOSIS — Z20822 Contact with and (suspected) exposure to covid-19: Secondary | ICD-10-CM | POA: Diagnosis present

## 2022-01-14 DIAGNOSIS — N390 Urinary tract infection, site not specified: Secondary | ICD-10-CM | POA: Diagnosis not present

## 2022-01-14 DIAGNOSIS — I5021 Acute systolic (congestive) heart failure: Secondary | ICD-10-CM | POA: Diagnosis present

## 2022-01-14 DIAGNOSIS — I4729 Other ventricular tachycardia: Secondary | ICD-10-CM

## 2022-01-14 DIAGNOSIS — Z9079 Acquired absence of other genital organ(s): Secondary | ICD-10-CM | POA: Diagnosis not present

## 2022-01-14 DIAGNOSIS — I4891 Unspecified atrial fibrillation: Secondary | ICD-10-CM

## 2022-01-14 DIAGNOSIS — E559 Vitamin D deficiency, unspecified: Secondary | ICD-10-CM | POA: Diagnosis present

## 2022-01-14 DIAGNOSIS — I255 Ischemic cardiomyopathy: Secondary | ICD-10-CM | POA: Diagnosis not present

## 2022-01-14 DIAGNOSIS — I2583 Coronary atherosclerosis due to lipid rich plaque: Secondary | ICD-10-CM

## 2022-01-14 DIAGNOSIS — I472 Ventricular tachycardia, unspecified: Secondary | ICD-10-CM

## 2022-01-14 DIAGNOSIS — R072 Precordial pain: Secondary | ICD-10-CM | POA: Diagnosis not present

## 2022-01-14 DIAGNOSIS — I5020 Unspecified systolic (congestive) heart failure: Secondary | ICD-10-CM | POA: Diagnosis not present

## 2022-01-14 DIAGNOSIS — I252 Old myocardial infarction: Secondary | ICD-10-CM | POA: Diagnosis not present

## 2022-01-14 DIAGNOSIS — I25110 Atherosclerotic heart disease of native coronary artery with unstable angina pectoris: Secondary | ICD-10-CM | POA: Diagnosis not present

## 2022-01-14 DIAGNOSIS — I509 Heart failure, unspecified: Secondary | ICD-10-CM | POA: Diagnosis not present

## 2022-01-14 DIAGNOSIS — N189 Chronic kidney disease, unspecified: Secondary | ICD-10-CM | POA: Diagnosis present

## 2022-01-14 DIAGNOSIS — N3000 Acute cystitis without hematuria: Secondary | ICD-10-CM | POA: Diagnosis not present

## 2022-01-14 DIAGNOSIS — Z7901 Long term (current) use of anticoagulants: Secondary | ICD-10-CM | POA: Diagnosis not present

## 2022-01-14 DIAGNOSIS — Z955 Presence of coronary angioplasty implant and graft: Secondary | ICD-10-CM | POA: Diagnosis not present

## 2022-01-14 DIAGNOSIS — R079 Chest pain, unspecified: Secondary | ICD-10-CM | POA: Diagnosis present

## 2022-01-14 LAB — BASIC METABOLIC PANEL
Anion gap: 7 (ref 5–15)
BUN: 25 mg/dL — ABNORMAL HIGH (ref 8–23)
CO2: 28 mmol/L (ref 22–32)
Calcium: 7.9 mg/dL — ABNORMAL LOW (ref 8.9–10.3)
Chloride: 104 mmol/L (ref 98–111)
Creatinine, Ser: 1.48 mg/dL — ABNORMAL HIGH (ref 0.61–1.24)
GFR, Estimated: 49 mL/min — ABNORMAL LOW (ref >60)
Glucose, Bld: 93 mg/dL (ref 70–99)
Potassium: 4.3 mmol/L (ref 3.5–5.1)
Sodium: 139 mmol/L (ref 135–145)

## 2022-01-14 LAB — CBC WITH DIFFERENTIAL/PLATELET
Abs Immature Granulocytes: 0.06 10*3/uL (ref 0.00–0.07)
Basophils Absolute: 0 10*3/uL (ref 0.0–0.1)
Basophils Relative: 0 %
Eosinophils Absolute: 0.3 10*3/uL (ref 0.0–0.5)
Eosinophils Relative: 2 %
HCT: 41.8 % (ref 39.0–52.0)
Hemoglobin: 13.9 g/dL (ref 13.0–17.0)
Immature Granulocytes: 0 %
Lymphocytes Relative: 12 %
Lymphs Abs: 1.7 10*3/uL (ref 0.7–4.0)
MCH: 30.8 pg (ref 26.0–34.0)
MCHC: 33.3 g/dL (ref 30.0–36.0)
MCV: 92.7 fL (ref 80.0–100.0)
Monocytes Absolute: 1.4 10*3/uL — ABNORMAL HIGH (ref 0.1–1.0)
Monocytes Relative: 9 %
Neutro Abs: 11.7 10*3/uL — ABNORMAL HIGH (ref 1.7–7.7)
Neutrophils Relative %: 77 %
Platelets: 165 10*3/uL (ref 150–400)
RBC: 4.51 MIL/uL (ref 4.22–5.81)
RDW: 15.1 % (ref 11.5–15.5)
WBC: 15.2 10*3/uL — ABNORMAL HIGH (ref 4.0–10.5)
nRBC: 0 % (ref 0.0–0.2)

## 2022-01-14 LAB — URINALYSIS, MICROSCOPIC (REFLEX)

## 2022-01-14 LAB — ECHOCARDIOGRAM COMPLETE
Height: 65 in
S' Lateral: 5.9 cm
Weight: 2227.2 oz

## 2022-01-14 LAB — LIPID PANEL
Cholesterol: 109 mg/dL (ref 0–200)
HDL: 28 mg/dL — ABNORMAL LOW (ref >40)
LDL Cholesterol: 69 mg/dL (ref 0–99)
Total CHOL/HDL Ratio: 3.9 RATIO
Triglycerides: 59 mg/dL (ref <150)
VLDL: 12 mg/dL (ref 0–40)

## 2022-01-14 LAB — URINALYSIS, ROUTINE W REFLEX MICROSCOPIC
Bilirubin Urine: NEGATIVE
Glucose, UA: NEGATIVE mg/dL
Ketones, ur: NEGATIVE mg/dL
Nitrite: NEGATIVE
Protein, ur: NEGATIVE mg/dL
Specific Gravity, Urine: 1.01 (ref 1.005–1.030)
pH: 6 (ref 5.0–8.0)

## 2022-01-14 LAB — MAGNESIUM: Magnesium: 1.8 mg/dL (ref 1.7–2.4)

## 2022-01-14 MED ORDER — MAGNESIUM SULFATE 2 GM/50ML IV SOLN
2.0000 g | Freq: Once | INTRAVENOUS | Status: AC
  Administered 2022-01-14: 2 g via INTRAVENOUS
  Filled 2022-01-14: qty 50

## 2022-01-14 MED ORDER — HEPARIN (PORCINE) 25000 UT/250ML-% IV SOLN
900.0000 [IU]/h | INTRAVENOUS | Status: DC
  Administered 2022-01-14: 21:00:00 850 [IU]/h via INTRAVENOUS
  Administered 2022-01-15: 1000 [IU]/h via INTRAVENOUS
  Filled 2022-01-14 (×2): qty 250

## 2022-01-14 NOTE — Progress Notes (Signed)
ANTICOAGULATION CONSULT NOTE - Initial Consult  Pharmacy Consult for heparin Indication: chest pain/ACS  Allergies  Allergen Reactions   Peanut Butter Flavor Swelling    Patient Measurements: Height: 5\' 5"  (165.1 cm) Weight: 63.1 kg (139 lb 3.2 oz) (scale a) IBW/kg (Calculated) : 61.5 Heparin Dosing Weight: 62 kg  Vital Signs: Temp: 98.1 F (36.7 C) (02/20 0834) Temp Source: Oral (02/20 0409) BP: 115/85 (02/20 0834) Pulse Rate: 96 (02/20 0409)  Labs: Recent Labs    01/13/22 0041 01/13/22 0217 01/14/22 0400  HGB 14.4  --  13.9  HCT 43.7  --  41.8  PLT 192  --  165  CREATININE 1.52*  --  1.48*  TROPONINIHS 12 11  --     Estimated Creatinine Clearance: 38.1 mL/min (A) (by C-G formula based on SCr of 1.48 mg/dL (H)).   Medical History: Past Medical History:  Diagnosis Date   Anemia    Atrial fibrillation (HCC)    CHF (congestive heart failure) ([place])    Chronic kidney disease, unspecified 01/14/2020   Coronary artery disease    a. h/o MI in 2000 w/ prior LCX stenting;  b. 7.2014 Abnl Cardiolite, EF 41%, mod-large inferolat scar;  c. 07/2013 Cath/PCI: LM nl, LAD 10-20, D1 40-50p, LCX 95-99 @ distal stent margin (2.5x20 Promus Premier DES), RCA dom, 40p, 40m, 70d, EF 35-40%.   Elevated cholesterol    GERD (gastroesophageal reflux disease)    Graves disease 04/2020   s/p I-131 thyroid ablation    Hemorrhoids    Hypertension    Hyperthyroidism 09/27/2020   Ischemic cardiomyopathy    a. 07/2013 EF 35-40% by LV gram.; EF 50% on ECHO in November 2020   Myocardial infarction [facility])    Reflux esophagitis    Spondylosis of cervical joint    C3-4, C6-7   Tobacco user    Vitamin D deficiency disease 08/24/2019     Assessment: 76 yo M on apixaban PTA for atrial fibrillation. Last dose 2/20 at 0830. Admitted for chest pain with complex cardiac history. Pharmacy consulted for heparin.   ClCr ~38 ml/min. Will start heparin when next apixaban is due.    Goal of Therapy:   Heparin level 0.3-0.7 units/ml Monitor platelets by anticoagulation protocol: Yes   Plan:  Heparin 850 units/hr, no bolus  F/u aPTT until correlates with heparin level  F/u 8 hr aPTT/heparin level  Monitor daily aPTT, heparin level, CBC Monitor for signs/symptoms of bleeding    Benetta Spar, PharmD, BCPS, BCCP Clinical Pharmacist  Please check AMION for all [place] phone numbers After 10:00 PM, call [place]

## 2022-01-14 NOTE — TOC Progression Note (Signed)
Transition of Care [facility]) - Progression Note    Patient Details  Name: WILVER TIGNOR MRN: [id number] Date of Birth: [birth year]  Transition of Care [facility]) CM/SW Contact  Zenon Mayo, RN Phone Number: 01/14/2022, 12:04 PM  Clinical Narrative:    From home with wife, Moon given. EF of 15%, Cards Consult, may have a cath or heart MRI, waiting for cards, indep.  On eliquis pta.  TOC will continue to follow for dc needs.        Expected Discharge Plan and Services                                                 Social Determinants of Health (SDOH) Interventions    Readmission Risk Interventions No flowsheet data found.

## 2022-01-14 NOTE — Assessment & Plan Note (Addendum)
-   has h/o prior infarcts and a stent - stress test neg on 2/16 at [facility] for reversible ischemia, there were fixed defects - Cardiac cath was performed 01/16/2022. It demonstrated  40% mLAD, patent mid LCx stent with 20% ISR, moderate diffuse dz in OM1 and OM2, 70% proximal and 65% mid to dRCA, normal LVEDP and PCWP with mixed venous O2 72%.  -Recommendation was medical management.  -Patient will be discharged with Life Vest

## 2022-01-14 NOTE — Progress Notes (Signed)
Progress Note   Patient: Ronald Mcdowell SFK:[id number] DOB: 08/21/1946 DOA: 01/12/2022     0 DOS: the patient was seen and examined on 01/14/2022   Brief hospital course: 76 y.o. male with medical history significant of with history of atrial fibrillation, CHF with most recent ejection fraction showing 15%, coronary artery disease, GERD, ischemic cardiomyopathy, tobacco use disorder, and more presented to the [facility] ED on 2/18, on the same day he was discharged from [facility], with a chief complaint of shortness of breath and chest pain.    He was admitted at [facility] for chest pain. Stress test on 2/16 negative. Per phone conversation, Dr Harl Bowie, recommended Losartan, Toprol (stop Amlodipine, Bisoprolol, Levaquin, Lisinopril) and outpt f/u. He was also discharged on keflex for dysuria.   He had taken 1 dose of each of his new medications, Eliquis, Keflex, metoprolol since being home. Patient reports that at night he woke up out of sleep feeling shortness of breath with chest pain on the left side of his chest.  All symptoms resolved with nitro.    He was admitted to [facility] on 2/19 and transferred from [facility] to [facility] for a cardiology eval.    Assessment and Plan: * Chest pain- (present on admission) - has h/o prior infarcts and a stent - stress test neg on 2/18 - awaiting cardiac eval  Ischemic heart disease due to coronary artery obstruction ([place])- (present on admission) NSVT -Last echo showed an ejection fraction of 15% compared to 50%   - - cont b blocker - give IV mag to get Mg level > 2 - awaiting cardiac eval    UTI (urinary tract infection)- (present on admission) - He states he has had dysuria for 3 wks and Keflex is the 3rd course of antibiotics as the other 2 did not work - he has no dysuria today - 5 days will be complete on 2/21  Tobacco use- (present on admission) Advised on the importance of cessation Cont nicotine patch  Hyperlipidemia- (present on  admission) Continue statin  Permanent atrial fibrillation ([place])- (present on admission) Patient in permanent atrial fibrillation He believes this to be a new diagnosis since his last admission at [place] Continue metoprolol Continue to monitor  GERD (gastroesophageal reflux disease)- (present on admission) Continue Protonix        Subjective: he has no recurrent chest pain. No other complaints.   Physical Exam: Vitals:   01/13/22 2350 01/14/22 0409 01/14/22 0416 01/14/22 0834  BP: 107/86 (!) 119/92  115/85  Pulse: 90 96    Resp: 18 18  16   Temp: 98.4 F (36.9 C) 98.4 F (36.9 C)  98.1 F (36.7 C)  TempSrc: Tympanic Oral    SpO2: 99% 98%  96%  Weight:   63.1 kg   Height:       General exam: Appears comfortable  HEENT: PERRLA, oral mucosa moist, no sclera icterus or thrush Respiratory system: Clear to auscultation. Respiratory effort normal. Cardiovascular system: S1 & S2 heard, regular rate and rhythm Gastrointestinal system: Abdomen soft, non-tender, nondistended. Normal bowel sounds   Central nervous system: Alert and oriented. No focal neurological deficits. Extremities: No cyanosis, clubbing or edema Skin: No rashes or ulcers Psychiatry:  Mood & affect appropriate.    Data Reviewed:    Family Communication:   Disposition: Status is: Observation    Planned Discharge Destination: Home     Time spent: 35 minutes  Author: Debbe Odea, MD 01/14/2022 1:30 PM  For on call review [email or web address]
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
## 2022-01-14 NOTE — Assessment & Plan Note (Deleted)
-   likely related to his poor EF - cont b blocker - give IV mag to get Mg level > 2 - awaiting cardiac eval

## 2022-01-14 NOTE — Progress Notes (Signed)
°  Echocardiogram 2D Echocardiogram has been performed.  Johny Chess 01/14/2022, 4:12 PM

## 2022-01-14 NOTE — Care Management Obs Status (Signed)
MEDICARE OBSERVATION STATUS NOTIFICATION   Patient Details  Name: Ronald Mcdowell MRN: [id number] Date of Birth: 1945-12-28   Medicare Observation Status Notification Given:  Yes    Zenon Mayo, RN 01/14/2022, 11:58 AM

## 2022-01-15 ENCOUNTER — Ambulatory Visit: Payer: Medicare HMO | Admitting: Cardiology

## 2022-01-15 DIAGNOSIS — N390 Urinary tract infection, site not specified: Secondary | ICD-10-CM

## 2022-01-15 DIAGNOSIS — I240 Acute coronary thrombosis not resulting in myocardial infarction: Secondary | ICD-10-CM | POA: Diagnosis not present

## 2022-01-15 DIAGNOSIS — I4729 Other ventricular tachycardia: Secondary | ICD-10-CM | POA: Diagnosis not present

## 2022-01-15 DIAGNOSIS — I255 Ischemic cardiomyopathy: Secondary | ICD-10-CM

## 2022-01-15 DIAGNOSIS — I200 Unstable angina: Secondary | ICD-10-CM | POA: Diagnosis not present

## 2022-01-15 DIAGNOSIS — I420 Dilated cardiomyopathy: Secondary | ICD-10-CM

## 2022-01-15 DIAGNOSIS — I259 Chronic ischemic heart disease, unspecified: Secondary | ICD-10-CM

## 2022-01-15 DIAGNOSIS — I472 Ventricular tachycardia, unspecified: Secondary | ICD-10-CM

## 2022-01-15 DIAGNOSIS — I2510 Atherosclerotic heart disease of native coronary artery without angina pectoris: Secondary | ICD-10-CM

## 2022-01-15 DIAGNOSIS — I5020 Unspecified systolic (congestive) heart failure: Secondary | ICD-10-CM

## 2022-01-15 DIAGNOSIS — I219 Acute myocardial infarction, unspecified: Secondary | ICD-10-CM

## 2022-01-15 LAB — APTT
aPTT: 45 seconds — ABNORMAL HIGH (ref 24–36)
aPTT: 72 seconds — ABNORMAL HIGH (ref 24–36)
aPTT: 50 seconds — ABNORMAL HIGH (ref 24–36)

## 2022-01-15 LAB — CBC
HCT: 39.9 % (ref 39.0–52.0)
Hemoglobin: 13.7 g/dL (ref 13.0–17.0)
MCH: 31.5 pg (ref 26.0–34.0)
MCHC: 34.3 g/dL (ref 30.0–36.0)
MCV: 91.7 fL (ref 80.0–100.0)
Platelets: 145 10*3/uL — ABNORMAL LOW (ref 150–400)
RBC: 4.35 MIL/uL (ref 4.22–5.81)
RDW: 15.2 % (ref 11.5–15.5)
WBC: 13.3 10*3/uL — ABNORMAL HIGH (ref 4.0–10.5)
nRBC: 0 % (ref 0.0–0.2)

## 2022-01-15 LAB — HEPARIN LEVEL (UNFRACTIONATED): Heparin Unfractionated: >1.1 IU/mL — ABNORMAL HIGH (ref 0.30–0.70)

## 2022-01-15 MED ORDER — SODIUM CHLORIDE 0.9 % IV SOLN: INTRAVENOUS | Status: DC

## 2022-01-15 MED ORDER — SODIUM CHLORIDE 0.9 % IV SOLN
250.0000 mL | INTRAVENOUS | Status: DC | PRN
Start: 2022-01-15 — End: 2022-01-16

## 2022-01-15 MED ORDER — SODIUM CHLORIDE 0.9% FLUSH
3.0000 mL | Freq: Two times a day (BID) | INTRAVENOUS | Status: DC
  Administered 2022-01-15 – 2022-01-16 (×2): 3 mL via INTRAVENOUS

## 2022-01-15 MED ORDER — ASPIRIN 81 MG PO CHEW
81.0000 mg | CHEWABLE_TABLET | ORAL | Status: AC
  Administered 2022-01-16: 81 mg via ORAL
  Filled 2022-01-15: qty 1

## 2022-01-15 MED ORDER — SODIUM CHLORIDE 0.9% FLUSH: 3.0000 mL | INTRAVENOUS | Status: DC | PRN

## 2022-01-15 NOTE — Progress Notes (Signed)
ANTICOAGULATION CONSULT NOTE - Follow Up Consult  Pharmacy Consult for heparin Indication: chest pain/ACS and atrial fibrillation  Labs: Recent Labs    01/13/22 0041 01/13/22 0217 01/14/22 0400 01/15/22 0140  HGB 14.4  --  13.9 13.7  HCT 43.7  --  41.8 39.9  PLT 192  --  165 145*  APTT  --   --   --  45*  HEPARINUNFRC  --   --   --  >1.10*  CREATININE 1.52*  --  1.48*  --   TROPONINIHS 12 11  --   --     Assessment: 75yo male subtherapeutic on heparin with initial dosing while Eliquis on hold; no infusion issues or signs of bleeding per RN.  Goal of Therapy:  aPTT 66-102 seconds   Plan:  Will increase heparin infusion by 2-3 units/kg/hr to 1000 units/hr and check PTT in 8 hours.    Wynona Neat, PharmD, BCPS  01/15/2022,4:11 AM

## 2022-01-15 NOTE — Plan of Care (Signed)
°  Problem: Activity: Goal: Risk for activity intolerance will decrease Outcome: Progressing   Problem: Coping: Goal: Level of anxiety will decrease Outcome: Progressing   Problem: Pain Managment: Goal: General experience of comfort will improve Outcome: Progressing   Problem: Activity: Goal: Capacity to carry out activities will improve Outcome: Progressing

## 2022-01-15 NOTE — Progress Notes (Signed)
Progress Note   Patient: Ronald Mcdowell BSJ:[id number] DOB: Jan 05, 1946 DOA: 01/12/2022     1 DOS: the patient was seen and examined on 01/15/2022   Brief hospital course: 76 y.o. male with medical history significant of with history of atrial fibrillation, CHF with most recent ejection fraction showing 15%, coronary artery disease, GERD, ischemic cardiomyopathy, tobacco use disorder, and more presented to the [facility] ED on 2/18, on the same day he was discharged from [facility], with a chief complaint of shortness of breath and chest pain.    He was admitted at [facility] for chest pain. Stress test on 2/16 negative. Per phone conversation, Dr Harl Bowie, recommended Losartan, Toprol (stop Amlodipine, Bisoprolol, Levaquin, Lisinopril) and outpt f/u. He was also discharged on keflex for dysuria.   He had taken 1 dose of each of his new medications, Eliquis, Keflex, metoprolol since being home. Patient reports that at night he woke up out of sleep feeling shortness of breath with chest pain on the left side of his chest.  All symptoms resolved with nitro.    He was admitted to [facility] on 2/19 and transferred from [facility] to [facility] for a cardiology eval.    Assessment and Plan: * Chest pain- (present on admission) - has h/o prior infarcts and a stent - stress test neg on 2/16 at [facility] for reversible ischemia, there were fixed defects - Cardiac cath planned for tomorrow  Ischemic heart disease due to coronary artery obstruction ([place])- (present on admission) NSVT -Last echo showed an ejection fraction of 15% compared to 50%   - - cont b blocker - will need life vest on dc    Ischemic dilated cardiomyopathy ([place]) EF 15-25% Management per cardiology  UTI (urinary tract infection)- (present on admission) - He states he has had dysuria for 3 wks and Keflex is the 3rd course of antibiotics as the other 2 did not work - he has no dysuria now - 5 days will be complete today  Tobacco use- (present  on admission) Advised on the importance of cessation Cont nicotine patch  Hyperlipidemia- (present on admission) Continue statin  Permanent atrial fibrillation ([place])- (present on admission) - rate controlled- Eliquis held for cath - Continue metoprolol   GERD (gastroesophageal reflux disease)- (present on admission) Continue Protonix        Subjective: No chest pain since being admitted. Eating drinking and ambulating around room.   Physical Exam: Vitals:   01/15/22 0028 01/15/22 0400 01/15/22 0450 01/15/22 0815  BP: 104/64  112/81 106/81  Pulse: 86  85 93  Resp: 18  17   Temp: 97.6 F (36.4 C)  97.7 F (36.5 C)   TempSrc: Oral  Oral   SpO2: 99%  99%   Weight:  62.3 kg    Height:       General exam: Appears comfortable  HEENT: PERRLA, oral mucosa moist, no sclera icterus or thrush Respiratory system: Clear to auscultation. Respiratory effort normal. Cardiovascular system: S1 & S2 heard, regular rate and rhythm Gastrointestinal system: Abdomen soft, non-tender, nondistended. Normal bowel sounds   Central nervous system: Alert and oriented. No focal neurological deficits. Extremities: No cyanosis, clubbing or edema Skin: No rashes or ulcers Psychiatry:  Mood & affect appropriate.    Data Reviewed:    Family Communication: wife at bedside  Disposition: Status is: Inpatient Remains inpatient appropriate because: awaiting cath - needed to hold Eliquis          Planned Discharge Destination: Home  Time spent: 35 minutes  Author: Debbe Odea, MD 01/15/2022 9:59 AM  For on call review [email or web address].

## 2022-01-15 NOTE — Assessment & Plan Note (Addendum)
EF 15-25% Management per cardiology LHC performed on 01/16/2022. It demonstrated:  40% mLAD, patent mid LCx stent with 20% ISR, moderate diffuse dz in OM1 and OM2, 70% proximal and 65% mid to dRCA, normal LVEDP and PCWP with mixed venous O2 72%.  Recommendation is for life vest on discharge and medical management.

## 2022-01-15 NOTE — Progress Notes (Incomplete)
Heart Failure Stewardship Pharmacist Progress Note   PCP: Neale Burly, MD PCP-Cardiologist: Carlyle Dolly, MD    HPI:  ***  Current HF Medications: {HF MEDS DGUYQIH:47425}  Prior to admission HF Medications: {HF MEDS PTA:26664}  Pertinent Lab Values: Serum creatinine ***, BUN ***, Potassium ***, Sodium ***, BNP ***, Magnesium ***, A1c ***, Digoxin ***   Vital Signs: Weight: *** lbs (admission weight: *** lbs) Blood pressure: ***  Heart rate: ***  I/O: -***L yesterday; net -***L ReDS reading: ***  Medication Assistance / Insurance Benefits Check: Does the patient have prescription insurance?  {Yes/No/Pending:24180} Type of insurance plan: ***  Does the patient qualify for medication assistance through manufacturers or grants?   {Yes/No/Pending:24180} Eligible grants and/or patient assistance programs: *** Medication assistance applications in progress: ***  Medication assistance applications approved: *** Approved medication assistance renewals will be completed by: ***  Outpatient Pharmacy:  Prior to admission outpatient pharmacy: *** Is the patient willing to use [facility] at discharge? {Yes/No/Pending:24180} Is the patient willing to transition their outpatient pharmacy to utilize a [facility] outpatient pharmacy?   {Yes/No/Pending:24180}    Assessment: 1. ***Acute on chronic systolic CHF (EF ***), due to ***. NYHA class *** symptoms. -    Plan: 1) Medication changes recommended at this time: -  2) Patient assistance: -  3)  Education  - To be completed prior to discharge  Kerby Nora, PharmD, BCPS Heart Failure Stewardship Pharmacist Phone [phone]

## 2022-01-15 NOTE — H&P (Signed)
L and R heart ins setting of new systolic HF. CKD 3A

## 2022-01-15 NOTE — Progress Notes (Signed)
ANTICOAGULATION CONSULT NOTE - Follow Up Consult  Pharmacy Consult for Heparin Indication: chest pain and atrial fibrillation (apixaban on hold)  Allergies  Allergen Reactions   Peanut Butter Flavor Swelling    Patient Measurements: Height: 5\' 5"  (165.1 cm) Weight: 62.3 kg (137 lb 5.6 oz) IBW/kg (Calculated) : 61.5 Heparin Dosing Weight: 62.3 kg  Vital Signs: Temp: 97.7 F (36.5 C) (02/21 0450) Temp Source: Oral (02/21 0450) BP: 106/81 (02/21 0815) Pulse Rate: 93 (02/21 0815)  Labs: Recent Labs    01/13/22 0041 01/13/22 0217 01/14/22 0400 01/15/22 0140 01/15/22 1216  HGB 14.4  --  13.9 13.7  --   HCT 43.7  --  41.8 39.9  --   PLT 192  --  165 145*  --   APTT  --   --   --  45* 72*  HEPARINUNFRC  --   --   --  >1.10*  --   CREATININE 1.52*  --  1.48*  --   --   TROPONINIHS 12 11  --   --   --     Estimated Creatinine Clearance: 38.1 mL/min (A) (by C-G formula based on SCr of 1.48 mg/dL (H)).   Medications:  Scheduled:   aspirin EC  81 mg Oral Daily   atorvastatin  40 mg Oral QPM   cephALEXin  500 mg Oral Q8H   metoprolol succinate  25 mg Oral Daily   nicotine  21 mg Transdermal Daily   pantoprazole  40 mg Oral Daily   Infusions:   heparin 1,000 Units/hr (01/15/22 0451)    Assessment: 76 yo M on apixaban PTA for hx afib.  Pt was transitioned to heparin for cardiac cath scheduled for 2/22.  Given recent apixaban dosing, currently utilizing aPTT for heparin monitoring.  aPTT within goal on 1000 units/hr.  No bleeding noted.  Goal of Therapy:  Heparin level 0.3-0.7 units/ml aPTT 66-102 seconds Monitor platelets by anticoagulation protocol: Yes   Plan:  Continue heparin at 1000 units/hr Repeat aPTT in 6 hours for confirmation Daily aPTT, heparin level, and CBC while on heparin  [employer], Pharm.D., BCPS Clinical Pharmacist  **Pharmacist phone directory can be found on [email or web address] listed under [place].  01/15/2022 1:59 PM

## 2022-01-15 NOTE — Progress Notes (Signed)
Iv infiltrated.hep gtt on hold. Iv team consult placed. Pharmacist aware.  To call [phone] once iv in place.

## 2022-01-15 NOTE — Progress Notes (Signed)
Progress Note  Patient Name: Ronald Mcdowell Date of Encounter: 01/15/2022  [facility] Cardiologist: Carlyle Dolly, MD   Subjective   Denies any chest pain or shortness of breath this morning.  Inpatient Medications    Scheduled Meds:  aspirin EC  81 mg Oral Daily   atorvastatin  40 mg Oral QPM   cephALEXin  500 mg Oral Q8H   metoprolol succinate  25 mg Oral Daily   nicotine  21 mg Transdermal Daily   pantoprazole  40 mg Oral Daily   Continuous Infusions:  heparin 1,000 Units/hr (01/15/22 0451)   PRN Meds: acetaminophen, baclofen, ondansetron (ZOFRAN) IV   Vital Signs    Vitals:   01/15/22 0028 01/15/22 0400 01/15/22 0450 01/15/22 0815  BP: 104/64  112/81 106/81  Pulse: 86  85 93  Resp: 18  17   Temp: 97.6 F (36.4 C)  97.7 F (36.5 C)   TempSrc: Oral  Oral   SpO2: 99%  99%   Weight:  62.3 kg    Height:        Intake/Output Summary (Last 24 hours) at 01/15/2022 0828 Last data filed at 01/15/2022 4742 Gross per 24 hour  Intake 292.17 ml  Output 650 ml  Net -357.83 ml   Last 3 Weights 01/15/2022 01/14/2022 01/13/2022  Weight (lbs) 137 lb 5.6 oz 139 lb 3.2 oz 136 lb 11.2 oz  Weight (kg) 62.3 kg 63.141 kg 62.007 kg      Telemetry    Atrial fibrillation with controlled ventricular response- Personally Reviewed  ECG    No new EKG to review- Personally Reviewed  Physical Exam   GEN: No acute distress.   Neck: No JVD Cardiac: Irregularly irregular, no murmurs, rubs, or gallops.  Respiratory: Clear to auscultation bilaterally. GI: Soft, nontender, non-distended  MS: No edema; No deformity. Neuro:  Nonfocal  Psych: Normal affect   Labs    High Sensitivity Troponin:   Recent Labs  Lab 01/13/22 0041 01/13/22 0217  TROPONINIHS 12 11      Chemistry Recent Labs  Lab 01/13/22 0041 01/14/22 0400  NA 139 139  K 3.9 4.3  CL 104 104  CO2 29 28  GLUCOSE 98 93  BUN 25* 25*  CREATININE 1.52* 1.48*  CALCIUM 8.2* 7.9*  GFRNONAA 48* 49*   ANIONGAP 6 7     Hematology Recent Labs  Lab 01/13/22 0041 01/14/22 0400 01/15/22 0140  WBC 14.0* 15.2* 13.3*  RBC 4.64 4.51 4.35  HGB 14.4 13.9 13.7  HCT 43.7 41.8 39.9  MCV 94.2 92.7 91.7  MCH 31.0 30.8 31.5  MCHC 33.0 33.3 34.3  RDW 15.0 15.1 15.2  PLT 192 165 145*    BNP Recent Labs  Lab 01/13/22 0042  BNP 905.0*     DDimer No results for input(s): DDIMER in the last 168 hours.   CHA2DS2-VASc Score = 5   This indicates a 7.2% annual risk of stroke. The patient's score is based upon: CHF History: 1 HTN History: 1 Diabetes History: 0 Stroke History: 2 Vascular Disease History: 0 Age Score: 1 Gender Score: 0  Radiology    ECHOCARDIOGRAM COMPLETE  Result Date: 01/14/2022    ECHOCARDIOGRAM REPORT   Patient Name:   Ronald Mcdowell Date of Exam: 01/14/2022 Medical Rec #:  [id number]      Height:       65.0 in Accession #:    [id number]     Weight:       139.2 lb  Date of Birth:  [birth year]      BSA:          1.696 m Patient Age:    76 years       BP:           115/85 mmHg Patient Gender: M              HR:           91 bpm. Exam Location:  Inpatient Procedure: 2D Echo Indications:    acute systolic chf  History:        Patient has prior history of Echocardiogram examinations, most                 recent 10/20/2019. Cardiomyopathy, Arrythmias:Atrial                 Fibrillation and NSVT, Signs/Symptoms:Chest Pain; Risk                 Factors:Dyslipidemia and Current Smoker.  Sonographer:    Johny Chess RDCS Referring Phys: [id number] [place]  1. The LV function is severely depressed. This is new compared to his previous echo from 2020.     [name] Left ventricular ejection fraction, by estimation, is <20%. The left ventricle has severely decreased function. The left ventricle demonstrates global hypokinesis. The left ventricular internal cavity size was moderately to severely dilated. Left ventricular diastolic function could not be evaluated.  2. Right  ventricular systolic function is mildly reduced. The right ventricular size is mildly enlarged. There is normal pulmonary artery systolic pressure.  3. Left atrial size was severely dilated.  4. Right atrial size was mildly dilated.  5. The mitral valve is degenerative. Mild mitral valve regurgitation.  6. The aortic valve is calcified. Aortic valve regurgitation is not visualized. No aortic stenosis is present. FINDINGS  Left Ventricle: The LV function is severely depressed. This is new compared to his previous echo from 2020. Left ventricular ejection fraction, by estimation, is <20%. The left ventricle has severely decreased function. The left ventricle demonstrates global hypokinesis. The left ventricular internal cavity size was moderately to severely dilated. There is no left ventricular hypertrophy. Left ventricular diastolic function could not be evaluated due to atrial fibrillation. Left ventricular diastolic function could not be evaluated. Right Ventricle: The right ventricular size is mildly enlarged. Right vetricular wall thickness was not well visualized. Right ventricular systolic function is mildly reduced. There is normal pulmonary artery systolic pressure. The tricuspid regurgitant velocity is 1.67 m/s, and with an assumed right atrial pressure of 3 mmHg, the estimated right ventricular systolic pressure is 45.8 mmHg. Left Atrium: Left atrial size was severely dilated. Right Atrium: Right atrial size was mildly dilated. Pericardium: There is no evidence of pericardial effusion. Mitral Valve: The mitral valve is degenerative in appearance. There is moderate thickening of the mitral valve leaflet(s). Mild mitral valve regurgitation. Tricuspid Valve: The tricuspid valve is normal in structure. Tricuspid valve regurgitation is trivial. Aortic Valve: The aortic valve is calcified. Aortic valve regurgitation is not visualized. No aortic stenosis is present. Pulmonic Valve: The pulmonic valve was normal  in structure. Pulmonic valve regurgitation is not visualized. Aorta: The aortic root and ascending aorta are structurally normal, with no evidence of dilitation. IAS/Shunts: The atrial septum is grossly normal.  LEFT VENTRICLE PLAX 2D LVIDd:         6.50 cm LVIDs:         5.90 cm LV PW:  0.90 cm LV IVS:        0.80 cm LVOT diam:     2.20 cm LVOT Area:     3.80 cm  IVC IVC diam: 1.50 cm LEFT ATRIUM             Index        RIGHT ATRIUM           Index LA diam:        4.50 cm 2.65 cm/m   RA Area:     20.00 cm LA Vol (A2C):   73.6 ml 43.40 ml/m  RA Volume:   58.70 ml  34.62 ml/m LA Vol (A4C):   89.1 ml 52.54 ml/m LA Biplane Vol: 84.9 ml 50.07 ml/m   AORTA Ao Root diam: 3.30 cm Ao Asc diam:  2.90 cm TRICUSPID VALVE TR Peak grad:   11.2 mmHg TR Vmax:        167.00 cm/s  SHUNTS Systemic Diam: 2.20 cm Mertie Moores MD Electronically signed by Mertie Moores MD Signature Date/Time: 01/14/2022/4:27:00 PM    Final     Cardiac Studies   Relevant CV Studies:   Echo- From [facility] 01/11/22 Summary    1. The left ventricle is moderately dilated in size with decreased wall  thickness.    2. The left ventricular systolic function is severely decreased, LVEF is  visually estimated at 15-20%.    3. There is mild mitral valve regurgitation.    4. The aortic valve is trileaflet with mildly thickened leaflets with normal  excursion.    5. The left atrium is moderately dilated in size.    6. The right ventricle is normal in size, with normal systolic function.    Stress Test- From [facility] 01/10/22 1. No reversible ischemia. There are fixed defects involving the  lateral and inferior wall compatible with infarcts involving the  left circumflex and RCA coronary artery distributions..   2. Left ventricular dilatation with severely reduced left  ventricular function..   3. Left ventricular ejection fraction 15%   4. Non invasive risk stratification*: High   Patient Profile     76 y.o.  male with a hx of atrial fibrillation, HFrEF (15%), CAD, GERD, ischemic cardiomyopathy, HTN, HLDtobacco use disorder who is being seen 01/14/2022 for the evaluation of CHF at the request of Dr. Wynelle Cleveland.  Assessment & Plan    Chest Pain  HFrEF  - Stress Test from [facility] on 2/16 showed no reversible ischemia, fixed defects involving the lateral and inferior wall - Echocardiogram from [facility] on 2/17 showed EF 15-20% (decreased from 50% in 2020). Unable to see images, but able to see narrative.  - HSTN 12>>11. BNP elevated to 905.0  - EKG showed afib, inverted T waves in V4-V6 - Likely that chest pain is related to severely reduced EF - Patient appears euvolemic on exam, no signs of pulmonary edema on CXR  - We will continue on aspirin 81 mg daily, Toprol-XL 25 mg daily and statin - Losartan on hold cath due to CKD - Consider transition from losartan to entresto post-cath if renal function remains stable post cath - Consider addition of Farxiga 10 mg daily and Spiro 12.5 mg daily post cath if renal function remains stable post cath - Due to patient's EF of 15%, would likely benefit from lifeVest.  - Consider referral to advanced heart failure    Non-sustained V-tach  - Per chart review, was noted to possibly have V-tach episodes  in the [facility] ER and 14 beats of V. tach on telemetry here yesterday - Patient reports some chest pain and palpitations around that time, unsure if caused by vtach  -Yesterday K 4.3, mag 1.8. Primary team supplemented mag to maintain mag>2  - BMET pending this am - Continue Toprol-XL 25 mg daily - Continue to monitor. - Given patient's low EF, would benefit from lifevest.    Permanent Atrial Fibrillation  - On eliquis 5mg  BID at home but currently on hold for heart cath tomorrow - Continue on IV heparin and Toprol-XL 25 mg daily - Heart rate is well controlled on exam   otherwise managed per primary  - UTI - GERD - Leukocytosis          For  questions or updates, please contact [facility] Please consult [email or web address] for contact info under        Signed, Fransico Him, MD  01/15/2022, 8:28 AM

## 2022-01-15 NOTE — Progress Notes (Addendum)
Heart Failure Nurse Navigator Progress Note  Screened for [facility] clinic appropriateness.   EF 15-20%, mild MR. (NEW)  Scheduled HV TOC clinic appt, updated AVS.  March 1 @ 3pm  TOC follow up -optimize -continue HF Dx -complete SDo[facility]   Pricilla Holm, MSN, RN Heart Failure Nurse Navigator [phone]

## 2022-01-15 NOTE — TOC Progression Note (Signed)
Transition of Care [facility]) - Progression Note    Patient Details  Name: Ronald Mcdowell MRN: [id number] Date of Birth: 04-Jun-1946  Transition of Care [facility]) CM/SW Contact  Zenon Mayo, RN Phone Number: 01/15/2022, 3:18 PM  Clinical Narrative:    echo done, stress test negative, completed abx therapy today, for heart cath tomorrow, would benefit from a  life vest.         Expected Discharge Plan and Services                                                 Social Determinants of Health (SDOH) Interventions    Readmission Risk Interventions No flowsheet data found.

## 2022-01-15 NOTE — Progress Notes (Unsigned)
Clinical Summary Mr. Bentsen is a 76 y.o.male   Past Medical History:  Diagnosis Date   Anemia    Atrial fibrillation (HCC)    CHF (congestive heart failure) ([place])    Chronic kidney disease, unspecified 01/14/2020   Coronary artery disease    a. h/o MI in 2000 w/ prior LCX stenting;  b. 7.2014 Abnl Cardiolite, EF 41%, mod-large inferolat scar;  c. 07/2013 Cath/PCI: LM nl, LAD 10-20, D1 40-50p, LCX 95-99 @ distal stent margin (2.5x20 Promus Premier DES), RCA dom, 40p, 7m, 70d, EF 35-40%.   Elevated cholesterol    GERD (gastroesophageal reflux disease)    Graves disease 04/2020   s/p I-131 thyroid ablation    Hemorrhoids    Hypertension    Hyperthyroidism 09/27/2020   Ischemic cardiomyopathy    a. 07/2013 EF 35-40% by LV gram.; EF 50% on ECHO in November 2020   Myocardial infarction [facility])    Reflux esophagitis    Spondylosis of cervical joint    C3-4, C6-7   Tobacco user    Vitamin D deficiency disease 08/24/2019     Allergies  Allergen Reactions   Peanut Butter Flavor Swelling     No current facility-administered medications for this visit.   No current outpatient medications on file.   Facility-Administered Medications Ordered in Other Visits  Medication Dose Route Frequency Provider Last Rate Last Admin   acetaminophen (TYLENOL) tablet 650 mg  650 mg Oral Q4H PRN Zierle-Ghosh, Asia B, DO       aspirin EC tablet 81 mg  81 mg Oral Daily Johnson, Clanford L, MD   81 mg at 01/15/22 0814   atorvastatin (LIPITOR) tablet 40 mg  40 mg Oral QPM Johnson, Clanford L, MD   40 mg at 01/14/22 1725   baclofen (LIORESAL) tablet 5 mg  5 mg Oral TID PRN Wynetta Emery, Clanford L, MD       cephALEXin (KEFLEX) capsule 500 mg  500 mg Oral Q8H Rizwan, Saima, MD   500 mg at 01/15/22 0453   heparin ADULT infusion 100 units/mL (25000 units/272mL)  1,000 Units/hr Intravenous Continuous Laren Everts, RPH 10 mL/hr at 01/15/22 0451 1,000 Units/hr at 01/15/22 0451   metoprolol succinate  (TOPROL-XL) 24 hr tablet 25 mg  25 mg Oral Daily Johnson, Clanford L, MD   25 mg at 01/15/22 1829   nicotine (NICODERM CQ - dosed in mg/24 hours) patch 21 mg  21 mg Transdermal Daily Zierle-Ghosh, Asia B, DO       ondansetron (ZOFRAN) injection 4 mg  4 mg Intravenous Q6H PRN Zierle-Ghosh, Asia B, DO       pantoprazole (PROTONIX) EC tablet 40 mg  40 mg Oral Daily Zierle-Ghosh, Asia B, DO   40 mg at 01/15/22 9371     Past Surgical History:  Procedure Laterality Date   BIOPSY  01/09/2018   Procedure: BIOPSY;  Surgeon: Daneil Dolin, MD;  Location: [facility] SU[facility];  Service: Endoscopy;;  esophageal biopsies   CARDIAC CATHETERIZATION     COLONOSCOPY     COLONOSCOPY N/A 01/25/2016   RMR: normal ileocolonoscopy ( anal canal hemorrhoids)   CORONARY ANGIOPLASTY WITH STENT PLACEMENT  08/23/2013   mid circumflex  DES    by Dr Martinique   CORONARY STENT PLACEMENT  2000   ESOPHAGOGASTRODUODENOSCOPY N/A 01/25/2016   RMR: Reflux esophagitits. Cervical web with passage of the scope. Status post esophageal biosy. Hiatal hernia.    ESOPHAGOGASTRODUODENOSCOPY N/A 01/09/2018   Rourk: cervical/proximal esophageal  web s/p dilation, markedly abnormal esophagus c/w biopsy proven eosinophilic esophagitis. small hiatal hernia.   ESOPHAGOGASTRODUODENOSCOPY N/A 05/31/2020   Procedure: ESOPHAGOGASTRODUODENOSCOPY (EGD);  Surgeon: Daneil Dolin, MD;  Location: [facility];  Service: Endoscopy;  Laterality: N/A;  8:30am   LEFT HEART CATHETERIZATION WITH CORONARY ANGIOGRAM N/A 08/23/2013   Procedure: LEFT HEART CATHETERIZATION WITH CORONARY ANGIOGRAM;  Surgeon: Peter M Martinique, MD;  Location: [facility] CATH LAB;  Service: Cardiovascular;  Laterality: N/A;   MALONEY DILATION N/A 01/09/2018   Procedure: [name] DILATION;  Surgeon: Daneil Dolin, MD;  Location: [facility];  Service: Endoscopy;  Laterality: N/A;   MALONEY DILATION N/A 05/31/2020   Procedure: [name] DILATION;  Surgeon: Daneil Dolin, MD;  Location: [facility];   Service: Endoscopy;  Laterality: N/A;   TRANSURETHRAL RESECTION OF PROSTATE  01/09/2012   Procedure: TRANSURETHRAL RESECTION OF THE PROSTATE (TURP);  Surgeon: Marissa Nestle, MD;  Location: [facility] ORS;  Service: Urology;  Laterality: N/A;     Allergies  Allergen Reactions   Peanut Butter Flavor Swelling      Family History  Problem Relation Age of Onset   Early death Mother        childbirth   Heart attack Father    Cancer Father    Heart disease Brother        CABG   Heart disease Brother        slight heart attack   Anesthesia problems Neg Hx    Hypotension Neg Hx    Malignant hyperthermia Neg Hx    Pseudochol deficiency Neg Hx    Colon cancer Neg Hx    Liver disease Neg Hx    Gastric cancer Neg Hx    Esophageal cancer Neg Hx      Social History Mr. Markowicz reports that he has been smoking cigarettes. He started smoking about 56 years ago. He has a 53.00 pack-year smoking history. He has never used smokeless tobacco. Mr. Kyler reports no history of alcohol use.   Review of Systems CONSTITUTIONAL: No weight loss, fever, chills, weakness or fatigue.  HEENT: Eyes: No visual loss, blurred vision, double vision or yellow sclerae.No hearing loss, sneezing, congestion, runny nose or sore throat.  SKIN: No rash or itching.  CARDIOVASCULAR:  RESPIRATORY: No shortness of breath, cough or sputum.  GASTROINTESTINAL: No anorexia, nausea, vomiting or diarrhea. No abdominal pain or blood.  GENITOURINARY: No burning on urination, no polyuria NEUROLOGICAL: No headache, dizziness, syncope, paralysis, ataxia, numbness or tingling in the extremities. No change in bowel or bladder control.  MUSCULOSKELETAL: No muscle, back pain, joint pain or stiffness.  LYMPHATICS: No enlarged nodes. No history of splenectomy.  PSYCHIATRIC: No history of depression or anxiety.  ENDOCRINOLOGIC: No reports of sweating, cold or heat intolerance. No polyuria or polydipsia.  [name]   Physical  Examination There were no vitals filed for this visit. There were no vitals filed for this visit.  Gen: resting comfortably, no acute distress HEENT: no scleral icterus, pupils equal round and reactive, no palptable cervical adenopathy,  CV Resp: Clear to auscultation bilaterally GI: abdomen is soft, non-tender, non-distended, normal bowel sounds, no hepatosplenomegaly MSK: extremities are warm, no edema.  Skin: warm, no rash Neuro:  no focal deficits Psych: appropriate affect   Diagnostic Studies     Assessment and Plan        Arnoldo Lenis, M.D., F.A.C.C.

## 2022-01-16 ENCOUNTER — Inpatient Hospital Stay (HOSPITAL_COMMUNITY)
Admission: EM | Disposition: A | Discharge status: Home / Self Care | Payer: Self-pay | Source: Home / Self Care | Attending: Internal Medicine

## 2022-01-16 ENCOUNTER — Other Ambulatory Visit (HOSPITAL_COMMUNITY): Payer: Self-pay

## 2022-01-16 DIAGNOSIS — E7800 Pure hypercholesterolemia, unspecified: Secondary | ICD-10-CM

## 2022-01-16 DIAGNOSIS — I240 Acute coronary thrombosis not resulting in myocardial infarction: Secondary | ICD-10-CM

## 2022-01-16 DIAGNOSIS — I259 Chronic ischemic heart disease, unspecified: Secondary | ICD-10-CM

## 2022-01-16 DIAGNOSIS — I200 Unstable angina: Secondary | ICD-10-CM | POA: Diagnosis not present

## 2022-01-16 DIAGNOSIS — K219 Gastro-esophageal reflux disease without esophagitis: Secondary | ICD-10-CM | POA: Diagnosis not present

## 2022-01-16 DIAGNOSIS — I25110 Atherosclerotic heart disease of native coronary artery with unstable angina pectoris: Principal | ICD-10-CM

## 2022-01-16 DIAGNOSIS — I4729 Other ventricular tachycardia: Secondary | ICD-10-CM

## 2022-01-16 DIAGNOSIS — I255 Ischemic cardiomyopathy: Secondary | ICD-10-CM | POA: Diagnosis not present

## 2022-01-16 DIAGNOSIS — I420 Dilated cardiomyopathy: Secondary | ICD-10-CM

## 2022-01-16 HISTORY — PX: RIGHT/LEFT HEART CATH AND CORONARY ANGIOGRAPHY: CATH118266

## 2022-01-16 LAB — POCT I-STAT EG7
Acid-Base Excess: 0 mmol/L (ref 0.0–2.0)
Acid-Base Excess: 0 mmol/L (ref 0.0–2.0)
Bicarbonate: 25.7 mmol/L (ref 20.0–28.0)
Bicarbonate: 26.1 mmol/L (ref 20.0–28.0)
Calcium, Ion: 1.07 mmol/L — ABNORMAL LOW (ref 1.15–1.40)
Calcium, Ion: 1.08 mmol/L — ABNORMAL LOW (ref 1.15–1.40)
HCT: 41 % (ref 39.0–52.0)
HCT: 41 % (ref 39.0–52.0)
Hemoglobin: 13.9 g/dL (ref 13.0–17.0)
Hemoglobin: 13.9 g/dL (ref 13.0–17.0)
O2 Saturation: 74 %
O2 Saturation: 71 %
Potassium: 4.4 mmol/L (ref 3.5–5.1)
Potassium: 4.4 mmol/L (ref 3.5–5.1)
Sodium: 143 mmol/L (ref 135–145)
Sodium: 143 mmol/L (ref 135–145)
TCO2: 27 mmol/L (ref 22–32)
TCO2: 27 mmol/L (ref 22–32)
pCO2, Ven: 44.7 mmHg (ref 44–60)
pCO2, Ven: 45.3 mmHg (ref 44–60)
pH, Ven: 7.369 (ref 7.25–7.43)
pH, Ven: 7.368 (ref 7.25–7.43)
pO2, Ven: 41 mmHg (ref 32–45)
pO2, Ven: 39 mmHg (ref 32–45)

## 2022-01-16 LAB — POCT I-STAT 7, (LYTES, BLD GAS, ICA,H+H)
Acid-Base Excess: 1 mmol/L (ref 0.0–2.0)
Bicarbonate: 25.5 mmol/L (ref 20.0–28.0)
Calcium, Ion: 1.07 mmol/L — ABNORMAL LOW (ref 1.15–1.40)
HCT: 40 % (ref 39.0–52.0)
Hemoglobin: 13.6 g/dL (ref 13.0–17.0)
O2 Saturation: 100 %
Potassium: 4.4 mmol/L (ref 3.5–5.1)
Sodium: 142 mmol/L (ref 135–145)
TCO2: 27 mmol/L (ref 22–32)
pCO2 arterial: 41.3 mmHg (ref 32–48)
pH, Arterial: 7.399 (ref 7.35–7.45)
pO2, Arterial: 212 mmHg — ABNORMAL HIGH (ref 83–108)

## 2022-01-16 LAB — BASIC METABOLIC PANEL
Anion gap: 9 (ref 5–15)
BUN: 15 mg/dL (ref 8–23)
CO2: 24 mmol/L (ref 22–32)
Calcium: 7.6 mg/dL — ABNORMAL LOW (ref 8.9–10.3)
Chloride: 106 mmol/L (ref 98–111)
Creatinine, Ser: 1.3 mg/dL — ABNORMAL HIGH (ref 0.61–1.24)
GFR, Estimated: 58 mL/min — ABNORMAL LOW (ref >60)
Glucose, Bld: 93 mg/dL (ref 70–99)
Potassium: 4.3 mmol/L (ref 3.5–5.1)
Sodium: 139 mmol/L (ref 135–145)

## 2022-01-16 LAB — CBC
HCT: 39.5 % (ref 39.0–52.0)
Hemoglobin: 13.5 g/dL (ref 13.0–17.0)
MCH: 31.3 pg (ref 26.0–34.0)
MCHC: 34.2 g/dL (ref 30.0–36.0)
MCV: 91.4 fL (ref 80.0–100.0)
Platelets: 126 10*3/uL — ABNORMAL LOW (ref 150–400)
RBC: 4.32 MIL/uL (ref 4.22–5.81)
RDW: 15.2 % (ref 11.5–15.5)
WBC: 12.4 10*3/uL — ABNORMAL HIGH (ref 4.0–10.5)
nRBC: 0 % (ref 0.0–0.2)

## 2022-01-16 LAB — MAGNESIUM: Magnesium: 1.6 mg/dL — ABNORMAL LOW (ref 1.7–2.4)

## 2022-01-16 LAB — APTT: aPTT: 113 seconds — ABNORMAL HIGH (ref 24–36)

## 2022-01-16 LAB — HEPARIN LEVEL (UNFRACTIONATED): Heparin Unfractionated: 1.1 IU/mL — ABNORMAL HIGH (ref 0.30–0.70)

## 2022-01-16 SURGERY — RIGHT/LEFT HEART CATH AND CORONARY ANGIOGRAPHY
Anesthesia: LOCAL

## 2022-01-16 MED ORDER — MIDAZOLAM HCL 2 MG/2ML IJ SOLN
INTRAMUSCULAR | Status: AC
  Filled 2022-01-16: qty 2

## 2022-01-16 MED ORDER — HYDRALAZINE HCL 20 MG/ML IJ SOLN: 10.0000 mg | INTRAMUSCULAR | Status: AC | PRN

## 2022-01-16 MED ORDER — APIXABAN 5 MG PO TABS
5.0000 mg | ORAL_TABLET | Freq: Two times a day (BID) | ORAL | Status: DC
  Administered 2022-01-16 – 2022-01-17 (×2): 5 mg via ORAL
  Filled 2022-01-16 (×2): qty 1

## 2022-01-16 MED ORDER — ACETAMINOPHEN 325 MG PO TABS: 650.0000 mg | ORAL_TABLET | ORAL | Status: DC | PRN

## 2022-01-16 MED ORDER — METOPROLOL SUCCINATE ER 25 MG PO TB24
12.5000 mg | ORAL_TABLET | Freq: Once | ORAL | Status: AC
  Administered 2022-01-16: 12.5 mg via ORAL
  Filled 2022-01-16: qty 1

## 2022-01-16 MED ORDER — LIDOCAINE HCL (PF) 1 % IJ SOLN
INTRAMUSCULAR | Status: DC | PRN
Start: 2022-01-16 — End: 2022-01-16
  Administered 2022-01-16 (×2): 2 mL

## 2022-01-16 MED ORDER — FENTANYL CITRATE (PF) 100 MCG/2ML IJ SOLN
INTRAMUSCULAR | Status: DC | PRN
  Administered 2022-01-16: 25 ug via INTRAVENOUS

## 2022-01-16 MED ORDER — LABETALOL HCL 5 MG/ML IV SOLN: 10.0000 mg | INTRAVENOUS | Status: AC | PRN

## 2022-01-16 MED ORDER — SODIUM CHLORIDE 0.9 % IV SOLN: INTRAVENOUS | Status: AC

## 2022-01-16 MED ORDER — FENTANYL CITRATE (PF) 100 MCG/2ML IJ SOLN
INTRAMUSCULAR | Status: AC
  Filled 2022-01-16: qty 2

## 2022-01-16 MED ORDER — SODIUM CHLORIDE 0.9 % IV SOLN: 250.0000 mL | INTRAVENOUS | Status: DC | PRN

## 2022-01-16 MED ORDER — HEPARIN (PORCINE) IN NACL 1000-0.9 UT/500ML-% IV SOLN
INTRAVENOUS | Status: DC | PRN
  Administered 2022-01-16 (×2): 500 mL

## 2022-01-16 MED ORDER — SODIUM CHLORIDE 0.9% FLUSH
3.0000 mL | Freq: Two times a day (BID) | INTRAVENOUS | Status: DC
  Administered 2022-01-16 – 2022-01-17 (×2): 3 mL via INTRAVENOUS

## 2022-01-16 MED ORDER — HEPARIN SODIUM (PORCINE) 1000 UNIT/ML IJ SOLN
INTRAMUSCULAR | Status: AC
  Filled 2022-01-16: qty 10

## 2022-01-16 MED ORDER — LIDOCAINE HCL (PF) 1 % IJ SOLN
INTRAMUSCULAR | Status: AC
  Filled 2022-01-16: qty 30

## 2022-01-16 MED ORDER — SODIUM CHLORIDE 0.9% FLUSH: 3.0000 mL | INTRAVENOUS | Status: DC | PRN

## 2022-01-16 MED ORDER — IOHEXOL 350 MG/ML SOLN
INTRAVENOUS | Status: DC | PRN
Start: 2022-01-16 — End: 2022-01-16
  Administered 2022-01-16: 30 mL

## 2022-01-16 MED ORDER — METOPROLOL SUCCINATE ER 25 MG PO TB24
37.5000 mg | ORAL_TABLET | Freq: Every day | ORAL | Status: DC
  Administered 2022-01-17: 37.5 mg via ORAL
  Filled 2022-01-16: qty 2

## 2022-01-16 MED ORDER — VERAPAMIL HCL 2.5 MG/ML IV SOLN
INTRAVENOUS | Status: AC
  Filled 2022-01-16: qty 2

## 2022-01-16 MED ORDER — ONDANSETRON HCL 4 MG/2ML IJ SOLN: 4.0000 mg | Freq: Four times a day (QID) | INTRAMUSCULAR | Status: DC | PRN

## 2022-01-16 MED ORDER — HEPARIN (PORCINE) IN NACL 1000-0.9 UT/500ML-% IV SOLN
INTRAVENOUS | Status: AC
Start: 2022-01-16 — End: ?
  Filled 2022-01-16: qty 1000

## 2022-01-16 MED ORDER — HEPARIN SODIUM (PORCINE) 1000 UNIT/ML IJ SOLN
INTRAMUSCULAR | Status: DC | PRN
  Administered 2022-01-16: 3000 [IU] via INTRAVENOUS

## 2022-01-16 MED ORDER — MIDAZOLAM HCL 2 MG/2ML IJ SOLN
INTRAMUSCULAR | Status: DC | PRN
Start: 2022-01-16 — End: 2022-01-16
  Administered 2022-01-16: 1 mg via INTRAVENOUS

## 2022-01-16 SURGICAL SUPPLY — 11 items
CATH 5FR JL3.5 JR4 ANG PIG MP (CATHETERS) ×1 IMPLANT
CATH BALLN WEDGE 5F 110CM (CATHETERS) ×1 IMPLANT
DEVICE RAD COMP TR BAND LRG (VASCULAR PRODUCTS) ×1 IMPLANT
GLIDESHEATH SLEND A-KIT 6F 22G (SHEATH) ×1 IMPLANT
GUIDEWIRE .025 260CM (WIRE) ×1 IMPLANT
KIT HEART LEFT (KITS) ×2 IMPLANT
PACK CARDIAC CATHETERIZATION (CUSTOM PROCEDURE TRAY) ×2 IMPLANT
SHEATH GLIDE SLENDER 4/5FR (SHEATH) ×1 IMPLANT
SHEATH PROBE COVER 6X72 (BAG) ×1 IMPLANT
TRANSDUCER W/STOPCOCK (MISCELLANEOUS) ×2 IMPLANT
TUBING CIL FLEX 10 FLL-RA (TUBING) ×2 IMPLANT

## 2022-01-16 NOTE — Progress Notes (Signed)
ANTICOAGULATION CONSULT NOTE - Follow Up Consult  Pharmacy Consult for heparin Indication: chest pain/ACS and atrial fibrillation  Labs: Recent Labs    01/14/22 0400 01/14/22 0400 01/15/22 0140 01/15/22 1216 01/15/22 1903 01/16/22 0417  HGB 13.9  --  13.7  --   --  13.5  HCT 41.8  --  39.9  --   --  39.5  PLT 165  --  145*  --   --  126*  APTT  --    < > 45* 72* 50* 113*  HEPARINUNFRC  --   --  >1.10*  --   --  1.10*  CREATININE 1.48*  --   --   --   --  1.30*   < > = values in this interval not displayed.     Assessment: 76yo male supra - therapeutic on heparin with initial dosing while Eliquis on hold; no infusion issues or signs of bleeding per RN.  Goal of Therapy:  aPTT 66-102 seconds   Plan:  Decrease heparin to 900 units / hr Follow up after cath  Thank you Anette Guarneri, PharmD 01/16/2022,5:44 AM

## 2022-01-16 NOTE — Interval H&P Note (Signed)
Cath Lab Visit (complete for each Cath Lab visit)  Clinical Evaluation Leading to the Procedure:   ACS: Yes.    Non-ACS:    Anginal Classification: CCS Mcdowell  Anti-ischemic medical therapy: Minimal Therapy (1 class of medications)  Non-Invasive Test Results: No non-invasive testing performed  Prior CABG: No previous CABG      History and Physical Interval Note:  01/16/2022 4:03 PM  Ronald Mcdowell  has presented today for surgery, with the diagnosis of CHF.  The various methods of treatment have been discussed with the patient and family. After consideration of risks, benefits and other options for treatment, the patient has consented to  Procedure(s): RIGHT/LEFT HEART CATH AND CORONARY ANGIOGRAPHY (N/A) as a surgical intervention.  The patient's history has been reviewed, patient examined, no change in status, stable for surgery.  I have reviewed the patient's chart and labs.  Questions were answered to the patient's satisfaction.     Ronald Mcdowell

## 2022-01-16 NOTE — Progress Notes (Signed)
Heart Failure Stewardship Pharmacist Progress Note   PCP: Neale Burly, MD PCP-Cardiologist: Carlyle Dolly, MD    HPI:  76 yo M with PMH of afib, CHF, ICM, CAD, GERD, HTN, HLD, and tobacco use. He presented to [facility] on 2/15 with chest pain and shortness of breath. ECHO there showed LVEF of 15% and no ischemia on stress test from 2/16. He was discharged on 2/18 and then came to [facility] ED on the same day with continued symptoms. He was transferred to [facility] for further cardiac evaluation. CXR negative. ECHO done on 2/20 and LVEF is <20% with mildly reduced RV. R/LHC scheduled for today.   Current HF Medications: Beta Blocker: metoprolol XL 25 mg daily  Prior to admission HF Medications: Diuretic: furosemide 20 mg PRN Beta blocker: metoprolol XL 25 mg daily ACE/ARB/ARNI: losartan 25 mg daily  Pertinent Lab Values: Serum creatinine 1.30, BUN 15, Potassium 4.3, Sodium 139, BNP 905, Magnesium 1.8   Vital Signs: Weight: 137 lbs (admission weight: 136 lbs) Blood pressure: 120/80s  Heart rate: 70-80s   Medication Assistance / Insurance Benefits Check: Does the patient have prescription insurance?  Yes Type of insurance plan: [employer]  Outpatient Pharmacy:  Prior to admission outpatient pharmacy: [facility] Is the patient willing to use [facility] [facility] at discharge? Yes Is the patient willing to transition their outpatient pharmacy to utilize a [facility] outpatient pharmacy?   Pending    Assessment: 1. Acute on chronic systolic CHF (EF <84%), due to ICM. R/LHC scheduled for today. NYHA class II symptoms. - Continue metoprolol XL 25 mg daily - Losartan on hold - consider resuming after cath - Consider adding spironolactone prior to discharge - Consider adding Jardiance 10 mg daily prior to discharge   Plan: 1) Medication changes recommended at this time: - None pending cath findings today - Consider resuming losartan or starting spironolactone after cath  2)  Patient assistance: - Entresto copay $45 - Jardiance coapy $45 Wilder Glade copay $95  3)  Education  - To be completed prior to discharge  Kerby Nora, PharmD, BCPS Heart Failure Stewardship Pharmacist Phone [phone]

## 2022-01-16 NOTE — H&P (View-Only) (Signed)
Progress Note  Patient Name: Ronald Mcdowell Date of Encounter: 01/16/2022  [facility] Cardiologist: Carlyle Dolly, MD   Subjective   No SOB or CP since I saw him yesterday  Inpatient Medications    Scheduled Meds:  aspirin EC  81 mg Oral Daily   atorvastatin  40 mg Oral QPM   metoprolol succinate  25 mg Oral Daily   nicotine  21 mg Transdermal Daily   pantoprazole  40 mg Oral Daily   sodium chloride flush  3 mL Intravenous Q12H   Continuous Infusions:  sodium chloride     sodium chloride 10 mL/hr at 01/16/22 0444   heparin 1,000 Units/hr (01/15/22 2032)   PRN Meds: sodium chloride, acetaminophen, baclofen, ondansetron (ZOFRAN) IV, sodium chloride flush   Vital Signs    Vitals:   01/15/22 1926 01/16/22 0400 01/16/22 0448 01/16/22 0802  BP: 117/77  114/80 120/82  Pulse: 90   86  Resp: 17  17 18   Temp: 98.1 F (36.7 C)  98 F (36.7 C) 97.9 F (36.6 C)  TempSrc: Oral  Oral Oral  SpO2: 100%  99% 98%  Weight:  62.5 kg    Height:        Intake/Output Summary (Last 24 hours) at 01/16/2022 0849 Last data filed at 01/16/2022 1443 Gross per 24 hour  Intake 323 ml  Output 750 ml  Net -427 ml    Last 3 Weights 01/16/2022 01/15/2022 01/14/2022  Weight (lbs) 137 lb 11.2 oz 137 lb 5.6 oz 139 lb 3.2 oz  Weight (kg) 62.46 kg 62.3 kg 63.141 kg      Telemetry    Atrial fibrillation with CVR and 10 beat run of NSVT- Personally Reviewed  ECG    No new EKG to review- Personally Reviewed  Physical Exam  GEN: Well nourished, well developed in no acute distress HEENT: Normal NECK: No JVD; No carotid bruits LYMPHATICS: No lymphadenopathy CARDIAC:irregularly irregular, no murmurs, rubs, gallops RESPIRATORY:  Clear to auscultation without rales, wheezing or rhonchi  ABDOMEN: Soft, non-tender, non-distended MUSCULOSKELETAL:  No edema; No deformity  SKIN: Warm and dry NEUROLOGIC:  Alert and oriented x 3 PSYCHIATRIC:  Normal affect   Labs    High Sensitivity  Troponin:   Recent Labs  Lab 01/13/22 0041 01/13/22 0217  TROPONINIHS 12 11       Chemistry Recent Labs  Lab 01/13/22 0041 01/14/22 0400 01/16/22 0417  NA 139 139 139  K 3.9 4.3 4.3  CL 104 104 106  CO2 29 28 24   GLUCOSE 98 93 93  BUN 25* 25* 15  CREATININE 1.52* 1.48* 1.30*  CALCIUM 8.2* 7.9* 7.6*  GFRNONAA 48* 49* 58*  ANIONGAP 6 7 9       Hematology Recent Labs  Lab 01/14/22 0400 01/15/22 0140 01/16/22 0417  WBC 15.2* 13.3* 12.4*  RBC 4.51 4.35 4.32  HGB 13.9 13.7 13.5  HCT 41.8 39.9 39.5  MCV 92.7 91.7 91.4  MCH 30.8 31.5 31.3  MCHC 33.3 34.3 34.2  RDW 15.1 15.2 15.2  PLT 165 145* 126*     BNP Recent Labs  Lab 01/13/22 0042  BNP 905.0*      DDimer No results for input(s): DDIMER in the last 168 hours.   CHA2DS2-VASc Score = 5   This indicates a 7.2% annual risk of stroke. The patient's score is based upon: CHF History: 1 HTN History: 1 Diabetes History: 0 Stroke History: 2 Vascular Disease History: 0 Age Score: 1 Gender Score: 0  Radiology    ECHOCARDIOGRAM COMPLETE  Result Date: 01/14/2022    ECHOCARDIOGRAM REPORT   Patient Name:   Ronald Mcdowell Date of Exam: 01/14/2022 Medical Rec #:  [id number]      Height:       65.0 in Accession #:    [id number]     Weight:       139.2 lb Date of Birth:  Aug 29, 1946      BSA:          1.696 m Patient Age:    32 years       BP:           115/85 mmHg Patient Gender: M              HR:           91 bpm. Exam Location:  Inpatient Procedure: 2D Echo Indications:    acute systolic chf  History:        Patient has prior history of Echocardiogram examinations, most                 recent 10/20/2019. Cardiomyopathy, Arrythmias:Atrial                 Fibrillation and NSVT, Signs/Symptoms:Chest Pain; Risk                 Factors:Dyslipidemia and Current Smoker.  Sonographer:    Johny Chess RDCS Referring Phys: [id number] [place]  1. The LV function is severely depressed. This is new compared  to his previous echo from 2020.     [name] Left ventricular ejection fraction, by estimation, is <20%. The left ventricle has severely decreased function. The left ventricle demonstrates global hypokinesis. The left ventricular internal cavity size was moderately to severely dilated. Left ventricular diastolic function could not be evaluated.  2. Right ventricular systolic function is mildly reduced. The right ventricular size is mildly enlarged. There is normal pulmonary artery systolic pressure.  3. Left atrial size was severely dilated.  4. Right atrial size was mildly dilated.  5. The mitral valve is degenerative. Mild mitral valve regurgitation.  6. The aortic valve is calcified. Aortic valve regurgitation is not visualized. No aortic stenosis is present. FINDINGS  Left Ventricle: The LV function is severely depressed. This is new compared to his previous echo from 2020. Left ventricular ejection fraction, by estimation, is <20%. The left ventricle has severely decreased function. The left ventricle demonstrates global hypokinesis. The left ventricular internal cavity size was moderately to severely dilated. There is no left ventricular hypertrophy. Left ventricular diastolic function could not be evaluated due to atrial fibrillation. Left ventricular diastolic function could not be evaluated. Right Ventricle: The right ventricular size is mildly enlarged. Right vetricular wall thickness was not well visualized. Right ventricular systolic function is mildly reduced. There is normal pulmonary artery systolic pressure. The tricuspid regurgitant velocity is 1.67 m/s, and with an assumed right atrial pressure of 3 mmHg, the estimated right ventricular systolic pressure is 73.2 mmHg. Left Atrium: Left atrial size was severely dilated. Right Atrium: Right atrial size was mildly dilated. Pericardium: There is no evidence of pericardial effusion. Mitral Valve: The mitral valve is degenerative in appearance. There is  moderate thickening of the mitral valve leaflet(s). Mild mitral valve regurgitation. Tricuspid Valve: The tricuspid valve is normal in structure. Tricuspid valve regurgitation is trivial. Aortic Valve: The aortic valve is calcified. Aortic valve regurgitation is not visualized. No aortic stenosis is present. Pulmonic Valve: The  pulmonic valve was normal in structure. Pulmonic valve regurgitation is not visualized. Aorta: The aortic root and ascending aorta are structurally normal, with no evidence of dilitation. IAS/Shunts: The atrial septum is grossly normal.  LEFT VENTRICLE PLAX 2D LVIDd:         6.50 cm LVIDs:         5.90 cm LV PW:         0.90 cm LV IVS:        0.80 cm LVOT diam:     2.20 cm LVOT Area:     3.80 cm  IVC IVC diam: 1.50 cm LEFT ATRIUM             Index        RIGHT ATRIUM           Index LA diam:        4.50 cm 2.65 cm/m   RA Area:     20.00 cm LA Vol (A2C):   73.6 ml 43.40 ml/m  RA Volume:   58.70 ml  34.62 ml/m LA Vol (A4C):   89.1 ml 52.54 ml/m LA Biplane Vol: 84.9 ml 50.07 ml/m   AORTA Ao Root diam: 3.30 cm Ao Asc diam:  2.90 cm TRICUSPID VALVE TR Peak grad:   11.2 mmHg TR Vmax:        167.00 cm/s  SHUNTS Systemic Diam: 2.20 cm Mertie Moores MD Electronically signed by Mertie Moores MD Signature Date/Time: 01/14/2022/4:27:00 PM    Final     Cardiac Studies   Relevant CV Studies:   Echo- From [facility] 01/11/22 Summary    1. The left ventricle is moderately dilated in size with decreased wall  thickness.    2. The left ventricular systolic function is severely decreased, LVEF is  visually estimated at 15-20%.    3. There is mild mitral valve regurgitation.    4. The aortic valve is trileaflet with mildly thickened leaflets with normal  excursion.    5. The left atrium is moderately dilated in size.    6. The right ventricle is normal in size, with normal systolic function.    Stress Test- From [facility] 01/10/22 1. No reversible ischemia. There are fixed defects  involving the  lateral and inferior wall compatible with infarcts involving the  left circumflex and RCA coronary artery distributions..   2. Left ventricular dilatation with severely reduced left  ventricular function..   3. Left ventricular ejection fraction 15%   4. Non invasive risk stratification*: High   Patient Profile     76 y.o. male with a hx of atrial fibrillation, HFrEF (15%), CAD, GERD, ischemic cardiomyopathy, HTN, HLDtobacco use disorder who is being seen 01/14/2022 for the evaluation of CHF at the request of Dr. Wynelle Cleveland.  Assessment & Plan    Chest Pain  HFrEF  - Stress Test from [facility] on 2/16 showed no reversible ischemia, fixed defects involving the lateral and inferior wall - Echocardiogram from [facility] on 2/17 showed EF 15-20% (decreased from 50% in 2020). Unable to see images, but able to see narrative.  - 2D echo here showed EF < 20% and severely dilated LV with dilated RV and mildly reduced RVF - HSTN 12>>11. BNP elevated to 905.0  - EKG showed afib, inverted T waves in V4-V6 - Likely that chest pain is related to severely reduced EF and possible underlying progression of CAD - Patient appears euvolemic on exam, no signs of pulmonary edema on CXR  -  continue on aspirin 81 mg daily, Toprol-XL and statin - Losartan on hold cath due to CKD - Consider transition from losartan to entresto post-cath if renal function remains stable post cath - Consider addition of Farxiga 10 mg daily and Spiro 12.5 mg daily post cath if renal function remains stable post cath - Due to patient's EF of 15%, would likely benefit from lifeVest>>order has been placed - Refer to advanced heart failure after discharge   Non-sustained V-tach  - Per chart review, was noted to possibly have V-tach episodes in the [facility] ER and 14 beats of V. tach on telemetry here yesterday - Patient reports some chest pain and palpitations around that time, unsure if caused by vtach  - Yesterday  K 4.3, mag 1.8. Primary team supplemented mag to maintain mag>2  - had another 10 beat run of NSVT this am - K+ 4.3 this am - repeat Mag today - Increase Toprol XL to 37.5mg  daily - Continue to monitor. - Given patient's low EF, would benefit from lifevest.    Permanent Atrial Fibrillation  - heart rate well controlled on Toprol - he is on Eliquis 5mg  BID at home but currently on hold for heart cath today - Continue on IV heparin   ASCAD -s/p MI with PCI LCx 2000 -cath 2014 with 10-20% LAD, 40-50% D1, 95-99% LCx at distal stent margin, 40% prox, 80% mid and 70% distal RCA and EF 35-40%>>s/p PCI of LCx -now with worsening LVF -for cath today -continue ASA 81mg  daily, Atorvastatin 40mg  daily, Toprol   HLD -LDL goal < 70 -LDL this admit 69 -continue Atorvastatin 40mg  daily but if found to have progression of CAD on cath will increase to 80mg  daily for new goal of < 55.  otherwise managed per primary  - UTI - GERD - Leukocytosis      For questions or updates, please contact [facility] Please consult [email or web address] for contact info under        Signed, Fransico Him, MD  01/16/2022, 8:49 AM

## 2022-01-16 NOTE — TOC Progression Note (Addendum)
Transition of Care [facility]) - Progression Note    Patient Details  Name: Ronald Mcdowell MRN: [id number] Date of Birth: Oct 07, 1946  [name] CM/SW Contact  Zenon Mayo, RN Phone Number: 01/16/2022, 11:21 AM  Clinical Narrative:    NCM faxed life vest order, demo, h/p, echo, progress note to [place].   Per Paulla Fore, patient will be getting a cath today and afterwards I will fax the cath report.  15:58- patient just went down for cath, not sure when he will be back to unit. NCM asked [facility] RN Velia to fax the cath results , gave her the fax cover sheet, so they can still get the results today.  Per Chrys Racer, the information that this NCM faxed previously went thru and was approved for life vest and he will be fitted later this evening, so no need to fax the cath report.         Expected Discharge Plan and Services                                                 Social Determinants of Health (SDOH) Interventions    Readmission Risk Interventions No flowsheet data found.

## 2022-01-16 NOTE — Plan of Care (Signed)

## 2022-01-16 NOTE — Progress Notes (Addendum)
Progress Note  Patient Name: RAYLYN SPECKMAN Date of Encounter: 01/16/2022  [facility] Cardiologist: Carlyle Dolly, MD   Subjective   No SOB or CP since I saw him yesterday  Inpatient Medications    Scheduled Meds:  aspirin EC  81 mg Oral Daily   atorvastatin  40 mg Oral QPM   metoprolol succinate  25 mg Oral Daily   nicotine  21 mg Transdermal Daily   pantoprazole  40 mg Oral Daily   sodium chloride flush  3 mL Intravenous Q12H   Continuous Infusions:  sodium chloride     sodium chloride 10 mL/hr at 01/16/22 0444   heparin 1,000 Units/hr (01/15/22 2032)   PRN Meds: sodium chloride, acetaminophen, baclofen, ondansetron (ZOFRAN) IV, sodium chloride flush   Vital Signs    Vitals:   01/15/22 1926 01/16/22 0400 01/16/22 0448 01/16/22 0802  BP: 117/77  114/80 120/82  Pulse: 90   86  Resp: 17  17 18   Temp: 98.1 F (36.7 C)  98 F (36.7 C) 97.9 F (36.6 C)  TempSrc: Oral  Oral Oral  SpO2: 100%  99% 98%  Weight:  62.5 kg    Height:        Intake/Output Summary (Last 24 hours) at 01/16/2022 0849 Last data filed at 01/16/2022 7342 Gross per 24 hour  Intake 323 ml  Output 750 ml  Net -427 ml    Last 3 Weights 01/16/2022 01/15/2022 01/14/2022  Weight (lbs) 137 lb 11.2 oz 137 lb 5.6 oz 139 lb 3.2 oz  Weight (kg) 62.46 kg 62.3 kg 63.141 kg      Telemetry    Atrial fibrillation with CVR and 10 beat run of NSVT- Personally Reviewed  ECG    No new EKG to review- Personally Reviewed  Physical Exam  GEN: Well nourished, well developed in no acute distress HEENT: Normal NECK: No JVD; No carotid bruits LYMPHATICS: No lymphadenopathy CARDIAC:irregularly irregular, no murmurs, rubs, gallops RESPIRATORY:  Clear to auscultation without rales, wheezing or rhonchi  ABDOMEN: Soft, non-tender, non-distended MUSCULOSKELETAL:  No edema; No deformity  SKIN: Warm and dry NEUROLOGIC:  Alert and oriented x 3 PSYCHIATRIC:  Normal affect   Labs    High Sensitivity  Troponin:   Recent Labs  Lab 01/13/22 0041 01/13/22 0217  TROPONINIHS 12 11       Chemistry Recent Labs  Lab 01/13/22 0041 01/14/22 0400 01/16/22 0417  NA 139 139 139  K 3.9 4.3 4.3  CL 104 104 106  CO2 29 28 24   GLUCOSE 98 93 93  BUN 25* 25* 15  CREATININE 1.52* 1.48* 1.30*  CALCIUM 8.2* 7.9* 7.6*  GFRNONAA 48* 49* 58*  ANIONGAP 6 7 9       Hematology Recent Labs  Lab 01/14/22 0400 01/15/22 0140 01/16/22 0417  WBC 15.2* 13.3* 12.4*  RBC 4.51 4.35 4.32  HGB 13.9 13.7 13.5  HCT 41.8 39.9 39.5  MCV 92.7 91.7 91.4  MCH 30.8 31.5 31.3  MCHC 33.3 34.3 34.2  RDW 15.1 15.2 15.2  PLT 165 145* 126*     BNP Recent Labs  Lab 01/13/22 0042  BNP 905.0*      DDimer No results for input(s): DDIMER in the last 168 hours.   CHA2DS2-VASc Score = 5   This indicates a 7.2% annual risk of stroke. The patient's score is based upon: CHF History: 1 HTN History: 1 Diabetes History: 0 Stroke History: 2 Vascular Disease History: 0 Age Score: 1 Gender Score: 0  Radiology    ECHOCARDIOGRAM COMPLETE  Result Date: 01/14/2022    ECHOCARDIOGRAM REPORT   Patient Name:   MELVYN HOMMES Date of Exam: 01/14/2022 Medical Rec #:  [id number]      Height:       65.0 in Accession #:    [id number]     Weight:       139.2 lb Date of Birth:  04-09-1946      BSA:          1.696 m Patient Age:    24 years       BP:           115/85 mmHg Patient Gender: M              HR:           91 bpm. Exam Location:  Inpatient Procedure: 2D Echo Indications:    acute systolic chf  History:        Patient has prior history of Echocardiogram examinations, most                 recent 10/20/2019. Cardiomyopathy, Arrythmias:Atrial                 Fibrillation and NSVT, Signs/Symptoms:Chest Pain; Risk                 Factors:Dyslipidemia and Current Smoker.  Sonographer:    Johny Chess RDCS Referring Phys: [id number] [place]  1. The LV function is severely depressed. This is new compared  to his previous echo from 2020.     [name] Left ventricular ejection fraction, by estimation, is <20%. The left ventricle has severely decreased function. The left ventricle demonstrates global hypokinesis. The left ventricular internal cavity size was moderately to severely dilated. Left ventricular diastolic function could not be evaluated.  2. Right ventricular systolic function is mildly reduced. The right ventricular size is mildly enlarged. There is normal pulmonary artery systolic pressure.  3. Left atrial size was severely dilated.  4. Right atrial size was mildly dilated.  5. The mitral valve is degenerative. Mild mitral valve regurgitation.  6. The aortic valve is calcified. Aortic valve regurgitation is not visualized. No aortic stenosis is present. FINDINGS  Left Ventricle: The LV function is severely depressed. This is new compared to his previous echo from 2020. Left ventricular ejection fraction, by estimation, is <20%. The left ventricle has severely decreased function. The left ventricle demonstrates global hypokinesis. The left ventricular internal cavity size was moderately to severely dilated. There is no left ventricular hypertrophy. Left ventricular diastolic function could not be evaluated due to atrial fibrillation. Left ventricular diastolic function could not be evaluated. Right Ventricle: The right ventricular size is mildly enlarged. Right vetricular wall thickness was not well visualized. Right ventricular systolic function is mildly reduced. There is normal pulmonary artery systolic pressure. The tricuspid regurgitant velocity is 1.67 m/s, and with an assumed right atrial pressure of 3 mmHg, the estimated right ventricular systolic pressure is 62.9 mmHg. Left Atrium: Left atrial size was severely dilated. Right Atrium: Right atrial size was mildly dilated. Pericardium: There is no evidence of pericardial effusion. Mitral Valve: The mitral valve is degenerative in appearance. There is  moderate thickening of the mitral valve leaflet(s). Mild mitral valve regurgitation. Tricuspid Valve: The tricuspid valve is normal in structure. Tricuspid valve regurgitation is trivial. Aortic Valve: The aortic valve is calcified. Aortic valve regurgitation is not visualized. No aortic stenosis is present. Pulmonic Valve: The  pulmonic valve was normal in structure. Pulmonic valve regurgitation is not visualized. Aorta: The aortic root and ascending aorta are structurally normal, with no evidence of dilitation. IAS/Shunts: The atrial septum is grossly normal.  LEFT VENTRICLE PLAX 2D LVIDd:         6.50 cm LVIDs:         5.90 cm LV PW:         0.90 cm LV IVS:        0.80 cm LVOT diam:     2.20 cm LVOT Area:     3.80 cm  IVC IVC diam: 1.50 cm LEFT ATRIUM             Index        RIGHT ATRIUM           Index LA diam:        4.50 cm 2.65 cm/m   RA Area:     20.00 cm LA Vol (A2C):   73.6 ml 43.40 ml/m  RA Volume:   58.70 ml  34.62 ml/m LA Vol (A4C):   89.1 ml 52.54 ml/m LA Biplane Vol: 84.9 ml 50.07 ml/m   AORTA Ao Root diam: 3.30 cm Ao Asc diam:  2.90 cm TRICUSPID VALVE TR Peak grad:   11.2 mmHg TR Vmax:        167.00 cm/s  SHUNTS Systemic Diam: 2.20 cm Mertie Moores MD Electronically signed by Mertie Moores MD Signature Date/Time: 01/14/2022/4:27:00 PM    Final     Cardiac Studies   Relevant CV Studies:   Echo- From [facility] 01/11/22 Summary    1. The left ventricle is moderately dilated in size with decreased wall  thickness.    2. The left ventricular systolic function is severely decreased, LVEF is  visually estimated at 15-20%.    3. There is mild mitral valve regurgitation.    4. The aortic valve is trileaflet with mildly thickened leaflets with normal  excursion.    5. The left atrium is moderately dilated in size.    6. The right ventricle is normal in size, with normal systolic function.    Stress Test- From [facility] 01/10/22 1. No reversible ischemia. There are fixed defects  involving the  lateral and inferior wall compatible with infarcts involving the  left circumflex and RCA coronary artery distributions..   2. Left ventricular dilatation with severely reduced left  ventricular function..   3. Left ventricular ejection fraction 15%   4. Non invasive risk stratification*: High   Patient Profile     76 y.o. male with a hx of atrial fibrillation, HFrEF (15%), CAD, GERD, ischemic cardiomyopathy, HTN, HLDtobacco use disorder who is being seen 01/14/2022 for the evaluation of CHF at the request of Dr. Wynelle Cleveland.  Assessment & Plan    Chest Pain  HFrEF  - Stress Test from [facility] on 2/16 showed no reversible ischemia, fixed defects involving the lateral and inferior wall - Echocardiogram from [facility] on 2/17 showed EF 15-20% (decreased from 50% in 2020). Unable to see images, but able to see narrative.  - 2D echo here showed EF < 20% and severely dilated LV with dilated RV and mildly reduced RVF - HSTN 12>>11. BNP elevated to 905.0  - EKG showed afib, inverted T waves in V4-V6 - Likely that chest pain is related to severely reduced EF and possible underlying progression of CAD - Patient appears euvolemic on exam, no signs of pulmonary edema on CXR  -  continue on aspirin 81 mg daily, Toprol-XL and statin - Losartan on hold cath due to CKD - Consider transition from losartan to entresto post-cath if renal function remains stable post cath - Consider addition of Farxiga 10 mg daily and Spiro 12.5 mg daily post cath if renal function remains stable post cath - Due to patient's EF of 15%, would likely benefit from lifeVest>>order has been placed - Refer to advanced heart failure after discharge   Non-sustained V-tach  - Per chart review, was noted to possibly have V-tach episodes in the [facility] ER and 14 beats of V. tach on telemetry here yesterday - Patient reports some chest pain and palpitations around that time, unsure if caused by vtach  - Yesterday  K 4.3, mag 1.8. Primary team supplemented mag to maintain mag>2  - had another 10 beat run of NSVT this am - K+ 4.3 this am - repeat Mag today - Increase Toprol XL to 37.5mg  daily - Continue to monitor. - Given patient's low EF, would benefit from lifevest.    Permanent Atrial Fibrillation  - heart rate well controlled on Toprol - he is on Eliquis 5mg  BID at home but currently on hold for heart cath today - Continue on IV heparin   ASCAD -s/p MI with PCI LCx 2000 -cath 2014 with 10-20% LAD, 40-50% D1, 95-99% LCx at distal stent margin, 40% prox, 80% mid and 70% distal RCA and EF 35-40%>>s/p PCI of LCx -now with worsening LVF -for cath today -continue ASA 81mg  daily, Atorvastatin 40mg  daily, Toprol   HLD -LDL goal < 70 -LDL this admit 69 -continue Atorvastatin 40mg  daily but if found to have progression of CAD on cath will increase to 80mg  daily for new goal of < 55.  otherwise managed per primary  - UTI - GERD - Leukocytosis      For questions or updates, please contact [facility] Please consult [email or web address] for contact info under        Signed, Fransico Him, MD  01/16/2022, 8:49 AM

## 2022-01-16 NOTE — CV Procedure (Signed)
70% mid RCA. Widely patent proximal to mid circumflex stent, diffuse disease in obtuse marginal branch, distally. Luminal irregularities in LAD. Left main is widely patent. Right heart pressures are normal.  Mean wedge pressure 11 mmHg.  Right atrial mean 1 mmHg.  LVEDP 6 mmHg.  Systolic heart failure, out of proportion to degree of coronary obstructive disease.

## 2022-01-16 NOTE — Progress Notes (Signed)
PROGRESS NOTE  OLUWATIMILEHIN BALFOUR FXT:[id number] DOB: 1946/01/07 DOA: 01/12/2022 PCP: Neale Burly, MD  Brief History   76 y.o. male with medical history significant of with history of atrial fibrillation, CHF with most recent ejection fraction showing 15%, coronary artery disease, GERD, ischemic cardiomyopathy, tobacco use disorder, and more presented to the [facility] ED on 2/18, on the same day he was discharged from [facility] with a chief complaint of shortness of breath and chest pain.     He was admitted at [facility] for chest pain. Stress test on 2/16 negative. Per phone conversation, Dr Harl Bowie, recommended Losartan, Toprol (stop Amlodipine, Bisoprolol, Levaquin, Lisinopril) and outpt f/u. He was also discharged on keflex for dysuria.    He had taken 1 dose of each of his new medications, Eliquis, Keflex, metoprolol since being home. Patient reports that at night he woke up out of sleep feeling shortness of breath with chest pain on the left side of his chest.  All symptoms resolved with nitro.    He was admitted to [facility] on 2/19 and transferred from [facility] to [facility] for a cardiology eval.    Cardiology is involved in the care of the patient.  The patient's most recent echo has demonstrated an EF of 15% compared to his previous EF of 50%. This is felt to be due to ischemic dilated cardiomyopathy.  He is receiving antibiotics for a UTI.  The patient will undergo a left heart cath today.  Consultants  Cardiology Heart failure team  Procedures  LHC today  Antibiotics   Anti-infectives (From admission, onward)    Start     Dose/Rate Route Frequency Ordered Stop   01/13/22 0600  cephALEXin (KEFLEX) capsule 500 mg        500 mg Oral Every 8 hours 01/13/22 0552 01/15/22 2032        Interval History/Subjective  The patient is resting comfortably in his bed. He complains of being hungry.  Objective   Vitals:  Vitals:   01/16/22 1641 01/16/22 1705  BP: (!) 118/91 113/80  Pulse:  (!) 0 87  Resp:  18  Temp:  98 F (36.7 C)  SpO2: 100% 98%    Exam:  Constitutional:  The patient is awake, alert, and oriented x 3. No acute distress. Respiratory:  No increased work of breathing. No wheezes, rales, or rhonchi No tactile fremitus Cardiovascular:  Regular rate and rhythm No murmurs, ectopy, or gallups. No lateral PMI. No thrills. Abdomen:  Abdomen is soft, non-tender, non-distended No hernias, masses, or organomegaly Normoactive bowel sounds.  Musculoskeletal:  No cyanosis, clubbing, or edema Skin:  No rashes, lesions, ulcers palpation of skin: no induration or nodules Neurologic:  CN 2-12 intact Sensation all 4 extremities intact Psychiatric:  Mental status Mood, affect appropriate Orientation to person, place, time  judgment and insight appear intact   I have personally reviewed the following:   Today's Data  Vitals  Lab Data  BMP  Micro Data    Imaging  CXR  Cardiology Data  EKG Echocardiogram  Other Data    Scheduled Meds:  apixaban  5 mg Oral BID   aspirin EC  81 mg Oral Daily   atorvastatin  40 mg Oral QPM   [START ON 01/17/2022] metoprolol succinate  37.5 mg Oral Daily   nicotine  21 mg Transdermal Daily   pantoprazole  40 mg Oral Daily   sodium chloride flush  3 mL Intravenous Q12H   sodium chloride flush  3  mL Intravenous Q12H   Continuous Infusions:  sodium chloride 100 mL/hr at 01/16/22 1717   sodium chloride      Principal Problem:   Chest pain Active Problems:   GERD (gastroesophageal reflux disease)   Ischemic heart disease due to coronary artery obstruction (HCC)   Permanent atrial fibrillation (HCC)   Hyperlipidemia   Tobacco use   UTI (urinary tract infection)   NSVT (nonsustained ventricular tachycardia)   Unstable angina (HCC)   Ischemic dilated cardiomyopathy (HCC)   LOS: 2 days    A & P  Chest pain- (present on admission) - has h/o prior infarcts and a stent - stress test neg on 2/16 at  [facility] for reversible ischemia, there were fixed defects - Cardiac cath planned for tomorrow   Ischemic heart disease due to coronary artery obstruction ([place])- (present on admission) NSVT -Last echo showed an ejection fraction of 15% compared to 50%   - - cont b blocker - will need life vest on dc     Ischemic dilated cardiomyopathy (HCC) EF 15-25% Management per cardiology   UTI (urinary tract infection)- (present on admission) - He states he has had dysuria for 3 wks and Keflex is the 3rd course of antibiotics as the other 2 did not work - he has no dysuria now - 5 days will be complete today   Tobacco use- (present on admission) Advised on the importance of cessation Cont nicotine patch   Hyperlipidemia- (present on admission) Continue statin   Permanent atrial fibrillation ([place])- (present on admission) - rate controlled- Eliquis held for cath - Continue metoprolol     GERD (gastroesophageal reflux disease)- (present on admission) Continue Protonix  I have seen and examined this patient myself. I have spent 32 minutes in his evaluation and care.  DVT prophylaxis: Apixaban Code Status: Full Code Family Communication: Spouse is at bedside. All questions answered to the best of my ability Disposition Plan: home    Felina Tello, DO [facility] Direct contact: see [email or web address]  7PM-7AM contact night coverage as above 01/16/2022, 9:31 PM  LOS: 2 days

## 2022-01-16 NOTE — TOC Benefit Eligibility Note (Signed)
Patient Teacher, English as a foreign language completed.    The patient is currently admitted and upon discharge could be taking Entresto 24-26 mg.  The current 30 day co-pay is, $45.00.   The patient is currently admitted and upon discharge could be taking Jardiance 10 mg.  The current 30 day co-pay is, $45.00.   The patient is currently admitted and upon discharge could be taking Farxiga 10 mg.  The current 30 day co-pay is, $95.00.   The patient is insured through [place], [place] Patient Advocate Specialist Christine Patient Advocate Team Direct Number: [phone]  Fax: [phone]

## 2022-01-17 ENCOUNTER — Encounter (HOSPITAL_COMMUNITY): Payer: Self-pay | Admitting: Interventional Cardiology

## 2022-01-17 ENCOUNTER — Other Ambulatory Visit (HOSPITAL_COMMUNITY): Payer: Self-pay

## 2022-01-17 ENCOUNTER — Other Ambulatory Visit: Payer: Self-pay | Admitting: Physician Assistant

## 2022-01-17 DIAGNOSIS — I240 Acute coronary thrombosis not resulting in myocardial infarction: Secondary | ICD-10-CM | POA: Diagnosis not present

## 2022-01-17 DIAGNOSIS — I255 Ischemic cardiomyopathy: Secondary | ICD-10-CM | POA: Diagnosis not present

## 2022-01-17 DIAGNOSIS — I4729 Other ventricular tachycardia: Secondary | ICD-10-CM | POA: Diagnosis not present

## 2022-01-17 DIAGNOSIS — K219 Gastro-esophageal reflux disease without esophagitis: Secondary | ICD-10-CM | POA: Diagnosis not present

## 2022-01-17 DIAGNOSIS — E7800 Pure hypercholesterolemia, unspecified: Secondary | ICD-10-CM | POA: Diagnosis not present

## 2022-01-17 DIAGNOSIS — I200 Unstable angina: Secondary | ICD-10-CM | POA: Diagnosis not present

## 2022-01-17 LAB — COMPREHENSIVE METABOLIC PANEL
ALT: 49 U/L — ABNORMAL HIGH (ref 0–44)
AST: 36 U/L (ref 15–41)
Albumin: 2.7 g/dL — ABNORMAL LOW (ref 3.5–5.0)
Alkaline Phosphatase: 49 U/L (ref 38–126)
Anion gap: 9 (ref 5–15)
BUN: 19 mg/dL (ref 8–23)
CO2: 23 mmol/L (ref 22–32)
Calcium: 7.5 mg/dL — ABNORMAL LOW (ref 8.9–10.3)
Chloride: 106 mmol/L (ref 98–111)
Creatinine, Ser: 1.57 mg/dL — ABNORMAL HIGH (ref 0.61–1.24)
GFR, Estimated: 46 mL/min — ABNORMAL LOW (ref >60)
Glucose, Bld: 86 mg/dL (ref 70–99)
Potassium: 4.4 mmol/L (ref 3.5–5.1)
Sodium: 138 mmol/L (ref 135–145)
Total Bilirubin: 0.6 mg/dL (ref 0.3–1.2)
Total Protein: 5.9 g/dL — ABNORMAL LOW (ref 6.5–8.1)

## 2022-01-17 LAB — CBC WITH DIFFERENTIAL/PLATELET
Abs Immature Granulocytes: 0.03 10*3/uL (ref 0.00–0.07)
Basophils Absolute: 0 10*3/uL (ref 0.0–0.1)
Basophils Relative: 0 %
Eosinophils Absolute: 0.2 10*3/uL (ref 0.0–0.5)
Eosinophils Relative: 2 %
HCT: 37.4 % — ABNORMAL LOW (ref 39.0–52.0)
Hemoglobin: 13 g/dL (ref 13.0–17.0)
Immature Granulocytes: 0 %
Lymphocytes Relative: 24 %
Lymphs Abs: 2.4 10*3/uL (ref 0.7–4.0)
MCH: 31.6 pg (ref 26.0–34.0)
MCHC: 34.8 g/dL (ref 30.0–36.0)
MCV: 90.8 fL (ref 80.0–100.0)
Monocytes Absolute: 1 10*3/uL (ref 0.1–1.0)
Monocytes Relative: 10 %
Neutro Abs: 6.5 10*3/uL (ref 1.7–7.7)
Neutrophils Relative %: 64 %
Platelets: 120 10*3/uL — ABNORMAL LOW (ref 150–400)
RBC: 4.12 MIL/uL — ABNORMAL LOW (ref 4.22–5.81)
RDW: 15.3 % (ref 11.5–15.5)
WBC: 10.2 10*3/uL (ref 4.0–10.5)
nRBC: 0 % (ref 0.0–0.2)

## 2022-01-17 LAB — CBC
HCT: 37.8 % — ABNORMAL LOW (ref 39.0–52.0)
Hemoglobin: 13.1 g/dL (ref 13.0–17.0)
MCH: 31.6 pg (ref 26.0–34.0)
MCHC: 34.7 g/dL (ref 30.0–36.0)
MCV: 91.1 fL (ref 80.0–100.0)
Platelets: 120 10*3/uL — ABNORMAL LOW (ref 150–400)
RBC: 4.15 MIL/uL — ABNORMAL LOW (ref 4.22–5.81)
RDW: 15.3 % (ref 11.5–15.5)
WBC: 10.1 10*3/uL (ref 4.0–10.5)
nRBC: 0 % (ref 0.0–0.2)

## 2022-01-17 LAB — FERRITIN: Ferritin: 183 ng/mL (ref 24–336)

## 2022-01-17 MED ORDER — SACUBITRIL-VALSARTAN 24-26 MG PO TABS
1.0000 | ORAL_TABLET | Freq: Two times a day (BID) | ORAL | Status: DC
  Administered 2022-01-17: 1 via ORAL
  Filled 2022-01-17: qty 1

## 2022-01-17 MED ORDER — NICOTINE 21 MG/24HR TD PT24
21.0000 mg | MEDICATED_PATCH | Freq: Every day | TRANSDERMAL | 0 refills | Status: DC
  Filled 2022-01-17: qty 28, 28d supply, fill #0

## 2022-01-17 MED ORDER — ATORVASTATIN CALCIUM 80 MG PO TABS: 80.0000 mg | ORAL_TABLET | Freq: Every evening | ORAL | Status: DC

## 2022-01-17 MED ORDER — METOPROLOL SUCCINATE ER 25 MG PO TB24
37.5000 mg | ORAL_TABLET | Freq: Every day | ORAL | 0 refills | Status: DC
  Filled 2022-01-17: qty 45, 30d supply, fill #0

## 2022-01-17 MED ORDER — SACUBITRIL-VALSARTAN 24-26 MG PO TABS
1.0000 | ORAL_TABLET | Freq: Two times a day (BID) | ORAL | 0 refills | Status: DC
  Filled 2022-01-17: qty 60, 30d supply, fill #0

## 2022-01-17 MED FILL — Verapamil HCl IV Soln 2.5 MG/ML: INTRAVENOUS | Qty: 2 | Status: AC
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
## 2022-01-17 NOTE — TOC Progression Note (Signed)
Transition of Care [facility]) - Progression Note    Patient Details  Name: Ronald Mcdowell MRN: [id number] Date of Birth: March 11, 1946  Transition of [facility]) CM/SW Contact  Zenon Mayo, RN Phone Number: 01/17/2022, 10:21 AM  Clinical Narrative:    Per Paulla Fore, he will be fitted for life vest later this afternoon.        Expected Discharge Plan and Services                                                 Social Determinants of Health (SDOH) Interventions    Readmission Risk Interventions No flowsheet data found.

## 2022-01-17 NOTE — Progress Notes (Signed)
BMP placed after entresto start

## 2022-01-17 NOTE — TOC Transition Note (Addendum)
Transition of Care [facility]) - CM/SW Discharge Note   Patient Details  Name: Ronald Mcdowell MRN: [id number] Date of Birth: 07-15-1946  [facility] of [facility]) CM/SW Contact:  Zenon Mayo, RN Phone Number: 01/17/2022, 12:03 PM   Clinical Narrative:    Patient is for dc today after life vest is fitted.  Wife is at the bedside to transport home.  Will be on new entresto , copay is 45.00 for refills.          Patient Goals and CMS Choice        Discharge Placement                       Discharge Plan and Services                                     Social Determinants of Health (SDOH) Interventions     Readmission Risk Interventions No flowsheet data found.

## 2022-01-17 NOTE — Progress Notes (Addendum)
Progress Note  Patient Name: Ronald Mcdowell Date of Encounter: 01/17/2022  [facility] Cardiologist: Carlyle Dolly, MD   Subjective   Denies any chest pain or SOB.  S/P Cath yesterday which showed 40% mLAD, diffuse dz in OM, 70% mid and 65% dRCA, normal LVEDP and PCWP with mixed venous O2 72%.    Inpatient Medications    Scheduled Meds:  apixaban  5 mg Oral BID   aspirin EC  81 mg Oral Daily   atorvastatin  40 mg Oral QPM   metoprolol succinate  37.5 mg Oral Daily   nicotine  21 mg Transdermal Daily   pantoprazole  40 mg Oral Daily   sodium chloride flush  3 mL Intravenous Q12H   sodium chloride flush  3 mL Intravenous Q12H   Continuous Infusions:  sodium chloride     PRN Meds: sodium chloride, acetaminophen, baclofen, ondansetron (ZOFRAN) IV, sodium chloride flush   Vital Signs    Vitals:   01/16/22 1641 01/16/22 1705 01/16/22 2014 01/17/22 0427  BP: (!) 118/91 113/80 111/78 117/84  Pulse: (!) 0 87 74 78  Resp:  18  18  Temp:  98 F (36.7 C) 98.2 F (36.8 C) 98.8 F (37.1 C)  TempSrc:  Oral Oral Oral  SpO2: 100% 98% 98% 97%  Weight:    61.1 kg  Height:        Intake/Output Summary (Last 24 hours) at 01/17/2022 1048 Last data filed at 01/17/2022 0831 Gross per 24 hour  Intake 720 ml  Output 250 ml  Net 470 ml    Last 3 Weights 01/17/2022 01/16/2022 01/15/2022  Weight (lbs) 134 lb 9.6 oz 137 lb 11.2 oz 137 lb 5.6 oz  Weight (kg) 61.054 kg 62.46 kg 62.3 kg      Telemetry    Atrial fibrillation with CVR and 10 beat run of NSVT- Personally Reviewed  ECG    No new EKG to review- Personally Reviewed  Physical Exam  GEN: Well nourished, well developed in no acute distress HEENT: Normal NECK: No JVD; No carotid bruits LYMPHATICS: No lymphadenopathy CARDIAC:irregularly irregular, no murmurs, rubs, gallops RESPIRATORY:  Clear to auscultation without rales, wheezing or rhonchi  ABDOMEN: Soft, non-tender, non-distended MUSCULOSKELETAL:  No edema; No  deformity  SKIN: Warm and dry NEUROLOGIC:  Alert and oriented x 3 PSYCHIATRIC:  Normal affect   Labs    High Sensitivity Troponin:   Recent Labs  Lab 01/13/22 0041 01/13/22 0217  TROPONINIHS 12 11       Chemistry Recent Labs  Lab 01/14/22 0400 01/16/22 0417 01/16/22 1632 01/16/22 1635 01/17/22 0426  NA 139 139 143   143 142 138  K 4.3 4.3 4.4   4.4 4.4 4.4  CL 104 106  --   --  106  CO2 28 24  --   --  23  GLUCOSE 93 93  --   --  86  BUN 25* 15  --   --  19  CREATININE 1.48* 1.30*  --   --  1.57*  CALCIUM 7.9* 7.6*  --   --  7.5*  PROT  --   --   --   --  5.9*  ALBUMIN  --   --   --   --  2.7*  AST  --   --   --   --  36  ALT  --   --   --   --  49*  ALKPHOS  --   --   --   --  86  BILITOT  --   --   --   --  0.6  GFRNONAA 49* 58*  --   --  46*  ANIONGAP 7 9  --   --  9      Hematology Recent Labs  Lab 01/15/22 0140 01/16/22 0417 01/16/22 1632 01/16/22 1635 01/17/22 0426  WBC 13.3* 12.4*  --   --  10.2   10.1  RBC 4.35 4.32  --   --  4.12*   4.15*  HGB 13.7 13.5 13.9   13.9 13.6 13.0   13.1  HCT 39.9 39.5 41.0   41.0 40.0 37.4*   37.8*  MCV 91.7 91.4  --   --  90.8   91.1  MCH 31.5 31.3  --   --  31.6   31.6  MCHC 34.3 34.2  --   --  34.8   34.7  RDW 15.2 15.2  --   --  15.3   15.3  PLT 145* 126*  --   --  120*   120*     BNP Recent Labs  Lab 01/13/22 0042  BNP 905.0*      DDimer No results for input(s): DDIMER in the last 168 hours.   CHA2DS2-VASc Score = 5   This indicates a 7.2% annual risk of stroke. The patient's score is based upon: CHF History: 1 HTN History: 1 Diabetes History: 0 Stroke History: 2 Vascular Disease History: 0 Age Score: 1 Gender Score: 0  Radiology    CARDIAC CATHETERIZATION  Result Date: 01/16/2022 CONCLUSIONS: Luminal irregularities in the left main Luminal irregularities with up to 40% eccentric mid LAD Widely patent mid circumflex stent.  Diffuse disease in branches in the still second obtuse marginal.  Dominant right coronary, mid eccentric 70% stenosis, distal 65% stenosis. Normal LVEDP Normal capillary wedge pressure Mixed venous O2 saturation 72% RECOMMENDATIONS: Systolic dysfunction is out of proportion to the degree of coronary disease. Guideline directed therapy for systolic heart failure. Resume apixaban in 8 hours.    Cardiac Studies   Relevant CV Studies:   Cardiac Cath 01/16/2022 Conclusion  CONCLUSIONS: Luminal irregularities in the left main Luminal irregularities with up to 40% eccentric mid LAD Widely patent mid circumflex stent.  Diffuse disease in branches in the still second obtuse marginal. Dominant right coronary, mid eccentric 70% stenosis, distal 65% stenosis. Normal LVEDP Normal capillary wedge pressure Mixed venous O2 saturation 72%   RECOMMENDATIONS: Systolic dysfunction is out of proportion to the degree of coronary disease. Guideline directed therapy for systolic heart failure. Resume apixaban in 8 hours.   Echo- From [facility] 01/11/22 Summary    1. The left ventricle is moderately dilated in size with decreased wall  thickness.    2. The left ventricular systolic function is severely decreased, LVEF is  visually estimated at 15-20%.    3. There is mild mitral valve regurgitation.    4. The aortic valve is trileaflet with mildly thickened leaflets with normal  excursion.    5. The left atrium is moderately dilated in size.    6. The right ventricle is normal in size, with normal systolic function.    Stress Test- From [facility] 01/10/22 1. No reversible ischemia. There are fixed defects involving the  lateral and inferior wall compatible with infarcts involving the  left circumflex and RCA coronary artery distributions..   2. Left ventricular dilatation with severely reduced left  ventricular function..   3. Left ventricular ejection fraction 15%  4. Non invasive risk stratification*: High     Patient Profile     76 y.o. male  with a hx of atrial fibrillation, HFrEF (15%), CAD, GERD, ischemic cardiomyopathy, HTN, HLDtobacco use disorder who is being seen 01/14/2022 for the evaluation of CHF at the request of Dr. Wynelle Cleveland.  Assessment & Plan    Chest Pain  HFrEF  - Stress Test from [facility] on 2/16 showed no reversible ischemia, fixed defects involving the lateral and inferior wall - Echocardiogram from [facility] on 2/17 showed EF 15-20% (decreased from 50% in 2020). Unable to see images, but able to see narrative.  - 2D echo here showed EF < 20% and severely dilated LV with dilated RV and mildly reduced RVF - HSTN 12>>11. BNP elevated to 905.0  - EKG showed afib, inverted T waves in V4-V6 - cath with borderline obstructive dz of the mid and distal RCA and non obstructive dz of the LAD - Euvolemic on exam and by cath with normal LVEDP and PCWP - DCM out or proportion to DCM so likely nonischemic in etiology - continue on aspirin 81 mg daily, Toprol-XL and statin - SCr bumped post cath from 1.3 to 1.57 (1.5 appears to be baseline>>Jan 2023) - will change home Losartan to Entresto 24-26mg  BID - add Jardiance 10mg  daily - no diuretics at discharge as he is not volume overloaded - Consider addition of  Spiro 12.5 mg daily as outpt if renal function remains stable on Entresto - Due to patient's EF of 15%, would likely benefit from lifeVest>>will be placed today - will check Ferritin, SPEP, UPEP for secondary causes of DCM - HIV negative - consider outpt workup for Amyloid with hx of CKD - Refer to advanced heart failure after discharge   Non-sustained V-tach  - Per chart review, was noted to possibly have V-tach episodes in the [facility] ER and 14 beats of V. tach on telemetry here yesterday - Patient reports some chest pain and palpitations around that time, unsure if caused by vtach  - had another 10 beat run of NSVT yesterday but asymptomatic - K+ 3.5 this am - need to keep K+>4 and Mag>2 - continue Toprol  XL 37.5mg  daily - getting Life Vest today   Permanent Atrial Fibrillation  - heart rate well controlled on Toprol - continue Eliquis 5mg  BID, Toprol XL 37.5mg  daily  ASCAD -s/p MI with PCI LCx 2000 -cath 2014 with 10-20% LAD, 40-50% D1, 95-99% LCx at distal stent margin, 40% prox, 80% mid and 70% distal RCA and EF 35-40%>>s/p PCI of LCx -Cath yesterday which showed 40% mLAD, patent mid LCx stent with 20% ISR, moderate diffuse dz in OM1 and OM2, 70% proximal and 65% mid to dRCA, normal LVEDP and PCWP with mixed venous O2 72%.  -continue medical management -continue ASA 81mg  daily, Atorvastatin, Toprol 37.5mg  daily  HLD -LDL goal < 55 -LDL this admit 69 -increase Atorvastatin to 80mg  daily -check FLP and ALT In 8 weeks  otherwise managed per primary  - UTI - GERD - Leukocytosis   I have spent a total of 40 minutes with patient reviewing cardiac cath , telemetry, EKGs, labs and examining patient as well as establishing an assessment and plan that was discussed with the patient.  > 50% of time was spent in direct patient care.      [facility] will sign off.   Medication Recommendations:  ASA 81mg  daily, Eliquis 5mg  BID, Atorvastatin 80mg  daily, Toprol XL 37.5mg  daily, Jardiance  10mg  daily and Entresto 24-26mg  BID Other recommendations (labs, testing, etc):  BMET on Monday Follow up as an outpatient:  [facility] on 3/1, Dr. Harl Bowie 3/14   For questions or updates, please contact [facility] Please consult [email or web address] for contact info under        Signed, Fransico Him, MD  01/17/2022, 10:48 AM

## 2022-01-17 NOTE — Progress Notes (Addendum)
Went over discharge instructions with the patient and patient's wife at the bedside. RN provided education on smoking cessation and the importance in regards to cardiac history. Patient provided education on post TR band care and keep the gauze clean and dry until tomorrow morning. Patient verbalize understanding. Patient and wife made aware of follow up appointments and medication changes. Patient verbalize no further questions. NT removed PIV and tele-monitor. CCMD notified of patient's discharge. Transport is in the room. Awaiting TOC meds.

## 2022-01-17 NOTE — Plan of Care (Signed)
  Problem: Education: Goal: Knowledge of General Education information will improve Description Including pain rating scale, medication(s)/side effects and non-pharmacologic comfort measures Outcome: Progressing   Problem: Health Behavior/Discharge Planning: Goal: Ability to manage health-related needs will improve Outcome: Progressing   

## 2022-01-17 NOTE — Progress Notes (Signed)
Heart Failure Stewardship Pharmacist Progress Note   PCP: Neale Burly, MD PCP-Cardiologist: Carlyle Dolly, MD    HPI:  76 yo M with PMH of afib, CHF, ICM, CAD, GERD, HTN, HLD, and tobacco use. He presented to [facility] on 2/15 with chest pain and shortness of breath. ECHO there showed LVEF of 15% and no ischemia on stress test from 2/16. He was discharged on 2/18 and then came to [facility] ED on the same day with continued symptoms. He was transferred to [facility] for further cardiac evaluation. CXR negative. ECHO done on 2/20 and LVEF is <20% with mildly reduced RV. R/LHC with borderline obstructive disease of RCA and nonobstructive disease of LAD with normal wedge pressure. CAD thought to be out of proportion to CAD.   Current HF Medications: Beta Blocker: metoprolol XL 25 mg daily ACE/ARB/ARNI: Entresto 24/26 mg BID  Prior to admission HF Medications: Diuretic: furosemide 20 mg PRN Beta blocker: metoprolol XL 25 mg daily ACE/ARB/ARNI: losartan 25 mg daily  Pertinent Lab Values: Serum creatinine 1.57, BUN 19, Potassium 4.4, Sodium 138, BNP 905, Magnesium 1.6  Vital Signs: Weight: 134 lbs (admission weight: 136 lbs) Blood pressure: 110/80s  Heart rate: 80s   Medication Assistance / Insurance Benefits Check: Does the patient have prescription insurance?  Yes Type of insurance plan: [employer]  Outpatient Pharmacy:  Prior to admission outpatient pharmacy: [facility] Is the patient willing to use [place] pharmacy at discharge? Yes Is the patient willing to transition their outpatient pharmacy to utilize a [facility] outpatient pharmacy?   Pending    Assessment: 1. Acute on chronic systolic CHF (EF <50%), due to past ICM - LHC this admission with CAD out of proportion to LV dysfunction. NYHA class II symptoms. - Continue metoprolol XL 25 mg daily - Agree with starting Entresto 24/26 mg BID - Consider adding spironolactone prior to discharge - Consider adding Jardiance 10 mg  daily prior to discharge   Plan: 1) Medication changes recommended at this time: - Agree with changes as above; add Jardiance 10 mg daily prior to discharge   2) Patient assistance: Delene Loll copay $45 - Jardiance coapy $45 Wilder Glade copay $95  3)  Education  - To be completed prior to discharge  Kerby Nora, PharmD, BCPS Heart Failure Stewardship Pharmacist Phone [phone]

## 2022-01-17 NOTE — Assessment & Plan Note (Signed)
LHC performed on 01/16/2022 demonstrated  40% mLAD, patent mid LCx stent with 20% ISR, moderate diffuse dz in OM1 and OM2, 70% proximal and 65% mid to dRCA, normal LVEDP and PCWP with mixed venous O2 72%.   Patient will be discharged on a slightly increased dose of metoprolol.

## 2022-01-21 DIAGNOSIS — I429 Cardiomyopathy, unspecified: Secondary | ICD-10-CM | POA: Diagnosis not present

## 2022-01-23 ENCOUNTER — Other Ambulatory Visit: Payer: Self-pay

## 2022-01-23 ENCOUNTER — Ambulatory Visit (HOSPITAL_COMMUNITY)
Admit: 2022-01-23 | Discharge: 2022-01-23 | Disposition: A | Discharge status: Home / Self Care | Payer: Medicare HMO | Attending: Cardiology | Admitting: Cardiology

## 2022-01-23 ENCOUNTER — Encounter (HOSPITAL_COMMUNITY): Payer: Self-pay

## 2022-01-23 VITALS — BP 120/80 | HR 87 | Wt 139.4 lb

## 2022-01-23 DIAGNOSIS — I428 Other cardiomyopathies: Secondary | ICD-10-CM | POA: Insufficient documentation

## 2022-01-23 DIAGNOSIS — N1832 Chronic kidney disease, stage 3b: Secondary | ICD-10-CM | POA: Diagnosis not present

## 2022-01-23 DIAGNOSIS — I2510 Atherosclerotic heart disease of native coronary artery without angina pectoris: Secondary | ICD-10-CM | POA: Insufficient documentation

## 2022-01-23 DIAGNOSIS — F17210 Nicotine dependence, cigarettes, uncomplicated: Secondary | ICD-10-CM | POA: Diagnosis not present

## 2022-01-23 DIAGNOSIS — I472 Ventricular tachycardia, unspecified: Secondary | ICD-10-CM | POA: Insufficient documentation

## 2022-01-23 DIAGNOSIS — Z87440 Personal history of urinary (tract) infections: Secondary | ICD-10-CM | POA: Insufficient documentation

## 2022-01-23 DIAGNOSIS — Z7901 Long term (current) use of anticoagulants: Secondary | ICD-10-CM | POA: Insufficient documentation

## 2022-01-23 DIAGNOSIS — I4819 Other persistent atrial fibrillation: Secondary | ICD-10-CM | POA: Insufficient documentation

## 2022-01-23 DIAGNOSIS — Z7982 Long term (current) use of aspirin: Secondary | ICD-10-CM | POA: Diagnosis not present

## 2022-01-23 DIAGNOSIS — Z79899 Other long term (current) drug therapy: Secondary | ICD-10-CM | POA: Insufficient documentation

## 2022-01-23 DIAGNOSIS — Z955 Presence of coronary angioplasty implant and graft: Secondary | ICD-10-CM | POA: Diagnosis not present

## 2022-01-23 DIAGNOSIS — I5022 Chronic systolic (congestive) heart failure: Secondary | ICD-10-CM

## 2022-01-23 DIAGNOSIS — I5082 Biventricular heart failure: Secondary | ICD-10-CM | POA: Diagnosis not present

## 2022-01-23 LAB — PROTEIN ELECTROPHORESIS, SERUM
A/G Ratio: 0.9 (ref 0.7–1.7)
Albumin ELP: 3.1 g/dL (ref 2.9–4.4)
Alpha-1-Globulin: 0.3 g/dL (ref 0.0–0.4)
Alpha-2-Globulin: 1 g/dL (ref 0.4–1.0)
Beta Globulin: 0.8 g/dL (ref 0.7–1.3)
Gamma Globulin: 1.4 g/dL (ref 0.4–1.8)
Globulin, Total: 3.5 g/dL (ref 2.2–3.9)
Total Protein ELP: 6.6 g/dL (ref 6.0–8.5)

## 2022-01-23 MED ORDER — TORSEMIDE 20 MG PO TABS: 20.0000 mg | ORAL_TABLET | Freq: Every day | ORAL | 3 refills | Status: DC

## 2022-01-23 MED ORDER — SPIRONOLACTONE 25 MG PO TABS: 12.5000 mg | ORAL_TABLET | Freq: Every day | ORAL | 0 refills | Status: DC

## 2022-01-23 NOTE — Progress Notes (Signed)
HEART & VASCULAR TRANSITION OF CARE CONSULT NOTE     Referring Physician: Dr. Radford Pax  Primary Care: Neale Burly, MD Primary Cardiologist: Carlyle Dolly, MD   HPI: Referred to clinic by Dr. Radford Pax for heart failure consultation.   76 y/o male w/ newly diagnosed systolic heart failure. Has prior h/o CAD w/ remote PCI to LCx in 2014 and medically managnaged RCA disease. Also h/o persistent afib. Echo in 2016 showed mildly reduced LVEF, 45-50%. Repeat echo 09/2019 EF 50%, RV normal.   Recently admitted 2/23 w/ acute HF. He was in Afib, but V-rates noted to be decently controlled. Echo showed severely reduced LVEF,  LV severely dilated, EF <20% w/ global HK, RV mildy enlarged w/ mildly reduced systolic function. No LVH. Only mild MR and trivial TR. Had subsequent R/LHC which showed only mild-mod nonobstructive CAD w/ widely patent, previously placed LCx stent. RCA dominant w/  70% stenosis in mid portion and 65% dRCA stenosis. Systolic function out of proportion to degree of CAD. RHC showed low filling pressures, mRAP 1, mPAP 15, mPCWP 11 and Preserved cardiac output, Fick CO 4.38 L/min, CI 2.59 L/min. He was placed on GDMT but no loop diuretic. LifeVest was also placed for low EF and frequent NSVT. cMRI was not performed. Referred to TOC. D/w wt 134 lb.   Presents to clinic today for f/u.  Here w/ his wife. Wearing LifeVest. Interrogation reviewed, no alarms or treatment. Also just had recent f/u labs at [facility] 2 days ago, BMP was done. Results reviewed, K 4.3, Scr 1.81 (baseline ~1.6) GFR 39.  He complains of exertional dyspnea, NYHA Class II-III. No chest pain. Wt is up 5 lb since d/c, from 134>>139 lb today. ReDS clip 39%. BP 120/80.  Notes some positional dizziness since starting Entresto. No syncope/ near syncope.    He has had recent history of recurrent UTIs, at least 2 UTIs in the last 6 wks.   He denies ETOH use. Former smoker, recently quit since hospitalization. +  family history of CHF (father and paternal aunt). Recent history of suspected PNA ~4 weeks ago. Per chart review, he has been in persistent Afib, since at least September 2022, rate controlled. No attempt at DCCV. EKG July 2021 showed NSR.   He is still working. Works in [employer] on [employer]. Needs help w/ disability paperwork.    Cardiac Testing   2D Echo 01/14/22  The LV function is severely depressed. This is new compared to his previous echo from 2020. [name] Left ventricular ejection fraction, by estimation, is <20%. The left ventricle has severely decreased function. The left ventricle demonstrates global hypokinesis. The left ventricular internal cavity size was moderately to severely dilated. Left ventricular diastolic function could not be evaluated. 1. Right ventricular systolic function is mildly reduced. The right ventricular size is mildly enlarged. There is normal pulmonary artery systolic pressure. 2. 3. Left atrial size was severely dilated. 4. Right atrial size was mildly dilated. 5. The mitral valve is degenerative. Mild mitral valve regurgitation. The aortic valve is calcified. Aortic valve regurgitation is not visualized. No aortic stenosis is present.  R/LHC 01/16/22 CONCLUSIONS: Luminal irregularities in the left main Luminal irregularities with up to 40% eccentric mid LAD Widely patent mid circumflex stent.  Diffuse disease in branches in the still second obtuse marginal. Dominant right coronary, mid eccentric 70% stenosis, distal 65% stenosis. Normal LVEDP Normal capillary wedge pressure Mixed venous O2 saturation 72%   RECOMMENDATIONS: Systolic dysfunction is out  of proportion to the degree of coronary disease.  Diagnostic Dominance: Right Intervention      Review of Systems: [y] = yes, [ ]  = no   General: Weight gain [ Y]; Weight loss [ ] ; Anorexia [ ] ; Fatigue [ Y]; Fever [ ] ; Chills [ ] ; Weakness [ ]   Cardiac: Chest pain/pressure [ ] ; Resting SOB  [ ] ; Exertional SOB [ Y]; Orthopnea [ ] ; Pedal Edema [ ] ; Palpitations [ ] ; Syncope [ ] ; Presyncope [ ] ; Paroxysmal nocturnal dyspnea[ ]   Pulmonary: Cough [ ] ; Wheezing[ ] ; Hemoptysis[ ] ; Sputum [ ] ; Snoring [ ]   GI: Vomiting[ ] ; Dysphagia[ ] ; Melena[ ] ; Hematochezia [ ] ; Heartburn[ ] ; Abdominal pain [ ] ; Constipation [ ] ; Diarrhea [ ] ; BRBPR [ ]   GU: Hematuria[ ] ; Dysuria [ ] ; Nocturia[ ]   Vascular: Pain in legs with walking [ ] ; Pain in feet with lying flat [ ] ; Non-healing sores [ ] ; Stroke [ ] ; TIA [ ] ; Slurred speech [ ] ;  Neuro: Headaches[ ] ; Vertigo[ ] ; Seizures[ ] ; Paresthesias[ ] ;Blurred vision [ ] ; Diplopia [ ] ; Vision changes [ ]   Ortho/Skin: Arthritis [ ] ; Joint pain [ ] ; Muscle pain [ ] ; Joint swelling [ ] ; Back Pain [ ] ; Rash [ ]   Psych: Depression[ ] ; Anxiety[ ]   Heme: Bleeding problems [ ] ; Clotting disorders [ ] ; Anemia [ ]   Endocrine: Diabetes [ ] ; Thyroid dysfunction[ ]    Past Medical History:  Diagnosis Date   Anemia    Atrial fibrillation (HCC)    CHF (congestive heart failure) (HCC)    Chronic kidney disease, unspecified 01/14/2020   Coronary artery disease    a. h/o MI in 2000 w/ prior LCX stenting;  b. 7.2014 Abnl Cardiolite, EF 41%, mod-large inferolat scar;  c. 07/2013 Cath/PCI: LM nl, LAD 10-20, D1 40-50p, LCX 95-99 @ distal stent margin (2.5x20 Promus Premier DES), RCA dom, 40p, 101m, 70d, EF 35-40%.   Elevated cholesterol    GERD (gastroesophageal reflux disease)    Graves disease 04/2020   s/p I-131 thyroid ablation    Hemorrhoids    Hypertension    Hyperthyroidism 09/27/2020   Ischemic cardiomyopathy    a. 07/2013 EF 35-40% by LV gram.; EF 50% on ECHO in November 2020   Myocardial infarction [facility])    Reflux esophagitis    Spondylosis of cervical joint    C3-4, C6-7   Tobacco user    Vitamin D deficiency disease 08/24/2019    Current Outpatient Medications  Medication Sig Dispense Refill   apixaban (ELIQUIS) 5 MG TABS tablet TAKE 1 TABLET(5 MG) BY  MOUTH TWICE DAILY 180 tablet 2   aspirin 81 MG EC tablet Take 81 mg by mouth daily.     atorvastatin (LIPITOR) 80 MG tablet Take 1 tablet (80 mg total) by mouth daily. 90 tablet 3   baclofen (LIORESAL) 10 MG tablet Take 10 mg by mouth 3 (three) times daily as needed.     Cholecalciferol (VITAMIN D-3) 125 MCG (5000 UT) TABS Take 1 tablet by mouth daily.     metoprolol succinate (TOPROL-XL) 25 MG 24 hr tablet Take 25 mg by mouth daily.     nitroGLYCERIN (NITROSTAT) 0.4 MG SL tablet Place 1 tablet (0.4 mg total) under the tongue every 5 (five) minutes as needed for chest pain. 30 tablet 3   pantoprazole (PROTONIX) 40 MG tablet Take 1 tablet (40 mg total) by mouth daily. 90 tablet 3   sacubitril-valsartan (ENTRESTO) 24-26 MG Take 1 tablet by mouth 2 (  two) times daily. 60 tablet 0   spironolactone (ALDACTONE) 25 MG tablet Take 0.5 tablets (12.5 mg total) by mouth daily. 45 tablet 0   torsemide (DEMADEX) 20 MG tablet Take 1 tablet (20 mg total) by mouth daily. 90 tablet 3   No current facility-administered medications for this encounter.    Allergies  Allergen Reactions   Peanut Butter Flavor Swelling      Social History   Socioeconomic History   Marital status: Married    Spouse name: [name]   Number of children: Not on file   Years of education: 11th grade   Highest education level: 11th grade  Occupational History   Occupation: Librarian, academic  Tobacco Use   Smoking status: Every Day    Packs/day: 1.00    Years: 53.00    Pack years: 53.00    Types: Cigarettes    Start date: 12/09/1965   Smokeless tobacco: Never   Tobacco comments:    3/4 pack of cigarettes daily; smoked since 76 years old  Vaping Use   Vaping Use: Never used  Substance and Sexual Activity   Alcohol use: No    Alcohol/week: 0.0 standard drinks    Comment: No bowel since year 2000.  Some previous heavy use.   Drug use: No   Sexual activity: Yes    Birth control/protection: None  Other Topics Concern   Not on  file  Social History Narrative   Pt gets regular exercise.Married for 22 years.Retired but still Dentist.   Social Determinants of Radio broadcast assistant Strain: Not on file  Food Insecurity: Not on file  Transportation Needs: Not on file  Physical Activity: Not on file  Stress: Not on file  Social Connections: Not on file  Intimate Partner Violence: Not on file      Family History  Problem Relation Age of Onset   Early death Mother        childbirth   Heart attack Father    Cancer Father    Heart disease Brother        CABG   Heart disease Brother        slight heart attack   Anesthesia problems Neg Hx    Hypotension Neg Hx    Malignant hyperthermia Neg Hx    Pseudochol deficiency Neg Hx    Colon cancer Neg Hx    Liver disease Neg Hx    Gastric cancer Neg Hx    Esophageal cancer Neg Hx     Vitals:   01/23/22 1506  BP: 120/80  Pulse: 87  SpO2: 100%  Weight: 63.2 kg (139 lb 6.4 oz)    PHYSICAL EXAM: ReDs Clip 39%  General:  Well appearing. No respiratory difficulty + LifeVest  HEENT: normal Neck: supple. JVD 8 cm. Carotids 2+ bilat; no bruits. No lymphadenopathy or thryomegaly appreciated. Cor: PMI nondisplaced. Irregularly irregular rhythm and rate. No rubs, gallops or murmurs. Lungs: clear Abdomen: soft, nontender, nondistended. No hepatosplenomegaly. No bruits or masses. Good bowel sounds. Extremities: no cyanosis, clubbing, rash, edema, warm  Neuro: alert & oriented x 3, cranial nerves grossly intact. moves all 4 extremities w/o difficulty. Affect pleasant.  ECG: Not performed    ASSESSMENT & PLAN:  1. Chronic Biventricular Heart Failure - new diagnosis. Echo 09/2019 EF 50%, RV normal - Echo 2/23: LV severely dilated, EF <20%, RV mildly dilated, mildly reduced systolic function - NICM. LHC 2/23 w/ mod nonobstructive CAD. CM out of proportion to degree of CAD -  RHC w/ low filling pressures and preserved output, Fick CO  4.38 L/min, CI 2.59 L/min - Etiology uncertain. No ETOH use. BP not markedly high during recent admission - ? Viral CM, Had suspected PNA 3-4 weeks prior to HF admission. Also ? Familial given FH. Potential tachymediated CM in setting of persistent afib, though V-rates not markedly high   - will order cMRI to r/o myocarditis as well as infiltrative CM (?sarcoid or amyloid)  - NYHA Class II-III. Mildly fluid overloaded, Wt up 5 lb post hospital. ReDs Clip 39%. BMP yesterday showed SCr 1.8 (baseline 1.6), K 4.3  - No SGLTi given h/o recurrent UTIs (2 in the last 6 wks) - Continue Entresto 24-26 mg bid (will not titrate today given recent positional dizziness) - Add Spiro 12.5 mg daily  - Add torsemide 20 mg daily - Continue Toprol XL 25 mg daily  - F/u BMP in 1 week  - continue LifeVest for primary prevention (frequent NSVT noted during hospitalization). Will need repeat 2D echo after 3 months of maximally tolerated GDMT. If EF remains < 35%, will need referral for ICD. Not candidate for CRT-D given narrow QRS.  - Likely out of age range for cardiac transplant, but may be potential VAD candidate. Will follow closely. Advised to quit smoking.  - Refer to the [facility]. Assign to Dr. Aundra Dubin   2. CAD - h/o LCx stenting in 2014 - recent [place] 2/23 w/ mod nonob disease, mLAD 40% stenosed, LCx stent widely patent, 70% mRCA, 60% dRCA, treated medically - stable w/o CP - continue ASA 81 mg  - on statin therapy, Atorva 80. LDL at goal, 69 mg/dL  - continue ? blocker  3. NSVT - noted on recent hospitalization - c/w LifeVest. Device interrogation shows no alarms/ therapies   4. Persistent Atrial Fibrillation - per review of EKGs, has possibly been in Afib since at least 9/22. No attempted DCCV as rates have been fairly well controlled, in 80s today  - on Eliquis 5 mg bid  - Will need attempt at DCCV once better diuresed. Will discuss at next visit   5. CKD, Stage IIIa-b - baseline SCr ~1.6. Last GFR 46   - Scr 1.8 on BMP yesterday  - Ideally, would use SGLT2i, however avoiding w/ h/o recurrent UTIs  - Check BMP in 1 wk w/ diuretic increase   6. Tobacco Abuse - recently quit 2/23   NYHA II-III GDMT  Diuretic- Torsemide 20 mg daily  BB- Toprol XL 25 mg daily  Ace/ARB/ARNI Entresto 24-26 mg bid  MRA Spiro 12.5 mg daily  SGLT2i - no, h/o frequent UTIs     Referred to HFSW (PCP, Medications, Transportation, ETOH Abuse, Drug Abuse, Insurance, Financial ): Yes  Refer to Pharmacy:  No Refer to Home Health: n No Refer to Advanced Heart Failure Clinic: Yes (assign to Dr. Aundra Dubin)  Refer to General Cardiology: Yes (shared care, Dr. Zandra Abts)   Follow up  w/ Dr. Aundra Dubin in 2-3 weeks.   Lyda Jester, PA-C

## 2022-01-23 NOTE — Progress Notes (Signed)
ReDS Vest / Clip - 01/23/22 1506   ? ?  ? ReDS Vest / Clip  ? Station Marker A   ? Ruler Value 28   ? ReDS Value Range Moderate volume overload   ? ReDS Actual Value 39   ? ?  ?  ? ?  ? ? ?

## 2022-01-23 NOTE — Patient Instructions (Addendum)
Good to see you today! ? ?Start Spironolactone 12.5 mg (1/2 tablet) daily ? ?Start Torsemide 20 mg daily ? ?Follow up lab work in a week ? ?Your physician has requested that you have a cardiac MRI. Cardiac MRI uses a computer to create images of your heart as its beating, producing both still and moving pictures of your heart and major blood vessels. For further information please visit [email or web address] Please follow the instruction sheet given to you today for more information. ? Insurance needs to be pre authorized. We will call you for appointment once approved ? ?Your physician recommends that you schedule a follow-up appointment in: 3 weeks with Dr. Aundra Dubin ? ?If you have any questions or concerns before your next appointment please send [place] a message through [place] or call our office at [phone].   ? ?TO LEAVE A MESSAGE FOR THE NURSE SELECT OPTION 2, PLEASE LEAVE A MESSAGE INCLUDING: ?YOUR NAME ?DATE OF BIRTH ?CALL BACK NUMBER ?REASON FOR CALL**this is important as we prioritize the call backs ? ?YOU WILL RECEIVE A CALL BACK THE SAME DAY AS LONG AS YOU CALL BEFORE 4:00 PM ? ?At the [facility], you and your health needs are our priority. As part of our continuing mission to provide you with exceptional heart care, we have created designated Provider Care Teams. These Care Teams include your primary Cardiologist (physician) and Advanced Practice Providers (APPs- Physician Assistants and Nurse Practitioners) who all work together to provide you with the care you need, when you need it.  ? ?You may see any of the following providers on your designated Care Team at your next follow up: ?Dr Glori Bickers ?Dr Loralie Champagne ?Darrick Grinder, NP ?Lyda Jester, PA ?Jessica Milford,NP ?Marlyce Huge, PA ?Audry Riles, PharmD ? ? ?Please be sure to bring in all your medications bottles to every appointment.  ? ? ?

## 2022-01-23 NOTE — Progress Notes (Signed)
?Heart and Vascular Care Navigation ? ?01/23/2022 ? ?Ronald Mcdowell ?[birth year] ?[id number] ? ?Reason for Referral: Patient seen in HF TOC ?  ?Engaged with patient face to face for initial visit for Heart and Vascular Care Coordination. ?                                                                                                  ?Assessment: Patient is a 76 yo married male who resides in a single family home. 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  Patient and wife deny any concerns with [employer] although asked for assistance with his short term disability application/process.  CSW discussed process and to obtain an application for disability through employer and will assist with completion.                              ? ?HRT/VAS Care Coordination   ? ? Patients Home Cardiology Office Heart Failure Clinic  HF TOC  ? Outpatient Care Team Social Worker  ? Social Worker Name: Raquel Sarna, [place] [phone]  ? Living arrangements for the past 2 months Single Family Home  ? Lives with: Spouse  ? Home Assistive Devices/Equipment None  ? ?  ? ? ?Social History:                                                                             ?SDOH Screenings  ? ?Alcohol Screen: Not on file  ?Depression (PHQ2-9): Low Risk   ? PHQ-2 Score: 0  ?Financial Resource Strain: Low Risk   ? Difficulty of Paying Living Expenses: Not very hard  ?Food Insecurity: No Food Insecurity  ? Worried About Charity fundraiser in the Last Year: Never true  ? Ran Out of Food in the Last Year: Never true  ?Housing: Low Risk   ? Last Housing Risk Score: 0  ?Physical Activity: Not on file  ?Social Connections: Not on file  ?Stress: Not on file  ?Tobacco Use: High Risk  ? Smoking Tobacco Use: Every Day  ? Smokeless Tobacco Use: Never  ? Passive Exposure: Not on file  ?Transportation Needs: No Transportation Needs  ? Lack of Transportation (Medical): No  ? Lack of Transportation (Non-Medical): No  ? ? ?SDOH  Interventions: ?Financial Resources:  Financial Strain Interventions: Intervention Not Indicated ?N/a  ?Food Insecurity:  Food Insecurity Interventions: Intervention Not Indicated  ?Housing Insecurity:  Housing Interventions: Intervention Not Indicated  ?Transportation:   Transportation Interventions: Intervention Not Indicated  ? ? ?Follow-up plan:  Patient and wife will obtain application for short term disability and return to CSW for further assistance. Patient appears to have good support and denies any other needs at this time. CSW available as needed. Raquel Sarna,  [place], [place] ? ? ? ? ? ?

## 2022-01-24 ENCOUNTER — Telehealth (HOSPITAL_COMMUNITY): Payer: Self-pay | Admitting: *Deleted

## 2022-01-28 ENCOUNTER — Telehealth: Payer: Self-pay | Admitting: Cardiology

## 2022-01-28 DIAGNOSIS — Z0279 Encounter for issue of other medical certificate: Secondary | ICD-10-CM

## 2022-01-28 NOTE — Telephone Encounter (Signed)
FMLA forms faxed to the office from [facility] ? ?Pt has signed medical release and [employer] form ? ?$29 pd w/ cash and is in safe till completion by provider. ? ?Paperwork given to clinical staff  ?

## 2022-01-30 DIAGNOSIS — I5022 Chronic systolic (congestive) heart failure: Secondary | ICD-10-CM | POA: Diagnosis not present

## 2022-02-04 ENCOUNTER — Encounter: Payer: Self-pay | Admitting: Cardiology

## 2022-02-04 NOTE — Telephone Encounter (Signed)
FMLA forms completed by Dr. Harl Bowie ?Faxed to [facility] [phone] ?Will forward information to [place] to have charged posted.  ?

## 2022-02-05 ENCOUNTER — Ambulatory Visit: Payer: Medicare HMO | Admitting: Cardiology

## 2022-02-12 ENCOUNTER — Other Ambulatory Visit: Payer: Self-pay | Admitting: *Deleted

## 2022-02-12 MED ORDER — APIXABAN 5 MG PO TABS
ORAL_TABLET | ORAL | 1 refills | Status: DC
Start: 2022-02-12 — End: 2022-09-26

## 2022-02-12 NOTE — Telephone Encounter (Signed)
Prescription refill request for Eliquis received. ?Indication: Atrial Fib ?Last office visit: 08/07/21  Zandra Abts MD ?Scr: 3.38 on 01/30/22 ?Age: 76 ?Weight: 65.5kg ? ?Based on above findings Eliquis '5mg'$  twice daily is the appropriate dose.  Refill approved. ? ?

## 2022-02-14 DIAGNOSIS — I252 Old myocardial infarction: Secondary | ICD-10-CM | POA: Diagnosis not present

## 2022-02-14 DIAGNOSIS — I420 Dilated cardiomyopathy: Secondary | ICD-10-CM | POA: Diagnosis not present

## 2022-02-15 ENCOUNTER — Inpatient Hospital Stay: Admission: AD | Admit: 2022-02-15 | Payer: Medicare HMO | Source: Other Acute Inpatient Hospital | Admitting: Cardiology

## 2022-02-15 DIAGNOSIS — R079 Chest pain, unspecified: Secondary | ICD-10-CM | POA: Diagnosis not present

## 2022-02-15 DIAGNOSIS — Z7982 Long term (current) use of aspirin: Secondary | ICD-10-CM | POA: Diagnosis not present

## 2022-02-15 DIAGNOSIS — R000 Tachycardia, unspecified: Secondary | ICD-10-CM | POA: Diagnosis not present

## 2022-02-15 DIAGNOSIS — Z20822 Contact with and (suspected) exposure to covid-19: Secondary | ICD-10-CM | POA: Diagnosis not present

## 2022-02-15 DIAGNOSIS — I110 Hypertensive heart disease with heart failure: Secondary | ICD-10-CM | POA: Diagnosis not present

## 2022-02-15 DIAGNOSIS — I2510 Atherosclerotic heart disease of native coronary artery without angina pectoris: Secondary | ICD-10-CM | POA: Diagnosis not present

## 2022-02-15 DIAGNOSIS — Z79899 Other long term (current) drug therapy: Secondary | ICD-10-CM | POA: Diagnosis not present

## 2022-02-15 DIAGNOSIS — E785 Hyperlipidemia, unspecified: Secondary | ICD-10-CM | POA: Diagnosis not present

## 2022-02-15 DIAGNOSIS — K219 Gastro-esophageal reflux disease without esophagitis: Secondary | ICD-10-CM | POA: Diagnosis not present

## 2022-02-15 DIAGNOSIS — I4891 Unspecified atrial fibrillation: Secondary | ICD-10-CM | POA: Diagnosis not present

## 2022-02-15 DIAGNOSIS — I509 Heart failure, unspecified: Secondary | ICD-10-CM | POA: Diagnosis not present

## 2022-02-15 DIAGNOSIS — I255 Ischemic cardiomyopathy: Secondary | ICD-10-CM | POA: Diagnosis not present

## 2022-02-16 ENCOUNTER — Inpatient Hospital Stay (HOSPITAL_COMMUNITY)
Admission: EM | Admit: 2022-02-16 | Discharge: 2022-02-20 | DRG: 291 | Disposition: A | Discharge status: Home / Self Care | Payer: Medicare HMO | Attending: Cardiology | Admitting: Cardiology

## 2022-02-16 ENCOUNTER — Encounter (HOSPITAL_COMMUNITY): Payer: Self-pay | Admitting: Emergency Medicine

## 2022-02-16 ENCOUNTER — Other Ambulatory Visit: Payer: Self-pay

## 2022-02-16 DIAGNOSIS — D696 Thrombocytopenia, unspecified: Secondary | ICD-10-CM | POA: Diagnosis not present

## 2022-02-16 DIAGNOSIS — Z7982 Long term (current) use of aspirin: Secondary | ICD-10-CM

## 2022-02-16 DIAGNOSIS — E876 Hypokalemia: Secondary | ICD-10-CM | POA: Diagnosis not present

## 2022-02-16 DIAGNOSIS — I340 Nonrheumatic mitral (valve) insufficiency: Secondary | ICD-10-CM | POA: Diagnosis not present

## 2022-02-16 DIAGNOSIS — Z91010 Allergy to peanuts: Secondary | ICD-10-CM

## 2022-02-16 DIAGNOSIS — K219 Gastro-esophageal reflux disease without esophagitis: Secondary | ICD-10-CM | POA: Diagnosis present

## 2022-02-16 DIAGNOSIS — Z87440 Personal history of urinary (tract) infections: Secondary | ICD-10-CM

## 2022-02-16 DIAGNOSIS — E7800 Pure hypercholesterolemia, unspecified: Secondary | ICD-10-CM | POA: Diagnosis present

## 2022-02-16 DIAGNOSIS — I255 Ischemic cardiomyopathy: Secondary | ICD-10-CM | POA: Diagnosis not present

## 2022-02-16 DIAGNOSIS — Z452 Encounter for adjustment and management of vascular access device: Secondary | ICD-10-CM | POA: Diagnosis not present

## 2022-02-16 DIAGNOSIS — N179 Acute kidney failure, unspecified: Secondary | ICD-10-CM | POA: Diagnosis not present

## 2022-02-16 DIAGNOSIS — I4891 Unspecified atrial fibrillation: Principal | ICD-10-CM | POA: Diagnosis present

## 2022-02-16 DIAGNOSIS — Z955 Presence of coronary angioplasty implant and graft: Secondary | ICD-10-CM | POA: Diagnosis not present

## 2022-02-16 DIAGNOSIS — E0500 Thyrotoxicosis with diffuse goiter without thyrotoxic crisis or storm: Secondary | ICD-10-CM | POA: Diagnosis present

## 2022-02-16 DIAGNOSIS — I428 Other cardiomyopathies: Secondary | ICD-10-CM | POA: Diagnosis not present

## 2022-02-16 DIAGNOSIS — I509 Heart failure, unspecified: Secondary | ICD-10-CM | POA: Diagnosis not present

## 2022-02-16 DIAGNOSIS — I252 Old myocardial infarction: Secondary | ICD-10-CM | POA: Diagnosis not present

## 2022-02-16 DIAGNOSIS — Z8249 Family history of ischemic heart disease and other diseases of the circulatory system: Secondary | ICD-10-CM | POA: Diagnosis not present

## 2022-02-16 DIAGNOSIS — I4819 Other persistent atrial fibrillation: Secondary | ICD-10-CM | POA: Diagnosis not present

## 2022-02-16 DIAGNOSIS — Z7901 Long term (current) use of anticoagulants: Secondary | ICD-10-CM | POA: Diagnosis not present

## 2022-02-16 DIAGNOSIS — I5042 Chronic combined systolic (congestive) and diastolic (congestive) heart failure: Secondary | ICD-10-CM | POA: Diagnosis not present

## 2022-02-16 DIAGNOSIS — R42 Dizziness and giddiness: Secondary | ICD-10-CM | POA: Diagnosis present

## 2022-02-16 DIAGNOSIS — Z87891 Personal history of nicotine dependence: Secondary | ICD-10-CM | POA: Diagnosis not present

## 2022-02-16 DIAGNOSIS — R000 Tachycardia, unspecified: Secondary | ICD-10-CM | POA: Diagnosis not present

## 2022-02-16 DIAGNOSIS — Z9079 Acquired absence of other genital organ(s): Secondary | ICD-10-CM

## 2022-02-16 DIAGNOSIS — N1831 Chronic kidney disease, stage 3a: Secondary | ICD-10-CM | POA: Diagnosis present

## 2022-02-16 DIAGNOSIS — I5043 Acute on chronic combined systolic (congestive) and diastolic (congestive) heart failure: Secondary | ICD-10-CM | POA: Diagnosis not present

## 2022-02-16 DIAGNOSIS — I110 Hypertensive heart disease with heart failure: Secondary | ICD-10-CM | POA: Diagnosis not present

## 2022-02-16 DIAGNOSIS — Z79899 Other long term (current) drug therapy: Secondary | ICD-10-CM

## 2022-02-16 DIAGNOSIS — I472 Ventricular tachycardia, unspecified: Secondary | ICD-10-CM | POA: Diagnosis not present

## 2022-02-16 DIAGNOSIS — I130 Hypertensive heart and chronic kidney disease with heart failure and stage 1 through stage 4 chronic kidney disease, or unspecified chronic kidney disease: Principal | ICD-10-CM | POA: Diagnosis present

## 2022-02-16 DIAGNOSIS — I5082 Biventricular heart failure: Secondary | ICD-10-CM | POA: Diagnosis present

## 2022-02-16 DIAGNOSIS — I2510 Atherosclerotic heart disease of native coronary artery without angina pectoris: Secondary | ICD-10-CM | POA: Diagnosis not present

## 2022-02-16 DIAGNOSIS — I5023 Acute on chronic systolic (congestive) heart failure: Secondary | ICD-10-CM | POA: Diagnosis not present

## 2022-02-16 DIAGNOSIS — E785 Hyperlipidemia, unspecified: Secondary | ICD-10-CM | POA: Diagnosis not present

## 2022-02-16 DIAGNOSIS — R079 Chest pain, unspecified: Secondary | ICD-10-CM | POA: Diagnosis not present

## 2022-02-16 DIAGNOSIS — R0989 Other specified symptoms and signs involving the circulatory and respiratory systems: Secondary | ICD-10-CM | POA: Diagnosis not present

## 2022-02-16 LAB — RENAL FUNCTION PANEL
Albumin: 3.8 g/dL (ref 3.5–5.0)
Anion gap: 12 (ref 5–15)
BUN: 56 mg/dL — ABNORMAL HIGH (ref 8–23)
CO2: 19 mmol/L — ABNORMAL LOW (ref 22–32)
Calcium: 6.5 mg/dL — ABNORMAL LOW (ref 8.9–10.3)
Chloride: 108 mmol/L (ref 98–111)
Creatinine, Ser: 2.72 mg/dL — ABNORMAL HIGH (ref 0.61–1.24)
GFR, Estimated: 24 mL/min — ABNORMAL LOW (ref >60)
Glucose, Bld: 92 mg/dL (ref 70–99)
Phosphorus: 3.8 mg/dL (ref 2.5–4.6)
Potassium: 3 mmol/L — ABNORMAL LOW (ref 3.5–5.1)
Sodium: 139 mmol/L (ref 135–145)

## 2022-02-16 LAB — CBC
HCT: 44.7 % (ref 39.0–52.0)
Hemoglobin: 15.1 g/dL (ref 13.0–17.0)
MCH: 31.5 pg (ref 26.0–34.0)
MCHC: 33.8 g/dL (ref 30.0–36.0)
MCV: 93.1 fL (ref 80.0–100.0)
Platelets: UNDETERMINED 10*3/uL (ref 150–400)
RBC: 4.8 MIL/uL (ref 4.22–5.81)
RDW: 14.6 % (ref 11.5–15.5)
WBC: 11.8 10*3/uL — ABNORMAL HIGH (ref 4.0–10.5)
nRBC: 0 % (ref 0.0–0.2)

## 2022-02-16 LAB — COMPREHENSIVE METABOLIC PANEL
ALT: 35 U/L (ref 0–44)
AST: 32 U/L (ref 15–41)
Albumin: 3.6 g/dL (ref 3.5–5.0)
Alkaline Phosphatase: 44 U/L (ref 38–126)
Anion gap: 13 (ref 5–15)
BUN: 60 mg/dL — ABNORMAL HIGH (ref 8–23)
CO2: 18 mmol/L — ABNORMAL LOW (ref 22–32)
Calcium: 6.7 mg/dL — ABNORMAL LOW (ref 8.9–10.3)
Chloride: 106 mmol/L (ref 98–111)
Creatinine, Ser: 2.83 mg/dL — ABNORMAL HIGH (ref 0.61–1.24)
GFR, Estimated: 23 mL/min — ABNORMAL LOW (ref >60)
Glucose, Bld: 95 mg/dL (ref 70–99)
Potassium: 2.7 mmol/L — CL (ref 3.5–5.1)
Sodium: 137 mmol/L (ref 135–145)
Total Bilirubin: 1.9 mg/dL — ABNORMAL HIGH (ref 0.3–1.2)
Total Protein: 7.3 g/dL (ref 6.5–8.1)

## 2022-02-16 LAB — BRAIN NATRIURETIC PEPTIDE: B Natriuretic Peptide: 758.1 pg/mL — ABNORMAL HIGH (ref 0.0–100.0)

## 2022-02-16 LAB — MAGNESIUM: Magnesium: 1.2 mg/dL — ABNORMAL LOW (ref 1.7–2.4)

## 2022-02-16 MED ORDER — NITROGLYCERIN 0.4 MG SL SUBL
0.4000 mg | SUBLINGUAL_TABLET | SUBLINGUAL | Status: DC | PRN
  Administered 2022-02-17: 0.4 mg via SUBLINGUAL
  Filled 2022-02-16: qty 1

## 2022-02-16 MED ORDER — POTASSIUM CHLORIDE CRYS ER 20 MEQ PO TBCR
40.0000 meq | EXTENDED_RELEASE_TABLET | Freq: Once | ORAL | Status: AC
  Administered 2022-02-16: 40 meq via ORAL
  Filled 2022-02-16: qty 2

## 2022-02-16 MED ORDER — VITAMIN D 25 MCG (1000 UNIT) PO TABS
1000.0000 [IU] | ORAL_TABLET | Freq: Every day | ORAL | Status: DC
Start: 2022-02-16 — End: 2022-02-20
  Administered 2022-02-16 – 2022-02-20 (×5): 1000 [IU] via ORAL
  Filled 2022-02-16 (×5): qty 1

## 2022-02-16 MED ORDER — METOPROLOL SUCCINATE ER 25 MG PO TB24
25.0000 mg | ORAL_TABLET | Freq: Every day | ORAL | Status: DC
  Administered 2022-02-16 – 2022-02-17 (×2): 25 mg via ORAL
  Filled 2022-02-16 (×2): qty 1

## 2022-02-16 MED ORDER — ACETAMINOPHEN 325 MG PO TABS: 650.0000 mg | ORAL_TABLET | ORAL | Status: DC | PRN

## 2022-02-16 MED ORDER — AMIODARONE HCL IN DEXTROSE 360-4.14 MG/200ML-% IV SOLN
30.0000 mg/h | INTRAVENOUS | Status: DC
Start: 2022-02-16 — End: 2022-02-19
  Administered 2022-02-16 – 2022-02-19 (×7): 30 mg/h via INTRAVENOUS
  Filled 2022-02-16 (×6): qty 200

## 2022-02-16 MED ORDER — PANTOPRAZOLE SODIUM 40 MG PO TBEC
40.0000 mg | DELAYED_RELEASE_TABLET | Freq: Every day | ORAL | Status: DC
  Administered 2022-02-16 – 2022-02-20 (×5): 40 mg via ORAL
  Filled 2022-02-16 (×5): qty 1

## 2022-02-16 MED ORDER — ASPIRIN EC 81 MG PO TBEC
81.0000 mg | DELAYED_RELEASE_TABLET | Freq: Every day | ORAL | Status: DC
  Administered 2022-02-16 – 2022-02-17 (×2): 81 mg via ORAL
  Filled 2022-02-16 (×2): qty 1

## 2022-02-16 MED ORDER — POTASSIUM CHLORIDE 10 MEQ/100ML IV SOLN
10.0000 meq | INTRAVENOUS | Status: AC
  Administered 2022-02-16 (×4): 10 meq via INTRAVENOUS
  Filled 2022-02-16 (×4): qty 100

## 2022-02-16 MED ORDER — MAGNESIUM SULFATE 2 GM/50ML IV SOLN
2.0000 g | Freq: Once | INTRAVENOUS | Status: AC
  Administered 2022-02-16: 2 g via INTRAVENOUS
  Filled 2022-02-16: qty 50

## 2022-02-16 MED ORDER — AMIODARONE HCL IN DEXTROSE 360-4.14 MG/200ML-% IV SOLN
60.0000 mg/h | INTRAVENOUS | Status: AC
  Administered 2022-02-16 (×2): 60 mg/h via INTRAVENOUS
  Filled 2022-02-16: qty 200

## 2022-02-16 MED ORDER — ONDANSETRON HCL 4 MG/2ML IJ SOLN
4.0000 mg | Freq: Four times a day (QID) | INTRAMUSCULAR | Status: DC | PRN
  Administered 2022-02-17: 4 mg via INTRAVENOUS
  Filled 2022-02-16: qty 2

## 2022-02-16 MED ORDER — ATORVASTATIN CALCIUM 80 MG PO TABS
80.0000 mg | ORAL_TABLET | Freq: Every day | ORAL | Status: DC
  Administered 2022-02-16 – 2022-02-20 (×5): 80 mg via ORAL
  Filled 2022-02-16 (×5): qty 1

## 2022-02-16 MED ORDER — APIXABAN 5 MG PO TABS
5.0000 mg | ORAL_TABLET | Freq: Two times a day (BID) | ORAL | Status: DC
  Administered 2022-02-16 – 2022-02-20 (×9): 5 mg via ORAL
  Filled 2022-02-16 (×9): qty 1

## 2022-02-16 NOTE — H&P (Addendum)
?Cardiology Admission History and Physical:  ? ?Patient ID: Ronald Mcdowell ?MRN: [id number]; DOB: September 24, 1946  ? ?Admission date: 02/16/2022 ? ?PCP:  Neale Burly, MD ?  ?[facility] Providers ?Cardiologist:  Carlyle Dolly, MD  ?Advanced Heart Failure:  Loralie Champagne, MD  { ? ? ?Chief Complaint:  Dizziness/palpitations/LifeVest noise ? ?Patient Profile:  ? ?Ronald Mcdowell is a 76 y.o. male with chronic biventricular heart failure with EF listed 20%, coronary artery disease, persistent atrial fibrillation, nonsustained VT, chronic kidney disease stage III, hypertension and prior tobacco smoking (quit February 2023) who is being seen 02/16/2022 for the evaluation of dizziness, palpitation and LifeVest noise. ? ?Hx of CAD w/ remote PCI to LCx in 2014 and medically managnaged RCA disease. ?Echo in 2016 showed mildly reduced LVEF, 45-50%.  ?Echo 09/2019 EF 50%, RV normal.  ? ?Recently admitted 2/23 w/ acute HF. He was in Afib, but V-rates noted to be decently controlled. Echo showed severely reduced LVEF,  LV severely dilated, EF <20% w/ global HK, RV mildy enlarged w/ mildly reduced systolic function. No LVH. Only mild MR and trivial TR. Had subsequent R/LHC which showed only mild-mod nonobstructive CAD w/ widely patent, previously placed LCx stent. RCA dominant w/  70% stenosis in mid portion and 65% dRCA stenosis. Systolic function out of proportion to degree of CAD. RHC showed low filling pressures, mRAP 1, mPAP 15, mPCWP 11 and Preserved cardiac output, Fick CO 4.38 L/min, CI 2.59 L/min. He was placed on GDMT but no loop diuretic. LifeVest was also placed for low EF and frequent NSVT. ? ?Seen in heart failure clinic January 23, 2022.  Pending cardiac MRI.  Unclear etiology of low EF.  Per note "? Viral CM, Had suspected PNA 3-4 weeks prior to HF admission. Also ? Familial given FH. Potential tachymediated CM in setting of persistent afib, though V-rates not markedly high". No SGLTi due to recurrent UTI. Has follow  up with Dr. Aundra Dubin 02/19/22. ? ?History of Present Illness:  ? ?Ronald Mcdowell reported few weeks history of intermittent dizziness lasting for few minutes to hours.  Not typically positional.  Yesterday he had a worse episode of dizziness with palpitation and extreme weakness while doing weed eating.  Few seconds later he had a noise from LifeVest.  He sat down but symptoms persisted and had 2 more episodes of noise from LifeVest.  Denied chest pain, shortness of breath or syncope.  He went to [facility] where noted in atrial fibrillation with rapid ventricular rate of 130s.  Placed on IV amiodarone. ? ?Chest x-ray without acute disease. ?BUN/creatinine 67/3.32 (baseline Scr 1.6) ?K 3.1 ?Hemoglobin 16.2 ?Magnesium 0.5 ?Pro BNP 4027 ?Hs-troponin 19 ? ?Patient was given total of 2 mg of IV magnesium at [facility] ER. ? ?Upon arrival to [facility] emergency room patient is hemodynamically stable.  No chest pain, shortness of breath, orthopnea, PND, syncope or lower extremity edema.  Reports compliance with medication.  Last dose of Eliquis a.m. of 3/24. ? ?Review of [employer] LifeVest showed 6 event detected but not treated.  This correlates with patient's initial symptoms around 11:00.  Seems underlying rhythm is atrial fibrillation with rapid ventricular rate. ? ? ?Past Medical History:  ?Diagnosis Date  ? Anemia   ? Atrial fibrillation ([place])   ? CHF (congestive heart failure) ([place])   ? Chronic kidney disease, unspecified 01/14/2020  ? Coronary artery disease   ? a. h/o MI in 2000 w/ prior LCX stenting;  b. 7.2014 Abnl Cardiolite,  EF 41%, mod-large inferolat scar;  c. 07/2013 Cath/PCI: LM nl, LAD 10-20, D1 40-50p, LCX 95-99 @ distal stent margin (2.5x20 Promus Premier DES), RCA dom, 40p, 35m 70d, EF 35-40%.  ? Elevated cholesterol   ? GERD (gastroesophageal reflux disease)   ? Graves disease 04/2020  ? s/p I-131 thyroid ablation   ? Hemorrhoids   ? Hypertension   ? Hyperthyroidism 09/27/2020  ? Ischemic cardiomyopathy   ? a. 07/2013  EF 35-40% by LV gram.; EF 50% on ECHO in November 2020  ? Myocardial infarction ([facility]   ? Reflux esophagitis   ? Spondylosis of cervical joint   ? C3-4, C6-7  ? Tobacco user   ? Vitamin D deficiency disease 08/24/2019  ? ? ?Past Surgical History:  ?Procedure Laterality Date  ? BIOPSY  01/09/2018  ? Procedure: BIOPSY;  Surgeon: RDaneil Dolin MD;  [facility];  Service: Endoscopy;;  esophageal biopsies  ? CARDIAC CATHETERIZATION    ? COLONOSCOPY    ? COLONOSCOPY N/A 01/25/2016  ? RMR: normal ileocolonoscopy ( anal canal hemorrhoids)  ? CORONARY ANGIOPLASTY WITH STENT PLACEMENT  08/23/2013  ? mid circumflex  DES    by Dr JMartinique ? CORONARY STENT PLACEMENT  2000  ? ESOPHAGOGASTRODUODENOSCOPY N/A 01/25/2016  ? RMR: Reflux esophagitits. Cervical web with passage of the scope. Status post esophageal biosy. Hiatal hernia.   ? ESOPHAGOGASTRODUODENOSCOPY N/A 01/09/2018  ? Rourk: cervical/proximal esophageal web s/p dilation, markedly abnormal esophagus c/w biopsy proven eosinophilic esophagitis. small hiatal hernia.  ? ESOPHAGOGASTRODUODENOSCOPY N/A 05/31/2020  ? Procedure: ESOPHAGOGASTRODUODENOSCOPY (EGD);  Surgeon: RDaneil Dolin MD;  Location: [facility];  Service: Endoscopy;  Laterality: N/A;  8:30am  ? LEFT HEART CATHETERIZATION WITH CORONARY ANGIOGRAM N/A 08/23/2013  ? Procedure: LEFT HEART CATHETERIZATION WITH CORONARY ANGIOGRAM;  Surgeon: Peter M JMartinique MD;  [facility];  Service: Cardiovascular;  Laterality: N/A;  ? MALONEY DILATION N/A 01/09/2018  ? Procedure: MALONEY DILATION;  Surgeon: RDaneil Dolin MD;  Location[facility];  Service: Endoscopy;  Laterality: N/A;  ? MALONEY DILATION N/A 05/31/2020  ? Procedure: MALONEY DILATION;  Surgeon: RDaneil Dolin MD;  [facility];  Service: Endoscopy;  Laterality: N/A;  ? RIGHT/LEFT HEART CATH AND CORONARY ANGIOGRAPHY N/A 01/16/2022  ? Procedure: RIGHT/LEFT HEART CATH AND CORONARY ANGIOGRAPHY;  Surgeon: SBelva Crome MD;  [facility];  Service: Cardiovascular;  Laterality: N/A;  ? TRANSURETHRAL RESECTION OF PROSTATE  01/09/2012  ? Procedure: TRANSURETHRAL RESECTION OF THE PROSTATE (TURP);  Surgeon: MMarissa Nestle MD;  Location: [facility] ORS;  Service: Urology;  Laterality: N/A;  ?  ? ?Medications Prior to Admission: ?Prior to Admission medications   ?Medication Sig Start Date End Date Taking? Authorizing Provider  ?apixaban (ELIQUIS) 5 MG TABS tablet TAKE 1 TABLET(5 MG) BY MOUTH TWICE DAILY ?Patient taking differently: Take 5 mg by mouth 2 (two) times daily. TAKE 1 TABLET(5 MG) BY MOUTH TWICE DAILY 02/12/22  Yes Branch, JAlphonse Guild MD  ?aspirin 81 MG EC tablet Take 81 mg by mouth daily. Swallow whole.   Yes [provider]  ?atorvastatin (LIPITOR) 80 MG tablet Take 1 tablet (80 mg total) by mouth daily. 10/09/21  Yes Branch,Alphonse Guild MD  ?Cholecalciferol (VITAMIN D-3) 125 MCG (5000 UT) TABS Take 1 tablet by mouth daily.   Yes [provider]  ?metoprolol succinate (TOPROL-XL) 25 MG 24 hr tablet Take 25 mg by mouth daily.   Yes [provider]  ?pantoprazole (PROTONIX) 40 MG tablet Take 1 tablet (40 mg total) by mouth daily. 10/09/21  Yes BranchAlphonse Guild, MD  ?sacubitril-valsartan (ENTRESTO) 24-26 MG Take 1 tablet by mouth 2 (two) times daily. 01/17/22  Yes Swayze, Ava, DO  ?spironolactone (ALDACTONE) 25 MG tablet Take 0.5 tablets (12.5 mg total) by mouth daily. 01/23/22  Yes Lyda Jester M, PA-C  ?torsemide (DEMADEX) 20 MG tablet Take 1 tablet (20 mg total) by mouth daily. 01/23/22  Yes Lyda Jester M, PA-C  ?baclofen (LIORESAL) 10 MG tablet Take 10 mg by mouth 3 (three) times daily as needed. ?Patient not taking: Reported on 02/16/2022 01/07/22   [provider]  ?nitroGLYCERIN (NITROSTAT) 0.4 MG SL tablet Place 1 tablet (0.4 mg total) under the tongue every 5 (five) minutes as needed for chest pain. 12/04/20   Doree Albee, MD  ?  ? ?Allergies:    ?Allergies  ?Allergen Reactions   ? Peanut Butter Flavor Swelling  ? ? ?Social History:   ?Social History  ? ?Socioeconomic History  ? Marital status: Married  ?  Spouse name: [name]  ? Number of children: Not on file  ? Years of e

## 2022-02-16 NOTE — ED Provider Notes (Signed)
?[facility] ?Provider Note ? ? ?CSN: [id number] ?Arrival date & time: 02/16/22  0544 ? ?  ? ?History ? ?Chief Complaint  ?Patient presents with  ? Chest Pain  ? Afib RVR transfer  ? ? ?Ronald Mcdowell is a 76 y.o. male. ? ?Received patient as a transfer for [facility]  Patient initially accepted by cardiology for admission, however, no beds were available.  Patient transferred to the ED pending bed assignment.  At arrival he reports that his LifeVest "went off" 3 times.  He reports that he has had it for a month and has never done that before.  He does not think he was shocked, reports that the lights went off and it beeps.  He has noticed shortness of breath and feeling like his heart is racing. ? ? ?  ? ?Home Medications ?Prior to Admission medications   ?Medication Sig Start Date End Date Taking? Authorizing Provider  ?apixaban (ELIQUIS) 5 MG TABS tablet TAKE 1 TABLET(5 MG) BY MOUTH TWICE DAILY 02/12/22   Arnoldo Lenis, MD  ?atorvastatin (LIPITOR) 80 MG tablet Take 1 tablet (80 mg total) by mouth daily. 10/09/21   Arnoldo Lenis, MD  ?baclofen (LIORESAL) 10 MG tablet Take 10 mg by mouth 3 (three) times daily as needed. 01/07/22   [provider]  ?Cholecalciferol (VITAMIN D-3) 125 MCG (5000 UT) TABS Take 1 tablet by mouth daily.    [provider]  ?metoprolol succinate (TOPROL-XL) 25 MG 24 hr tablet Take 25 mg by mouth daily.    [provider]  ?nitroGLYCERIN (NITROSTAT) 0.4 MG SL tablet Place 1 tablet (0.4 mg total) under the tongue every 5 (five) minutes as needed for chest pain. 12/04/20   Doree Albee, MD  ?pantoprazole (PROTONIX) 40 MG tablet Take 1 tablet (40 mg total) by mouth daily. 10/09/21   Arnoldo Lenis, MD  ?sacubitril-valsartan (ENTRESTO) 24-26 MG Take 1 tablet by mouth 2 (two) times daily. 01/17/22   Swayze, Ava, DO  ?spironolactone (ALDACTONE) 25 MG tablet Take 0.5 tablets (12.5 mg total) by mouth daily. 01/23/22    Lyda Jester M, PA-C  ?torsemide (DEMADEX) 20 MG tablet Take 1 tablet (20 mg total) by mouth daily. 01/23/22   Consuelo Pandy, PA-C  ?   ? ?Allergies    ?Peanut butter flavor   ? ?Review of Systems   ?Review of Systems  ?Respiratory:  Positive for shortness of breath.   ?Cardiovascular:  Positive for palpitations.  ? ?Physical Exam ?Updated Vital Signs ?BP (!) 124/93 (BP Location: Right Arm)   Pulse 78   Temp (!) 97.5 ?F (36.4 ?C) (Oral)   Resp 18   Ht '5\' 5"'$  (1.651 m)   Wt 62.1 kg   SpO2 100%   BMI 22.80 kg/m?  ?Physical Exam ?Vitals and nursing note reviewed.  ?Constitutional:   ?   General: He is not in acute distress. ?   Appearance: He is well-developed.  ?HENT:  ?   Head: Normocephalic and atraumatic.  ?   Mouth/Throat:  ?   Mouth: Mucous membranes are moist.  ?Eyes:  ?   General: Vision grossly intact. Gaze aligned appropriately.  ?   Extraocular Movements: Extraocular movements intact.  ?   Conjunctiva/sclera: Conjunctivae normal.  ?Cardiovascular:  ?   Rate and Rhythm: Normal rate. Rhythm irregular.  ?   Pulses: Normal pulses.  ?   Heart sounds: Normal heart sounds, S1 normal and S2 normal. No murmur  heard. ?  No friction rub. No gallop.  ?Pulmonary:  ?   Effort: Pulmonary effort is normal. No respiratory distress.  ?   Breath sounds: Normal breath sounds.  ?Abdominal:  ?   Palpations: Abdomen is soft.  ?   Tenderness: There is no abdominal tenderness. There is no guarding or rebound.  ?   Hernia: No hernia is present.  ?Musculoskeletal:     ?   General: No swelling.  ?   Cervical back: Full passive range of motion without pain, normal range of motion and neck supple. No pain with movement, spinous process tenderness or muscular tenderness. Normal range of motion.  ?   Right lower leg: No edema.  ?   Left lower leg: No edema.  ?Skin: ?   General: Skin is warm and dry.  ?   Capillary Refill: Capillary refill takes less than 2 seconds.  ?   Findings: No ecchymosis, erythema, lesion or wound.   ?Neurological:  ?   Mental Status: He is alert and oriented to person, place, and time.  ?   GCS: GCS eye subscore is 4. GCS verbal subscore is 5. GCS motor subscore is 6.  ?   Cranial Nerves: Cranial nerves 2-12 are intact.  ?   Sensory: Sensation is intact.  ?   Motor: Motor function is intact. No weakness or abnormal muscle tone.  ?   Coordination: Coordination is intact.  ?Psychiatric:     ?   Mood and Affect: Mood normal.     ?   Speech: Speech normal.     ?   Behavior: Behavior normal.  ? ? ?ED Results / Procedures / Treatments   ?Labs ?(all labs ordered are listed, but only abnormal results are displayed) ?Labs Reviewed - No data to display ? ?EKG ?None ? ?Radiology ?No results found. ? ?Procedures ?Procedures  ? ? ?Medications Ordered in ED ?Medications  ?amiodarone (NEXTERONE PREMIX) 360-4.14 MG/200ML-% (1.8 mg/mL) IV infusion (has no administration in time range)  ?amiodarone (NEXTERONE PREMIX) 360-4.14 MG/200ML-% (1.8 mg/mL) IV infusion (has no administration in time range)  ? ? ?ED Course/ Medical Decision Making/ A&P ?  ?                        ?Medical Decision Making ?Risk ?Prescription drug management. ? ? ?Patient with a LifeVest on.  He was transferred here for cardiology evaluation and admission.  There is some concern at [facility] that he might have been shocked by his LifeVest.  Verbal report from [facility], however, is that there was no activity.  Awaiting fax of the report. ? ?Patient is stable and without any significant complaints at arrival.  He arrives on an amiodarone drip.  He is rate controlled.  Report from [facility] reveals that he was in the 140s for them.  Patient is already anticoagulated on Eliquis.  Does not require additional anticoagulation.  Contacted cardiology, they will come and admit the patient. ? ? ? ? ? ? ? ?Final Clinical Impression(s) / ED Diagnoses ?Final diagnoses:  ?Atrial fibrillation with RVR ([place])  ? ? ?Rx / DC Orders ?ED Discharge Orders   ? ? None  ? ?   ? ? ?  ?Orpah Greek, MD ?02/16/22 [phone] ? ?

## 2022-02-16 NOTE — ED Triage Notes (Signed)
Pt bib [facility] from [facility] for chest pain that showed to be afib RVR. 150 bolus of amiodarone given at facility with resulting rate controlled. Per [facility], pt had intermittent 10-second durations of tachycardia up to 140, but otherwise remained rate controlled. Troponins 18-19 at [facility].  ? ?120/84 ?99% room air ? ?

## 2022-02-16 NOTE — ED Notes (Signed)
RN called [employer] support phone number and spoke with representative to obtain [employer] report for cardiology team to review. Per representative, plan to obtain faxed report within the next 10-15 minutes.  ?

## 2022-02-17 ENCOUNTER — Inpatient Hospital Stay: Payer: Self-pay

## 2022-02-17 ENCOUNTER — Inpatient Hospital Stay (HOSPITAL_COMMUNITY): Payer: Medicare HMO

## 2022-02-17 DIAGNOSIS — I5043 Acute on chronic combined systolic (congestive) and diastolic (congestive) heart failure: Secondary | ICD-10-CM

## 2022-02-17 LAB — BASIC METABOLIC PANEL
Anion gap: 8 (ref 5–15)
BUN: 56 mg/dL — ABNORMAL HIGH (ref 8–23)
CO2: 19 mmol/L — ABNORMAL LOW (ref 22–32)
Calcium: 6.3 mg/dL — CL (ref 8.9–10.3)
Chloride: 112 mmol/L — ABNORMAL HIGH (ref 98–111)
Creatinine, Ser: 2.58 mg/dL — ABNORMAL HIGH (ref 0.61–1.24)
GFR, Estimated: 25 mL/min — ABNORMAL LOW (ref >60)
Glucose, Bld: 120 mg/dL — ABNORMAL HIGH (ref 70–99)
Potassium: 4.1 mmol/L (ref 3.5–5.1)
Sodium: 139 mmol/L (ref 135–145)

## 2022-02-17 LAB — CBC
HCT: 37 % — ABNORMAL LOW (ref 39.0–52.0)
Hemoglobin: 12.8 g/dL — ABNORMAL LOW (ref 13.0–17.0)
MCH: 30.9 pg (ref 26.0–34.0)
MCHC: 34.6 g/dL (ref 30.0–36.0)
MCV: 89.4 fL (ref 80.0–100.0)
Platelets: 87 10*3/uL — ABNORMAL LOW (ref 150–400)
RBC: 4.14 MIL/uL — ABNORMAL LOW (ref 4.22–5.81)
RDW: 14.9 % (ref 11.5–15.5)
WBC: 10.7 10*3/uL — ABNORMAL HIGH (ref 4.0–10.5)
nRBC: 0 % (ref 0.0–0.2)

## 2022-02-17 LAB — LIPID PANEL
Cholesterol: 126 mg/dL (ref 0–200)
HDL: 26 mg/dL — ABNORMAL LOW (ref >40)
LDL Cholesterol: 71 mg/dL (ref 0–99)
Total CHOL/HDL Ratio: 4.8 RATIO
Triglycerides: 145 mg/dL (ref <150)
VLDL: 29 mg/dL (ref 0–40)

## 2022-02-17 LAB — PROTIME-INR
INR: 1.6 — ABNORMAL HIGH (ref 0.8–1.2)
Prothrombin Time: 18.8 seconds — ABNORMAL HIGH (ref 11.4–15.2)

## 2022-02-17 LAB — COOXEMETRY PANEL
Carboxyhemoglobin: 0.4 % — ABNORMAL LOW (ref 0.5–1.5)
Methemoglobin: <0.7 % (ref 0.0–1.5)
O2 Saturation: 50.9 %
Total hemoglobin: 11.6 g/dL — ABNORMAL LOW (ref 12.0–16.0)

## 2022-02-17 LAB — MAGNESIUM: Magnesium: 1.6 mg/dL — ABNORMAL LOW (ref 1.7–2.4)

## 2022-02-17 MED ORDER — FUROSEMIDE 10 MG/ML IJ SOLN
40.0000 mg | Freq: Once | INTRAMUSCULAR | Status: AC
  Administered 2022-02-17: 40 mg via INTRAVENOUS
  Filled 2022-02-17: qty 4

## 2022-02-17 MED ORDER — MILRINONE LACTATE IN DEXTROSE 20-5 MG/100ML-% IV SOLN
0.1250 ug/kg/min | INTRAVENOUS | Status: DC
  Administered 2022-02-18: 0.125 ug/kg/min via INTRAVENOUS
  Administered 2022-02-18: 0.25 ug/kg/min via INTRAVENOUS
  Filled 2022-02-17 (×2): qty 100

## 2022-02-17 MED ORDER — CALCIUM GLUCONATE-NACL 1-0.675 GM/50ML-% IV SOLN
1.0000 g | Freq: Once | INTRAVENOUS | Status: AC
  Administered 2022-02-17: 1000 mg via INTRAVENOUS
  Filled 2022-02-17: qty 50

## 2022-02-17 MED ORDER — SODIUM CHLORIDE 0.9% FLUSH
10.0000 mL | Freq: Two times a day (BID) | INTRAVENOUS | Status: DC
  Administered 2022-02-17 – 2022-02-19 (×3): 10 mL

## 2022-02-17 MED ORDER — CHLORHEXIDINE GLUCONATE CLOTH 2 % EX PADS
6.0000 | MEDICATED_PAD | Freq: Every day | CUTANEOUS | Status: DC
  Administered 2022-02-17 – 2022-02-19 (×3): 6 via TOPICAL

## 2022-02-17 MED ORDER — SODIUM CHLORIDE 0.9% FLUSH
10.0000 mL | INTRAVENOUS | Status: DC | PRN
  Administered 2022-02-19: 10 mL

## 2022-02-17 MED ORDER — MILRINONE LACTATE IN DEXTROSE 20-5 MG/100ML-% IV SOLN: 0.2500 ug/kg/min | INTRAVENOUS | Status: DC

## 2022-02-17 MED ORDER — SODIUM CHLORIDE 0.9 % IV SOLN: INTRAVENOUS | Status: DC

## 2022-02-17 MED ORDER — MAGNESIUM SULFATE 2 GM/50ML IV SOLN
2.0000 g | Freq: Once | INTRAVENOUS | Status: AC
  Administered 2022-02-17: 2 g via INTRAVENOUS
  Filled 2022-02-17: qty 50

## 2022-02-17 NOTE — Progress Notes (Signed)
Peripherally Inserted Central Catheter Placement ? ?The IV Nurse has discussed with the patient and/or persons authorized to consent for the patient, the purpose of this procedure and the potential benefits and risks involved with this procedure.  The benefits include less needle sticks, lab draws from the catheter, and the patient may be discharged home with the catheter. Risks include, but not limited to, infection, bleeding, blood clot (thrombus formation), and puncture of an artery; nerve damage and irregular heartbeat and possibility to perform a PICC exchange if needed/ordered by physician.  Alternatives to this procedure were also discussed.  Bard Power PICC patient education guide, fact sheet on infection prevention and patient information card has been provided to patient /or left at bedside. Pt signed consent with wife at bedside. ? ?PICC Placement Documentation  ?PICC Double Lumen 22/44/97 Right Basilic 38 cm 1 cm (Active)  ?Indication for Insertion or Continuance of Line Chronic illness with exacerbations (CF, Sickle Cell, etc.) 02/17/22 1545  ?Exposed Catheter (cm) 1 cm 02/17/22 1545  ?Site Assessment Clean, Dry, Intact 02/17/22 1545  ?Lumen #1 Status Flushed;Saline locked;Blood return noted 02/17/22 1545  ?Lumen #2 Status Flushed;Saline locked;Blood return noted 02/17/22 1545  ?Dressing Type Securing device;Transparent 02/17/22 1545  ?Dressing Status Antimicrobial disc in place 02/17/22 1545  ?Safety Lock Not Applicable 53/00/51 1021  ?Line Care Connections checked and tightened 02/17/22 1545  ?Line Adjustment (NICU/IV Team Only) No 02/17/22 1545  ?Dressing Intervention New dressing 02/17/22 1545  ?Dressing Change Due 02/24/22 02/17/22 1545  ? ? ? ? ? ?Mickel Baas  Monifa Blanchette ?02/17/2022, 3:52 PM ? ?

## 2022-02-17 NOTE — Progress Notes (Signed)
Patient complained of nausea, dizziness and heaviness on his chest. Pt denied having any chest pain. Patient stated that he feels short of breath as well. Placed oxygen 2L thru nasal cannula, performed EKG and administered prn Odnasteron. Pt was also given one dose of SL Nitroglycerin. Patient expressed that the heaviness has gone down and nausea has dissipated. Nurse will continue to monitor. ?

## 2022-02-17 NOTE — Consult Note (Signed)
?  ?Advanced Heart Failure Team Consult Note ? ? ?Primary Physician: Neale Burly, MD ?PCP-Cardiologist:  Carlyle Dolly, MD ? ?Reason for Consultation: CHF, atrial fibrillation ? ?HPI:   ? ?Ronald Mcdowell is seen today for evaluation of CHF, atrial fibrillation at the request of Dr. Gardiner Rhyme.  ? ?76 y.o. with history of CAD, CKD stage 3, persistent atrial fibrillation, and probably primarily nonischemic cardiomyopathy.  He had PCI to LCx in 2000 and again in 9/14. Echo in 2020 showed EF 50% with normal RV.   ? ?He has been in atrial fibrillation since at least 9/22.  ? ?Recently admitted 2/23 w/ acute HF. He was in atrial fibrillation, but V-rates noted to be decently controlled and was not cardioverted. Echo showed severely reduced LVEF,  LV severely dilated, EF <20% w/ global HK, RV mildy enlarged w/ mildly reduced systolic function. No LVH. Only mild MR and trivial TR. Had subsequent R/LHC which showed only mod nonobstructive CAD w/ widely patent, previously placed LCx stents. RCA dominant w/  70% stenosis in mid portion and 65% dRCA stenosis. Systolic dysfunction out of proportion to degree of CAD. RHC showed low filling pressures, mRAP 1, mPAP 15, mPCWP 11 and preserved cardiac output, Fick CO 4.38 L/min, CI 2.59 L/min. He was placed on GDMT but no loop diuretic. LifeVest was also placed for low EF and frequent NSVT. ?  ?Seen in heart failure clinic January 23, 2022.  Pending cardiac MRI.  Unclear etiology of low EF.  Posible viral cardiomyopathy as he had suspected PNA 3-4 weeks prior to HF admission in 2/23. Also ?familial given FH. Potential tachy-mediated CMP in setting of persistent afib, though V-rates not markedly high when seen in clinic. No SGLTi due to recurrent UTI.  ? ?Mr. Cattell reported few weeks history of intermittent dizziness lasting for few minutes to hours.  Not typically positional.  The day prior to admission, he had a worse episode of dizziness with palpitations and extreme weakness  while doing weed-eating.  Few seconds later he had a noise (beeping) from LifeVest.  He sat down but symptoms persisted and had 2 more episodes of noise from LifeVest. Denied chest pain, shortness of breath or syncope.  He [facility] to [facility] where he was in atrial fibrillation with rapid ventricular rate of 130s.  Placed on IV amiodarone, transferred here.  Also of note, creatinine was up to 3.32.   ? ?Entresto, spironolactone, and torsemide have been held.  He is now on amiodarone gtt with HR in 70s.  He missed at least 1 day of apixaban prior to admission (did not take any meds last Thursday because he "felt bad."  He woke up last night with PND, had to site on the side of the bed.  He reports ongoing exertional dyspnea. Creatinine trending down, 2.58 today.  ?  ?Review of Systems: All systems reviewed and negative except as per HPI.  ? ?Home Medications ?Prior to Admission medications   ?Medication Sig Start Date End Date Taking? Authorizing Provider  ?apixaban (ELIQUIS) 5 MG TABS tablet TAKE 1 TABLET(5 MG) BY MOUTH TWICE DAILY ?Patient taking differently: Take 5 mg by mouth 2 (two) times daily. TAKE 1 TABLET(5 MG) BY MOUTH TWICE DAILY 02/12/22  Yes Branch, Alphonse Guild, MD  ?aspirin 81 MG EC tablet Take 81 mg by mouth daily. Swallow whole.   Yes [provider]  ?atorvastatin (LIPITOR) 80 MG tablet Take 1 tablet (80 mg total) by mouth daily. 10/09/21  Yes Branch,  Alphonse Guild, MD  ?Cholecalciferol (VITAMIN D-3) 125 MCG (5000 UT) TABS Take 1 tablet by mouth daily.   Yes [provider]  ?metoprolol succinate (TOPROL-XL) 25 MG 24 hr tablet Take 25 mg by mouth daily.   Yes [provider]  ?pantoprazole (PROTONIX) 40 MG tablet Take 1 tablet (40 mg total) by mouth daily. 10/09/21  Yes BranchAlphonse Guild, MD  ?sacubitril-valsartan (ENTRESTO) 24-26 MG Take 1 tablet by mouth 2 (two) times daily. 01/17/22  Yes Swayze, Ava, DO  ?spironolactone (ALDACTONE) 25 MG tablet Take 0.5 tablets (12.5 mg  total) by mouth daily. 01/23/22  Yes Lyda Jester M, PA-C  ?torsemide (DEMADEX) 20 MG tablet Take 1 tablet (20 mg total) by mouth daily. 01/23/22  Yes Lyda Jester M, PA-C  ?baclofen (LIORESAL) 10 MG tablet Take 10 mg by mouth 3 (three) times daily as needed. ?Patient not taking: Reported on 02/16/2022 01/07/22   [provider]  ?nitroGLYCERIN (NITROSTAT) 0.4 MG SL tablet Place 1 tablet (0.4 mg total) under the tongue every 5 (five) minutes as needed for chest pain. 12/04/20   Doree Albee, MD  ? ? ?Past Medical History: ?1. CAD: PCI to LCx 2000, PCI LCx 9/14.   ?- LHC (2/23): 70% mRCA, 40% mLAD.  No intervention.  ?2. Cardiomyopathy: Suspect predominantly nonischemic.  Echo in 2020 with EF 50%.  Echo in 2/23 with EF < 20%, moderate-severe LV dilation, mild RVE with mildly decreased RV systolic function.   ?- RHC (2/23): mean RA 1, PA 24/10, mean PCWP 11, CI 2.59 ?3. CKD stage 3 ?4. H/o UTIs ?5. Atrial fibrillation: Persistent, noted in 9/22.  ?6. H/o Graves disease with hyperthyroidism s/p iodine thyroid ablation.  ?7. HTN.  ?8. Prior smoker ?9. NSVT ?10. Thrombocytopenia: Mild, chronic.  ? ?Past Surgical History: ?Past Surgical History:  ?Procedure Laterality Date  ? BIOPSY  01/09/2018  ? Procedure: BIOPSY;  Surgeon: Daneil Dolin, MD;  Location: [facility];  Service: Endoscopy;;  esophageal biopsies  ? CARDIAC CATHETERIZATION    ? COLONOSCOPY    ? COLONOSCOPY N/A 01/25/2016  ? RMR: normal ileocolonoscopy ( anal canal hemorrhoids)  ? CORONARY ANGIOPLASTY WITH STENT PLACEMENT  08/23/2013  ? mid circumflex  DES    by Dr Martinique  ? CORONARY STENT PLACEMENT  2000  ? ESOPHAGOGASTRODUODENOSCOPY N/A 01/25/2016  ? RMR: Reflux esophagitits. Cervical web with passage of the scope. Status post esophageal biosy. Hiatal hernia.   ? ESOPHAGOGASTRODUODENOSCOPY N/A 01/09/2018  ? Rourk: cervical/proximal esophageal web s/p dilation, markedly abnormal esophagus c/w biopsy proven eosinophilic esophagitis. small  hiatal hernia.  ? ESOPHAGOGASTRODUODENOSCOPY N/A 05/31/2020  ? Procedure: ESOPHAGOGASTRODUODENOSCOPY (EGD);  Surgeon: Daneil Dolin, MD;  Location: [facility];  Service: Endoscopy;  Laterality: N/A;  8:30am  ? LEFT HEART CATHETERIZATION WITH CORONARY ANGIOGRAM N/A 08/23/2013  ? Procedure: LEFT HEART CATHETERIZATION WITH CORONARY ANGIOGRAM;  Surgeon: Peter M Martinique, MD;  Location: [facility] CATH LAB;  Service: Cardiovascular;  Laterality: N/A;  ? MALONEY DILATION N/A 01/09/2018  ? Procedure: MALONEY DILATION;  Surgeon: Daneil Dolin, MD;  Location: [facility];  Service: Endoscopy;  Laterality: N/A;  ? MALONEY DILATION N/A 05/31/2020  ? Procedure: MALONEY DILATION;  Surgeon: Daneil Dolin, MD;  Location: [facility];  Service: Endoscopy;  Laterality: N/A;  ? RIGHT/LEFT HEART CATH AND CORONARY ANGIOGRAPHY N/A 01/16/2022  ? Procedure: RIGHT/LEFT HEART CATH AND CORONARY ANGIOGRAPHY;  Surgeon: Belva Crome, MD;  Location: [place] CV LAB;  Service: Cardiovascular;  Laterality: N/A;  ? TRANSURETHRAL RESECTION OF PROSTATE  01/09/2012  ? Procedure: TRANSURETHRAL RESECTION OF THE PROSTATE (TURP);  Surgeon: Marissa Nestle, MD;  Location: [facility] OR[facility];  Service: Urology;  Laterality: N/A;  ? ? ?Family History: ?Family History  ?Problem Relation Age of Onset  ? Early death Mother   ?     childbirth  ? Heart attack Father   ? Cancer Father   ? Heart disease Brother   ?     CABG  ? Heart disease Brother   ?     slight heart attack  ? Anesthesia problems Neg Hx   ? Hypotension Neg Hx   ? Malignant hyperthermia Neg Hx   ? Pseudochol deficiency Neg Hx   ? Colon cancer Neg Hx   ? Liver disease Neg Hx   ? Gastric cancer Neg Hx   ? Esophageal cancer Neg Hx   ? ? ?Social History: ?Social History  ? ?Socioeconomic History  ? Marital status: Married  ?  Spouse name: [name]  ? Number of children: Not on file  ? Years of education: 11th grade  ? Highest education level: 11th grade  ?Occupational History  ? Occupation: Librarian, academic  ?Tobacco  Use  ? Smoking status: Every Day  ?  Packs/day: 1.00  ?  Years: 53.00  ?  Pack years: 53.00  ?  Types: Cigarettes  ?  Start date: 12/09/1965  ? Smokeless tobacco: Never  ? Tobacco comments:  ?  3/4 pack of cigar

## 2022-02-18 ENCOUNTER — Inpatient Hospital Stay (HOSPITAL_COMMUNITY): Payer: Medicare HMO

## 2022-02-18 ENCOUNTER — Encounter (HOSPITAL_COMMUNITY): Payer: Self-pay | Admitting: Cardiology

## 2022-02-18 ENCOUNTER — Inpatient Hospital Stay (HOSPITAL_COMMUNITY): Payer: Medicare HMO | Admitting: Certified Registered Nurse Anesthetist

## 2022-02-18 ENCOUNTER — Encounter (HOSPITAL_COMMUNITY)
Admission: EM | Disposition: A | Discharge status: Home / Self Care | Payer: Self-pay | Source: Home / Self Care | Attending: Cardiology

## 2022-02-18 DIAGNOSIS — I340 Nonrheumatic mitral (valve) insufficiency: Secondary | ICD-10-CM

## 2022-02-18 DIAGNOSIS — I4891 Unspecified atrial fibrillation: Secondary | ICD-10-CM

## 2022-02-18 DIAGNOSIS — I110 Hypertensive heart disease with heart failure: Secondary | ICD-10-CM

## 2022-02-18 DIAGNOSIS — I5023 Acute on chronic systolic (congestive) heart failure: Secondary | ICD-10-CM

## 2022-02-18 DIAGNOSIS — I2510 Atherosclerotic heart disease of native coronary artery without angina pectoris: Secondary | ICD-10-CM

## 2022-02-18 HISTORY — PX: CARDIOVERSION: SHX1299

## 2022-02-18 HISTORY — PX: TEE WITHOUT CARDIOVERSION: SHX5443

## 2022-02-18 LAB — BASIC METABOLIC PANEL
Anion gap: 8 (ref 5–15)
BUN: 44 mg/dL — ABNORMAL HIGH (ref 8–23)
CO2: 20 mmol/L — ABNORMAL LOW (ref 22–32)
Calcium: 6.4 mg/dL — CL (ref 8.9–10.3)
Chloride: 113 mmol/L — ABNORMAL HIGH (ref 98–111)
Creatinine, Ser: 2.3 mg/dL — ABNORMAL HIGH (ref 0.61–1.24)
GFR, Estimated: 29 mL/min — ABNORMAL LOW (ref >60)
Glucose, Bld: 106 mg/dL — ABNORMAL HIGH (ref 70–99)
Potassium: 3.2 mmol/L — ABNORMAL LOW (ref 3.5–5.1)
Sodium: 141 mmol/L (ref 135–145)

## 2022-02-18 LAB — PHOSPHORUS: Phosphorus: 2.9 mg/dL (ref 2.5–4.6)

## 2022-02-18 LAB — CBC
HCT: 39.7 % (ref 39.0–52.0)
Hemoglobin: 13.3 g/dL (ref 13.0–17.0)
MCH: 30.4 pg (ref 26.0–34.0)
MCHC: 33.5 g/dL (ref 30.0–36.0)
MCV: 90.6 fL (ref 80.0–100.0)
Platelets: 93 10*3/uL — ABNORMAL LOW (ref 150–400)
RBC: 4.38 MIL/uL (ref 4.22–5.81)
RDW: 14.7 % (ref 11.5–15.5)
WBC: 9.7 10*3/uL (ref 4.0–10.5)
nRBC: 0 % (ref 0.0–0.2)

## 2022-02-18 LAB — COOXEMETRY PANEL
Carboxyhemoglobin: 1.1 % (ref 0.5–1.5)
Methemoglobin: 0.7 % (ref 0.0–1.5)
O2 Saturation: 68.8 %
Total hemoglobin: 12.9 g/dL (ref 12.0–16.0)

## 2022-02-18 LAB — MAGNESIUM: Magnesium: 1.7 mg/dL (ref 1.7–2.4)

## 2022-02-18 LAB — VITAMIN D 25 HYDROXY (VIT D DEFICIENCY, FRACTURES): Vit D, 25-Hydroxy: 57.6 ng/mL (ref 30–100)

## 2022-02-18 SURGERY — ECHOCARDIOGRAM, TRANSESOPHAGEAL
Anesthesia: General

## 2022-02-18 MED ORDER — PROPOFOL 500 MG/50ML IV EMUL
INTRAVENOUS | Status: DC | PRN
  Administered 2022-02-18: 75 ug/kg/min via INTRAVENOUS

## 2022-02-18 MED ORDER — POTASSIUM CHLORIDE CRYS ER 20 MEQ PO TBCR
40.0000 meq | EXTENDED_RELEASE_TABLET | Freq: Once | ORAL | Status: AC
  Administered 2022-02-18: 40 meq via ORAL
  Filled 2022-02-18: qty 2

## 2022-02-18 MED ORDER — MAGNESIUM SULFATE 4 GM/100ML IV SOLN
4.0000 g | Freq: Once | INTRAVENOUS | Status: AC
  Administered 2022-02-18: 4 g via INTRAVENOUS
  Filled 2022-02-18: qty 100

## 2022-02-18 MED ORDER — POTASSIUM CHLORIDE CRYS ER 20 MEQ PO TBCR
40.0000 meq | EXTENDED_RELEASE_TABLET | Freq: Once | ORAL | Status: AC
  Administered 2022-02-18: 40 meq via ORAL

## 2022-02-18 MED ORDER — POTASSIUM CHLORIDE CRYS ER 20 MEQ PO TBCR
40.0000 meq | EXTENDED_RELEASE_TABLET | Freq: Once | ORAL | Status: DC
  Filled 2022-02-18: qty 2

## 2022-02-18 MED ORDER — LIDOCAINE 2% (20 MG/ML) 5 ML SYRINGE
INTRAMUSCULAR | Status: DC | PRN
Start: 2022-02-18 — End: 2022-02-18
  Administered 2022-02-18: 60 mg via INTRAVENOUS

## 2022-02-18 MED ORDER — CALCIUM GLUCONATE-NACL 2-0.675 GM/100ML-% IV SOLN
2.0000 g | Freq: Once | INTRAVENOUS | Status: AC
  Administered 2022-02-18: 2000 mg via INTRAVENOUS
  Filled 2022-02-18: qty 100

## 2022-02-18 MED ORDER — MIDODRINE HCL 5 MG PO TABS
5.0000 mg | ORAL_TABLET | Freq: Three times a day (TID) | ORAL | Status: DC
  Filled 2022-02-18: qty 1

## 2022-02-18 MED ORDER — LACTATED RINGERS IV SOLN: INTRAVENOUS | Status: DC | PRN

## 2022-02-18 MED ORDER — PROPOFOL 10 MG/ML IV BOLUS
INTRAVENOUS | Status: DC | PRN
Start: 2022-02-18 — End: 2022-02-18
  Administered 2022-02-18 (×2): 20 mg via INTRAVENOUS

## 2022-02-18 NOTE — Interval H&P Note (Signed)
History and Physical Interval Note: ? ?02/18/2022 ?1:05 PM ? ?Ronald Mcdowell  has presented today for surgery, with the diagnosis of afib.  The various methods of treatment have been discussed with the patient and family. After consideration of risks, benefits and other options for treatment, the patient has consented to  Procedure(s): ?TRANSESOPHAGEAL ECHOCARDIOGRAM (TEE) (N/A) ?CARDIOVERSION (N/A) as a surgical intervention.  The patient's history has been reviewed, patient examined, no change in status, stable for surgery.  I have reviewed the patient's chart and labs.  Questions were answered to the patient's satisfaction.   ? ? ?[name] Aundra Dubin ? ? ?

## 2022-02-18 NOTE — Anesthesia Preprocedure Evaluation (Signed)
Anesthesia Evaluation  ?Patient identified by MRN, date of birth, ID band ?Patient awake ? ? ? ?Reviewed: ?Allergy & Precautions, H&P , NPO status , Patient's Chart, lab work & pertinent test results ? ?Airway ?Mallampati: II ? ? ?Neck ROM: full ? ? ? Dental ?  ?Pulmonary ?Current Smoker and Patient abstained from smoking.,  ?  ?breath sounds clear to auscultation ? ? ? ? ? ? Cardiovascular ?hypertension, + CAD, + Past MI, + Cardiac Stents and +CHF  ?+ dysrhythmias Atrial Fibrillation  ?Rhythm:irregular Rate:Normal ? ? ?  ?Neuro/Psych ?  ? GI/Hepatic ?hiatal hernia, GERD  ,  ?Endo/Other  ? ? Renal/GU ?Renal InsufficiencyRenal disease  ? ?  ?Musculoskeletal ? ?(+) Arthritis ,  ? Abdominal ?  ?Peds ? Hematology ?  ?Anesthesia Other Findings ? ? Reproductive/Obstetrics ? ?  ? ? ? ? ? ? ? ? ? ? ? ? ? ?  ?  ? ? ? ? ? ? ? ? ?Anesthesia Physical ?Anesthesia Plan ? ?ASA: 3 ? ?Anesthesia Plan: MAC  ? ?Post-op Pain Management:   ? ?Induction: Intravenous ? ?PONV Risk Score and Plan: 1 and Propofol infusion and Treatment may vary due to age or medical condition ? ?Airway Management Planned: Nasal Cannula ? ?Additional Equipment:  ? ?Intra-op Plan:  ? ?Post-operative Plan:  ? ?Informed Consent: I have reviewed the patients History and Physical, chart, labs and discussed the procedure including the risks, benefits and alternatives for the proposed anesthesia with the patient or authorized representative who has indicated his/her understanding and acceptance.  ? ? ? ?Dental advisory given ? ?Plan Discussed with: CRNA, Anesthesiologist and Surgeon ? ?Anesthesia Plan Comments:   ? ? ? ? ? ? ?Anesthesia Quick Evaluation ? ?

## 2022-02-18 NOTE — Care Management Important Message (Signed)
Important Message ? ?Patient Details  ?Name: Ronald Mcdowell ?MRN: [id number] ?Date of Birth: 12-23-1945 ? ? ?Medicare Important Message Given:  Yes ? ? ? ? ?Levada Dy  Lamarco Gudiel-Martin ?02/18/2022, 3:21 PM ?

## 2022-02-18 NOTE — CV Procedure (Signed)
Procedure: TEE ? ?Indication: Atrial fibrillation ? ?Sedation: Per anesthesiology ? ?Findings: Please see echo section for full report.  Moderately dilated LV with severe systolic dysfunction, EF 10%.  No LV thrombus.  Diffuse hypokinesis.  Normal RV size with mildly decreased systolic function.  Moderate left atrial enlargement, no LA appendage thrombus.  Mild right atrial enlargement.  No ASD or PFO by color doppler.  Mild mitral regurgitation.  Trileaflet aortic valve with trivial aortic insufficiency and no AS.  Trivial TR and PI.  Normal caliber descending thoracic aorta with minimal plaque.  ? ?May procedure with DCCV.  ? ?Ronald Mcdowell ?02/18/2022 ?1:21 PM ? ?

## 2022-02-18 NOTE — Transfer of Care (Signed)
Immediate Anesthesia Transfer of Care Note ? ?Patient: Ronald Mcdowell ? ?Procedure(s) Performed: TRANSESOPHAGEAL ECHOCARDIOGRAM (TEE) ?CARDIOVERSION ? ?Patient Location: Endoscopy Unit ? ?Anesthesia Type:General ? ?Level of Consciousness: awake, patient cooperative and responds to stimulation ? ?Airway & Oxygen Therapy: Patient Spontanous Breathing and Patient connected to nasal cannula oxygen ? ?Post-op Assessment: Report given to RN and Post -op Vital signs reviewed and stable ? ?Post vital signs: Reviewed and stable ? ?Last Vitals:  ?Vitals Value Taken Time  ?BP 107/52 02/18/22 1327  ?Temp 36.5 ?C 02/18/22 1327  ?Pulse 47 02/18/22 1329  ?Resp 14 02/18/22 1329  ?SpO2 99 % 02/18/22 1329  ?Vitals shown include unvalidated device data. ? ?Last Pain:  ?Vitals:  ? 02/18/22 1327  ?TempSrc: Temporal  ?PainSc: 0-No pain  ?   ? ?Patients Stated Pain Goal: 0 (02/17/22 2105) ? ?Complications: No notable events documented. ?

## 2022-02-18 NOTE — Progress Notes (Signed)
?  Transition of Care (TOC) Screening Note ? ? ?Patient Details  ?Name: Ronald Mcdowell ?Date of Birth: 1945/11/28 ? ? ?Transition of Care (TOC) CM/SW Contact:    ?Tasheem Elms, LCSW ?Phone Number: ?02/18/2022, 3:13 PM ? ? ? ?Transition of Care Department [facility]) has reviewed patient and no TOC needs have been identified at this time. We will continue to monitor patient advancement through interdisciplinary progression rounds. Patient will benefit from PT/OT consult for disposition recommendations. If new patient transition needs arise, please place a TOC consult. ?  ?

## 2022-02-18 NOTE — Progress Notes (Addendum)
Contacted regarding episodes of dizziness while lying in bed. Patient reports dizziness intermittently even prior to admission. ? ?BP soft 83F systolic. Diuretics stopped d/t low volume (CVP 3-4) and GDMT held d/t AKI.  ? ?Runs of NSVT on tele, nothing sustained. Replacing electrolytes. ? ?Discussed with RN. Check orthostatics. May need to add midodrine. ? ?Check CBC. ? ?ADDENDUM 1:56 pm: ? + orthostatics today. Possibly contributing to dizziness. ? ?Start midodrine 5 mg TID ? ?Hgb stable. ?

## 2022-02-18 NOTE — Progress Notes (Signed)
Was made aware patient has critically low calcium. Calcium 6.4, corrected 6.7.  ? ?Discussed with PharmD. Will give calcium gluconate. Check ionized calcium this afternoon. ?
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
## 2022-02-18 NOTE — Procedures (Addendum)
Electrical Cardioversion Procedure Note ?Ronald Mcdowell ?[id number] ?January 31, 1946 ? ?Procedure: Electrical Cardioversion ?Indications:  Atrial Fibrillation ? ?Procedure Details ?Consent: Risks of procedure as well as the alternatives and risks of each were explained to the (patient/caregiver).  Consent for procedure obtained. ?Time Out: Verified patient identification, verified procedure, site/side was marked, verified correct patient position, special equipment/implants available, medications/allergies/relevent history reviewed, required imaging and test results available.  Performed ? ?Patient placed on cardiac monitor, pulse oximetry, supplemental oxygen as necessary.  ?Sedation given:  Per anesthesiology ?Pacer pads placed anterior and posterior chest. ? ?Cardioverted 1 time(s).  ?Cardioverted at 200J. ? ?Evaluation ?Findings: Post procedure EKG shows: NSR ?Complications: None ?Patient did tolerate procedure well. ? ? ?Loralie Champagne ?02/18/2022, 1:21 PM ? ? ? ?

## 2022-02-18 NOTE — Progress Notes (Signed)
?  Echocardiogram ?Echocardiogram Transesophageal has been performed. ? ?Fidel Levy ?02/18/2022, 1:35 PM ?

## 2022-02-19 ENCOUNTER — Encounter (HOSPITAL_COMMUNITY): Payer: Self-pay | Admitting: Cardiology

## 2022-02-19 ENCOUNTER — Encounter (HOSPITAL_COMMUNITY): Payer: Medicare HMO | Admitting: Cardiology

## 2022-02-19 ENCOUNTER — Inpatient Hospital Stay (HOSPITAL_COMMUNITY): Payer: Medicare HMO

## 2022-02-19 DIAGNOSIS — I5023 Acute on chronic systolic (congestive) heart failure: Secondary | ICD-10-CM

## 2022-02-19 LAB — COOXEMETRY PANEL
Carboxyhemoglobin: 1.9 % — ABNORMAL HIGH (ref 0.5–1.5)
Methemoglobin: <0.7 % (ref 0.0–1.5)
O2 Saturation: 73.3 %
Total hemoglobin: 11.9 g/dL — ABNORMAL LOW (ref 12.0–16.0)

## 2022-02-19 LAB — BASIC METABOLIC PANEL
Anion gap: 7 (ref 5–15)
BUN: 36 mg/dL — ABNORMAL HIGH (ref 8–23)
CO2: 20 mmol/L — ABNORMAL LOW (ref 22–32)
Calcium: 7.6 mg/dL — ABNORMAL LOW (ref 8.9–10.3)
Chloride: 117 mmol/L — ABNORMAL HIGH (ref 98–111)
Creatinine, Ser: 2.01 mg/dL — ABNORMAL HIGH (ref 0.61–1.24)
GFR, Estimated: 34 mL/min — ABNORMAL LOW (ref >60)
Glucose, Bld: 104 mg/dL — ABNORMAL HIGH (ref 70–99)
Potassium: 4.3 mmol/L (ref 3.5–5.1)
Sodium: 144 mmol/L (ref 135–145)

## 2022-02-19 LAB — PARATHYROID HORMONE, INTACT (NO CA): PTH: 80 pg/mL — ABNORMAL HIGH (ref 15–65)

## 2022-02-19 LAB — MAGNESIUM: Magnesium: 2.4 mg/dL (ref 1.7–2.4)

## 2022-02-19 LAB — CALCITRIOL (1,25 DI-OH VIT D): Vit D, 1,25-Dihydroxy: 94.1 pg/mL — ABNORMAL HIGH (ref 24.8–81.5)

## 2022-02-19 MED ORDER — DIGOXIN 125 MCG PO TABS
0.1250 mg | ORAL_TABLET | Freq: Every day | ORAL | Status: DC
Start: 2022-02-19 — End: 2022-02-20
  Administered 2022-02-19 – 2022-02-20 (×2): 0.125 mg via ORAL
  Filled 2022-02-19 (×2): qty 1

## 2022-02-19 MED ORDER — AMIODARONE HCL 200 MG PO TABS
400.0000 mg | ORAL_TABLET | Freq: Two times a day (BID) | ORAL | Status: DC
  Administered 2022-02-19 – 2022-02-20 (×3): 400 mg via ORAL
  Filled 2022-02-19 (×3): qty 2

## 2022-02-19 MED ORDER — CALCIUM GLUCONATE-NACL 1-0.675 GM/50ML-% IV SOLN
1.0000 g | Freq: Once | INTRAVENOUS | Status: AC
  Administered 2022-02-19: 1000 mg via INTRAVENOUS
  Filled 2022-02-19: qty 50

## 2022-02-19 MED ORDER — GADOBUTROL 1 MMOL/ML IV SOLN
7.0000 mL | Freq: Once | INTRAVENOUS | Status: AC | PRN
  Administered 2022-02-19: 7 mL via INTRAVENOUS

## 2022-02-19 NOTE — Progress Notes (Signed)
CARDIAC REHAB PHASE I  ? ?PRE:  Rate/Rhythm: 60 SR ? ?  BP: sitting 134/81 ? ?  SaO2: 98 RA ? ?MODE:  Ambulation: 470 ft  ? ?POST:  Rate/Rhythm: 73 SR ? ?  BP: sitting 154/72  ? ?  SaO2: 98 RA ? ?Tolerated well, no c/o, no assist. Pt sts his main c/o is always dizziness and that he has been having it for 3 years with several MDs investigating. Denied dizziness today.  ? ?Discussed HF booklet with pt voicing reception. Wife present however did not participate in conversation. Pt has cut back significantly with smoking, sts he has been making 1 cigarette last 1 week. Encouraged complete cessation and gave pt resources. ?[id number]  ? ?Yves Dill CES, ACSM ?02/19/2022 ?11:46 AM ? ? ? ? ?

## 2022-02-19 NOTE — TOC Initial Note (Signed)
Transition of Care (TOC) - Initial/Assessment Note  ? ? ?Patient Details  ?Name: Ronald Mcdowell ?MRN: [id number] ?Date of Birth: 03/11/1946 ? ?Transition of Care [facility]) CM/SW Contact:    ?Marcheta Grammes Rexene Alberts, RN ?Phone Number: [phone] ?02/19/2022, 2:30 PM ? ?Clinical Narrative:                 ?HF TOC CM spoke to pt and wife at bedside. Pt states he is independent at home. Will continue to follow for dc needs.  ? ?Expected Discharge Plan: Home/Self Care ?Barriers to Discharge: Continued Medical Work up ? ? ?Patient Goals and CMS Choice ?Patient states their goals for this hospitalization and ongoing recovery are:: wants to remain independent ?CMS Medicare.gov Compare Post Acute Care list provided to:: Patient ?  ? ?Expected Discharge Plan and Services ?Expected Discharge Plan: Home/Self Care ?  ?Discharge Planning Services: CM Consult ?  ?  ?                ?  ?  ?  ?  ?  ?  ?  ?  ?  ?  ? ?Prior Living Arrangements/Services ?  ?Lives with:: Spouse ?Patient language and need for interpreter reviewed:: Yes ?Do you feel safe going back to the place where you live?: Yes      ?Need for Family Participation in Patient Care: No (Comment) ?Care giver support system in place?: No (comment) ?  ?Criminal Activity/Legal Involvement Pertinent to Current Situation/Hospitalization: No - Comment as needed ? ?Activities of Daily Living ?Home Assistive Devices/Equipment: None ?ADL Screening (condition at time of admission) ?Patient's cognitive ability adequate to safely complete daily activities?: Yes ?Is the patient deaf or have difficulty hearing?: No ?Does the patient have difficulty seeing, even when wearing glasses/contacts?: No ?Does the patient have difficulty concentrating, remembering, or making decisions?: No ?Patient able to express need for assistance with ADLs?: Yes ?Does the patient have difficulty dressing or bathing?: No ?Independently performs ADLs?: Yes (appropriate for developmental age) ?Does the patient have  difficulty walking or climbing stairs?: No ?Weakness of Legs: None ?Weakness of Arms/Hands: None ? ?Permission Sought/Granted ?Permission sought to share information with : Case Manager, Family Supports, PCP ?Permission granted to share information with : Yes, Verbal Permission Granted ? Share Information with NAME: Ronald Mcdowell ?   ? Permission granted to share info w Relationship: wife ? Permission granted to share info w Contact Information: [phone] ? ?Emotional Assessment ?Appearance:: Appears stated age ?Attitude/Demeanor/Rapport: Engaged ?Affect (typically observed): Accepting ?Orientation: : Oriented to Self, Oriented to Place, Oriented to  Time, Oriented to Situation ?  ?Psych Involvement: No (comment) ? ?Admission diagnosis:  Atrial fibrillation with RVR ([place]) [I48.91] ?Patient Active Problem List  ? Diagnosis Date Noted  ? Atrial fibrillation with RVR ([place]) 02/16/2022  ? Chronic combined systolic and diastolic heart failure ([place])   ? AKI (acute kidney injury) ([place])   ? Hypomagnesemia   ? NSVT (nonsustained ventricular tachycardia) 01/14/2022  ? Unstable angina ([facility])   ? Ischemic dilated cardiomyopathy ([place])   ? Chest pain 01/13/2022  ? UTI (urinary tract infection) 01/13/2022  ? Ischemic heart disease due to coronary artery obstruction ([place]) 01/09/2022  ? Ventricular ectopy 01/09/2022  ? Hyperthyroidism 09/27/2020  ? Dysphagia 04/19/2020  ? Odynophagia 04/19/2020  ? Eosinophilic esophagitis 97/12/6376  ? Diarrhea 04/19/2020  ? Dehydration, moderate 04/12/2020  ? Dysphasia 04/12/2020  ? Nausea, vomiting and diarrhea 04/12/2020  ? Chronic kidney disease, unspecified 01/14/2020  ? DDD (degenerative disc  disease), cervical 01/14/2020  ? Hematuria 01/14/2020  ? Hiatal hernia 01/14/2020  ? Tobacco use 01/14/2020  ? A-fib ([place]) 12/01/2019  ? Hypokalemia 12/01/2019  ? Arm numbness 12/01/2019  ? Nicotine dependence 12/01/2019  ? Permanent atrial fibrillation ([place]) 12/01/2019  ? Vitamin D deficiency disease  08/24/2019  ? Vitamin D deficiency 08/24/2019  ? Gastro-esophageal reflux disease with esophagitis 03/26/2018  ? Aortic atherosclerosis ([place]) 01/20/2018  ? Hardening of the aorta (main artery of the heart) ([place]) 01/20/2018  ? GERD (gastroesophageal reflux disease) 12/17/2017  ? Gastro-esophageal reflux disease without esophagitis 12/17/2017  ? HLD (hyperlipidemia) 11/04/2017  ? Hyperlipidemia 11/04/2017  ? Loss of weight 11/30/2015  ? Abnormal LFTs 11/30/2015  ? Liver cyst 11/30/2015  ? Other specified diseases of liver 11/30/2015  ? TIA (transient ischemic attack) 04/10/2015  ? Transient cerebral ischemia 04/10/2015  ? Essential (primary) hypertension 08/24/2013  ? Tobacco user   ? Coronary artery disease   ? Ischemic cardiomyopathy   ? DYSPHAGIA ORAL PHASE 04/13/2010  ? Old myocardial infarction 01/31/2010  ? CORONARY ATHEROSCLEROSIS NATIVE CORONARY ARTERY 01/31/2010  ? CAD (coronary artery disease), native coronary artery 01/31/2010  ? ?PCP:  Neale Burly, MD ?Pharmacy:   ?[facility] ?[place] [address] ?Phone: [phone] Fax: [phone] ? ?[place], [place] ?[place] ?[place] [address] ?Phone: [phone] Fax: [phone] ? ?[facility] ?[address] ?[place] [address] ?Phone: [phone] Fax: [phone] ? ? ? ? ?Social Determinants of Health (SDOH) Interventions ?  ? ?Readmission Risk Interventions ?   ? View : No data to display.  ?  ?  ?  ? ? ? ?

## 2022-02-19 NOTE — Anesthesia Postprocedure Evaluation (Signed)
Anesthesia Post Note ? ?Patient: Ronald Mcdowell ? ?Procedure(s) Performed: TRANSESOPHAGEAL ECHOCARDIOGRAM (TEE) ?CARDIOVERSION ? ?  ? ?Patient location during evaluation: Endoscopy ?Anesthesia Type: General ?Level of consciousness: awake and alert ?Pain management: pain level controlled ?Vital Signs Assessment: post-procedure vital signs reviewed and stable ?Respiratory status: spontaneous breathing, nonlabored ventilation, respiratory function stable and patient connected to nasal cannula oxygen ?Cardiovascular status: blood pressure returned to baseline and stable ?Postop Assessment: no apparent nausea or vomiting ?Anesthetic complications: no ? ? ?No notable events documented. ? ?Last Vitals:  ?Vitals:  ? 02/19/22 0502 02/19/22 0900  ?BP: 138/79 137/79  ?Pulse: 61 65  ?Resp: 16 18  ?Temp: 37 ?C 37.1 ?C  ?SpO2:  100%  ?  ?Last Pain:  ?Vitals:  ? 02/19/22 0900  ?TempSrc: Oral  ?PainSc: 0-No pain  ? ? ?  ?  ?  ?  ?  ?  ? ?[place] ? ? ? ? ?

## 2022-02-19 NOTE — Progress Notes (Addendum)
? ? Advanced Heart Failure Rounding Note ? ?PCP-Cardiologist: Carlyle Dolly, MD  ?[facility]: Dr. Aundra Dubin  ? ?Subjective:   ? ?Initial co-ox 51%. Started on milrinone 0.25. Co-ox improved to 68%.  ? ?Yesterday, milrinone turned down to 0.125. Underwent successful TEE/DCCV back to NSR.  ? ?Today, remains on milrinone 0.125. Co-ox 73%. Maintaining NSR. CVP 6-7  ? ?SCr improving, 2.58>>2.30>>2.01  ? ?Given calcium gluconate yesterday for low CA. Ionized calcium level pending  ? ?K 4.3 ?Mg 2.4  ? ?Feels well today. No complaints. Denies dyspnea. Able to lay flat for cMRI this morning w/o orthopnea/ PND.  ? ?TEE: EF 20%, moderate LV dilation, mildly decreased RV systolic function.  ? ?Objective:   ?Weight Range: ?59 kg ?Body mass index is 21.64 kg/m?.  ? ?Vital Signs:   ?Temp:  [97.5 ?F (36.4 ?C)-98.6 ?F (37 ?C)] 98.6 ?F (37 ?C) (03/28 0502) ?Pulse Rate:  [48-72] 61 (03/28 0502) ?Resp:  [16-20] 16 (03/28 0502) ?BP: (105-138)/(52-83) 138/79 (03/28 0502) ?SpO2:  [95 %-99 %] 95 % (03/27 1403) ?Weight:  [59 kg] 59 kg (03/28 0502) ?Last BM Date : 02/18/22 ? ?Weight change: ?Filed Weights  ? 02/16/22 0548 02/19/22 0502  ?Weight: 62.1 kg 59 kg  ? ? ?Intake/Output:  ? ?Intake/Output Summary (Last 24 hours) at 02/19/2022 0751 ?Last data filed at 02/19/2022 [phone] ?Gross per 24 hour  ?Intake 543.71 ml  ?Output 350 ml  ?Net 193.71 ml  ?  ? ? ?Physical Exam  ?  ?CVP 6-7  ?General:  Well appearing. No respiratory difficulty ?HEENT: normal ?Neck: supple. no JVD. Carotids 2+ bilat; no bruits. No lymphadenopathy or thyromegaly appreciated. ?Cor: PMI nondisplaced. Regular rate & rhythm. No rubs, gallops or murmurs. ?Lungs: clear ?Abdomen: soft, nontender, nondistended. No hepatosplenomegaly. No bruits or masses. Good bowel sounds. ?Extremities: no cyanosis, clubbing, rash, edema + RUE PICC  ?Neuro: alert & oriented x 3, cranial nerves grossly intact. moves all 4 extremities w/o difficulty. Affect pleasant. ? ? ? ?Telemetry  ? ?NSR, 60s  personally reviewed  ? ?EKG  ?  ?No new EKG to review  ? ?Labs  ?  ?CBC ?Recent Labs  ?  02/17/22 ?0343 02/18/22 ?1150  ?WBC 10.7* 9.7  ?HGB 12.8* 13.3  ?HCT 37.0* 39.7  ?MCV 89.4 90.6  ?PLT 87* 93*  ? ?Basic Metabolic Panel ?Recent Labs  ?  02/16/22 ?1845 02/17/22 ?4742 02/18/22 ?5956 02/18/22 ?1150 02/19/22 ?0522  ?NA 139   < > 141  --  144  ?K 3.0*   < > 3.2*  --  4.3  ?CL 108   < > 113*  --  117*  ?CO2 19*   < > 20*  --  20*  ?GLUCOSE 92   < > 106*  --  104*  ?BUN 56*   < > 44*  --  36*  ?CREATININE 2.72*   < > 2.30*  --  2.01*  ?CALCIUM 6.5*   < > 6.4*  --  7.6*  ?MG  --    < > 1.7  --  2.4  ?PHOS 3.8  --   --  2.9  --   ? < > = values in this interval not displayed.  ? ?Liver Function Tests ?Recent Labs  ?  02/16/22 ?0920 02/16/22 ?1845  ?AST 32  --   ?ALT 35  --   ?ALKPHOS 44  --   ?BILITOT 1.9*  --   ?PROT 7.3  --   ?ALBUMIN 3.6 3.8  ? ?No  results for input(s): LIPASE, AMYLASE in the last 72 hours. ?Cardiac Enzymes ?No results for input(s): CKTOTAL, CKMB, CKMBINDEX, TROPONINI in the last 72 hours. ? ?BNP: ?BNP (last 3 results) ?Recent Labs  ?  01/13/22 ?1245 02/16/22 ?0908  ?BNP 905.0* 758.1*  ? ? ?ProBNP (last 3 results) ?No results for input(s): PROBNP in the last 8760 hours. ? ? ?D-Dimer ?No results for input(s): DDIMER in the last 72 hours. ?Hemoglobin A1C ?No results for input(s): HGBA1C in the last 72 hours. ?Fasting Lipid Panel ?Recent Labs  ?  02/17/22 ?0343  ?CHOL 126  ?HDL 26*  ?[place] 71  ?TRIG 145  ?CHOLHDL 4.8  ? ?Thyroid Function Tests ?No results for input(s): TSH, T4TOTAL, T3FREE, THYROIDAB in the last 72 hours. ? ?Invalid input(s): FREET3 ? ?Other results: ? ? ?Imaging  ? ? ?No results found. ? ? ?Medications:   ? ? ?Scheduled Medications: ? apixaban  5 mg Oral BID  ? atorvastatin  80 mg Oral Daily  ? Chlorhexidine Gluconate Cloth  6 each Topical Daily  ? cholecalciferol  1,000 Units Oral Daily  ? midodrine  5 mg Oral TID WC  ? pantoprazole  40 mg Oral Daily  ? sodium chloride flush  10-40  mL Intracatheter Q12H  ? ? ?Infusions: ? amiodarone 30 mg/hr (02/18/22 2054)  ? milrinone 0.125 mcg/kg/min (02/18/22 2054)  ? ? ?PRN Medications: ?acetaminophen, nitroGLYCERIN, ondansetron (ZOFRAN) IV, sodium chloride flush ? ? ? ?Patient Profile  ? ?76 y/o male w/ chronic systolic heart failure due to NICM, CAD s/p remote LCx PCI w/ nonobstructive CAD on recent cath, persistent atrial fibrillation, Stage III CKD and NSVT, admitted w/ a/c CHF w/ low output. ? ?Assessment/Plan  ? ?1. Acute on chronic systolic CHF: Suspect primarily nonischemic cardiomyopathy.  Echo in 2020 with EF 50%.  Echo in 2/23 with EF < 20%, moderate-severe LV dilation, mild RVE with mildly decreased RV systolic function.  The drop does not appear to be due to CAD, cath in 2/23 with nonobstructive disease.  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  Possible viral myocarditis, possible viral PNA earlier this year.  Also certainly possible this could be a tachy-mediated CMP given atrial fibrillation with RVR at admission.  Creatinine up to 3.3 initially, Entresto/spironolactone/torsemide held. With concern for low output, PICC was placed. Initial co-ox 51%. Started on milrinone 0.25 + IV Lasix. Co-ox improved w/ good diuresis. Tolerating milrinone wean, currently on 0.125 w/ Co-ox 73%. CVP 6-7. SCr trending down. cMRI completed this morning. Results pending.  ?- Stop milrinone today and follow co-ox  ?- Continue to hold diuretics today. Likely resume torsemide tomorrow   ?- Continue to hold Entresto, spironolactone with AKI.  ?- No SGLT2 inhibitor for now with h/o UTIs.  ?2. Atrial fibrillation: Persistent since at least 9/22.  Cannot rule out component of tachy-mediated CMP.  HR has generally seemed to be well-controlled, but was in RVR at admission. S/p successful TEE/DCCV 3/27. Maintaining NSR today  ?- Switch to PO amio 400 mg bid (stopping milrinone)   ?- Continue Eliquis.  ?- If he reverts back, consider  future AF ablation.  ?3. NSVT: Has been wearing Lifevest at home.  Beeping on Lifevest seems to be from AF with RVR.  14 beat run NSVT this admit  ?- Would send back out on Lifevest after this admission.  ?- Will need followup echo in few months on GDMT to decide on ICD. ?- Keep K > 4.0  and Mg > 2.0  ?- continue PO amio  ?4. AKI on CKD stage 3: Suspect Cardiorenal (initial co-ox 51%) Creatinine trending back down, 2.58>>2.30>>2.01 today, baseline 1.6. CVP 6-7  ?- Hold lasix today  ?- stop milrinone and follow co-ox ?- continue midodrine for BP support for renal perfusion  ?- follow BMP  ?5. CAD: PCI to LCx remotely.  LHC in 2/23 with nonobstructive CAD.  Cannot explain CMP.  ?- Stop ASA given Eliquis use.  ?- Continue atorvastatin.  ?7. Hypokalemia/Hypomagnesemia  ?- Stable today, K 4.3, Mg 2.4  ?- supp lytes PRN  ?8. Hypocalcemia  ?- critically low calcium. Calcium 6.4, corrected 6.7>> given calcium gluconate. F/u Ionized calcium in process  ?  ?Length of Stay: 3 ? ?Lyda Jester, PA-C  ?02/19/2022, 7:51 AM ? ?Advanced Heart Failure Team ?Pager [phone] (M-F; 7a - 5p)  ?Please contact [facility] for night-coverage after hours (5p -7a ) and weekends on [email or web address] ? ?Patient seen with PA, agree with the above note.  ? ?CVP 6-7 this morning, co-ox 73%.  He remains on milrinone 0.125.  He is in NSR on amiodarone gtt.  Creatinine down to 2.01.  Feels good today, SBP 130s on midodrine.  ? ?General: NAD ?Neck: No JVD, no thyromegaly or thyroid nodule.  ?Lungs: Clear to auscultation bilaterally with normal respiratory effort. ?CV: Lateral PMI.  Heart regular S1/S2, no S3/S4, no murmur.  No peripheral edema.   ?Abdomen: Soft, nontender, no hepatosplenomegaly, no distention.  ?Skin: Intact without lesions or rashes.  ?Neurologic: Alert and oriented x 3.  ?Psych: Normal affect. ?Extremities: No clubbing or cyanosis.  ?HEENT: Normal.  ? ?Stop milrinone today.  Will start digoxin 0.125 with creatinine trending down.   Hold other GDMT with orthostasis this admission.  Think he is doing better back in NSR, will stop midodrine also. CVP low, no diuretic today.  ? ?Transition amiodarone to po.  Continue Eliquis, on borderline f

## 2022-02-20 ENCOUNTER — Other Ambulatory Visit (HOSPITAL_COMMUNITY): Payer: Self-pay

## 2022-02-20 LAB — BASIC METABOLIC PANEL
Anion gap: 6 (ref 5–15)
BUN: 22 mg/dL (ref 8–23)
CO2: 21 mmol/L — ABNORMAL LOW (ref 22–32)
Calcium: 7.9 mg/dL — ABNORMAL LOW (ref 8.9–10.3)
Chloride: 115 mmol/L — ABNORMAL HIGH (ref 98–111)
Creatinine, Ser: 1.9 mg/dL — ABNORMAL HIGH (ref 0.61–1.24)
GFR, Estimated: 37 mL/min — ABNORMAL LOW (ref >60)
Glucose, Bld: 97 mg/dL (ref 70–99)
Potassium: 4.2 mmol/L (ref 3.5–5.1)
Sodium: 142 mmol/L (ref 135–145)

## 2022-02-20 LAB — COOXEMETRY PANEL
Carboxyhemoglobin: 2 % — ABNORMAL HIGH (ref 0.5–1.5)
Methemoglobin: <0.7 % (ref 0.0–1.5)
O2 Saturation: 69 %
Total hemoglobin: 11.7 g/dL — ABNORMAL LOW (ref 12.0–16.0)

## 2022-02-20 LAB — CALCIUM, IONIZED
Calcium, Ionized, Serum: 4 mg/dL — ABNORMAL LOW (ref 4.5–5.6)
Calcium, Ionized, Serum: 4.5 mg/dL (ref 4.5–5.6)

## 2022-02-20 MED ORDER — DIGOXIN 125 MCG PO TABS
0.1250 mg | ORAL_TABLET | Freq: Every day | ORAL | 6 refills | Status: DC
  Filled 2022-02-20: qty 30, 30d supply, fill #0

## 2022-02-20 MED ORDER — SACUBITRIL-VALSARTAN 24-26 MG PO TABS
1.0000 | ORAL_TABLET | Freq: Two times a day (BID) | ORAL | 6 refills | Status: DC
  Filled 2022-02-20: qty 60, 30d supply, fill #0

## 2022-02-20 MED ORDER — TORSEMIDE 20 MG PO TABS
20.0000 mg | ORAL_TABLET | ORAL | 3 refills | Status: DC
  Filled 2022-02-20: qty 30, 60d supply, fill #0

## 2022-02-20 MED ORDER — AMIODARONE HCL 200 MG PO TABS
ORAL_TABLET | ORAL | 6 refills | Status: DC
  Filled 2022-02-20: qty 60, 32d supply, fill #0

## 2022-02-20 MED ORDER — SACUBITRIL-VALSARTAN 24-26 MG PO TABS
1.0000 | ORAL_TABLET | Freq: Two times a day (BID) | ORAL | Status: DC
  Administered 2022-02-20: 1 via ORAL
  Filled 2022-02-20: qty 1

## 2022-02-20 NOTE — Progress Notes (Addendum)
? ? Advanced Heart Failure Rounding Note ? ?PCP-Cardiologist: Carlyle Dolly, MD  ?[facility]: Dr. Aundra Dubin  ? ?Subjective:   ? ?Yesterday milrinone and midodrine stopped. CO-OX stable at 69%  ? ?Wants to go home. Denies SOB. Denies dizziness.  ? ?TEE: EF 20%, moderate LV dilation, mildly decreased RV systolic function.  ? ?cMRI:  ?1.  Severely dilated LV with diffuse hypokinesis, EF 24%. ?2.  Normal RV size with mildly decreased systolic function, EF 00%. ?3.  Delayed enhancement images show coronary-pattern LGE in a ?circumflex coronary artery distribution, suspect prior MI involving ?the LCx. ? ?Objective:   ?Weight Range: ?62.8 kg ?Body mass index is 23.03 kg/m?.  ? ?Vital Signs:   ?Temp:  [98 ?F (36.7 ?C)-98.8 ?F (37.1 ?C)] 98 ?F (36.7 ?C) (03/29 0526) ?Pulse Rate:  [54-65] 54 (03/29 0526) ?Resp:  [16-18] 16 (03/28 1600) ?BP: (126-137)/(70-79) 137/78 (03/29 0526) ?SpO2:  [97 %-100 %] 97 % (03/29 0526) ?Weight:  [62.8 kg] 62.8 kg (03/29 0526) ?Last BM Date : 02/19/22 ? ?Weight change: ?Filed Weights  ? 02/16/22 0548 02/19/22 0502 02/20/22 0526  ?Weight: 62.1 kg 59 kg 62.8 kg  ? ? ?Intake/Output:  ? ?Intake/Output Summary (Last 24 hours) at 02/20/2022 0817 ?Last data filed at 02/19/2022 1149 ?Gross per 24 hour  ?Intake 0 ml  ?Output --  ?Net 0 ml  ?  ? ? ?Physical Exam  ? CVP in the chair 2 ?General: Sitting in the chair. No resp difficulty ?HEENT: normal ?Neck: supple. no JVD. Carotids 2+ bilat; no bruits. No lymphadenopathy or thryomegaly appreciated. ?Cor: PMI nondisplaced. Regular rate & rhythm. No rubs, gallops or murmurs. ?Lungs: clear ?Abdomen: soft, nontender, nondistended. No hepatosplenomegaly. No bruits or masses. Good bowel sounds. ?Extremities: no cyanosis, clubbing, rash, edema. RUE PICC ?Neuro: alert & orientedx3, cranial nerves grossly intact. moves all 4 extremities w/o difficulty. Affect pleasant ? ? ?Telemetry  ?SB-SR 50-60s  ? ? ?EKG  ?  ?No new EKG to review  ? ?Labs  ?  ?CBC ?Recent Labs  ?   02/18/22 ?1150  ?WBC 9.7  ?HGB 13.3  ?HCT 39.7  ?MCV 90.6  ?PLT 93*  ? ?Basic Metabolic Panel ?Recent Labs  ?  02/18/22 ?0712 02/18/22 ?1150 02/19/22 ?0522 02/20/22 ?0540  ?NA 141  --  144 142  ?K 3.2*  --  4.3 4.2  ?CL 113*  --  117* 115*  ?CO2 20*  --  20* 21*  ?GLUCOSE 106*  --  104* 97  ?BUN 44*  --  36* 22  ?CREATININE 2.30*  --  2.01* 1.90*  ?CALCIUM 6.4*  --  7.6* 7.9*  ?MG 1.7  --  2.4  --   ?PHOS  --  2.9  --   --   ? ?Liver Function Tests ?No results for input(s): AST, ALT, ALKPHOS, BILITOT, PROT, ALBUMIN in the last 72 hours. ? ?No results for input(s): LIPASE, AMYLASE in the last 72 hours. ?Cardiac Enzymes ?No results for input(s): CKTOTAL, CKMB, CKMBINDEX, TROPONINI in the last 72 hours. ? ?BNP: ?BNP (last 3 results) ?Recent Labs  ?  01/13/22 ?7622 02/16/22 ?0908  ?BNP 905.0* 758.1*  ? ? ?ProBNP (last 3 results) ?No results for input(s): PROBNP in the last 8760 hours. ? ? ?D-Dimer ?No results for input(s): DDIMER in the last 72 hours. ?Hemoglobin A1C ?No results for input(s): HGBA1C in the last 72 hours. ?Fasting Lipid Panel ?No results for input(s): CHOL, HDL, LDLCALC, TRIG, CHOLHDL, LDLDIRECT in the last 72 hours. ? ?Thyroid  Function Tests ?No results for input(s): TSH, T4TOTAL, T3FREE, THYROIDAB in the last 72 hours. ? ?Invalid input(s): FREET3 ? ?Other results: ? ? ?Imaging  ? ? ?MR CARDIAC MORPHOLOGY W WO CONTRAST ? ?Result Date: 02/19/2022 ?CLINICAL DATA:  Cardiomyopathy of uncertain etiology. EXAM: CARDIAC MRI TECHNIQUE: The patient was scanned on a 1.5 Tesla GE magnet. A dedicated cardiac coil was used. Functional imaging was done using Fiesta sequences. 2,3, and 4 chamber views were done to assess for RWMA's. Modified Simpson's rule using a short axis stack was used to calculate an ejection fraction on a dedicated work Conservation officer, nature. The patient received 8 cc of Multihance. After 10 minutes inversion recovery sequences were used to assess for infiltration and scar tissue.  FINDINGS: Limited images of the lung fields showed no gross abnormalities. Small pericardial effusion primarily adjacent to the right ventricle. Severely dilated left ventricle with normal wall thickness, global hypokinesis with EF 24%. No left ventricular thrombus noted. Normal right ventricular size, mildly decreased systolic function with EF 45%. Mild left atrial enlargement. Normal right atrium. Mild mitral regurgitation. Trileaflet aortic valve, no significant regurgitation or stenosis. On delayed enhancement imaging, there was around 50% wall thickness subendocardial late gadolinium enhancement (LGE) in the basal inferior and inferolateral walls and the mid inferolateral wall. MEASUREMENTS: MEASUREMENTS LVEDV 316 mL LVEDVI 192 mL/m2 LVSV 75 LVEF 24% RVEDV 150 mL RVSV 67 mL RVEF 45% T1 1066, ECV 30% in septum IMPRESSION: 1.  Severely dilated LV with diffuse hypokinesis, EF 24%. 2.  Normal RV size with mildly decreased systolic function, EF 78%. 3. Delayed enhancement images show coronary-pattern LGE in a circumflex coronary artery distribution, suspect prior MI involving the LCx. I do not think that the amount of scarring from prior MI explains the extent of his cardiomyopathy. No evidence, however, for myocarditis or infiltrative disease by LGE. Angelica Frandsen Electronically Signed   By: Loralie Champagne M.D.   On: 02/19/2022 18:37   ? ? ?Medications:   ? ? ?Scheduled Medications: ? amiodarone  400 mg Oral BID  ? apixaban  5 mg Oral BID  ? atorvastatin  80 mg Oral Daily  ? Chlorhexidine Gluconate Cloth  6 each Topical Daily  ? cholecalciferol  1,000 Units Oral Daily  ? digoxin  0.125 mg Oral Daily  ? pantoprazole  40 mg Oral Daily  ? sodium chloride flush  10-40 mL Intracatheter Q12H  ? ? ?Infusions: ? ? ? ?PRN Medications: ?acetaminophen, nitroGLYCERIN, ondansetron (ZOFRAN) IV, sodium chloride flush ? ? ? ?Patient Profile  ? ?76 y/o male w/ chronic systolic heart failure due to NICM, CAD s/p remote LCx PCI w/  nonobstructive CAD on recent cath, persistent atrial fibrillation, Stage III CKD and NSVT, admitted w/ a/c CHF w/ low output. ? ?Assessment/Plan  ? ?1. Acute on chronic systolic CHF: Suspect primarily nonischemic cardiomyopathy.  Echo in 2020 with EF 50%.  Echo in 2/23 with EF < 20%, moderate-severe LV dilation, mild RVE with mildly decreased RV systolic function.  The drop does not appear to be due to CAD, cath in 2/23 with nonobstructive disease.  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  Possible viral myocarditis, possible viral PNA earlier this year.  Also certainly possible this could be a tachy-mediated CMP given atrial fibrillation with RVR at admission.  Creatinine up to 3.3 initially, Entresto/spironolactone/torsemide held. With concern for low output, PICC was placed. Initial co-ox 51%. Started on milrinone 0.25 + IV Lasix. Co-ox  improved w/ good diuresis.  Yesterday milrinone stopped. CO-OX stable 69% ?- CVP 2-3 in the chair. Hold diuretics and start tomorrow. ?- Continue digoxin 0.125 mg daily. Check level at his follow up.  ?-  Start entesto 24-26 mg twice a day  ?- Hold spironolactone with AKI, restart eventually as outpatient.  ?- Hold bb with bradycardia.  ?- Start torsemide 20 mg every other day Friday ?- No SGLT2 inhibitor for now with h/o UTIs.  ?2. Atrial fibrillation: Persistent since at least 9/22.  Cannot rule out component of tachy-mediated CMP.  HR has generally seemed to be well-controlled, but was in RVR at admission. S/p successful TEE/DCCV 3/27. ?-In SR today.  ?- Continue PO amio 400 mg bid x7 days then 200 mg twice a day x7 days then 200 mg daily    ?- Continue Eliquis.  ?- If he reverts back, consider future AF ablation.  ?3. NSVT: Has been wearing Lifevest at home.  Beeping on Lifevest seems to be from AF with RVR.  14 beat run NSVT this admit  ?- Would send back out on Lifevest after this admission.  ?- Will need followup echo in few months on  GDMT to decide on ICD. ?- Keep K > 4.0 and Mg > 2.0  ?- continue PO amio  ?4. AKI on CKD stage 3: Suspect Cardiorenal (initial co-ox 51%) Creatinine trending back down, 2.58>>2.30>>2.01 >>1.9 today, baseline 1.6.  ?

## 2022-02-20 NOTE — Discharge Summary (Addendum)
Advanced Heart Failure Team ? ?Discharge Summary  ? ?Patient ID: Ronald Mcdowell ?MRN: [id number], DOB/AGE: 1946/07/29 76 y.o. Admit date: 02/16/2022 ?D/C date:     02/20/2022  ? ?Primary Discharge Diagnoses:  ?1. Acute on chronic systolic CHF: Suspect primarily nonischemic cardiomyopathy.  ?2. Atrial fibrillation: Persistent since at least 9/22.  ?3. NSVT ?4. AKI on CKD stage 3 ?5. CAD: PCI to LCx remotely ?6.  Hypokalemia/Hypomagnesemia  ?7.  Hypocalcemia  ? ?Hospital Course:   ?76 y/o male w/ chronic systolic heart failure due to NICM, CAD s/p remote LCx PCI w/ nonobstructive CAD on recent cath, persistent atrial fibrillation, Stage III CKD and NSVT, admitted w/ a/c CHF w/ low output. ?PICC placed and started on milrinone. Diuresed with IV lasix. Once diuresed , milrinone weaned off. Plan to restart bb at his follow up. Torsemide was not restarted given low CVP. No on bb due to bradycardia and not on SGLT2i due to yeast infections. Also not placed on spiro due to AKI.  Plan to check digoxin and BMET at his follow up.  ? ?PTA he was wearing Life Vest and this was resumed at discharge. See below for detailed problems list. He will contine to be followed closely in the HF clinic.  ? ?1. Acute on chronic systolic CHF: Suspect primarily nonischemic cardiomyopathy.  Echo in 2020 with EF 50%.  Echo in 2/23 with EF < 20%, moderate-severe LV dilation, mild RVE with mildly decreased RV systolic function.  The drop does not appear to be due to CAD, cath in 2/23 with nonobstructive disease.  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  Possible viral myocarditis, possible viral PNA earlier this year.  Also certainly possible this could be a tachy-mediated CMP given atrial fibrillation with RVR at admission.  Creatinine up to 3.3 initially, Entresto/spironolactone/torsemide held. With concern for low output, PICC was placed. Initial co-ox 51%. Started on milrinone 0.25 + IV Lasix. Co-ox improved  w/ good diuresis.  Milrinone gradually weaned off with stable CO-OX.  ?- Continue digoxin 0.125 mg daily. Check level at his follow up.  ?-  Resume  entesto 24-26 mg twice a day  ?- Hold spironolactone with AKI, restart eventually as outpatient.  ?- Hold bb with bradycardia.  ?- Will not restart torsemide due to low CVP.  ?- No SGLT2 inhibitor for now with h/o UTIs.  ?2. Atrial fibrillation: Persistent since at least 9/22.  Cannot rule out component of tachy-mediated CMP.  HR has generally seemed to be well-controlled, but was in RVR at admission. S/p successful TEE/DCCV 3/27. ?-In SR/SB today.  ?- Continue PO amio 400 mg bid x7 days then 200 mg twice a day x7 days then 200 mg daily    ?- Continue Eliquis.  ?- If he reverts back, consider future AF ablation.  ?3. NSVT: Has been wearing Lifevest at home.  Beeping on Lifevest seems to be from AF with RVR.  14 beat run NSVT this admit  ?- Would send back out on Lifevest after this admission.  ?- Will need followup echo in few months on GDMT to decide on ICD. ?- Keep K > 4.0 and Mg > 2.0  ?- continue PO amio  ?4. AKI on CKD stage 3: Suspect Cardiorenal (initial co-ox 51%) Creatinine trending back down, 2.58>>2.30>>2.01 >>1.9 today, baseline 1.6.  ?- Diuretics stopped at d/c. Check BMET next week.  ?5. CAD: PCI to LCx remotely.  LHC in 2/23 with nonobstructive CAD.  Cannot explain CMP.  ?- Stop ASA given Eliquis use.  ?- Continue atorvastatin.  ?7. Hypokalemia/Hypomagnesemia  ?- Stable today, K 4.2 ?- supp lytes PRN  ?8. Hypocalcemia  ?- critically low calcium. Calcium 6.4, corrected 6.7>> given calcium gluconate. F/u Ionized calcium in process  ? - Calcium 7.9 today.  ?Discharge Weight: 138 pounds.  ?Discharge Vitals: Blood pressure 133/73, pulse 64, temperature 97.9 ?F (36.6 ?C), temperature source Oral, resp. rate 16, height '5\' 5"'$  (1.651 m), weight 62.8 kg, SpO2 97 %. ? ?Labs: ?Lab Results  ?Component Value Date  ? WBC 9.7 02/18/2022  ? HGB 13.3 02/18/2022  ? HCT 39.7  02/18/2022  ? MCV 90.6 02/18/2022  ? PLT 93 (L) 02/18/2022  ?  ?Recent Labs  ?Lab 02/16/22 ?0920 02/16/22 ?1845 02/20/22 ?0540  ?NA 137   < > 142  ?K 2.7*   < > 4.2  ?CL 106   < > 115*  ?CO2 18*   < > 21*  ?BUN 60*   < > 22  ?CREATININE 2.83*   < > 1.90*  ?CALCIUM 6.7*   < > 7.9*  ?PROT 7.3  --   --   ?BILITOT 1.9*  --   --   ?ALKPHOS 44  --   --   ?ALT 35  --   --   ?AST 32  --   --   ?GLUCOSE 95   < > 97  ? < > = values in this interval not displayed.  ? ?Lab Results  ?Component Value Date  ? CHOL 126 02/17/2022  ? HDL 26 (L) 02/17/2022  ? [place] 71 02/17/2022  ? TRIG 145 02/17/2022  ? ?BNP (last 3 results) ?Recent Labs  ?  01/13/22 ?7829 02/16/22 ?0908  ?BNP 905.0* 758.1*  ? ? ?ProBNP (last 3 results) ?No results for input(s): PROBNP in the last 8760 hours. ? ? ?Diagnostic Studies/Procedures  ? ?MR CARDIAC MORPHOLOGY W WO CONTRAST ? ?Result Date: 02/19/2022 ?CLINICAL DATA:  Cardiomyopathy of uncertain etiology. EXAM: CARDIAC MRI TECHNIQUE: The patient was scanned on a 1.5 Tesla GE magnet. A dedicated cardiac coil was used. Functional imaging was done using Fiesta sequences. 2,3, and 4 chamber views were done to assess for RWMA's. Modified Simpson's rule using a short axis stack was used to calculate an ejection fraction on a dedicated work Conservation officer, nature. The patient received 8 cc of Multihance. After 10 minutes inversion recovery sequences were used to assess for infiltration and scar tissue. FINDINGS: Limited images of the lung fields showed no gross abnormalities. Small pericardial effusion primarily adjacent to the right ventricle. Severely dilated left ventricle with normal wall thickness, global hypokinesis with EF 24%. No left ventricular thrombus noted. Normal right ventricular size, mildly decreased systolic function with EF 45%. Mild left atrial enlargement. Normal right atrium. Mild mitral regurgitation. Trileaflet aortic valve, no significant regurgitation or stenosis. On delayed  enhancement imaging, there was around 50% wall thickness subendocardial late gadolinium enhancement (LGE) in the basal inferior and inferolateral walls and the mid inferolateral wall. MEASUREMENTS: MEASUREMENTS LVEDV 316 mL LVEDVI 192 mL/m2 LVSV 75 LVEF 24% RVEDV 150 mL RVSV 67 mL RVEF 45% T1 1066, ECV 30% in septum IMPRESSION: 1.  Severely dilated LV with diffuse hypokinesis, EF 24%. 2.  Normal RV size with mildly decreased systolic function, EF 56%. 3. Delayed enhancement images show coronary-pattern LGE in a circumflex coronary artery distribution, suspect prior MI involving the LCx. I do not think that the amount of scarring from  prior MI explains the extent of his cardiomyopathy. No evidence, however, for myocarditis or infiltrative disease by LGE. Naiomi Musto Electronically Signed   By: Loralie Champagne M.D.   On: 02/19/2022 18:37   ? ?Discharge Medications  ? ?Allergies as of 02/20/2022   ? ?   Reactions  ? Peanut Butter Flavor Swelling  ? ?  ? ?  ?Medication List  ?  ? ?STOP taking these medications   ? ?aspirin 81 MG EC tablet ?  ?metoprolol succinate 25 MG 24 hr tablet ?Commonly known as: TOPROL-XL ?  ?spironolactone 25 MG tablet ?Commonly known as: ALDACTONE ?  ?torsemide 20 MG tablet ?Commonly known as: DEMADEX ?  ? ?  ? ?TAKE these medications   ? ?amiodarone 200 MG tablet ?Commonly known as: PACERONE ?Take 2 tablets (400 mg) twice a day for 7 days. Then take 1 tablet  (200 mg) twice a day for 7 days. Then take 1 tablet  (200 mg) daily. ?  ?apixaban 5 MG Tabs tablet ?Commonly known as: Eliquis ?TAKE 1 TABLET(5 MG) BY MOUTH TWICE DAILY ?What changed:  ?how much to take ?how to take this ?when to take this ?  ?atorvastatin 80 MG tablet ?Commonly known as: LIPITOR ?Take 1 tablet (80 mg total) by mouth daily. ?  ?baclofen 10 MG tablet ?Commonly known as: LIORESAL ?Take 10 mg by mouth 3 (three) times daily as needed. ?  ?digoxin 0.125 MG tablet ?Commonly known as: LANOXIN ?Take 1 tablet (0.125 mg total) by  mouth daily. ?  ?nitroGLYCERIN 0.4 MG SL tablet ?Commonly known as: NITROSTAT ?Place 1 tablet (0.4 mg total) under the tongue every 5 (five) minutes as needed for chest pain. ?  ?pantoprazole 40 MG tablet ?Co

## 2022-02-26 ENCOUNTER — Telehealth: Payer: Self-pay | Admitting: Cardiology

## 2022-02-26 NOTE — Telephone Encounter (Signed)
Pt is having a hard time getting his Eliquis from his mail order pharmacy- would like to know if he could get some samples. He has not taken any since last Friday.  ? ?He was admitted in the hospital at [facility] from 3/24-3/29 ? ?Please call [phone] or can call wifes cell phone [phone] ?

## 2022-02-26 NOTE — Telephone Encounter (Signed)
Request for Eliquis '5mg'$  samples: ?Indication:  Atrial Fib ?Last office visit: 08/07/21  Zandra Abts  MD ?Scr: 1.90 on 02/20/22 ?Age: 76 ?Weight: 65.5kg ? ?Based on above findings Eliquis '5mg'$  twice daily is the appropriate dose.  Ok to give samples. ? ?2 boxes of eliquis '5mg'$  samples given to pt. ?Lot [id number]   Exp date 01/2024 ? ?Pt appreciative. ? ?

## 2022-02-27 ENCOUNTER — Encounter: Payer: Self-pay | Admitting: Internal Medicine

## 2022-02-27 ENCOUNTER — Encounter (HOSPITAL_COMMUNITY): Payer: Self-pay | Admitting: Cardiology

## 2022-02-27 ENCOUNTER — Ambulatory Visit (HOSPITAL_COMMUNITY)
Admit: 2022-02-27 | Discharge: 2022-02-27 | Disposition: A | Discharge status: Home / Self Care | Payer: Medicare HMO | Source: Ambulatory Visit | Attending: Cardiology | Admitting: Cardiology

## 2022-02-27 VITALS — BP 160/80 | HR 56 | Wt 145.0 lb

## 2022-02-27 DIAGNOSIS — Z955 Presence of coronary angioplasty implant and graft: Secondary | ICD-10-CM | POA: Diagnosis not present

## 2022-02-27 DIAGNOSIS — I4729 Other ventricular tachycardia: Secondary | ICD-10-CM | POA: Diagnosis not present

## 2022-02-27 DIAGNOSIS — I2510 Atherosclerotic heart disease of native coronary artery without angina pectoris: Secondary | ICD-10-CM | POA: Diagnosis not present

## 2022-02-27 DIAGNOSIS — I130 Hypertensive heart and chronic kidney disease with heart failure and stage 1 through stage 4 chronic kidney disease, or unspecified chronic kidney disease: Secondary | ICD-10-CM | POA: Diagnosis not present

## 2022-02-27 DIAGNOSIS — N183 Chronic kidney disease, stage 3 unspecified: Secondary | ICD-10-CM

## 2022-02-27 DIAGNOSIS — I5042 Chronic combined systolic (congestive) and diastolic (congestive) heart failure: Secondary | ICD-10-CM | POA: Diagnosis not present

## 2022-02-27 DIAGNOSIS — Z79899 Other long term (current) drug therapy: Secondary | ICD-10-CM | POA: Diagnosis not present

## 2022-02-27 DIAGNOSIS — I4819 Other persistent atrial fibrillation: Secondary | ICD-10-CM | POA: Diagnosis not present

## 2022-02-27 DIAGNOSIS — Z7901 Long term (current) use of anticoagulants: Secondary | ICD-10-CM | POA: Diagnosis not present

## 2022-02-27 DIAGNOSIS — I472 Ventricular tachycardia, unspecified: Secondary | ICD-10-CM | POA: Insufficient documentation

## 2022-02-27 DIAGNOSIS — I4891 Unspecified atrial fibrillation: Secondary | ICD-10-CM

## 2022-02-27 DIAGNOSIS — I252 Old myocardial infarction: Secondary | ICD-10-CM | POA: Insufficient documentation

## 2022-02-27 DIAGNOSIS — I5022 Chronic systolic (congestive) heart failure: Secondary | ICD-10-CM | POA: Diagnosis not present

## 2022-02-27 LAB — COMPREHENSIVE METABOLIC PANEL
ALT: 24 U/L (ref 0–44)
AST: 26 U/L (ref 15–41)
Albumin: 3.9 g/dL (ref 3.5–5.0)
Alkaline Phosphatase: 45 U/L (ref 38–126)
Anion gap: 9 (ref 5–15)
BUN: 17 mg/dL (ref 8–23)
CO2: 24 mmol/L (ref 22–32)
Calcium: 9.4 mg/dL (ref 8.9–10.3)
Chloride: 108 mmol/L (ref 98–111)
Creatinine, Ser: 2.05 mg/dL — ABNORMAL HIGH (ref 0.61–1.24)
GFR, Estimated: 33 mL/min — ABNORMAL LOW (ref >60)
Glucose, Bld: 85 mg/dL (ref 70–99)
Potassium: 4.3 mmol/L (ref 3.5–5.1)
Sodium: 141 mmol/L (ref 135–145)
Total Bilirubin: 1 mg/dL (ref 0.3–1.2)
Total Protein: 7.5 g/dL (ref 6.5–8.1)

## 2022-02-27 LAB — TSH: TSH: 0.087 u[IU]/mL — ABNORMAL LOW (ref 0.350–4.500)

## 2022-02-27 LAB — DIGOXIN LEVEL: Digoxin Level: 1.7 ng/mL (ref 0.8–2.0)

## 2022-02-27 LAB — BRAIN NATRIURETIC PEPTIDE: B Natriuretic Peptide: 1146.6 pg/mL — ABNORMAL HIGH (ref 0.0–100.0)

## 2022-02-27 MED ORDER — SPIRONOLACTONE 25 MG PO TABS: 12.5000 mg | ORAL_TABLET | Freq: Every day | ORAL | 3 refills | Status: DC

## 2022-02-27 NOTE — Progress Notes (Signed)
PCP: Neale Burly, MD ?HF Cardiology: Dr. Aundra Dubin ? ?76 y.o. with history of CAD, CKD stage 3, persistent atrial fibrillation, and probably primarily nonischemic cardiomyopathy.  He had PCI to LCx in 2000 and again in 9/14. Echo in 2020 showed EF 50% with normal RV.   ?  ?He was noted to be in atrial fibrillation starting in 9/22.  ?  ?Admitted 2/23 w/ acute HF. He was in atrial fibrillation, but V-rates noted to be decently controlled and was not cardioverted. Echo showed severely reduced LVEF,  LV severely dilated, EF <20% w/ global HK, RV mildy enlarged w/ mildly reduced systolic function. No LVH. Only mild MR and trivial TR. Had subsequent R/LHC which showed only mod nonobstructive CAD w/ widely patent, previously placed LCx stents. RCA dominant w/  70% stenosis in mid portion and 65% dRCA stenosis. Systolic dysfunction out of proportion to degree of CAD. RHC showed low filling pressures, mRAP 1, mPAP 15, mPCWP 11 and preserved cardiac output, Fick CO 4.38 L/min, CI 2.59 L/min. He was placed on GDMT but no loop diuretic. LifeVest was also placed for low EF and frequent NSVT. ?  ?Unclear etiology of low EF.  Posible viral cardiomyopathy as he had suspected PNA 3-4 weeks prior to HF admission in 2/23. Also ?familial given FH. 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 No SGLTi due to recurrent UTI.  ?  ?Patient was admitted in 3/23 with lightheadedness and weakness.  The day prior to admission, he had a worse episode of dizziness with palpitations and extreme weakness while doing weed-eating.  Few seconds later he had a noise (beeping) from LifeVest.  He sat down but symptoms persisted and had 2 more episodes of noise from LifeVest. Denied chest pain, shortness of breath or syncope.  He went to [facility] where he was in atrial fibrillation with rapid ventricular rate of 130s.  Placed on IV amiodarone, transferred to [facility].  Also of note, creatinine was up to 3.32.  PICC was placed, patient was  noted to have low co-ox and started on milrinone.  He was diuresed.  He ultimately had TEE-guided DCCV back to NSR.  TEE showed EF 20%, moderate LV dilation, mildly decreased RV systolic function. Cardiac MRI was done, showing severely dilated LV with diffuse hypokinesis EF 24%, normal RV size with mildly decreased systolic function EF 65%; delayed enhancement images show coronary-pattern LGE in a circumflex coronary artery distribution, suspect prior MI involving the LCx. Creatinine trended down, milrinone was stopped.   ? ?Patient returns for followup of CHF.  He has been feeling good since discharge.  No further lightheadedness.  He has not been taking a loop diuretic.  No dyspnea walking on flat ground or walking up stairs.  Says he feels the best he has felt in a long time.  No chest pain. No orthopnea/PND.  He is wearing Lifevest.  ? ?ECG (personally reviewed): NSR, LVH with repolarization abnormality.  ? ?Labs (3/23): K 4.2, creatinine 2.83 => 1.9 ? ?Past Medical History: ?1. CAD: PCI to LCx 2000, PCI LCx 9/14.   ?- LHC (2/23): 70% mRCA, 40% mLAD.  No intervention.  ?2. Cardiomyopathy: Suspect predominantly nonischemic.  Echo in 2020 with EF 50%.  Echo in 2/23 with EF < 20%, moderate-severe LV dilation, mild RVE with mildly decreased RV systolic function.   ?- RHC (2/23): mean RA 1, PA 24/10, mean PCWP 11, CI 2.59 ?- TEE (3/23) showed EF 20%, moderate LV dilation, mildly decreased RV systolic  function.  ?- Cardiac MRI (3/23) showed severely dilated LV with diffuse hypokinesis EF 24%, normal RV size with mildly decreased systolic function EF 17%; delayed enhancement images show coronary-pattern LGE in a circumflex coronary artery distribution, suspect prior MI involving the LCx.  ?3. CKD stage 3 ?4. H/o UTIs ?5. Atrial fibrillation: Persistent, noted in 9/22.  ?- TEE-guided DCCV in 3/23.  ?6. H/o Graves disease with hyperthyroidism s/p iodine thyroid ablation.  ?7. HTN.  ?8. Prior smoker ?9. NSVT ?10.  Thrombocytopenia: Mild, chronic.  ? ?Social History  ? ?Socioeconomic History  ? Marital status: Married  ?  Spouse name: [name]  ? Number of children: Not on file  ? Years of education: 11th grade  ? Highest education level: 11th grade  ?Occupational History  ? Occupation: Librarian, academic  ?Tobacco Use  ? Smoking status: Every Day  ?  Packs/day: 1.00  ?  Years: 53.00  ?  Pack years: 53.00  ?  Types: Cigarettes  ?  Start date: 12/09/1965  ? Smokeless tobacco: Never  ? Tobacco comments:  ?  3/4 pack of cigarettes daily; smoked since 76 years old  ?Vaping Use  ? Vaping Use: Never used  ?Substance and Sexual Activity  ? Alcohol use: No  ?  Alcohol/week: 0.0 standard drinks  ?  Comment: No bowel since year 2000.  Some previous heavy use.  ? Drug use: No  ? Sexual activity: Yes  ?  Birth control/protection: None  ?Other Topics Concern  ? Not on file  ?Social History Narrative  ? Pt gets regular exercise.Married for 22 years.Retired but still Dentist.  ? ?Social Determinants of Health  ? ?Financial Resource Strain: Low Risk   ? Difficulty of Paying Living Expenses: Not very hard  ?Food Insecurity: No Food Insecurity  ? Worried About Charity fundraiser in the Last Year: Never true  ? Ran Out of Food in the Last Year: Never true  ?Transportation Needs: No Transportation Needs  ? Lack of Transportation (Medical): No  ? Lack of Transportation (Non-Medical): No  ?Physical Activity: Not on file  ?Stress: Not on file  ?Social Connections: Not on file  ?Intimate Partner Violence: Not on file  ? ?Family History  ?Problem Relation Age of Onset  ? Early death Mother   ?     childbirth  ? Heart attack Father   ? Cancer Father   ? Heart disease Brother   ?     CABG  ? Heart disease Brother   ?     slight heart attack  ? Anesthesia problems Neg Hx   ? Hypotension Neg Hx   ? Malignant hyperthermia Neg Hx   ? Pseudochol deficiency Neg Hx   ? Colon cancer Neg Hx   ? Liver disease Neg Hx   ? Gastric cancer Neg Hx   ?  Esophageal cancer Neg Hx   ? ?ROS: All systems reviewed and negative except as per HPI.  ? ?Current Outpatient Medications  ?Medication Sig Dispense Refill  ? amiodarone (PACERONE) 200 MG tablet Take 2 tablets (400 mg) twice a day for 7 days. Then take 1 tablet  (200 mg) twice a day for 7 days. Then take 1 tablet  (200 mg) daily. 60 tablet 6  ? apixaban (ELIQUIS) 5 MG TABS tablet TAKE 1 TABLET(5 MG) BY MOUTH TWICE DAILY 180 tablet 1  ? atorvastatin (LIPITOR) 80 MG tablet Take 1 tablet (80 mg total) by mouth daily. 90 tablet 3  ?  Cholecalciferol (VITAMIN D-3) 125 MCG (5000 UT) TABS Take 1 tablet by mouth daily.    ? digoxin (LANOXIN) 0.125 MG tablet Take 1 tablet (0.125 mg total) by mouth daily. 30 tablet 6  ? nitroGLYCERIN (NITROSTAT) 0.4 MG SL tablet Place 1 tablet (0.4 mg total) under the tongue every 5 (five) minutes as needed for chest pain. 30 tablet 3  ? pantoprazole (PROTONIX) 40 MG tablet Take 1 tablet (40 mg total) by mouth daily. 90 tablet 3  ? sacubitril-valsartan (ENTRESTO) 24-26 MG Take 1 tablet by mouth 2 (two) times daily. 60 tablet 6  ? spironolactone (ALDACTONE) 25 MG tablet Take 0.5 tablets (12.5 mg total) by mouth daily. 15 tablet 3  ? ?No current facility-administered medications for this encounter.  ? ?BP (!) 160/80   Pulse (!) 56   Wt 65.8 kg   SpO2 98%   BMI 24.13 kg/m?  ?General: NAD ?Neck: No JVD, no thyromegaly or thyroid nodule.  ?Lungs: Clear to auscultation bilaterally with normal respiratory effort. ?CV: Nondisplaced PMI.  Heart regular S1/S2, no S3/S4, no murmur.  No peripheral edema.  No carotid bruit.  Normal pedal pulses.  ?Abdomen: Soft, nontender, no hepatosplenomegaly, no distention.  ?Skin: Intact without lesions or rashes.  ?Neurologic: Alert and oriented x 3.  ?Psych: Normal affect. ?Extremities: No clubbing or cyanosis.  ?HEENT: Normal.  ? ?Assessment/Plan: ?1. Chronic systolic CHF: Suspect primarily nonischemic cardiomyopathy.  Echo in 2020 with EF 50%.  Echo in 2/23  with EF < 20%, moderate-severe LV dilation, mild RVE with mildly decreased RV systolic function.  The drop does not appear to be due to CAD, cath in 2/23 with nonobstructive disease.  Possible familial CMP, father'

## 2022-02-27 NOTE — Patient Instructions (Signed)
Medication Changes: ? ?Start Spironolactone 12.5 mg (1/2 tab) Daily ? ?Lab Work: ? ?Labs done today, we will call you for abnormal results ? ?Your physician recommends that you return for lab work in: we have provided you with a prescription to have this done locally ? ?Testing/Procedures: ? ?None ? ?Referrals: ? ?You have been referred to [facility] for a-fib and possible ablation ? ?Special Instructions // Education: ? ?Do the following things EVERYDAY: ?Weigh yourself in the morning before breakfast. Write it down and keep it in a log. ?Take your medicines as prescribed ?Eat low salt foods--Limit salt (sodium) to 2000 mg per day.  ?Stay as active as you can everyday ?Limit all fluids for the day to less than 2 liters ? ? ?Follow-Up in:  ? ?Please follow up with our heart failure pharmacist in 3-4 weeks and our Advanced Practice Providers in 6-8 weeks ? ?At the [facility], you and your health needs are our priority. We have a designated team specialized in the treatment of Heart Failure. This Care Team includes your primary Heart Failure Specialized Cardiologist (physician), Advanced Practice Providers (APPs- Physician Assistants and Nurse Practitioners), and Pharmacist who all work together to provide you with the care you need, when you need it.  ? ?You may see any of the following providers on your designated Care Team at your next follow up: ? ?Dr Glori Bickers ?Dr Loralie Champagne ?Darrick Grinder, NP ?Lyda Jester, PA ?Jessica Milford,NP ?Marlyce Huge, PA ?Audry Riles, PharmD ? ? ?Please be sure to bring in all your medications bottles to every appointment.  ? ?Need to Contact us: ? ?If you have any questions or concerns before your next appointment please send [place] a message through [place] or call our office at [phone].   ? ?TO LEAVE A MESSAGE FOR THE NURSE SELECT OPTION 2, PLEASE LEAVE A MESSAGE INCLUDING: ?YOUR NAME ?DATE OF BIRTH ?CALL BACK NUMBER ?REASON FOR CALL**this is  important as we prioritize the call backs ? ?YOU WILL RECEIVE A CALL BACK THE SAME DAY AS LONG AS YOU CALL BEFORE 4:00 PM ? ? ?

## 2022-02-28 ENCOUNTER — Telehealth (HOSPITAL_COMMUNITY): Payer: Self-pay | Admitting: Licensed Clinical Social Worker

## 2022-02-28 ENCOUNTER — Telehealth (HOSPITAL_COMMUNITY): Payer: Self-pay | Admitting: Cardiology

## 2022-02-28 DIAGNOSIS — I5042 Chronic combined systolic (congestive) and diastolic (congestive) heart failure: Secondary | ICD-10-CM

## 2022-02-28 NOTE — Telephone Encounter (Signed)
Pt wife called CSW and requested help sending documentation to [employer] for pt as they were told yesterday during MD appt that he will need to be out of work another 3 months. ? ?CSW found release on file for [place] to send documentation so CSW sent note from yesterday and sent note to clinic staff to help with letter stating he is being kept out of work for 3 more months so that can be sent in as well. ? ?Jorge Ny, LCSW ?Clinical Social Worker ?Advanced Heart Failure Clinic ?Desk#: [phone] ?Cell#: [phone] ? ?

## 2022-02-28 NOTE — Telephone Encounter (Signed)
Referral placed.

## 2022-02-28 NOTE — Telephone Encounter (Signed)
-----   Message from Larey Dresser, MD sent at 02/28/2022  1:04 PM EDT ----- ?His TSH is low despite iodine thyroid ablation in the past, suggests residual hyperthyroidism. He is also on amiodarone. He needs followup with endocrinology.  Please set him up asap with [facility] endocrinology. Resend TSH with free T3 and free T4.  ?

## 2022-03-01 ENCOUNTER — Other Ambulatory Visit (HOSPITAL_COMMUNITY): Payer: Self-pay | Admitting: *Deleted

## 2022-03-01 MED ORDER — SPIRONOLACTONE 25 MG PO TABS: 12.5000 mg | ORAL_TABLET | Freq: Every day | ORAL | 3 refills | Status: DC

## 2022-03-07 DIAGNOSIS — I5022 Chronic systolic (congestive) heart failure: Secondary | ICD-10-CM | POA: Diagnosis not present

## 2022-03-08 ENCOUNTER — Encounter (HOSPITAL_COMMUNITY): Payer: Self-pay | Admitting: *Deleted

## 2022-03-08 NOTE — Telephone Encounter (Signed)
Letter completed and faxed.

## 2022-03-12 NOTE — Progress Notes (Incomplete)
***In Progress*** ? ?  ?Advanced Heart Failure Clinic Note  ? ?PCP: Neale Burly, MD ?HF Cardiology: Dr. Aundra Dubin ?  ?HPI:  ?76 y.o. with history of CAD, CKD stage 3, persistent atrial fibrillation, and probably primarily nonischemic cardiomyopathy.  He had PCI to LCx in 2000 and again in 9/14. Echo in 2020 showed EF 50% with normal RV.   ?  ?He was noted to be in atrial fibrillation starting in 07/2021.  ?  ?Admitted 12/2021 w/ acute HF. He was in atrial fibrillation, but V-rates noted to be decently controlled and was not cardioverted. Echo showed severely reduced LVEF,  LV severely dilated, EF <20% w/ global HK, RV mildy enlarged w/ mildly reduced systolic function. No LVH. Only mild MR and trivial TR. Had subsequent R/LHC which showed only mod nonobstructive CAD w/ widely patent, previously placed LCx stents. RCA dominant w/ 70% stenosis in mid portion and 65% dRCA stenosis. Systolic dysfunction out of proportion to degree of CAD. RHC showed low filling pressures, mRAP 1, mPAP 15, mPCWP 11 and preserved cardiac output, Fick CO 4.38 L/min, CI 2.59 L/min. He was placed on GDMT but no loop diuretic. LifeVest was also placed for low EF and frequent NSVT. ?  ?Unclear etiology of low EF.  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 Also ?familial given FH. 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 No SGLTi due to recurrent UTI.  ?  ?Patient was admitted in 01/2022 with lightheadedness and weakness.  The day prior to admission, he had a worse episode of dizziness with palpitations and extreme weakness while doing weed-eating.  Few seconds later he had a noise (beeping) from LifeVest.  He sat down but symptoms persisted and had 2 more episodes of noise from LifeVest. Denied chest pain, shortness of breath or syncope.  He went to [facility] where he was in atrial fibrillation with rapid ventricular rate of 130s.  Placed on IV amiodarone, transferred  to [facility].  Also of note, creatinine was up to 3.32.  PICC was placed, patient was noted to have low co-ox and started on milrinone.  He was diuresed.  He ultimately had TEE-guided DCCV back to NSR.  TEE showed EF 20%, moderate LV dilation, mildly decreased RV systolic function. Cardiac MRI was done, showing severely dilated LV with diffuse hypokinesis EF 24%, normal RV size with mildly decreased systolic function EF 08%; delayed enhancement images show coronary-pattern LGE in a circumflex coronary artery distribution, suspect prior MI involving the LCx. Creatinine trended down, milrinone was stopped.   ?  ?Patient returned to [facility] for followup of CHF 02/27/22.  He had been feeling good since discharge.  No further lightheadedness.  He had not been taking a loop diuretic.  No dyspnea walking on flat ground or walking up stairs.  Stated he felt the best he had felt in a long time.  No chest pain. No orthopnea/PND.  He was wearing his LifeVest.  ?  ? ?Today he returns to HF clinic for pharmacist medication titration. At last visit with MD spironolactone 12.5 mg daily was initiated. Additionally, he was referred to endocrinology as labs collected on 02/26/22 suggested hyperthyroidism.  ? ?Shortness of breath/dyspnea on exertion? {YES NO:22349}  ?Orthopnea/PND? {YES NO:22349} ?Edema? {YES NO:22349} ?Lightheadedness/dizziness? {YES NO:22349} ?Daily weights at home? {YES NO:22349} ?Blood pressure/heart rate monitoring at home? {YES NO:22349} ?Following low-sodium/fluid-restricted diet? {YES NO:22349} ? ?HF Medications: ?Entresto 24/26 mg BID ?Spironolactone 12.5  mg daily ?Digoxin 0.125 mg daily ? ?Has the patient been experiencing any side effects to the medications prescribed?  {YES NO:22349} ? ?Does the patient have any problems obtaining medications due to transportation or finances?   {YES NO:22349} ? ?Understanding of regimen: {excellent/good/fair/poor:19665} ?Understanding of indications:  {excellent/good/fair/poor:19665} ?Potential of compliance: {excellent/good/fair/poor:19665} ?Patient understands to avoid NSAIDs. ?Patient understands to avoid decongestants. ?  ? ?Pertinent Lab Values: ?Serum creatinine ***, BUN ***, Potassium ***, Sodium ***, BNP ***, Magnesium ***, Digoxin ***  ? ?Vital Signs: ?Weight: *** (last clinic weight: ***) ?Blood pressure: ***  ?Heart rate: ***  ? ?Assessment/Plan: ?1. Chronic systolic CHF: Suspect primarily nonischemic cardiomyopathy.  Echo in 2020 with EF 50%.  Echo in 12/2021 with EF < 20%, moderate-severe LV dilation, mild RVE with mildly decreased RV systolic function.  The drop does not appear to be due to CAD, cath in 12/2021 with nonobstructive disease.  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  Possible viral myocarditis, possible viral PNA earlier this year.  Also certainly possible this could be a tachy-mediated CMP given atrial fibrillation with RVR noted at admission in 01/2022.  TEE showed EF 20%, moderate LV dilation, mildly decreased RV systolic function. Cardiac MRI was done, showing severely dilated LV with diffuse hypokinesis EF 24%, normal RV size with mildly decreased systolic function EF 16%; delayed enhancement images show coronary-pattern LGE in a circumflex coronary artery distribution, suspect prior MI involving the LCx, though do not think this can explain the extent of the cardiomyopathy.   ?- NYHA class II symptoms.  Not volume overloaded on exam.  ?- He does not appear to need a diuretic.  ?- Continue Entresto 24/26 mg BID ?- Continue spironolactone 12.5 mg daily ?- Continue digoxin 0.125 mg daily.  ?- Hold beta blocker with bradycardia in setting of amiodarone use.   ?- No SGLT2 inhibitor for now with h/o UTIs.  ?- Continue Lifevest for now, echo in 3 months to determine need for ICD.  ?2. Atrial fibrillation: TEE/DCCV to NSR in 01/2022.  Possible component of tachycardia-mediated cardiomyopathy. ?- Continue  amiodarone 200 mg daily. Referred to endocrinology for concern for hyperthyroidism on recent labs.  ?- Continue Eliquis.  ?-Previously referred to EP for consideration of atrial fibrillation ablation given possible component of tachycardia-mediated CMP.   ?3. NSVT: Has been wearing Lifevest at home.   ?- On amiodarone.  ?4. CKD stage 3: BMET today. *** ?5. CAD: PCI to LCx remotely.  LHC in 12/2021 with nonobstructive CAD.  Cannot explain CMP.  ?- No ASA given Eliquis use.  ?- Continue atorvastatin.  ?6. Hypocalcemia: PTH was high when checked, ?due to renal disease.   ? ?Follow up *** ? ? ?Audry Riles, PharmD, BCPS, BCCP, CPP ?Heart Failure Clinic Pharmacist ?[phone] ?  ?

## 2022-03-15 ENCOUNTER — Other Ambulatory Visit (HOSPITAL_COMMUNITY): Payer: Self-pay

## 2022-03-17 DIAGNOSIS — I420 Dilated cardiomyopathy: Secondary | ICD-10-CM | POA: Diagnosis not present

## 2022-03-17 DIAGNOSIS — I252 Old myocardial infarction: Secondary | ICD-10-CM | POA: Diagnosis not present

## 2022-03-21 NOTE — Progress Notes (Incomplete)
***In Progress*** ? ?  ?Advanced Heart Failure Clinic Note  ? ?PCP: Neale Burly, MD ?HF Cardiology: Dr. Aundra Dubin ?  ?HPI:  ?76 y.o. with history of CAD, CKD stage 3, persistent atrial fibrillation, and probably primarily nonischemic cardiomyopathy.  He had PCI to LCx in 2000 and again in 9/14. Echo in 2020 showed EF 50% with normal RV.   ?  ?He was noted to be in atrial fibrillation starting in 07/2021.  ?  ?Admitted 12/2021 w/ acute HF. He was in atrial fibrillation, but V-rates noted to be decently controlled and was not cardioverted. Echo showed severely reduced LVEF,  LV severely dilated, EF <20% w/ global HK, RV mildy enlarged w/ mildly reduced systolic function. No LVH. Only mild MR and trivial TR. Had subsequent R/LHC which showed only mod nonobstructive CAD w/ widely patent, previously placed LCx stents. RCA dominant w/ 70% stenosis in mid portion and 65% dRCA stenosis. Systolic dysfunction out of proportion to degree of CAD. RHC showed low filling pressures, mRAP 1, mPAP 15, mPCWP 11 and preserved cardiac output, Fick CO 4.38 L/min, CI 2.59 L/min. He was placed on GDMT but no loop diuretic. LifeVest was also placed for low EF and frequent NSVT. ?  ?Unclear etiology of low EF.  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 Also ?familial given FH. 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 No SGLTi due to recurrent UTI.  ?  ?Patient was admitted in 01/2022 with lightheadedness and weakness.  The day prior to admission, he had a worse episode of dizziness with palpitations and extreme weakness while doing weed-eating.  Few seconds later he had a noise (beeping) from LifeVest.  He sat down but symptoms persisted and had 2 more episodes of noise from LifeVest. Denied chest pain, shortness of breath or syncope.  He went to [facility] where he was in atrial fibrillation with rapid ventricular rate of 130s.  Placed on IV amiodarone, transferred  to [facility].  Also of note, creatinine was up to 3.32.  PICC was placed, patient was noted to have low co-ox and started on milrinone.  He was diuresed.  He ultimately had TEE-guided DCCV back to NSR.  TEE showed EF 20%, moderate LV dilation, mildly decreased RV systolic function. Cardiac MRI was done, showing severely dilated LV with diffuse hypokinesis EF 24%, normal RV size with mildly decreased systolic function EF 25%; delayed enhancement images show coronary-pattern LGE in a circumflex coronary artery distribution, suspect prior MI involving the LCx. Creatinine trended down, milrinone was stopped.   ?  ?Patient returned to [facility] for followup of CHF 02/27/22.  He had been feeling good since discharge.  No further lightheadedness.  He had not been taking a loop diuretic.  No dyspnea walking on flat ground or walking up stairs.  Stated he felt the best he had felt in a long time.  No chest pain. No orthopnea/PND.  He was wearing his LifeVest.  ?  ? ?Today he returns to HF clinic for pharmacist medication titration. At last visit with MD spironolactone 12.5 mg daily was initiated. Additionally, he was referred to endocrinology as labs collected on 02/26/22 suggested hyperthyroidism.  ? ?Shortness of breath/dyspnea on exertion? {YES NO:22349}  ?Orthopnea/PND? {YES NO:22349} ?Edema? {YES NO:22349} ?Lightheadedness/dizziness? {YES NO:22349} ?Daily weights at home? {YES NO:22349} ?Blood pressure/heart rate monitoring at home? {YES NO:22349} ?Following low-sodium/fluid-restricted diet? {YES NO:22349} ? ?HF Medications: ?Entresto 24/26 mg BID ?Spironolactone 12.5  mg daily ?Digoxin 0.125 mg daily ? ?Has the patient been experiencing any side effects to the medications prescribed?  {YES NO:22349} ? ?Does the patient have any problems obtaining medications due to transportation or finances?   {YES NO:22349} ? ?Understanding of regimen: {excellent/good/fair/poor:19665} ?Understanding of indications:  {excellent/good/fair/poor:19665} ?Potential of compliance: {excellent/good/fair/poor:19665} ?Patient understands to avoid NSAIDs. ?Patient understands to avoid decongestants. ?  ? ?Pertinent Lab Values: ?Serum creatinine ***, BUN ***, Potassium ***, Sodium ***, BNP ***, Magnesium ***, Digoxin ***  ? ?Vital Signs: ?Weight: *** (last clinic weight: ***) ?Blood pressure: ***  ?Heart rate: ***  ? ?Assessment/Plan: ?1. Chronic systolic CHF: Suspect primarily nonischemic cardiomyopathy.  Echo in 2020 with EF 50%.  Echo in 12/2021 with EF < 20%, moderate-severe LV dilation, mild RVE with mildly decreased RV systolic function.  The drop does not appear to be due to CAD, cath in 12/2021 with nonobstructive disease.  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  Possible viral myocarditis, possible viral PNA earlier this year.  Also certainly possible this could be a tachy-mediated CMP given atrial fibrillation with RVR noted at admission in 01/2022.  TEE showed EF 20%, moderate LV dilation, mildly decreased RV systolic function. Cardiac MRI was done, showing severely dilated LV with diffuse hypokinesis EF 24%, normal RV size with mildly decreased systolic function EF 78%; delayed enhancement images show coronary-pattern LGE in a circumflex coronary artery distribution, suspect prior MI involving the LCx, though do not think this can explain the extent of the cardiomyopathy.   ?- NYHA class II symptoms.  Not volume overloaded on exam.  ?- He does not appear to need a diuretic.  ?- Continue Entresto 24/26 mg BID ?- Continue spironolactone 12.5 mg daily ?- Continue digoxin 0.125 mg daily.  ?- Hold beta blocker with bradycardia in setting of amiodarone use.   ?- No SGLT2 inhibitor for now with h/o UTIs.  ?- Continue Lifevest for now, echo in 3 months to determine need for ICD.  ?2. Atrial fibrillation: TEE/DCCV to NSR in 01/2022.  Possible component of tachycardia-mediated cardiomyopathy. ?- Continue  amiodarone 200 mg daily. Referred to endocrinology for concern for hyperthyroidism on recent labs.  ?- Continue Eliquis.  ?-Previously referred to EP for consideration of atrial fibrillation ablation given possible component of tachycardia-mediated CMP.   ?3. NSVT: Has been wearing Lifevest at home.   ?- On amiodarone.  ?4. CKD stage 3: BMET today. *** ?5. CAD: PCI to LCx remotely.  LHC in 12/2021 with nonobstructive CAD.  Cannot explain CMP.  ?- No ASA given Eliquis use.  ?- Continue atorvastatin.  ?6. Hypocalcemia: PTH was high when checked, ?due to renal disease.   ? ?Follow up *** ? ? ?Audry Riles, PharmD, BCPS, BCCP, CPP ?Heart Failure Clinic Pharmacist ?[phone] ?

## 2022-03-26 ENCOUNTER — Telehealth (HOSPITAL_COMMUNITY): Payer: Self-pay | Admitting: *Deleted

## 2022-03-26 NOTE — Telephone Encounter (Signed)
Pts wife called stating records and paperwork for short term disability need to be faxed to [phone]. ? ? ?Routed to [employer]  ?

## 2022-03-27 ENCOUNTER — Inpatient Hospital Stay (HOSPITAL_COMMUNITY): Admission: RE | Admit: 2022-03-27 | Payer: Medicare HMO | Source: Ambulatory Visit

## 2022-03-28 NOTE — Telephone Encounter (Signed)
Forms, completed, signed by Dr Aundra Dubin, and faxed into [employer]  ? ?Pt's wife aware this was done, copy mailed to pt  ?

## 2022-04-04 ENCOUNTER — Ambulatory Visit (HOSPITAL_COMMUNITY)
Admission: RE | Admit: 2022-04-04 | Discharge: 2022-04-04 | Disposition: A | Discharge status: Home / Self Care | Payer: Medicare HMO | Source: Ambulatory Visit | Attending: Internal Medicine | Admitting: Internal Medicine

## 2022-04-04 ENCOUNTER — Encounter (HOSPITAL_COMMUNITY): Payer: Self-pay

## 2022-04-04 VITALS — BP 162/98 | HR 52 | Ht 65.0 in | Wt 141.2 lb

## 2022-04-04 DIAGNOSIS — R001 Bradycardia, unspecified: Secondary | ICD-10-CM | POA: Diagnosis not present

## 2022-04-04 DIAGNOSIS — I2510 Atherosclerotic heart disease of native coronary artery without angina pectoris: Secondary | ICD-10-CM | POA: Diagnosis not present

## 2022-04-04 DIAGNOSIS — E0590 Thyrotoxicosis, unspecified without thyrotoxic crisis or storm: Secondary | ICD-10-CM | POA: Diagnosis not present

## 2022-04-04 DIAGNOSIS — Z7901 Long term (current) use of anticoagulants: Secondary | ICD-10-CM | POA: Insufficient documentation

## 2022-04-04 DIAGNOSIS — I130 Hypertensive heart and chronic kidney disease with heart failure and stage 1 through stage 4 chronic kidney disease, or unspecified chronic kidney disease: Secondary | ICD-10-CM | POA: Insufficient documentation

## 2022-04-04 DIAGNOSIS — I5022 Chronic systolic (congestive) heart failure: Secondary | ICD-10-CM | POA: Diagnosis not present

## 2022-04-04 DIAGNOSIS — I428 Other cardiomyopathies: Secondary | ICD-10-CM | POA: Insufficient documentation

## 2022-04-04 DIAGNOSIS — N183 Chronic kidney disease, stage 3 unspecified: Secondary | ICD-10-CM | POA: Diagnosis not present

## 2022-04-04 DIAGNOSIS — I5042 Chronic combined systolic (congestive) and diastolic (congestive) heart failure: Secondary | ICD-10-CM

## 2022-04-04 DIAGNOSIS — I4891 Unspecified atrial fibrillation: Secondary | ICD-10-CM | POA: Diagnosis not present

## 2022-04-04 LAB — BASIC METABOLIC PANEL
Anion gap: 7 (ref 5–15)
BUN: 17 mg/dL (ref 8–23)
CO2: 25 mmol/L (ref 22–32)
Calcium: 8.8 mg/dL — ABNORMAL LOW (ref 8.9–10.3)
Chloride: 108 mmol/L (ref 98–111)
Creatinine, Ser: 1.95 mg/dL — ABNORMAL HIGH (ref 0.61–1.24)
GFR, Estimated: 35 mL/min — ABNORMAL LOW (ref >60)
Glucose, Bld: 86 mg/dL (ref 70–99)
Potassium: 3.3 mmol/L — ABNORMAL LOW (ref 3.5–5.1)
Sodium: 140 mmol/L (ref 135–145)

## 2022-04-04 MED ORDER — POTASSIUM CHLORIDE CRYS ER 10 MEQ PO TBCR: 10.0000 meq | EXTENDED_RELEASE_TABLET | Freq: Every day | ORAL | 0 refills | Status: DC

## 2022-04-04 MED ORDER — SPIRONOLACTONE 25 MG PO TABS: 25.0000 mg | ORAL_TABLET | Freq: Every day | ORAL | 3 refills | Status: DC

## 2022-04-04 MED ORDER — POTASSIUM CHLORIDE CRYS ER 10 MEQ PO TBCR
10.0000 meq | EXTENDED_RELEASE_TABLET | Freq: Every day | ORAL | Status: DC
  Filled 2022-04-04: qty 1

## 2022-04-04 NOTE — Patient Instructions (Addendum)
It was a pleasure seeing you today! ? ?MEDICATIONS: ?-Increase spironolactone to 1 tablet (25 mg) daily ?-Please drink more water before your next visit in clinic.  ?-Do not take the digoxin dose the morning of your next appointment. We will be drawing labs to see what the level is.  ?- Start taking your blood pressure and write down the readings. ?-Call if you have questions about your medications. ? ?LABS: ?-We will call you if your labs need attention. ? ?NEXT APPOINTMENT: ?Return to clinic in one week on May 18th at 1pm with the Pharmacist. ? ?In general, to take care of your heart failure: ?-Limit your fluid intake to 2 Liters (half-gallon) per day.   ?-Limit your salt intake to ideally 2-3 grams (2000-3000 mg) per day. ?-Weigh yourself daily and record, and bring that "weight diary" to your next appointment.  (Weight gain of 2-3 pounds in 1 day typically means fluid weight.) ?-The medications for your heart are to help your heart and help you live longer.   ?-Please contact us before stopping any of your heart medications. ? ?Call the clinic at [phone] with questions or to reschedule future appointments.  ?

## 2022-04-04 NOTE — Progress Notes (Signed)
?Advanced Heart Failure Clinic Note  ? ?PCP: Neale Burly, MD ?HF Cardiology: Dr. Aundra Dubin ?  ?HPI:  ?76 y.o. with history of CAD, CKD stage 3, persistent atrial fibrillation, and probably primarily nonischemic cardiomyopathy.  He had PCI to LCx in 2000 and again in 9/14. Echo in 2020 showed EF 50% with normal RV.   ?  ?He was noted to be in atrial fibrillation starting in 07/2021.  ?  ?Admitted 12/2021 w/ acute HF.  He was in atrial fibrillation, but V-rates noted to be decently controlled and was not cardioverted.  Echo showed severely reduced LVEF,  LV severely dilated, EF <20% w/ global HK, RV mildy enlarged w/ mildly reduced systolic function.  No LVH. Only mild MR and trivial TR.  Had subsequent R/LHC which showed only mod nonobstructive CAD w/ widely patent, previously placed LCx stents.  RCA dominant w/ 70% stenosis in mid portion and 65% dRCA stenosis.  Systolic dysfunction out of proportion to degree of CAD.  RHC showed low filling pressures, mRAP 1, mPAP 15, mPCWP 11 and preserved cardiac output, Fick CO 4.38 L/min, CI 2.59 L/min.  He was placed on GDMT but no loop diuretic.  LifeVest was also placed for low EF and frequent NSVT. ?  ?Unclear etiology of low EF.  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  Also ?familial given FH.  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  No SGLTi due to recurrent UTI.  ?  ?Patient was admitted in 01/2022 with lightheadedness and weakness.  The day prior to admission, he had a worse episode of dizziness with palpitations and extreme weakness while doing weed-eating.  Few seconds later he had a noise (beeping) from LifeVest.  He sat down but symptoms persisted and had 2 more episodes of noise from LifeVest.  Denied chest pain, shortness of breath or syncope.  He went to [facility] where he was in atrial fibrillation with rapid ventricular rate of 130s.  Placed on IV amiodarone, transferred to  [facility].  Also of note, creatinine was up to 3.32.  PICC was placed, patient was noted to have low co-ox and started on milrinone.  He was diuresed.  He ultimately had TEE-guided DCCV back to NSR.  TEE showed EF 20%, moderate LV dilation, mildly decreased RV systolic function.  Cardiac MRI was done, showing severely dilated LV with diffuse hypokinesis EF 24%, normal RV size with mildly decreased systolic function EF 40%; delayed enhancement images show coronary-pattern LGE in a circumflex coronary artery distribution, suspect prior MI involving the LCx. Creatinine trended down, milrinone was stopped.   ?  ?Patient returned to [facility] for followup of CHF 02/27/22 with Dr. Aundra Dubin.  He had been feeling good since discharge.  No further lightheadedness.  He had not been taking a loop diuretic.  No dyspnea walking on flat ground or walking up stairs.  Stated he felt the best he had felt in a long time.  No chest pain. No orthopnea/PND.  He was wearing his LifeVest.  ?  ?Today he returns to HF clinic for pharmacist medication titration with his wife.  At last visit with MD, spironolactone 12.5 mg daily was initiated.   Additionally, he was referred to endocrinology as labs collected on 02/26/22 suggested hyperthyroidism.  Overall feeling okay.  Denies fatigue, chest pain and palpitations.  Reports that his dizzy spells have returned since starting spironolactone.  Reports that his dizziness went away after  March CHF hospitalization but has returned since starting spironolactone and is worse when he stands up. Appears dehydrated on exam. Endorses having one cup of water and a few sips of KoolAid/tea for the entire day.  His wife tries to get him to drink more but he says he's not thirsty. Had difficulty drawing labs today.  Adheres to a low salt diet.  No limitations with ADLs.  Continues to be active doing yard work and other house chores.  Weight at home fluctuates - anywhere from 135 to 143 lbs.  No LEE on exam. Doesn't  take a diuretic.  Denies PND/orthopnea.  Clinic BP 162/98 after taking all medications this morning. Doesn't take BP at home.  BMP today with low potassium (3.3) and stable renal function. Could not collect digoxin level today as he took it a couple hours prior to visit. ? ?HF Medications: ?Entresto 24/26 mg BID ?Spironolactone 12.5 mg daily ?Digoxin 0.125 mg daily ? ?Has the patient been experiencing any side effects to the medications prescribed?  Yes - dizziness after starting spironolactone ? ?Does the patient have any problems obtaining medications due to transportation or finances?   No ? ?Understanding of regimen: excellent ?Understanding of indications: fair ?Potential of compliance: good ?Patient understands to avoid NSAIDs. ?Patient understands to avoid decongestants. ?  ? ?Pertinent Lab Values: ?03/07/2022: Serum creatinine 2.03, BUN 15, Potassium 3.8, Digoxin 1.7 (02/27/22)  ?04/04/2022: Serum creatinine 1.95, BUN 17, Potassium 3.3, Sodium 140 ? ?Vital Signs: ?Weight: 141 lbs (last clinic weight: 145 lbs) ?Blood pressure: 162/98 ?Heart rate: 52 ? ?Assessment/Plan: ?1. Chronic systolic CHF: Suspect primarily nonischemic cardiomyopathy.  Echo in 2020 with EF 50%.  Echo in 12/2021 with EF < 20%, moderate-severe LV dilation, mild RVE with mildly decreased RV systolic function.  The drop does not appear to be due to CAD, cath in 12/2021 with nonobstructive disease.  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  Possible viral myocarditis, possible viral PNA earlier this year.  Also certainly possible this could be a tachy-mediated CMP given atrial fibrillation with RVR noted at admission in 01/2022.  TEE showed EF 20%, moderate LV dilation, mildly decreased RV systolic function. Cardiac MRI was done, showing severely dilated LV with diffuse hypokinesis EF 24%, normal RV size with mildly decreased systolic function EF 76%; delayed enhancement images show coronary-pattern LGE in a  circumflex coronary artery distribution, suspect prior MI involving the LCx, though do not think this can explain the extent of the cardiomyopathy.   ?- NYHA class II symptoms.  Not volume overloaded on exam. Appears dehydrated. ?- He does not appear to need a diuretic. Encouraged patient to start drinking more water (no more than 2L) to avoid dehydration. ?- Continue Entresto 24/26 mg BID ?- Increase spironolactone to 25 mg daily with elevated BP. Recheck BMET in 1 week.  ?- Continue digoxin 0.125 mg daily. Will get a digoxin level at next visit - advised patient to hold dose that morning. ?- Hold beta blocker with bradycardia in setting of amiodarone use.   ?- No SGLT2 inhibitor for now with h/o UTIs.  ?- Start potassium 10 mEq daily until PharmD visit in one week ?- Continue Lifevest for now, echo in 2 months to determine need for ICD.  ? ?2. Atrial fibrillation: TEE/DCCV to NSR in 01/2022.  Possible component of tachycardia-mediated cardiomyopathy. ?- Continue amiodarone 200 mg daily. Referred to endocrinology for concern for hyperthyroidism on recent labs.  ?- Continue Eliquis.  ?-  Previously referred to EP for consideration of atrial fibrillation ablation given possible component of tachycardia-mediated CMP.   ? ?3. NSVT: Has been wearing Lifevest at home.   ?- On amiodarone.  ? ?4. CKD stage 3: Scr 1.95 ? ?5. CAD: PCI to LCx remotely.  LHC in 12/2021 with nonobstructive CAD.  Cannot explain CMP.  ?- No ASA given Eliquis use.  ?- Continue atorvastatin.  ? ?6. Hypocalcemia: PTH was high when checked, ?due to renal disease.   ? ?Follow up one week for med titration and dehydration standpoint with PharmD. Two weeks (05/31) with APP in HF clinic. EP visit on 5/23. ? ? ?Laurey Arrow, PharmD ?PGY1 Pharmacy Resident ? ?Kerby Nora, PharmD, BCPS ?Advanced Heart Failure Clinic Pharmacist ?[phone] ? ?

## 2022-04-08 NOTE — Progress Notes (Signed)
Advanced Heart Failure Clinic Note   PCP: Neale Burly, MD HF Cardiology: Dr. Aundra Dubin   HPI:  76 y.o. with history of CAD, CKD stage 3, persistent atrial fibrillation, and probably primarily nonischemic cardiomyopathy.  He had PCI to LCx in 2000 and again in 9/14. Echo in 2020 showed EF 50% with normal RV.     He was noted to be in atrial fibrillation starting in 07/2021.    Admitted 12/2021 w/ acute HF.  He was in atrial fibrillation, but V-rates noted to be decently controlled and was not cardioverted.  Echo showed severely reduced LVEF,  LV severely dilated, EF <20% w/ global HK, RV mildy enlarged w/ mildly reduced systolic function.  No LVH. Only mild MR and trivial TR.  Had subsequent R/LHC which showed only mod nonobstructive CAD w/ widely patent, previously placed LCx stents.  RCA dominant w/ 70% stenosis in mid portion and 65% dRCA stenosis.  Systolic dysfunction out of proportion to degree of CAD.  RHC showed low filling pressures, mRAP 1, mPAP 15, mPCWP 11 and preserved cardiac output, Fick CO 4.38 L/min, CI 2.59 L/min.  He was placed on GDMT but no loop diuretic.  LifeVest was also placed for low EF and frequent NSVT.   Unclear etiology of low EF.  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  Also ?familial given FH.  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  No SGLTi due to recurrent UTI.    Patient was admitted in 01/2022 with lightheadedness and weakness.  The day prior to admission, he had a worse episode of dizziness with palpitations and extreme weakness while doing weed-eating.  Few seconds later he had a noise (beeping) from LifeVest.  He sat down but symptoms persisted and had 2 more episodes of noise from LifeVest.  Denied chest pain, shortness of breath or syncope.  He went to [facility] where he was in atrial fibrillation with rapid ventricular rate of 130s.  Placed on IV amiodarone, transferred to  [facility].  Also of note, creatinine was up to 3.32.  PICC was placed, patient was noted to have low co-ox and started on milrinone.  He was diuresed.  He ultimately had TEE-guided DCCV back to NSR.  TEE showed EF 20%, moderate LV dilation, mildly decreased RV systolic function.  Cardiac MRI was done, showing severely dilated LV with diffuse hypokinesis EF 24%, normal RV size with mildly decreased systolic function EF 37%; delayed enhancement images show coronary-pattern LGE in a circumflex coronary artery distribution, suspect prior MI involving the LCx. Creatinine trended down, milrinone was stopped.     Patient returned to [facility] for followup of CHF 02/27/22 with Dr. Aundra Dubin.  He had been feeling good since discharge.  No further lightheadedness.  He had not been taking a loop diuretic.  No dyspnea walking on flat ground or walking up stairs.  Stated he felt the best he had felt in a long time.  No chest pain. No orthopnea/PND.  He was wearing his LifeVest.    Today he returns to HF clinic for pharmacist medication titration with his wife.  At recent visits to HF Clinic, spironolactone 25 mg daily and KCL 10 mEq daily were initiated. Additionally, he was instructed to drink more fluids because he appeared dehydrated. Overall he is feeling well today. Notes mild dizziness which usually happens first thing in the morning and is not bothersome. Dizziness has improved since he increased  his fluid intake. He is now drinking 4 bottles of water per day. No CP or palpitations. No SOB/DOE. Weight has been stable at 134-136 lbs at home. He does not need a loop diuretic. No LEE, PND or orthopnea. BP in clinic elevated at 172/78. This correlates with his home BP machine which he brought in for calibration today. Taking all medications as prescribed and tolerating all medications.    HF Medications: Entresto 24/26 mg BID Spironolactone 25  mg daily Digoxin 0.125 mg daily KCL 10 mEq daily  Has the patient been  experiencing any side effects to the medications prescribed?  No - dizziness has improved after increasing PO fluid intake.   Does the patient have any problems obtaining medications due to transportation or finances?   No - has [employer]  Understanding of regimen: good Understanding of indications: fair Potential of compliance: good Patient understands to avoid NSAIDs. Patient understands to avoid decongestants.    Pertinent Lab Values: 03/07/2022: Serum creatinine 2.03, BUN 15, Potassium 3.8, Digoxin 1.7 (02/27/22)  04/04/2022: Serum creatinine 1.95, BUN 17, Potassium 3.3, Sodium 140 BMET and digoxin level pending  Vital Signs:  Weight: 142.2 lbs (last clinic weight: 141.2 lbs) Blood pressure: 172/78 Heart rate: 55  Assessment/Plan: 1. Chronic systolic CHF: Suspect primarily nonischemic cardiomyopathy.  Echo in 2020 with EF 50%.  Echo in 12/2021 with EF < 20%, moderate-severe LV dilation, mild RVE with mildly decreased RV systolic function.  The drop does not appear to be due to CAD, cath in 12/2021 with nonobstructive disease.  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  Possible viral myocarditis, possible viral PNA earlier this year.  Also certainly possible this could be a tachy-mediated CMP given atrial fibrillation with RVR noted at admission in 01/2022.  TEE showed EF 20%, moderate LV dilation, mildly decreased RV systolic function. Cardiac MRI was done, showing severely dilated LV with diffuse hypokinesis EF 24%, normal RV size with mildly decreased systolic function EF 21%; delayed enhancement images show coronary-pattern LGE in a circumflex coronary artery distribution, suspect prior MI involving the LCx, though do not think this can explain the extent of the cardiomyopathy.   - NYHA class II symptoms.  Not volume overloaded on exam.  -BMET and digoxin level pending.  - He does not appear to need a diuretic.  - Continue Entresto  24/26 mg BID - Continue spironolactone 25 mg daily   - Start hydralazine 25 mg TID and Imdur 30 mg daily - Continue digoxin 0.125 mg daily.  - Hold beta blocker with bradycardia in setting of amiodarone use.   - No SGLT2 inhibitor for now with h/o UTIs.  - Continue Lifevest for now, echo in 2 months to determine need for ICD.   2. Atrial fibrillation: TEE/DCCV to NSR in 01/2022.  Possible component of tachycardia-mediated cardiomyopathy. - Continue amiodarone 200 mg daily. Referred to endocrinology for concern for hyperthyroidism on recent labs.  - Continue Eliquis.  - Previously referred to EP for consideration of atrial fibrillation ablation given possible component of tachycardia-mediated CMP.    3. NSVT: Has been wearing Lifevest at home.   - On amiodarone.   4. CKD stage 3: Scr 1.95 04/04/22, BMET today pending  5. CAD: PCI to LCx remotely.  LHC in 12/2021 with nonobstructive CAD.  Cannot explain CMP.  - No ASA given Eliquis use.  - Continue atorvastatin.   6. Hypertension -BP has been elevated recently -Start hydralazine and Imdur  as above.   7. Hypocalcemia: PTH was high when checked, ?due to renal disease.    Follow up in 2 weeks with [facility].   Audry Riles, PharmD, BCPS, BCCP, CPP Heart Failure Clinic Pharmacist [phone]

## 2022-04-11 ENCOUNTER — Ambulatory Visit (HOSPITAL_COMMUNITY)
Admission: RE | Admit: 2022-04-11 | Discharge: 2022-04-11 | Disposition: A | Discharge status: Home / Self Care | Payer: Medicare HMO | Source: Ambulatory Visit | Attending: Cardiology | Admitting: Cardiology

## 2022-04-11 ENCOUNTER — Other Ambulatory Visit (HOSPITAL_COMMUNITY): Payer: Self-pay | Admitting: Pharmacist

## 2022-04-11 VITALS — BP 172/78 | HR 55 | Wt 142.2 lb

## 2022-04-11 DIAGNOSIS — I428 Other cardiomyopathies: Secondary | ICD-10-CM | POA: Diagnosis not present

## 2022-04-11 DIAGNOSIS — I5042 Chronic combined systolic (congestive) and diastolic (congestive) heart failure: Secondary | ICD-10-CM

## 2022-04-11 DIAGNOSIS — I5022 Chronic systolic (congestive) heart failure: Secondary | ICD-10-CM | POA: Diagnosis not present

## 2022-04-11 DIAGNOSIS — I2510 Atherosclerotic heart disease of native coronary artery without angina pectoris: Secondary | ICD-10-CM | POA: Insufficient documentation

## 2022-04-11 DIAGNOSIS — N183 Chronic kidney disease, stage 3 unspecified: Secondary | ICD-10-CM | POA: Diagnosis not present

## 2022-04-11 DIAGNOSIS — I4891 Unspecified atrial fibrillation: Secondary | ICD-10-CM | POA: Diagnosis not present

## 2022-04-11 DIAGNOSIS — I130 Hypertensive heart and chronic kidney disease with heart failure and stage 1 through stage 4 chronic kidney disease, or unspecified chronic kidney disease: Secondary | ICD-10-CM | POA: Insufficient documentation

## 2022-04-11 DIAGNOSIS — Z7901 Long term (current) use of anticoagulants: Secondary | ICD-10-CM | POA: Insufficient documentation

## 2022-04-11 LAB — BASIC METABOLIC PANEL
Anion gap: 10 (ref 5–15)
BUN: 19 mg/dL (ref 8–23)
CO2: 25 mmol/L (ref 22–32)
Calcium: 9 mg/dL (ref 8.9–10.3)
Chloride: 105 mmol/L (ref 98–111)
Creatinine, Ser: 1.96 mg/dL — ABNORMAL HIGH (ref 0.61–1.24)
GFR, Estimated: 35 mL/min — ABNORMAL LOW (ref >60)
Glucose, Bld: 88 mg/dL (ref 70–99)
Potassium: 3.8 mmol/L (ref 3.5–5.1)
Sodium: 140 mmol/L (ref 135–145)

## 2022-04-11 LAB — DIGOXIN LEVEL: Digoxin Level: 1.6 ng/mL (ref 0.8–2.0)

## 2022-04-11 MED ORDER — HYDRALAZINE HCL 25 MG PO TABS: 25.0000 mg | ORAL_TABLET | Freq: Three times a day (TID) | ORAL | 3 refills | Status: DC

## 2022-04-11 MED ORDER — DIGOXIN 125 MCG PO TABS: 0.0625 mg | ORAL_TABLET | Freq: Every day | ORAL | 5 refills | Status: DC

## 2022-04-11 MED ORDER — ISOSORBIDE MONONITRATE ER 30 MG PO TB24: 30.0000 mg | ORAL_TABLET | Freq: Every day | ORAL | 3 refills | Status: DC

## 2022-04-11 NOTE — Patient Instructions (Signed)
It was a pleasure seeing you today!  MEDICATIONS: -We are changing your medications today -Start hydralazine 25 mg (1 tablet) three times daily -Start isosorbide mononitrate 30 mg (1 tablet) daily -Call if you have questions about your medications.  LABS: -We will call you if your labs need attention.  NEXT APPOINTMENT: Return to clinic in 2 weeks with [facility].  In general, to take care of your heart failure: -Limit your fluid intake to 2 Liters (half-gallon) per day.   -Limit your salt intake to ideally 2-3 grams (2000-3000 mg) per day. -Weigh yourself daily and record, and bring that "weight diary" to your next appointment.  (Weight gain of 2-3 pounds in 1 day typically means fluid weight.) -The medications for your heart are to help your heart and help you live longer.   -Please contact us before stopping any of your heart medications.  Call the clinic at [phone] with questions or to reschedule future appointments.

## 2022-04-11 NOTE — Progress Notes (Signed)
Digoxin level elevated (this is a true trough). Hold digoxin for 3 days, then decrease to 0.0625 mg daily. Repeat digoxin level 04/24/22 - patient will hold digoxin before lab draw.

## 2022-04-16 ENCOUNTER — Encounter: Payer: Self-pay | Admitting: Cardiology

## 2022-04-16 ENCOUNTER — Ambulatory Visit: Payer: Medicare HMO | Admitting: Cardiology

## 2022-04-16 VITALS — BP 164/82 | HR 66 | Ht 65.0 in | Wt 139.2 lb

## 2022-04-16 DIAGNOSIS — I428 Other cardiomyopathies: Secondary | ICD-10-CM | POA: Diagnosis not present

## 2022-04-16 DIAGNOSIS — I4891 Unspecified atrial fibrillation: Secondary | ICD-10-CM | POA: Diagnosis not present

## 2022-04-16 DIAGNOSIS — I252 Old myocardial infarction: Secondary | ICD-10-CM | POA: Diagnosis not present

## 2022-04-16 DIAGNOSIS — I5042 Chronic combined systolic (congestive) and diastolic (congestive) heart failure: Secondary | ICD-10-CM

## 2022-04-16 DIAGNOSIS — I10 Essential (primary) hypertension: Secondary | ICD-10-CM

## 2022-04-16 DIAGNOSIS — I420 Dilated cardiomyopathy: Secondary | ICD-10-CM | POA: Diagnosis not present

## 2022-04-16 NOTE — Patient Instructions (Signed)
Medication Instructions:  Your physician recommends that you continue on your current medications as directed. Please refer to the Current Medication list given to you today. *If you need a refill on your cardiac medications before your next appointment, please call your pharmacy*  Lab Work: None. If you have labs (blood work) drawn today and your tests are completely normal, you will receive your results only by: [facility] (if you have MyChart) OR A paper copy in the mail If you have any lab test that is abnormal or we need to change your treatment, we will call you to review the results.  Testing/Procedures: None.  Follow-Up: At [facility], you and your health needs are our priority.  As part of our continuing mission to provide you with exceptional heart care, we have created designated Provider Care Teams.  These Care Teams include your primary Cardiologist (physician) and Advanced Practice Providers (APPs -  Physician Assistants and Nurse Practitioners) who all work together to provide you with the care you need, when you need it.  Your physician wants you to follow-up in: 3 months with Lars Mage, MD   We recommend signing up for the patient portal called "MyChart".  Sign up information is provided on this After Visit Summary.  MyChart is used to connect with patients for Virtual Visits (Telemedicine).  Patients are able to view lab/test results, encounter notes, upcoming appointments, etc.  Non-urgent messages can be sent to your provider as well.   To learn more about what you can do with MyChart, go to [email or web address].    Any Other Special Instructions Will Be Listed Below (If Applicable).

## 2022-04-16 NOTE — Progress Notes (Signed)
Electrophysiology Office Note:    Date:  04/16/2022   ID:  Ronald Mcdowell, DOB [birth year], MRN [id number]  PCP:  Neale Burly, MD  [facility] Cardiologist:  Carlyle Dolly, MD  [facility] Electrophysiologist:  Vickie Epley, MD   Referring MD: Larey Dresser, MD   Chief Complaint: New patient consult for Afib ablation  History of Present Illness:    Ronald Mcdowell is a 76 y.o. male who presents for an evaluation for consideration of Afib ablation at the request of Dr. Aundra Dubin. Their medical history includes atrial fibrillation, MI, ischemic cardiomyopathy, CAD, CKD, hypertension, hyperthyroidism, hyperlipidemia, GERD, and Graves disease.  History of MI in 2000 w/ prior LCX stenting.  07/2013 Cath/PCI: LM nl, LAD 10-20, D1 40-50p, LCX 95-99 @ distal stent margin (2.5x20 Promus Premier DES), RCA dom, 40p, 45m 70d, EF 35-40%.  He saw Dr. MAundra Dubinon 02/27/2022. He was feeling much better since his cardioversion. It was noted that onset of his Afib was in 07/2021. He was admitted 12/2021 with acute heart failure. He was in Afib, but not cardioverted as V-rates were decently controlled. In 01/2022 he was admitted and found to be in Afib with RVR in the 130's, creatinine up to 3.32. He underwent TEE-guided DCCV and reverted to NSR. He was referred to EP for consideration of atrial fibrillation ablation given possible component of tachycardia-mediated CMP.  Overall, he states he is feeling well today. Recently he has not noticed any palpitations or arrhythmias.  Since the middle of 01/2022 he has been wearing his LifeVest. 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   He denies any chest pain, shortness of breath, or peripheral edema. No lightheadedness, headaches, syncope, orthopnea, or PND.     Past Medical History:  Diagnosis Date   Anemia    Atrial fibrillation (HCC)    CHF (congestive heart failure) (HCC)    Chronic kidney disease,  unspecified 01/14/2020   Coronary artery disease    a. h/o MI in 2000 w/ prior LCX stenting;  b. 7.2014 Abnl Cardiolite, EF 41%, mod-large inferolat scar;  c. 07/2013 Cath/PCI: LM nl, LAD 10-20, D1 40-50p, LCX 95-99 @ distal stent margin (2.5x20 Promus Premier DES), RCA dom, 40p, 820m70d, EF 35-40%.   Elevated cholesterol    GERD (gastroesophageal reflux disease)    Graves disease 04/2020   s/p I-131 thyroid ablation    Hemorrhoids    Hypertension    Hyperthyroidism 09/27/2020   Ischemic cardiomyopathy    a. 07/2013 EF 35-40% by LV gram.; EF 50% on ECHO in November 2020   [facility]   Reflux esophagitis    Spondylosis of cervical joint    C3-4, C6-7   Tobacco user    Vitamin D deficiency disease 08/24/2019    Past Surgical History:  Procedure Laterality Date   BIOPSY  01/09/2018   Procedure: BIOPSY;  Surgeon: RoDaneil DolinMD;  [facility];  Service: Endoscopy;;  esophageal biopsies   CARDIAC CATHETERIZATION     CARDIOVERSION N/A 02/18/2022   Procedure: CARDIOVERSION;  Surgeon: McLarey DresserMD;  [facility] Service: Cardiovascular;  Laterality: N/A;   COLONOSCOPY     COLONOSCOPY N/A 01/25/2016   RMR: normal ileocolonoscopy ( anal canal hemorrhoids)   CORONARY ANGIOPLASTY WITH STENT PLACEMENT  08/23/2013   mid circumflex  DES    by Dr JoMartinique CORONARY STENT PLACEMENT  2000   ESOPHAGOGASTRODUODENOSCOPY N/A  01/25/2016   RMR: Reflux esophagitits. Cervical web with passage of the scope. Status post esophageal biosy. Hiatal hernia.    ESOPHAGOGASTRODUODENOSCOPY N/A 01/09/2018   Rourk: cervical/proximal esophageal web s/p dilation, markedly abnormal esophagus c/w biopsy proven eosinophilic esophagitis. small hiatal hernia.   ESOPHAGOGASTRODUODENOSCOPY N/A 05/31/2020   Procedure: ESOPHAGOGASTRODUODENOSCOPY (EGD);  Surgeon: Daneil Dolin, MD;  [facility];  Service: Endoscopy;  Laterality: N/A;  8:30am   LEFT HEART CATHETERIZATION  WITH CORONARY ANGIOGRAM N/A 08/23/2013   Procedure: LEFT HEART CATHETERIZATION WITH CORONARY ANGIOGRAM;  Surgeon: Peter M Martinique, MD;  Location: [facility] CATH LAB;  Service: Cardiovascular;  Laterality: N/A;   MALONEY DILATION N/A 01/09/2018   Procedure: [name] DILATION;  Surgeon: Daneil Dolin, MD;  [facility];  Service: Endoscopy;  Laterality: N/A;   MALONEY DILATION N/A 05/31/2020   Procedure: [name] DILATION;  Surgeon: Daneil Dolin, MD;  [facility];  Service: Endoscopy;  Laterality: N/A;   RIGHT/LEFT HEART CATH AND CORONARY ANGIOGRAPHY N/A 01/16/2022   Procedure: RIGHT/LEFT HEART CATH AND CORONARY ANGIOGRAPHY;  Surgeon: Belva Crome, MD;  Location: [place] CV LAB;  Service: Cardiovascular;  Laterality: N/A;   TEE WITHOUT CARDIOVERSION N/A 02/18/2022   Procedure: TRANSESOPHAGEAL ECHOCARDIOGRAM (TEE);  Surgeon: Larey Dresser, MD;  Location: [facility] ENDOSCOPY;  Service: Cardiovascular;  Laterality: N/A;   TRANSURETHRAL RESECTION OF PROSTATE  01/09/2012   Procedure: TRANSURETHRAL RESECTION OF THE PROSTATE (TURP);  Surgeon: Marissa Nestle, MD;  Location: [facility];  Service: Urology;  Laterality: N/A;    Current Medications: Current Meds  Medication Sig   amiodarone (PACERONE) 200 MG tablet Take 2 tablets (400 mg) twice a day for 7 days. Then take 1 tablet  (200 mg) twice a day for 7 days. Then take 1 tablet  (200 mg) daily.   apixaban (ELIQUIS) 5 MG TABS tablet TAKE 1 TABLET(5 MG) BY MOUTH TWICE DAILY   atorvastatin (LIPITOR) 80 MG tablet Take 1 tablet (80 mg total) by mouth daily.   Cholecalciferol (VITAMIN D-3) 125 MCG (5000 UT) TABS Take 1 tablet by mouth daily.   digoxin (LANOXIN) 0.125 MG tablet Take 0.5 tablets (0.0625 mg total) by mouth daily.   hydrALAZINE (APRESOLINE) 25 MG tablet Take 1 tablet (25 mg total) by mouth 3 (three) times daily.   isosorbide mononitrate (IMDUR) 30 MG 24 hr tablet Take 1 tablet (30 mg total) by mouth daily.   nitroGLYCERIN  (NITROSTAT) 0.4 MG SL tablet Place 1 tablet (0.4 mg total) under the tongue every 5 (five) minutes as needed for chest pain.   pantoprazole (PROTONIX) 40 MG tablet Take 1 tablet (40 mg total) by mouth daily.   potassium chloride SA (KLOR-CON M) 10 MEQ tablet Take 1 tablet (10 mEq total) by mouth daily.   sacubitril-valsartan (ENTRESTO) 24-26 MG Take 1 tablet by mouth 2 (two) times daily.   spironolactone (ALDACTONE) 25 MG tablet Take 1 tablet (25 mg total) by mouth daily.     Allergies:   Peanut butter flavor   Social History   Socioeconomic History   Marital status: Married    Spouse name: [name]   Number of children: Not on file   Years of education: 11th grade   Highest education level: 11th grade  Occupational History   Occupation: Librarian, academic  Tobacco Use   Smoking status: Every Day    Packs/day: 1.00    Years: 53.00    Pack years: 53.00    Types: Cigarettes  Start date: 12/09/1965   Smokeless tobacco: Never   Tobacco comments:    3/4 pack of cigarettes daily; smoked since 76 years old  Vaping Use   Vaping Use: Never used  Substance and Sexual Activity   Alcohol use: No    Alcohol/week: 0.0 standard drinks    Comment: No bowel since year 2000.  Some previous heavy use.   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   Social Determinants of Health   Financial Resource Strain: Low Risk    Difficulty of Paying Living Expenses: Not very hard  Food Insecurity: No Food Insecurity   Worried About Charity fundraiser in the Last Year: Never true   Ran Out of Food in the Last Year: Never true  Transportation Needs: No Transportation Needs   Lack of Transportation (Medical): No   Lack of Transportation (Non-Medical): No  Physical Activity: Not on file  Stress: Not on file  Social  Connections: Not on file     Family History: The patient's family history includes Cancer in his father; Early death in his mother; Heart attack in his father; Heart disease in his brother and brother. There is no history of Anesthesia problems, Hypotension, Malignant hyperthermia, Pseudochol deficiency, Colon cancer, Liver disease, Gastric cancer, or Esophageal cancer.  ROS:   Please see the history of present illness.    All other systems reviewed and are negative.  EKGs/Labs/Other Studies Reviewed:    The following studies were reviewed today:  02/19/2022  Cardiac MRI FINDINGS: Limited images of the lung fields showed no gross abnormalities.   Small pericardial effusion primarily adjacent to the right ventricle. Severely dilated left ventricle with normal wall thickness, global hypokinesis with EF 24%. No left ventricular thrombus noted. Normal right ventricular size, mildly decreased systolic function with EF 45%. Mild left atrial enlargement. Normal right atrium. Mild mitral regurgitation. Trileaflet aortic valve, no significant regurgitation or stenosis.   On delayed enhancement imaging, there was around 50% wall thickness subendocardial late gadolinium enhancement (LGE) in the basal inferior and inferolateral walls and the mid inferolateral wall.   MEASUREMENTS: MEASUREMENTS LVEDV 316 mL   LVEDVI 192 mL/m2 LVSV 75 LVEF 24%   RVEDV 150 mL RVSV 67 mL RVEF 45%   T1 1066, ECV 30% in septum   IMPRESSION: 1.  Severely dilated LV with diffuse hypokinesis, EF 24%.   2.  Normal RV size with mildly decreased systolic function, EF 63%.   3. Delayed enhancement images show coronary-pattern LGE in a circumflex coronary artery distribution, suspect prior MI involving the LCx.   I do not think that the amount of scarring from prior MI explains the extent of his cardiomyopathy. No evidence, however, for myocarditis or infiltrative disease by LGE.  02/18/2022  Echo TEE   1. No LV thrombus. Left ventricular ejection fraction, by estimation, is  20%. The left ventricle has severely decreased function. The left  ventricle demonstrates global hypokinesis. The left ventricular internal  cavity size was moderately dilated.   2. Right ventricular systolic function is mildly reduced. The right  ventricular size is normal.   3. Left atrial size was moderately dilated. No left atrial/left atrial  appendage thrombus was detected.   4. Right atrial size was mildly dilated.   5. No PFO or ASD by  color doppler.   6. The mitral valve is normal in structure. Mild mitral valve  regurgitation. No evidence of mitral stenosis.   7. The aortic valve is tricuspid. Aortic valve regurgitation is trivial.  No aortic stenosis is present.   8. Normal caliber descending thoracic aorta with minimal plaque.   01/16/2022  Right/Left Heart Cath CONCLUSIONS: Luminal irregularities in the left main Luminal irregularities with up to 40% eccentric mid LAD Widely patent mid circumflex stent.  Diffuse disease in branches in the still second obtuse marginal. Dominant right coronary, mid eccentric 70% stenosis, distal 65% stenosis. Normal LVEDP Normal capillary wedge pressure Mixed venous O2 saturation 72%   RECOMMENDATIONS: Systolic dysfunction is out of proportion to the degree of coronary disease. Guideline directed therapy for systolic heart failure. Resume apixaban in 8 hours.  Diagnostic: Dominance: Right    EKG:   EKG is personally reviewed.  04/16/2022: EKG was not ordered.    Recent Labs: 02/18/2022: Hemoglobin 13.3; Platelets 93 02/19/2022: Magnesium 2.4 02/27/2022: ALT 24; B Natriuretic Peptide 1,146.6; TSH 0.087 04/11/2022: BUN 19; Creatinine, Ser 1.96; Potassium 3.8; Sodium 140   Recent Lipid Panel    Component Value Date/Time   CHOL 126 02/17/2022 0343   TRIG 145 02/17/2022 0343   HDL 26 (L) 02/17/2022 0343   CHOLHDL 4.8 02/17/2022 0343   VLDL 29 02/17/2022 0343    LDLCALC 71 02/17/2022 0343   LDLCALC 87 01/30/2021 0821    Physical Exam:    VS:  BP (!) 164/82   Pulse 66   Ht '5\' 5"'$  (1.651 m)   Wt 139 lb 3.2 oz (63.1 kg)   SpO2 98%   BMI 23.16 kg/m     Wt Readings from Last 3 Encounters:  04/16/22 139 lb 3.2 oz (63.1 kg)  04/11/22 142 lb 3.2 oz (64.5 kg)  04/04/22 141 lb 3.2 oz (64 kg)     GEN: Well nourished, well developed in no acute distress HEENT: Normal NECK: No JVD; No carotid bruits LYMPHATICS: No lymphadenopathy CARDIAC: RRR, no murmurs, rubs, gallops RESPIRATORY:  Clear to auscultation without rales, wheezing or rhonchi  ABDOMEN: Soft, non-tender, non-distended MUSCULOSKELETAL:  No edema; No deformity  SKIN: Warm and dry NEUROLOGIC:  Alert and oriented x 3 PSYCHIATRIC:  Normal affect       ASSESSMENT:    1. Atrial fibrillation, unspecified type ([place])   2. Chronic combined systolic and diastolic heart failure ([place])   3. Nonischemic cardiomyopathy ([place])   4. Essential hypertension    PLAN:    In order of problems listed above:  #Persistent atrial fibrillation Rhythm control indicated given chronic systolic heart failure.  Currently doing well on amiodarone.  On Eliquis for stroke prophylaxis.  Given his severely reduced left ventricular function, would favor continuing amiodarone and rechecking a limited echo this summer based on the schedule recommended by Dr. Aundra Dubin.  If his left ventricular function has improved, could consider PVI in an effort to avoid long-term exposure to amiodarone.  #Chronic combined systolic and diastolic heart failure Last ejection fraction was less than 20% by echo and 24% by cardiac MRI.  He has NYHA class II symptoms today.  Warm and dry on exam.  He is wearing a LifeVest.  I spent some time today discussing ICD therapy with the patient and his wife.  Dr. Aundra Dubin is planning to reimage his left ventricular function this summer.  If his left ventricular ejection fraction is less than 35%,  favor proceeding with ICD implant.  I  discussed this plan with the patient during today's visit.  I will schedule an appointment 3 months from now so that we can follow-up his progress and echocardiogram results.  #Hypertension Above goal today.  Continue Entresto, hydralazine and spironolactone.  Check blood pressures 1-2 times per week and bring those values to your next appointment with Dr. Aundra Dubin.    Follows up with Dr. Aundra Dubin next Tuesday Follow-up in 3 months.  Medication Adjustments/Labs and Tests Ordered: Current medicines are reviewed at length with the patient today.  Concerns regarding medicines are outlined above.  No orders of the defined types were placed in this encounter.  No orders of the defined types were placed in this encounter.   I,Mathew Stumpf,acting as a Education administrator for Vickie Epley, MD.,have documented all relevant documentation on the behalf of Vickie Epley, MD,as directed by  Vickie Epley, MD while in the presence of Vickie Epley, MD.  I, Vickie Epley, MD, have reviewed all documentation for this visit. The documentation on 04/16/22 for the exam, diagnosis, procedures, and orders are all accurate and complete.   Signed, Hilton Cork. Quentin Ore, MD, [facility], [facility] 04/16/2022 1:37 PM    Electrophysiology [facility]

## 2022-04-23 NOTE — Progress Notes (Signed)
PCP: Neale Burly, MD HF Cardiology: Dr. Aundra Dubin  76 y.o. with history of CAD, CKD stage 3, persistent atrial fibrillation, and probably primarily nonischemic cardiomyopathy.  He had PCI to LCx in 2000 and again in 9/14. Echo in 2020 showed EF 50% with normal RV.     He was noted to be in atrial fibrillation starting in 9/22.    Admitted 2/23 w/ acute HF. He was in atrial fibrillation, but V-rates noted to be decently controlled and was not cardioverted. Echo showed severely reduced LVEF,  LV severely dilated, EF <20% w/ global HK, RV mildy enlarged w/ mildly reduced systolic function. No LVH. Only mild MR and trivial TR. Had subsequent R/LHC which showed only mod nonobstructive CAD w/ widely patent, previously placed LCx stents. RCA dominant w/  70% stenosis in mid portion and 65% dRCA stenosis. Systolic dysfunction out of proportion to degree of CAD. RHC showed low filling pressures, mRAP 1, mPAP 15, mPCWP 11 and preserved cardiac output, Fick CO 4.38 L/min, CI 2.59 L/min. He was placed on GDMT but no loop diuretic. LifeVest was also placed for low EF and frequent NSVT.   Unclear etiology of low EF.  Posible viral cardiomyopathy as he had suspected PNA 3-4 weeks prior to HF admission in 2/23. Also ?familial given FH. 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 No SGLTi due to recurrent UTI.    Patient was admitted in 3/23 with lightheadedness and weakness.  The day prior to admission, he had a worse episode of dizziness with palpitations and extreme weakness while doing weed-eating.  Few seconds later he had a noise (beeping) from LifeVest.  He sat down but symptoms persisted and had 2 more episodes of noise from LifeVest. Denied chest pain, shortness of breath or syncope.  He went to [facility] where he was in atrial fibrillation with rapid ventricular rate of 130s.  Placed on IV amiodarone, transferred to [facility].  Also of note, creatinine was up to 3.32.  PICC was placed, patient was  noted to have low co-ox and started on milrinone.  He was diuresed.  He ultimately had TEE-guided DCCV back to NSR.  TEE showed EF 20%, moderate LV dilation, mildly decreased RV systolic function. Cardiac MRI was done, showing severely dilated LV with diffuse hypokinesis EF 24%, normal RV size with mildly decreased systolic function EF 14%; delayed enhancement images show coronary-pattern LGE in a circumflex coronary artery distribution, suspect prior MI involving the LCx. Creatinine trended down, milrinone was stopped.    Patient returns for followup of CHF.  He has been feeling good since discharge.  No further lightheadedness.  He has not been taking a loop diuretic.  No dyspnea walking on flat ground or walking up stairs.  Says he feels the best he has felt in a long time.  No chest pain. No orthopnea/PND.  He is wearing Lifevest.   ECG (personally reviewed): NSR, LVH with repolarization abnormality.   Labs (3/23): K 4.2, creatinine 2.83 => 1.9  Past Medical History: 1. CAD: PCI to LCx 2000, PCI LCx 9/14.   - LHC (2/23): 70% mRCA, 40% mLAD.  No intervention.  2. Cardiomyopathy: Suspect predominantly nonischemic.  Echo in 2020 with EF 50%.  Echo in 2/23 with EF < 20%, moderate-severe LV dilation, mild RVE with mildly decreased RV systolic function.   - RHC (2/23): mean RA 1, PA 24/10, mean PCWP 11, CI 2.59 - TEE (3/23) showed EF 20%, moderate LV dilation, mildly decreased RV systolic  function.  - Cardiac MRI (3/23) showed severely dilated LV with diffuse hypokinesis EF 24%, normal RV size with mildly decreased systolic function EF 99%; delayed enhancement images show coronary-pattern LGE in a circumflex coronary artery distribution, suspect prior MI involving the LCx.  3. CKD stage 3 4. H/o UTIs 5. Atrial fibrillation: Persistent, noted in 9/22.  - TEE-guided DCCV in 3/23.  6. H/o Graves disease with hyperthyroidism s/p iodine thyroid ablation.  7. HTN.  8. Prior smoker 9. NSVT 10.  Thrombocytopenia: Mild, chronic.   Social History   Socioeconomic History   Marital status: Married    Spouse name: [name]   Number of children: Not on file   Years of education: 11th grade   Highest education level: 11th grade  Occupational History   Occupation: Librarian, academic  Tobacco Use   Smoking status: Every Day    Packs/day: 1.00    Years: 53.00    Pack years: 53.00    Types: Cigarettes    Start date: 12/09/1965   Smokeless tobacco: Never   Tobacco comments:    3/4 pack of cigarettes daily; smoked since 76 years old  Vaping Use   Vaping Use: Never used  Substance and Sexual Activity   Alcohol use: No    Alcohol/week: 0.0 standard drinks    Comment: No bowel since year 2000.  Some previous heavy use.   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   Social Determinants of Health   Financial Resource Strain: Low Risk    Difficulty of Paying Living Expenses: Not very hard  Food Insecurity: No Food Insecurity   Worried About Charity fundraiser in the Last Year: Never true   Ran Out of Food in the Last Year: Never true  Transportation Needs: No Transportation Needs   Lack of Transportation (Medical): No   Lack of Transportation (Non-Medical): No  Physical Activity: Not on file  Stress: Not on file  Social Connections: Not on file  Intimate Partner Violence: Not on file   Family History  Problem Relation Age of Onset   Early death Mother        childbirth   Heart attack Father    Cancer Father    Heart disease Brother        CABG   Heart disease Brother        slight heart attack   Anesthesia problems Neg Hx    Hypotension Neg Hx    Malignant hyperthermia Neg Hx    Pseudochol deficiency Neg Hx    Colon cancer Neg Hx    Liver disease Neg Hx    Gastric cancer Neg Hx     Esophageal cancer Neg Hx    ROS: All systems reviewed and negative except as per HPI.   Current Outpatient Medications  Medication Sig Dispense Refill   amiodarone (PACERONE) 200 MG tablet Take 2 tablets (400 mg) twice a day for 7 days. Then take 1 tablet  (200 mg) twice a day for 7 days. Then take 1 tablet  (200 mg) daily. 60 tablet 6   apixaban (ELIQUIS) 5 MG TABS tablet TAKE 1 TABLET(5 MG) BY MOUTH TWICE DAILY 180 tablet 1   atorvastatin (LIPITOR) 80 MG tablet Take 1 tablet (80 mg total) by mouth daily. 90 tablet 3  Cholecalciferol (VITAMIN D-3) 125 MCG (5000 UT) TABS Take 1 tablet by mouth daily.     digoxin (LANOXIN) 0.125 MG tablet Take 0.5 tablets (0.0625 mg total) by mouth daily. 15 tablet 5   hydrALAZINE (APRESOLINE) 25 MG tablet Take 1 tablet (25 mg total) by mouth 3 (three) times daily. 270 tablet 3   isosorbide mononitrate (IMDUR) 30 MG 24 hr tablet Take 1 tablet (30 mg total) by mouth daily. 90 tablet 3   nitroGLYCERIN (NITROSTAT) 0.4 MG SL tablet Place 1 tablet (0.4 mg total) under the tongue every 5 (five) minutes as needed for chest pain. 30 tablet 3   pantoprazole (PROTONIX) 40 MG tablet Take 1 tablet (40 mg total) by mouth daily. 90 tablet 3   potassium chloride SA (KLOR-CON M) 10 MEQ tablet Take 1 tablet (10 mEq total) by mouth daily. 30 tablet 0   sacubitril-valsartan (ENTRESTO) 24-26 MG Take 1 tablet by mouth 2 (two) times daily. 60 tablet 6   spironolactone (ALDACTONE) 25 MG tablet Take 1 tablet (25 mg total) by mouth daily. 30 tablet 3   No current facility-administered medications for this visit.   There were no vitals taken for this visit. General: NAD Neck: No JVD, no thyromegaly or thyroid nodule.  Lungs: Clear to auscultation bilaterally with normal respiratory effort. CV: Nondisplaced PMI.  Heart regular S1/S2, no S3/S4, no murmur.  No peripheral edema.  No carotid bruit.  Normal pedal pulses.  Abdomen: Soft, nontender, no hepatosplenomegaly, no distention.   Skin: Intact without lesions or rashes.  Neurologic: Alert and oriented x 3.  Psych: Normal affect. Extremities: No clubbing or cyanosis.  HEENT: Normal.   Assessment/Plan: 1. Chronic systolic CHF: Suspect primarily nonischemic cardiomyopathy.  Echo in 2020 with EF 50%.  Echo in 2/23 with EF < 20%, moderate-severe LV dilation, mild RVE with mildly decreased RV systolic function.  The drop does not appear to be due to CAD, cath in 2/23 with nonobstructive disease.  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  Possible viral myocarditis, possible viral PNA earlier this year.  Also certainly possible this could be a tachy-mediated CMP given atrial fibrillation with RVR noted at admission in 3/23.  TEE showed EF 20%, moderate LV dilation, mildly decreased RV systolic function. Cardiac MRI was done, showing severely dilated LV with diffuse hypokinesis EF 24%, normal RV size with mildly decreased systolic function EF 16%; delayed enhancement images show coronary-pattern LGE in a circumflex coronary artery distribution, suspect prior MI involving the LCx, though do not think this can explain the extent of the cardiomyopathy.  Currently feels well, NYHA class II symptoms.  Not volume overloaded on exam.  - He does not appear to need a diuretic.  - Continue digoxin 0.125 mg daily. Check digoxin level.  - Continue Entresto 24/26 mg twice a day  - Start spironolactone 12.5 mg daily, BMET today and in 10 days.  - Hold beta blocker with bradycardia in setting of amiodarone use.   - No SGLT2 inhibitor for now with h/o UTIs.  - Continue Lifevest for now, echo in 3 months to determine need for ICD.  2. Atrial fibrillation: TEE/DCCV to NSR in 3/23.  Possible component of tachycardia-mediated cardiomyopathy.  He is in NSR today.  - Continue amiodarone taper, will decrease to 200 mg daily soon. Check LFTs, TSH.  Will need regular eye exam.    - Continue Eliquis.  - I will refer to  EP for consideration of  atrial fibrillation ablation given possible component of tachycardia-mediated CMP.   3. NSVT: Has been wearing Lifevest at home.   - On amiodarone.  4. CKD stage 3: BMET today.  5. CAD: PCI to LCx remotely.  LHC in 2/23 with nonobstructive CAD.  Cannot explain CMP.  - No ASA given Eliquis use.  - Continue atorvastatin.  6. Hypocalcemia: PTH was high when checked, ?due to renal disease.    Followup in 3 wks with HF pharmacist, see me in 6 wks.   [place] 04/23/2022

## 2022-04-24 ENCOUNTER — Ambulatory Visit (HOSPITAL_COMMUNITY)
Admission: RE | Admit: 2022-04-24 | Discharge: 2022-04-24 | Disposition: A | Discharge status: Home / Self Care | Payer: Medicare HMO | Source: Ambulatory Visit | Attending: Family Medicine | Admitting: Family Medicine

## 2022-04-24 ENCOUNTER — Telehealth (HOSPITAL_COMMUNITY): Payer: Self-pay | Admitting: Surgery

## 2022-04-24 ENCOUNTER — Encounter (HOSPITAL_COMMUNITY): Payer: Self-pay

## 2022-04-24 VITALS — BP 142/70 | HR 67 | Wt 138.6 lb

## 2022-04-24 DIAGNOSIS — I428 Other cardiomyopathies: Secondary | ICD-10-CM | POA: Diagnosis not present

## 2022-04-24 DIAGNOSIS — Z7901 Long term (current) use of anticoagulants: Secondary | ICD-10-CM | POA: Insufficient documentation

## 2022-04-24 DIAGNOSIS — N183 Chronic kidney disease, stage 3 unspecified: Secondary | ICD-10-CM

## 2022-04-24 DIAGNOSIS — I472 Ventricular tachycardia, unspecified: Secondary | ICD-10-CM | POA: Diagnosis not present

## 2022-04-24 DIAGNOSIS — N1832 Chronic kidney disease, stage 3b: Secondary | ICD-10-CM | POA: Insufficient documentation

## 2022-04-24 DIAGNOSIS — Z87440 Personal history of urinary (tract) infections: Secondary | ICD-10-CM | POA: Insufficient documentation

## 2022-04-24 DIAGNOSIS — I4819 Other persistent atrial fibrillation: Secondary | ICD-10-CM | POA: Diagnosis not present

## 2022-04-24 DIAGNOSIS — I4729 Other ventricular tachycardia: Secondary | ICD-10-CM | POA: Diagnosis not present

## 2022-04-24 DIAGNOSIS — Z79899 Other long term (current) drug therapy: Secondary | ICD-10-CM | POA: Diagnosis not present

## 2022-04-24 DIAGNOSIS — E0590 Thyrotoxicosis, unspecified without thyrotoxic crisis or storm: Secondary | ICD-10-CM | POA: Diagnosis not present

## 2022-04-24 DIAGNOSIS — I2510 Atherosclerotic heart disease of native coronary artery without angina pectoris: Secondary | ICD-10-CM | POA: Diagnosis not present

## 2022-04-24 DIAGNOSIS — I4891 Unspecified atrial fibrillation: Secondary | ICD-10-CM

## 2022-04-24 DIAGNOSIS — I5022 Chronic systolic (congestive) heart failure: Secondary | ICD-10-CM | POA: Diagnosis not present

## 2022-04-24 DIAGNOSIS — R7989 Other specified abnormal findings of blood chemistry: Secondary | ICD-10-CM

## 2022-04-24 DIAGNOSIS — I130 Hypertensive heart and chronic kidney disease with heart failure and stage 1 through stage 4 chronic kidney disease, or unspecified chronic kidney disease: Secondary | ICD-10-CM | POA: Insufficient documentation

## 2022-04-24 LAB — T4, FREE: Free T4: 1.13 ng/dL — ABNORMAL HIGH (ref 0.61–1.12)

## 2022-04-24 LAB — DIGOXIN LEVEL: Digoxin Level: 1.3 ng/mL (ref 0.8–2.0)

## 2022-04-24 NOTE — Patient Instructions (Signed)
It was great to see you today! No medication changes are needed at this time.   Labs today We will only contact you if something comes back abnormal or we need to make some changes. Otherwise no news is good news!   Please be sure to follow up with primary care providers regarding thyroid   Your physician recommends that you schedule a follow-up appointment in: 2-3 months with Dr Aundra Dubin and echo  Your physician has requested that you have an echocardiogram. Echocardiography is a painless test that uses sound waves to create images of your heart. It provides your doctor with information about the size and shape of your heart and how well your heart's chambers and valves are working. This procedure takes approximately one hour. There are no restrictions for this procedure.  Do the following things EVERYDAY: Weigh yourself in the morning before breakfast. Write it down and keep it in a log. Take your medicines as prescribed Eat low salt foods--Limit salt (sodium) to 2000 mg per day.  Stay as active as you can everyday Limit all fluids for the day to less than 2 liters  At the [facility], you and your health needs are our priority. As part of our continuing mission to provide you with exceptional heart care, we have created designated Provider Care Teams. These Care Teams include your primary Cardiologist (physician) and Advanced Practice Providers (APPs- Physician Assistants and Nurse Practitioners) who all work together to provide you with the care you need, when you need it.   You may see any of the following providers on your designated Care Team at your next follow up: Dr Glori Bickers Dr Haynes Kerns, NP Lyda Jester, [place] [facility] [place], [place] Audry Riles, PharmD   Please be sure to bring in all your medications bottles to every appointment.

## 2022-04-24 NOTE — Telephone Encounter (Signed)
Patient contacted and made are of results and recommendations per provider. Patient wishes to have lab work completed at [facility] in [place].  He will have them done in AM and I instructed him no to take his Digoxin prior to appt time.  I have sent fax copy of lab request to this [place] location.

## 2022-04-24 NOTE — Telephone Encounter (Signed)
-----   Message from Rafael Bihari, [place] sent at 04/24/2022 11:31 AM EDT ----- Dig level elevated. This was not a trough.   He needs a digoxin trough this week, please tell him to not take his dig before his lab visit. He lives in [place].

## 2022-04-25 LAB — T3, FREE: T3, Free: 1.5 pg/mL — ABNORMAL LOW (ref 2.0–4.4)

## 2022-04-29 ENCOUNTER — Other Ambulatory Visit (HOSPITAL_COMMUNITY): Payer: Self-pay | Admitting: Internal Medicine

## 2022-05-01 ENCOUNTER — Other Ambulatory Visit (HOSPITAL_COMMUNITY): Payer: Self-pay

## 2022-05-01 MED ORDER — POTASSIUM CHLORIDE ER 10 MEQ PO TBCR: 10.0000 meq | EXTENDED_RELEASE_TABLET | Freq: Every day | ORAL | 5 refills | Status: DC

## 2022-05-02 ENCOUNTER — Other Ambulatory Visit (HOSPITAL_COMMUNITY): Payer: Self-pay

## 2022-05-02 ENCOUNTER — Other Ambulatory Visit (HOSPITAL_COMMUNITY): Payer: Self-pay | Admitting: *Deleted

## 2022-05-02 MED ORDER — DIGOXIN 125 MCG PO TABS: 0.0625 mg | ORAL_TABLET | Freq: Every day | ORAL | 2 refills | Status: DC

## 2022-05-02 MED ORDER — SPIRONOLACTONE 25 MG PO TABS: 12.5000 mg | ORAL_TABLET | Freq: Every day | ORAL | 2 refills | Status: AC

## 2022-05-02 MED ORDER — HYDRALAZINE HCL 25 MG PO TABS: 25.0000 mg | ORAL_TABLET | Freq: Three times a day (TID) | ORAL | 3 refills | Status: DC

## 2022-05-02 MED ORDER — AMIODARONE HCL 200 MG PO TABS: 200.0000 mg | ORAL_TABLET | Freq: Every day | ORAL | 2 refills | Status: DC

## 2022-05-02 MED ORDER — POTASSIUM CHLORIDE ER 10 MEQ PO TBCR
10.0000 meq | EXTENDED_RELEASE_TABLET | Freq: Every day | ORAL | 5 refills | Status: DC
Start: 2022-05-02 — End: 2022-11-19

## 2022-05-03 ENCOUNTER — Telehealth (HOSPITAL_COMMUNITY): Payer: Self-pay | Admitting: Licensed Clinical Social Worker

## 2022-05-03 ENCOUNTER — Encounter (HOSPITAL_COMMUNITY): Payer: Self-pay | Admitting: *Deleted

## 2022-05-03 NOTE — Progress Notes (Signed)
STD forms completed, signed by Dr Aundra Dubin, and faxed along with last OV note to [email or web address] at [phone]

## 2022-05-03 NOTE — Telephone Encounter (Signed)
Pt wife  called and requested help getting letter clearing him to work resent to pt job as they are trying to send him home.  CSW found letter in patient chart and resent to requested fax number  Jorge Ny, [facility] Desk#: [phone] Cell#: [phone]

## 2022-05-07 DIAGNOSIS — I2510 Atherosclerotic heart disease of native coronary artery without angina pectoris: Secondary | ICD-10-CM | POA: Diagnosis not present

## 2022-05-07 DIAGNOSIS — Z6824 Body mass index (BMI) 24.0-24.9, adult: Secondary | ICD-10-CM | POA: Diagnosis not present

## 2022-05-07 DIAGNOSIS — I5022 Chronic systolic (congestive) heart failure: Secondary | ICD-10-CM | POA: Diagnosis not present

## 2022-05-07 DIAGNOSIS — K219 Gastro-esophageal reflux disease without esophagitis: Secondary | ICD-10-CM | POA: Diagnosis not present

## 2022-05-07 DIAGNOSIS — I10 Essential (primary) hypertension: Secondary | ICD-10-CM | POA: Diagnosis not present

## 2022-05-07 DIAGNOSIS — E7849 Other hyperlipidemia: Secondary | ICD-10-CM | POA: Diagnosis not present

## 2022-05-14 DIAGNOSIS — K219 Gastro-esophageal reflux disease without esophagitis: Secondary | ICD-10-CM | POA: Diagnosis not present

## 2022-05-14 DIAGNOSIS — I5022 Chronic systolic (congestive) heart failure: Secondary | ICD-10-CM | POA: Diagnosis not present

## 2022-05-14 DIAGNOSIS — E7849 Other hyperlipidemia: Secondary | ICD-10-CM | POA: Diagnosis not present

## 2022-05-14 DIAGNOSIS — I10 Essential (primary) hypertension: Secondary | ICD-10-CM | POA: Diagnosis not present

## 2022-05-14 DIAGNOSIS — I2510 Atherosclerotic heart disease of native coronary artery without angina pectoris: Secondary | ICD-10-CM | POA: Diagnosis not present

## 2022-05-14 DIAGNOSIS — N481 Balanitis: Secondary | ICD-10-CM | POA: Diagnosis not present

## 2022-05-17 DIAGNOSIS — I252 Old myocardial infarction: Secondary | ICD-10-CM | POA: Diagnosis not present

## 2022-05-17 DIAGNOSIS — I420 Dilated cardiomyopathy: Secondary | ICD-10-CM | POA: Diagnosis not present

## 2022-06-16 DIAGNOSIS — I252 Old myocardial infarction: Secondary | ICD-10-CM | POA: Diagnosis not present

## 2022-06-16 DIAGNOSIS — I420 Dilated cardiomyopathy: Secondary | ICD-10-CM | POA: Diagnosis not present

## 2022-07-02 ENCOUNTER — Other Ambulatory Visit (HOSPITAL_COMMUNITY): Payer: Self-pay

## 2022-07-17 DIAGNOSIS — I252 Old myocardial infarction: Secondary | ICD-10-CM | POA: Diagnosis not present

## 2022-07-17 DIAGNOSIS — I420 Dilated cardiomyopathy: Secondary | ICD-10-CM | POA: Diagnosis not present

## 2022-07-18 NOTE — Progress Notes (Deleted)
Electrophysiology Office Follow up Visit Note:    Date:  07/18/2022   ID:  Ronald Mcdowell, DOB 05-16-1946, MRN [id number]  PCP:  Neale Burly, MD  [facility] Cardiologist:  Carlyle Dolly, MD  [facility] Electrophysiologist:  Vickie Epley, MD    Interval History:    Ronald Mcdowell is a 76 y.o. male who presents for a follow up visit. They were last seen in clinic on Apr 16, 2022 for an opinion about his atrial fibrillation.  He was maintaining normal rhythm on amiodarone.  Given his EF being severely reduced, repeat echo was planned prior to follow-up with me.  He was also wearing a LifeVest at the time of our last appointment.  We planned to base a decision about ICD on repeat echo results.       Past Medical History:  Diagnosis Date   Anemia    Atrial fibrillation (HCC)    CHF (congestive heart failure) (HCC)    Chronic kidney disease, unspecified 01/14/2020   Coronary artery disease    a. h/o MI in 2000 w/ prior LCX stenting;  b. 7.2014 Abnl Cardiolite, EF 41%, mod-large inferolat scar;  c. 07/2013 Cath/PCI: LM nl, LAD 10-20, D1 40-50p, LCX 95-99 @ distal stent margin (2.5x20 Promus Premier DES), RCA dom, 40p, 74m 70d, EF 35-40%.   Elevated cholesterol    GERD (gastroesophageal reflux disease)    Graves disease 04/2020   s/p I-131 thyroid ablation    Hemorrhoids    Hypertension    Hyperthyroidism 09/27/2020   Ischemic cardiomyopathy    a. 07/2013 EF 35-40% by LV gram.; EF 50% on ECHO in November 2020   Myocardial infarction ([facility]    Reflux esophagitis    Spondylosis of cervical joint    C3-4, C6-7   Tobacco user    Vitamin D deficiency disease 08/24/2019    Past Surgical History:  Procedure Laterality Date   BIOPSY  01/09/2018   Procedure: BIOPSY;  Surgeon: RDaneil Dolin MD;  [facility];  Service: Endoscopy;;  esophageal biopsies   CARDIAC CATHETERIZATION     CARDIOVERSION N/A 02/18/2022   Procedure: CARDIOVERSION;  Surgeon: MLarey Dresser MD;  [place]  Service: Cardiovascular;  Laterality: N/A;   COLONOSCOPY     COLONOSCOPY N/A 01/25/2016   RMR: normal ileocolonoscopy ( anal canal hemorrhoids)   CORONARY ANGIOPLASTY WITH STENT PLACEMENT  08/23/2013   mid circumflex  DES    by Dr JMartinique  CORONARY STENT PLACEMENT  2000   ESOPHAGOGASTRODUODENOSCOPY N/A 01/25/2016   RMR: Reflux esophagitits. Cervical web with passage of the scope. Status post esophageal biosy. Hiatal hernia.    ESOPHAGOGASTRODUODENOSCOPY N/A 01/09/2018   Rourk: cervical/proximal esophageal web s/p dilation, markedly abnormal esophagus c/w biopsy proven eosinophilic esophagitis. small hiatal hernia.   ESOPHAGOGASTRODUODENOSCOPY N/A 05/31/2020   Procedure: ESOPHAGOGASTRODUODENOSCOPY (EGD);  Surgeon: RDaneil Dolin MD;  [facility];  Service: Endoscopy;  Laterality: N/A;  8:30am   LEFT HEART CATHETERIZATION WITH CORONARY ANGIOGRAM N/A 08/23/2013   Procedure: LEFT HEART CATHETERIZATION WITH CORONARY ANGIOGRAM;  Surgeon: Peter M JMartinique MD;  [facility];  Service: Cardiovascular;  Laterality: N/A;   MALONEY DILATION N/A 01/09/2018   Procedure: MVenia MinksDILATION;  Surgeon: RDaneil Dolin MD;  [facility];  Service: Endoscopy;  Laterality: N/A;   MALONEY DILATION N/A 05/31/2020   Procedure: MVenia MinksDILATION;  Surgeon: RDaneil Dolin MD;  [facility];  Service:  Endoscopy;  Laterality: N/A;   RIGHT/LEFT HEART CATH AND CORONARY ANGIOGRAPHY N/A 01/16/2022   Procedure: RIGHT/LEFT HEART CATH AND CORONARY ANGIOGRAPHY;  Surgeon: Belva Crome, MD;  Location: [place] CV LAB;  Service: Cardiovascular;  Laterality: N/A;   TEE WITHOUT CARDIOVERSION N/A 02/18/2022   Procedure: TRANSESOPHAGEAL ECHOCARDIOGRAM (TEE);  Surgeon: Larey Dresser, MD;  Location: [facility] ENDOSCOPY;  Service: Cardiovascular;  Laterality: N/A;   TRANSURETHRAL RESECTION OF PROSTATE  01/09/2012   Procedure: TRANSURETHRAL RESECTION OF THE PROSTATE  (TURP);  Surgeon: Marissa Nestle, MD;  Location: [facility] OR[facility];  Service: Urology;  Laterality: N/A;    Current Medications: No outpatient medications have been marked as taking for the 07/19/22 encounter (Appointment) with Vickie Epley, MD.     Allergies:   Peanut butter flavor   Social History   Socioeconomic History   Marital status: Married    Spouse name: [name]   Number of children: Not on file   Years of education: 11th grade   Highest education level: 11th grade  Occupational History   Occupation: Librarian, academic  Tobacco Use   Smoking status: Every Day    Packs/day: 1.00    Years: 53.00    Total pack years: 53.00    Types: Cigarettes    Start date: 12/09/1965   Smokeless tobacco: Never   Tobacco comments:    3/4 pack of cigarettes daily; smoked since 76 years old  Vaping Use   Vaping Use: Never used  Substance and Sexual Activity   Alcohol use: No    Alcohol/week: 0.0 standard drinks of alcohol    Comment: No bowel since year 2000.  Some previous heavy use.   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   Social Determinants of Health   Financial Resource Strain: Low Risk  (01/23/2022)   Overall Financial Resource Strain (CARDIA)    Difficulty of Paying Living Expenses: Not very hard  Food Insecurity: No Food Insecurity (01/23/2022)   Hunger Vital Sign    Worried About Running Out of Food in the Last Year: Never true    Ran Out of Food in the Last Year: Never true  Transportation Needs: No Transportation Needs (01/23/2022)   PRAPARE - Hydrologist (Medical): No    Lack of Transportation (Non-Medical): No  Physical Activity: Sufficiently Active (08/23/2019)   Exercise Vital Sign    Days of Exercise per Week: 5 days    Minutes of Exercise per  Session: 30 min  Stress: No Stress Concern Present (08/23/2019)   [place]    Feeling of Stress : Not at all  Social Connections: Not on file     Family History: The patient's family history includes Cancer in his father; Early death in his mother; Heart attack in his father; Heart disease in his brother and brother. There is no history of Anesthesia problems, Hypotension, Malignant hyperthermia, Pseudochol deficiency, Colon cancer, Liver disease, Gastric cancer, or Esophageal cancer.  ROS:   Please see the history of present illness.    All other systems reviewed and are negative.  EKGs/Labs/Other Studies Reviewed:    The following studies were reviewed today: ***  EKG:  The ekg ordered today demonstrates ***  Recent Labs: 02/18/2022: Hemoglobin 13.3; Platelets  93 02/19/2022: Magnesium 2.4 02/27/2022: ALT 24; B Natriuretic Peptide 1,146.6; TSH 0.087 04/11/2022: BUN 19; Creatinine, Ser 1.96; Potassium 3.8; Sodium 140  Recent Lipid Panel    Component Value Date/Time   CHOL 126 02/17/2022 0343   TRIG 145 02/17/2022 0343   HDL 26 (L) 02/17/2022 0343   CHOLHDL 4.8 02/17/2022 0343   VLDL 29 02/17/2022 0343   LDLCALC 71 02/17/2022 0343   LDLCALC 87 01/30/2021 0821    Physical Exam:    VS:  There were no vitals taken for this visit.    Wt Readings from Last 3 Encounters:  04/24/22 138 lb 9.6 oz (62.9 kg)  04/16/22 139 lb 3.2 oz (63.1 kg)  04/11/22 142 lb 3.2 oz (64.5 kg)     GEN: *** Well nourished, well developed in no acute distress HEENT: Normal NECK: No JVD; No carotid bruits LYMPHATICS: No lymphadenopathy CARDIAC: ***RRR, no murmurs, rubs, gallops RESPIRATORY:  Clear to auscultation without rales, wheezing or rhonchi  ABDOMEN: Soft, non-tender, non-distended MUSCULOSKELETAL:  No edema; No deformity  SKIN: Warm and dry NEUROLOGIC:  Alert and oriented x 3 PSYCHIATRIC:  Normal affect         ASSESSMENT:    No diagnosis found. PLAN:    In order of problems listed above:           Total time spent with patient today *** minutes. This includes reviewing records, evaluating the patient and coordinating care.   Medication Adjustments/Labs and Tests Ordered: Current medicines are reviewed at length with the patient today.  Concerns regarding medicines are outlined above.  No orders of the defined types were placed in this encounter.  No orders of the defined types were placed in this encounter.    Signed, Lars Mage, MD, [facility], [facility] 07/18/2022 5:07 PM    Electrophysiology [facility]

## 2022-07-19 ENCOUNTER — Ambulatory Visit: Payer: Medicare HMO | Admitting: Cardiology

## 2022-07-19 DIAGNOSIS — I5042 Chronic combined systolic (congestive) and diastolic (congestive) heart failure: Secondary | ICD-10-CM

## 2022-07-19 DIAGNOSIS — I4821 Permanent atrial fibrillation: Secondary | ICD-10-CM

## 2022-07-19 DIAGNOSIS — G459 Transient cerebral ischemic attack, unspecified: Secondary | ICD-10-CM

## 2022-07-26 ENCOUNTER — Ambulatory Visit (HOSPITAL_COMMUNITY): Payer: Medicare HMO

## 2022-07-30 ENCOUNTER — Telehealth (HOSPITAL_COMMUNITY): Payer: Self-pay | Admitting: Pharmacy Technician

## 2022-07-30 ENCOUNTER — Other Ambulatory Visit (HOSPITAL_COMMUNITY): Payer: Self-pay

## 2022-07-30 ENCOUNTER — Ambulatory Visit (HOSPITAL_BASED_OUTPATIENT_CLINIC_OR_DEPARTMENT_OTHER)
Admission: RE | Admit: 2022-07-30 | Discharge: 2022-07-30 | Disposition: A | Discharge status: Home / Self Care | Payer: Medicare HMO | Source: Ambulatory Visit

## 2022-07-30 ENCOUNTER — Encounter (HOSPITAL_COMMUNITY): Payer: Self-pay | Admitting: Cardiology

## 2022-07-30 ENCOUNTER — Ambulatory Visit (HOSPITAL_COMMUNITY)
Admission: RE | Admit: 2022-07-30 | Discharge: 2022-07-30 | Disposition: A | Discharge status: Home / Self Care | Payer: Medicare HMO | Source: Ambulatory Visit | Attending: Cardiology | Admitting: Cardiology

## 2022-07-30 VITALS — BP 140/70 | HR 60 | Wt 137.8 lb

## 2022-07-30 DIAGNOSIS — I2510 Atherosclerotic heart disease of native coronary artery without angina pectoris: Secondary | ICD-10-CM | POA: Insufficient documentation

## 2022-07-30 DIAGNOSIS — I5022 Chronic systolic (congestive) heart failure: Secondary | ICD-10-CM

## 2022-07-30 DIAGNOSIS — N183 Chronic kidney disease, stage 3 unspecified: Secondary | ICD-10-CM | POA: Insufficient documentation

## 2022-07-30 DIAGNOSIS — Z7901 Long term (current) use of anticoagulants: Secondary | ICD-10-CM | POA: Insufficient documentation

## 2022-07-30 DIAGNOSIS — I252 Old myocardial infarction: Secondary | ICD-10-CM | POA: Diagnosis not present

## 2022-07-30 DIAGNOSIS — R001 Bradycardia, unspecified: Secondary | ICD-10-CM | POA: Diagnosis not present

## 2022-07-30 DIAGNOSIS — Z79899 Other long term (current) drug therapy: Secondary | ICD-10-CM | POA: Diagnosis not present

## 2022-07-30 DIAGNOSIS — E0590 Thyrotoxicosis, unspecified without thyrotoxic crisis or storm: Secondary | ICD-10-CM | POA: Diagnosis not present

## 2022-07-30 DIAGNOSIS — I5042 Chronic combined systolic (congestive) and diastolic (congestive) heart failure: Secondary | ICD-10-CM

## 2022-07-30 DIAGNOSIS — I4891 Unspecified atrial fibrillation: Secondary | ICD-10-CM

## 2022-07-30 DIAGNOSIS — I130 Hypertensive heart and chronic kidney disease with heart failure and stage 1 through stage 4 chronic kidney disease, or unspecified chronic kidney disease: Secondary | ICD-10-CM | POA: Insufficient documentation

## 2022-07-30 DIAGNOSIS — I472 Ventricular tachycardia, unspecified: Secondary | ICD-10-CM | POA: Insufficient documentation

## 2022-07-30 DIAGNOSIS — I4819 Other persistent atrial fibrillation: Secondary | ICD-10-CM | POA: Diagnosis not present

## 2022-07-30 LAB — DIGOXIN LEVEL: Digoxin Level: 2.2 ng/mL — ABNORMAL HIGH (ref 0.8–2.0)

## 2022-07-30 LAB — ECHOCARDIOGRAM COMPLETE
Area-P 1/2: 2.77 cm2
Calc EF: 43.3 %
S' Lateral: 4.3 cm
Single Plane A2C EF: 44.1 %
Single Plane A4C EF: 40.8 %

## 2022-07-30 LAB — BASIC METABOLIC PANEL
Anion gap: 12 (ref 5–15)
BUN: 32 mg/dL — ABNORMAL HIGH (ref 8–23)
CO2: 20 mmol/L — ABNORMAL LOW (ref 22–32)
Calcium: 9.3 mg/dL (ref 8.9–10.3)
Chloride: 109 mmol/L (ref 98–111)
Creatinine, Ser: 2.4 mg/dL — ABNORMAL HIGH (ref 0.61–1.24)
GFR, Estimated: 28 mL/min — ABNORMAL LOW (ref >60)
Glucose, Bld: 99 mg/dL (ref 70–99)
Potassium: 4.6 mmol/L (ref 3.5–5.1)
Sodium: 141 mmol/L (ref 135–145)

## 2022-07-30 LAB — TSH: TSH: 1.552 u[IU]/mL (ref 0.350–4.500)

## 2022-07-30 LAB — T4, FREE: Free T4: 1 ng/dL (ref 0.61–1.12)

## 2022-07-30 MED ORDER — SACUBITRIL-VALSARTAN 24-26 MG PO TABS: 1.0000 | ORAL_TABLET | Freq: Two times a day (BID) | ORAL | 11 refills | Status: DC

## 2022-07-30 MED ORDER — ISOSORBIDE MONONITRATE ER 30 MG PO TB24: 30.0000 mg | ORAL_TABLET | Freq: Every day | ORAL | 3 refills | Status: DC

## 2022-07-30 NOTE — Patient Instructions (Signed)
RESTART Eliquis '5mg'$  Twice daily  RESTART Entresto 24/26 Twice daily  RESTART Imdur '30mg'$  daily.  STOP  Asprin  Labs done today, your results will be available in MyChart, we will contact you for abnormal readings.  Please schedule Appointment with [facility] Endocrinology  Your physician recommends that you schedule a follow-up appointment in: 6 weeks  If you have any questions or concerns before your next appointment please send [place] a message through [place] or call our office at [phone].    TO LEAVE A MESSAGE FOR THE NURSE SELECT OPTION 2, PLEASE LEAVE A MESSAGE INCLUDING: YOUR NAME DATE OF BIRTH CALL BACK NUMBER REASON FOR CALL**this is important as we prioritize the call backs  YOU WILL RECEIVE A CALL BACK THE SAME DAY AS LONG AS YOU CALL BEFORE 4:00 PM  At the [facility], you and your health needs are our priority. As part of our continuing mission to provide you with exceptional heart care, we have created designated Provider Care Teams. These Care Teams include your primary Cardiologist (physician) and Advanced Practice Providers (APPs- Physician Assistants and Nurse Practitioners) who all work together to provide you with the care you need, when you need it.   You may see any of the following providers on your designated Care Team at your next follow up: Dr Glori Bickers Dr Loralie Champagne Dr. Roxana Hires, NP Lyda Jester, [place] [facility] [place], [place] Forestine Na, NP Audry Riles, PharmD   Please be sure to bring in all your medications bottles to every appointment.

## 2022-07-30 NOTE — Telephone Encounter (Signed)
Advanced Heart Failure Patient Advocate Encounter  Patient is currently in the donut hole, started application for Eliquis assistance through [employer]. OOP still needed.

## 2022-07-30 NOTE — Progress Notes (Signed)
  Echocardiogram 2D Echocardiogram has been performed.  Ronald Mcdowell 07/30/2022, 11:30 AM

## 2022-07-30 NOTE — Progress Notes (Signed)
Medication Samples have been provided to the patient.  Drug name: Eliquis       Strength: 5 mg        Qty: 4  LOT: [id number]  Exp.Date: 02/2024  Dosing instructions: Take 1 tablet Twice daily   The patient has been instructed regarding the correct time, dose, and frequency of taking this medication, including desired effects and most common side effects.   Ronald Mcdowell [name] 12:58 PM 07/30/2022

## 2022-07-30 NOTE — Progress Notes (Signed)
PCP: Neale Burly, MD HF Cardiology: Dr. Aundra Dubin  76 y.o. with history of CAD, CKD stage 3, persistent atrial fibrillation, and probably primarily nonischemic cardiomyopathy.  He had PCI to LCx in 2000 and again in 9/14. Echo in 2020 showed EF 50% with normal RV.     He was noted to be in atrial fibrillation starting in 9/22.    Admitted 2/23 w/ acute HF. He was in atrial fibrillation, but V-rates noted to be decently controlled and was not cardioverted. Echo showed severely reduced LVEF,  LV severely dilated, EF <20% w/ global HK, RV mildy enlarged w/ mildly reduced systolic function. No LVH. Only mild MR and trivial TR. Had subsequent R/LHC which showed only mod nonobstructive CAD w/ widely patent, previously placed LCx stents. RCA dominant w/  70% stenosis in mid portion and 65% dRCA stenosis. Systolic dysfunction out of proportion to degree of CAD. RHC showed low filling pressures, mRAP 1, mPAP 15, mPCWP 11 and preserved cardiac output, Fick CO 4.38 L/min, CI 2.59 L/min. He was placed on GDMT but no loop diuretic. LifeVest was also placed for low EF and frequent NSVT.   Unclear etiology of low EF.  Posible viral cardiomyopathy as he had suspected PNA 3-4 weeks prior to HF admission in 2/23. Also ?familial given FH. 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 No SGLTi due to recurrent UTI.    Patient was admitted in 3/23 with lightheadedness and weakness.  The day prior to admission, he had a worse episode of dizziness with palpitations and extreme weakness while doing weed-eating.  Few seconds later he had a noise (beeping) from LifeVest.  He sat down but symptoms persisted and had 2 more episodes of noise from LifeVest. Denied chest pain, shortness of breath or syncope.  He went to [facility] where he was in atrial fibrillation with rapid ventricular rate of 130s.  Placed on IV amiodarone, transferred to [facility].  Also of note, creatinine was up to 3.32.  PICC was placed, patient was  noted to have low co-ox and started on milrinone.  He was diuresed.  He ultimately had TEE-guided DCCV back to NSR.  TEE showed EF 20%, moderate LV dilation, mildly decreased RV systolic function. Cardiac MRI was done, showing severely dilated LV with diffuse hypokinesis EF 24%, normal RV size with mildly decreased systolic function EF 16%; delayed enhancement images show coronary-pattern LGE in a circumflex coronary artery distribution, suspect prior MI involving the LCx. Creatinine trended down, milrinone was stopped.    Echo was done today and reviewed, EF 35-40%, basal inferior/basal anterolateral/basal-mid inferolateral akinesis, normal RV.   Patient returns for followup of CHF.  He has been out of apixaban and Entresto, unable to afford.  He has been off Imdur. He has been feeling good overall.  No chest pain.  No exertional dyspnea.  No orthopnea/PND.  Weight down 1 lb. BP mildly elevated.   ECG (personally reviewed): NSR, LVH with repolarization abnormality.   Labs (3/23): K 4.2, creatinine 2.83 => 1.9 Labs (5/23): K 3.8, creatinine 1.96, free T3 decreased, free T4 high, TSH low, digoxin 1.3  Past Medical History: 1. CAD: PCI to LCx 2000, PCI LCx 9/14.   - LHC (2/23): 70% mRCA, 40% mLAD.  No intervention.  2. Cardiomyopathy: Suspect predominantly nonischemic.  Echo in 2020 with EF 50%.  Echo in 2/23 with EF < 20%, moderate-severe LV dilation, mild RVE with mildly decreased RV systolic function.   - RHC (2/23): mean RA 1,  PA 24/10, mean PCWP 11, CI 2.59 - TEE (3/23) showed EF 20%, moderate LV dilation, mildly decreased RV systolic function.  - Cardiac MRI (3/23) showed severely dilated LV with diffuse hypokinesis EF 24%, normal RV size with mildly decreased systolic function EF 97%; delayed enhancement images show coronary-pattern LGE in a circumflex coronary artery distribution, suspect prior MI involving the LCx.  - Echo (9/23): EF 35-40%, basal inferior/basal anterolateral/basal-mid  inferolateral akinesis, normal RV.  3. CKD stage 3 4. H/o UTIs 5. Atrial fibrillation: Persistent, noted in 9/22.  - TEE-guided DCCV in 3/23.  6. H/o Graves disease with hyperthyroidism s/p iodine thyroid ablation.  7. HTN.  8. Prior smoker 9. NSVT 10. Thrombocytopenia: Mild, chronic.   Social History   Socioeconomic History   Marital status: Married    Spouse name: [name]   Number of children: Not on file   Years of education: 11th grade   Highest education level: 11th grade  Occupational History   Occupation: Librarian, academic  Tobacco Use   Smoking status: Every Day    Packs/day: 1.00    Years: 53.00    Total pack years: 53.00    Types: Cigarettes    Start date: 12/09/1965   Smokeless tobacco: Never   Tobacco comments:    3/4 pack of cigarettes daily; smoked since 76 years old  Vaping Use   Vaping Use: Never used  Substance and Sexual Activity   Alcohol use: No    Alcohol/week: 0.0 standard drinks of alcohol    Comment: No bowel since year 2000.  Some previous heavy use.   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   Social Determinants of Health   Financial Resource Strain: Low Risk  (01/23/2022)   Overall Financial Resource Strain (CARDIA)    Difficulty of Paying Living Expenses: Not very hard  Food Insecurity: No Food Insecurity (01/23/2022)   Hunger Vital Sign    Worried About Running Out of Food in the Last Year: Never true    Ran Out of Food in the Last Year: Never true  Transportation Needs: No Transportation Needs (01/23/2022)   PRAPARE - Hydrologist (Medical): No    Lack of Transportation (Non-Medical): No  Physical Activity: Sufficiently Active (08/23/2019)   Exercise Vital Sign    Days of Exercise per Week: 5 days    Minutes of Exercise per  Session: 30 min  Stress: No Stress Concern Present (08/23/2019)   [place]    Feeling of Stress : Not at all  Social Connections: Not on file  Intimate Partner Violence: Not on file   Family History  Problem Relation Age of Onset   Early death Mother        childbirth   Heart attack Father    Cancer Father    Heart disease Brother        CABG   Heart disease Brother        slight heart attack   Anesthesia problems Neg Hx    Hypotension Neg Hx    Malignant hyperthermia Neg Hx    Pseudochol deficiency Neg Hx    Colon cancer Neg Hx    Liver disease Neg Hx    Gastric cancer Neg Hx  Esophageal cancer Neg Hx    ROS: All systems reviewed and negative except as per HPI.   Current Outpatient Medications  Medication Sig Dispense Refill   amiodarone (PACERONE) 200 MG tablet Take 1 tablet (200 mg total) by mouth daily. 90 tablet 2   atorvastatin (LIPITOR) 80 MG tablet Take 1 tablet (80 mg total) by mouth daily. 90 tablet 3   Cholecalciferol (VITAMIN D-3) 125 MCG (5000 UT) TABS Take 1 tablet by mouth daily.     digoxin (LANOXIN) 0.125 MG tablet Take 0.5 tablets (0.0625 mg total) by mouth daily. 45 tablet 2   hydrALAZINE (APRESOLINE) 25 MG tablet Take 1 tablet (25 mg total) by mouth 3 (three) times daily. 270 tablet 3   isosorbide mononitrate (IMDUR) 30 MG 24 hr tablet Take 1 tablet (30 mg total) by mouth daily. 90 tablet 3   nitroGLYCERIN (NITROSTAT) 0.4 MG SL tablet Place 1 tablet (0.4 mg total) under the tongue every 5 (five) minutes as needed for chest pain. 30 tablet 3   pantoprazole (PROTONIX) 40 MG tablet Take 1 tablet (40 mg total) by mouth daily. 90 tablet 3   potassium chloride (KLOR-CON) 10 MEQ tablet Take 1 tablet (10 mEq total) by mouth daily. 30 tablet 5   spironolactone (ALDACTONE) 25 MG tablet Take 0.5 tablets (12.5 mg total) by mouth daily. 45 tablet 2   apixaban (ELIQUIS) 5 MG TABS tablet TAKE 1 TABLET(5  MG) BY MOUTH TWICE DAILY (Patient not taking: Reported on 07/30/2022) 180 tablet 1   sacubitril-valsartan (ENTRESTO) 24-26 MG Take 1 tablet by mouth 2 (two) times daily. 60 tablet 11   No current facility-administered medications for this encounter.   BP (!) 140/70   Pulse 60   Wt 62.5 kg (137 lb 12.8 oz)   SpO2 97%   BMI 22.93 kg/m  General: NAD Neck: No JVD, no thyromegaly or thyroid nodule.  Lungs: Clear to auscultation bilaterally with normal respiratory effort. CV: Nondisplaced PMI.  Heart regular S1/S2, no S3/S4, 1/6 SEM RUSB.  No peripheral edema.  No carotid bruit.  Normal pedal pulses.  Abdomen: Soft, nontender, no hepatosplenomegaly, no distention.  Skin: Intact without lesions or rashes.  Neurologic: Alert and oriented x 3.  Psych: Normal affect. Extremities: No clubbing or cyanosis.  HEENT: Normal.   Assessment/Plan: 1. Chronic systolic CHF: Suspect primarily nonischemic cardiomyopathy.  Echo in 2020 with EF 50%.  Echo in 2/23 with EF < 20%, moderate-severe LV dilation, mild RVE with mildly decreased RV systolic function.  Certainly possible this could be a tachy-mediated CMP given atrial fibrillation with RVR noted at admission in 3/23.  TEE showed EF 20%, moderate LV dilation, mildly decreased RV systolic function. Cardiac MRI was done, showing severely dilated LV with diffuse hypokinesis EF 24%, normal RV size with mildly decreased systolic function EF 40%; delayed enhancement images show coronary-pattern LGE in a circumflex coronary artery distribution, suspect prior MI involving the LCx, though do not think this can explain the extent of the cardiomyopathy.  Echo done today and reviewed showed improved EF 35-40%, basal inferior/basal anterolateral/basal-mid inferolateral akinesis, normal RV.  I suspect that he had a tachycardia-mediated cardiomyopathy superimposed on ischemic cardiomyopathy, EF improved in NSR but still with wall motion abnormalities consistent with CAD.   Currently feels well, NYHA class II symptoms.  Not volume overloaded on exam.  - He does not appear to need a diuretic.  - Continue digoxin 0.0625, dose lowered with high level recently.  Check digoxin level.   -  Restart Entresto 24/26 mg twice a day.  BMET today and 10 days.  - Continue spironolactone 12.5 daily.   - Hold beta blocker with bradycardia in setting of amiodarone use (HR 50s today).   - Continue hydralazine 25 mg tid and will restart Imdur 30 mg daily.  - No SGLT2 inhibitor for now with h/o UTIs.  - With EF up to 35-40%, will defer ICD and can remove Lifevest.  2. Atrial fibrillation: TEE/DCCV to NSR in 3/23.  Possible component of tachycardia-mediated cardiomyopathy.  He is in NSR today.  - Continue amiodarone 200 mg daily. Check LFTs.  TFTs abnormal recently, check again today.  Will need regular eye exam.    - Continue Eliquis.  - With improvement in EF and concern for component of tachycardia-mediated CMP, I will send back to EP for consideration of atrial fibrillation ablation. 3. NSVT:    - On amiodarone.  4. CKD stage 3: BMET today.  5. CAD: PCI to LCx remotely.  LHC in 2/23 with nonobstructive CAD.  Wall motion abnormalities on echo today are consistent with LCx territory MI, as was the cardiac MRI.  - No ASA given Eliquis use.  - Continue atorvastatin, check lipids today.  6.  Thyroid abnormalities: 5/23 labs showed low TSH with high free T4 and low free T3.  He is, of note, on amiodarone.  - Refer back to endocrinology in [place] for assessment.   Followup in 6 wks with APP.   Loralie Champagne 07/30/2022

## 2022-07-30 NOTE — Telephone Encounter (Signed)
Advanced Heart Failure Patient Advocate Encounter  The patient was approved for a [place] that will help cover the cost of Entresto. Total amount awarded, $10,000. Eligibility, 06/30/22 - 06/30/23.  ID [id number]  BIN [id number]  PCN [id number]  Group [id number]  Patient was given a copy of the grant information to take to the pharmacy.  Charlann Boxer, CPhT

## 2022-07-31 LAB — T3, FREE: T3, Free: 1.8 pg/mL — ABNORMAL LOW (ref 2.0–4.4)

## 2022-07-31 NOTE — Telephone Encounter (Signed)
Advanced Heart Failure Patient Advocate Encounter  Sent in application via fax, 33/74. Document scanned to chart.   Will follow up.

## 2022-08-01 ENCOUNTER — Telehealth (HOSPITAL_COMMUNITY): Payer: Self-pay

## 2022-08-01 NOTE — Telephone Encounter (Addendum)
Spoke with wife and gave her instructions of medication changes. She verbalized changes to medication list. Med list updated to reflected changes    ----- Message from Larey Dresser, MD sent at 07/30/2022  4:22 PM EDT ----- Do NOT restart Entresto with higher creatinine.  Increase hydration.  OK to restart apixaban and Imdur.

## 2022-08-04 NOTE — Progress Notes (Unsigned)
Electrophysiology Office Follow up Visit Note:    Date:  08/04/2022   ID:  Ronald Mcdowell, DOB January 17, 1946, MRN [id number]  PCP:  Neale Burly, MD  [facility] Cardiologist:  Carlyle Dolly, MD  [facility] Electrophysiologist:  Vickie Epley, MD    Interval History:    Ronald Mcdowell is a 76 y.o. male who presents for a follow up visit. They were last seen in clinic Apr 16, 2022 by myself.  He is also followed by Dr. Aundra Dubin in the heart failure clinic.  He has a complex medical history that includes atrial fibrillation, coronary artery disease, ischemic cardiomyopathy, CKD, hypertension, hypothyroidism, hyperlipidemia, GERD and Graves' disease.  While in atrial fibrillation he felt poorly and had an ejection fraction less than 20%.  He has been in normal rhythm after a cardioversion and has had an improvement in his symptoms.  His ejection fraction has also improved to 35 to 40%.  He has previously worn a LifeVest.  He has previously been taking amiodarone for rhythm control.  The plan at our last appointment was to consider PVI if his ejection fraction demonstrated improvement while in normal rhythm.  He saw Dr. Aundra Dubin in follow-up July 30, 2022.  At that appointment he reported not taking Eliquis due to excessive costs.  At that appointment the LifeVest was discontinued.  He presents today to reconsider atrial fibrillation ablation.     Past Medical History:  Diagnosis Date   Anemia    Atrial fibrillation (HCC)    CHF (congestive heart failure) (HCC)    Chronic kidney disease, unspecified 01/14/2020   Coronary artery disease    a. h/o MI in 2000 w/ prior LCX stenting;  b. 7.2014 Abnl Cardiolite, EF 41%, mod-large inferolat scar;  c. 07/2013 Cath/PCI: LM nl, LAD 10-20, D1 40-50p, LCX 95-99 @ distal stent margin (2.5x20 Promus Premier DES), RCA dom, 40p, 85m 70d, EF 35-40%.   Elevated cholesterol    GERD (gastroesophageal reflux disease)    Graves disease 04/2020    s/p I-131 thyroid ablation    Hemorrhoids    Hypertension    Hyperthyroidism 09/27/2020   Ischemic cardiomyopathy    a. 07/2013 EF 35-40% by LV gram.; EF 50% on ECHO in November 2020   Myocardial infarction ([facility]    Reflux esophagitis    Spondylosis of cervical joint    C3-4, C6-7   Tobacco user    Vitamin D deficiency disease 08/24/2019    Past Surgical History:  Procedure Laterality Date   BIOPSY  01/09/2018   Procedure: BIOPSY;  Surgeon: RDaneil Dolin MD;  [facility];  Service: Endoscopy;;  esophageal biopsies   CARDIAC CATHETERIZATION     CARDIOVERSION N/A 02/18/2022   Procedure: CARDIOVERSION;  Surgeon: MLarey Dresser MD;  Location: [facility]  Service: Cardiovascular;  Laterality: N/A;   COLONOSCOPY     COLONOSCOPY N/A 01/25/2016   RMR: normal ileocolonoscopy ( anal canal hemorrhoids)   CORONARY ANGIOPLASTY WITH STENT PLACEMENT  08/23/2013   mid circumflex  DES    by Dr JMartinique  CORONARY STENT PLACEMENT  2000   ESOPHAGOGASTRODUODENOSCOPY N/A 01/25/2016   RMR: Reflux esophagitits. Cervical web with passage of the scope. Status post esophageal biosy. Hiatal hernia.    ESOPHAGOGASTRODUODENOSCOPY N/A 01/09/2018   Rourk: cervical/proximal esophageal web s/p dilation, markedly abnormal esophagus c/w biopsy proven eosinophilic esophagitis. small hiatal hernia.   ESOPHAGOGASTRODUODENOSCOPY N/A 05/31/2020   Procedure: ESOPHAGOGASTRODUODENOSCOPY (EGD);  Surgeon: RDaneil Dolin MD;  [facility];  Service: Endoscopy;  Laterality: N/A;  8:30am   LEFT HEART CATHETERIZATION WITH CORONARY ANGIOGRAM N/A 08/23/2013   Procedure: LEFT HEART CATHETERIZATION WITH CORONARY ANGIOGRAM;  Surgeon: Peter M Martinique, MD;  Location: [facility] CATH LAB;  Service: Cardiovascular;  Laterality: N/A;   MALONEY DILATION N/A 01/09/2018   Procedure: [name] DILATION;  Surgeon: Daneil Dolin, MD;  [facility];  Service: Endoscopy;  Laterality: N/A;   MALONEY DILATION N/A 05/31/2020    Procedure: [name] DILATION;  Surgeon: Daneil Dolin, MD;  [facility];  Service: Endoscopy;  Laterality: N/A;   RIGHT/LEFT HEART CATH AND CORONARY ANGIOGRAPHY N/A 01/16/2022   Procedure: RIGHT/LEFT HEART CATH AND CORONARY ANGIOGRAPHY;  Surgeon: Belva Crome, MD;  Location: [place] CV LAB;  Service: Cardiovascular;  Laterality: N/A;   TEE WITHOUT CARDIOVERSION N/A 02/18/2022   Procedure: TRANSESOPHAGEAL ECHOCARDIOGRAM (TEE);  Surgeon: Larey Dresser, MD;  Location: [facility] ENDOSCOPY;  Service: Cardiovascular;  Laterality: N/A;   TRANSURETHRAL RESECTION OF PROSTATE  01/09/2012   Procedure: TRANSURETHRAL RESECTION OF THE PROSTATE (TURP);  Surgeon: Marissa Nestle, MD;  Location: [facility];  Service: Urology;  Laterality: N/A;    Current Medications: No outpatient medications have been marked as taking for the 08/05/22 encounter (Appointment) with Vickie Epley, MD.     Allergies:   Peanut butter flavor   Social History   Socioeconomic History   Marital status: Married    Spouse name: [name]   Number of children: Not on file   Years of education: 11th grade   Highest education level: 11th grade  Occupational History   Occupation: Librarian, academic  Tobacco Use   Smoking status: Every Day    Packs/day: 1.00    Years: 53.00    Total pack years: 53.00    Types: Cigarettes    Start date: 12/09/1965   Smokeless tobacco: Never   Tobacco comments:    3/4 pack of cigarettes daily; smoked since 76 years old  Vaping Use   Vaping Use: Never used  Substance and Sexual Activity   Alcohol use: No    Alcohol/week: 0.0 standard drinks of alcohol    Comment: No bowel since year 2000.  Some previous heavy use.   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   Social Determinants of Health    Financial Resource Strain: Low Risk  (01/23/2022)   Overall Financial Resource Strain (CARDIA)    Difficulty of Paying Living Expenses: Not very hard  Food Insecurity: No Food Insecurity (01/23/2022)   Hunger Vital Sign    Worried About Running Out of Food in the Last Year: Never true    Ran Out of Food in the Last Year: Never true  Transportation Needs: No Transportation Needs (01/23/2022)   PRAPARE - Hydrologist (Medical): No    Lack of Transportation (Non-Medical): No  Physical Activity: Sufficiently Active (08/23/2019)   Exercise Vital Sign    Days of Exercise per Week: 5 days    Minutes of Exercise per Session: 30 min  Stress: No Stress Concern Present (08/23/2019)   [place]    Feeling of Stress : Not at all  Social Connections: Not on file  Family History: The patient's family history includes Cancer in his father; Early death in his mother; Heart attack in his father; Heart disease in his brother and brother. There is no history of Anesthesia problems, Hypotension, Malignant hyperthermia, Pseudochol deficiency, Colon cancer, Liver disease, Gastric cancer, or Esophageal cancer.  ROS:   Please see the history of present illness.    All other systems reviewed and are negative.  EKGs/Labs/Other Studies Reviewed:    The following studies were reviewed today:  July 30, 2022 echo EF 35 to 40% RV function normal Mildly dilated left atrium No MR  July 30, 2022 EKG shows sinus bradycardia    Recent Labs: 02/18/2022: Hemoglobin 13.3; Platelets 93 02/19/2022: Magnesium 2.4 02/27/2022: ALT 24; B Natriuretic Peptide 1,146.6 07/30/2022: BUN 32; Creatinine, Ser 2.40; Potassium 4.6; Sodium 141; TSH 1.552  Recent Lipid Panel    Component Value Date/Time   CHOL 126 02/17/2022 0343   TRIG 145 02/17/2022 0343   HDL 26 (L) 02/17/2022 0343   CHOLHDL 4.8 02/17/2022 0343   VLDL 29  02/17/2022 0343   LDLCALC 71 02/17/2022 0343   LDLCALC 87 01/30/2021 0821    Physical Exam:    VS:  There were no vitals taken for this visit.    Wt Readings from Last 3 Encounters:  07/30/22 137 lb 12.8 oz (62.5 kg)  04/24/22 138 lb 9.6 oz (62.9 kg)  04/16/22 139 lb 3.2 oz (63.1 kg)     GEN: *** Well nourished, well developed in no acute distress HEENT: Normal NECK: No JVD; No carotid bruits LYMPHATICS: No lymphadenopathy CARDIAC: ***RRR, no murmurs, rubs, gallops RESPIRATORY:  Clear to auscultation without rales, wheezing or rhonchi  ABDOMEN: Soft, non-tender, non-distended MUSCULOSKELETAL:  No edema; No deformity  SKIN: Warm and dry NEUROLOGIC:  Alert and oriented x 3 PSYCHIATRIC:  Normal affect        ASSESSMENT:    No diagnosis found. PLAN:    In order of problems listed above:  #Persistent atrial fibrillation Complicated by tachycardia mediated cardiomyopathy.  EF is improved now that he is maintaining sinus rhythm on amiodarone. On Eliquis for stroke prophylaxis but endorses not taking it in the past due to excessive cost.  Discussed treatment options today for his AF including antiarrhythmic drug therapy and ablation. Discussed risks, recovery and likelihood of success. Discussed potential need for repeat ablation procedures and antiarrhythmic drugs after an initial ablation. They wish to proceed with scheduling.  Risk, benefits, and alternatives to EP study and radiofrequency ablation for afib were also discussed in detail today. These risks include but are not limited to stroke, bleeding, vascular damage, tamponade, perforation, damage to the esophagus, lungs, and other structures, pulmonary vein stenosis, worsening renal function, and death. The patient understands these risk and wishes to proceed.  We will therefore proceed with catheter ablation at the next available time.  Carto, ICE, anesthesia are requested for the procedure.  He will need a transesophageal  echo prior to the procedure.  We will need to avoid CT scan given abnormal creatinine.   ------------------------  I have seen Ronald Mcdowell in the office today who is being considered for a Watchman left atrial appendage closure device. I believe they will benefit from this procedure given their history of atrial fibrillation, CHA2DS2-VASc score of *** and unadjusted ischemic stroke rate of ***% per year. Unfortunately, the patient is not felt to be a long term anticoagulation candidate secondary to ***. The patient's chart has been reviewed and I feel that  they would be a candidate for short term oral anticoagulation after Watchman implant.   It is my belief that after undergoing a LAA closure procedure, SPENCE SOBERANO will not need long term anticoagulation which eliminates anticoagulation side effects and major bleeding risk.   Procedural risks for the Watchman implant have been reviewed with the patient including a 0.5% risk of stroke, <1% risk of perforation and <1% risk of device embolization. Other risks include bleeding, vascular damage, tamponade, worsening renal function, and death. The patient understands these risk and wishes to proceed.     The published clinical data on the safety and effectiveness of WATCHMAN include but are not limited to the following: - Holmes DR, Mechele Claude, Sick P et al. for the PROTECT AF Investigators. Percutaneous closure of the left atrial appendage versus warfarin therapy for prevention of stroke in patients with atrial fibrillation: a randomised non-inferiority trial. Lancet 2009; 374: 534-42. Mechele Claude, Doshi SK, Abelardo Diesel D et al. on behalf of the PROTECT AF Investigators. Percutaneous Left Atrial Appendage Closure for Stroke Prophylaxis in Patients With Atrial Fibrillation 2.3-Year Follow-up of the PROTECT AF (Watchman Left Atrial Appendage System for Embolic Protection in Patients With Atrial Fibrillation) Trial. Circulation 2013; 127:720-729. -  Alli O, Doshi S,  Kar S, Reddy VY, Sievert H et al. Quality of Life Assessment in the Randomized PROTECT AF (Percutaneous Closure of the Left Atrial Appendage Versus Warfarin Therapy for Prevention of Stroke in Patients With Atrial Fibrillation) Trial of Patients at Risk for Stroke With Nonvalvular Atrial Fibrillation. J Am Coll Cardiol 2013; 71:6967-8. Vertell Limber DR, Tarri Abernethy, Price M, [place], Sievert H, Doshi S, Huber K, Reddy V. Prospective randomized evaluation of the Watchman left atrial appendage Device in patients with atrial fibrillation versus long-term warfarin therapy; the PREVAIL trial. Journal of the [employer] of Cardiology, Vol. 4, No. 1, 2014, 1-11. - Kar S, Doshi SK, Sadhu A, Horton R, Osorio J et al. Primary outcome evaluation of a next-generation left atrial appendage closure device: results from the PINNACLE FLX trial. Circulation 2021;143(18)1754-1762.    After today's visit with the patient which was dedicated solely for shared decision making visit regarding LAA closure device, the patient decided to proceed with the LAA appendage closure procedure scheduled to be done in the near future at [facility].  HAS-BLED score 2 Hypertension Yes  Abnormal renal and liver function (Dialysis, transplant, Cr >2.26 mg/dL /Cirrhosis or Bilirubin >2x Normal or AST/ALT/AP >3x Normal) No  Stroke No  Bleeding No  Labile INR (Unstable/high INR) No  Elderly (>65) Yes  Drugs or alcohol (? 8 drinks/week, anti-plt or NSAID) No   CHA2DS2-VASc Score = 5  The patient's score is based upon: CHF History: 1 HTN History: 1 Diabetes History: 0 Stroke History: 2 Vascular Disease History: 0 Age Score: 1 Gender Score: 0         Total time spent with patient today *** minutes. This includes reviewing records, evaluating the patient and coordinating care.   Medication Adjustments/Labs and Tests Ordered: Current medicines are reviewed at length with the patient today.  Concerns  regarding medicines are outlined above.  No orders of the defined types were placed in this encounter.  No orders of the defined types were placed in this encounter.    Signed, Lars Mage, MD, [facility], [facility] 08/04/2022 9:13 PM    Electrophysiology [facility]

## 2022-08-05 ENCOUNTER — Encounter: Payer: Self-pay | Admitting: Cardiology

## 2022-08-05 ENCOUNTER — Ambulatory Visit: Payer: Medicare HMO | Attending: Cardiology | Admitting: Cardiology

## 2022-08-05 ENCOUNTER — Encounter: Payer: Self-pay | Admitting: *Deleted

## 2022-08-05 VITALS — BP 138/70 | HR 54 | Ht 65.0 in | Wt 134.6 lb

## 2022-08-05 DIAGNOSIS — I5042 Chronic combined systolic (congestive) and diastolic (congestive) heart failure: Secondary | ICD-10-CM | POA: Diagnosis not present

## 2022-08-05 DIAGNOSIS — Z01818 Encounter for other preprocedural examination: Secondary | ICD-10-CM | POA: Diagnosis not present

## 2022-08-05 DIAGNOSIS — I4819 Other persistent atrial fibrillation: Secondary | ICD-10-CM | POA: Diagnosis not present

## 2022-08-05 DIAGNOSIS — Z79899 Other long term (current) drug therapy: Secondary | ICD-10-CM

## 2022-08-05 NOTE — Addendum Note (Signed)
Addended by: Lars Mage T on: 08/05/2022 01:53 PM   Modules accepted: Orders

## 2022-08-05 NOTE — Patient Instructions (Signed)
Medication Instructions: none *If you need a refill on your cardiac medications before your next appointment, please call your pharmacy*   Lab Work: Nov 13 anytime from 8 am to 4:30 pm, no fasting.  If you have labs (blood work) drawn today and your tests are completely normal, you will receive your results only by: [place] (if you have MyChart) OR A paper copy in the mail If you have any lab test that is abnormal or we need to change your treatment, we will call you to review the results.   Testing/Procedures: Your physician has requested that you have cardiac CT. Cardiac computed tomography (CT) is a painless test that uses an x-ray machine to take clear, detailed pictures of your heart. For further information please visit [email or web address]. Please follow instruction sheet as given.  Your physician has recommended that you have an ablation. Catheter ablation is a medical procedure used to treat some cardiac arrhythmias (irregular heartbeats). During catheter ablation, a long, thin, flexible tube is put into a blood vessel in your groin (upper thigh), or neck. This tube is called an ablation catheter. It is then guided to your heart through the blood vessel. Radio frequency waves destroy small areas of heart tissue where abnormal heartbeats may cause an arrhythmia to start. Please see the instruction sheet given to you today.    Your physician has requested that you have Left atrial appendage (LAA) closure device implantation is a procedure to put a small device in the LAA of the heart. The LAA is a small sac in the wall of the heart's left upper chamber. Blood clots can form in this area. The device, Watchman closes the LAA to help prevent a blood clot and stroke.    Follow-Up: At [facility], you and your health needs are our priority.  As part of our continuing mission to provide you with exceptional heart care, we have created designated Provider Care Teams.  These  Care Teams include your primary Cardiologist (physician) and Advanced Practice Providers (APPs -  Physician Assistants and Nurse Practitioners) who all work together to provide you with the care you need, when you need it.  We recommend signing up for the patient portal called "MyChart".  Sign up information is provided on this After Visit Summary.  MyChart is used to connect with patients for Virtual Visits (Telemedicine).  Patients are able to view lab/test results, encounter notes, upcoming appointments, etc.  Non-urgent messages can be sent to your provider as well.   To learn more about what you can do with MyChart, go to [email or web address].    Your next appointment:   Ablation date is Dec 4, pre op lab date is Nov 13. Otila Kluver RN will meet with you on your lab date with procedure and CT scan instructions.  Lenice Llamas, the Watchman Nurse Navigator, will call you after your CT once the [facility] Team has reviewed your imaging for an update on proceedings. [place]'s direct number is [phone] if you need assistance.   Other Instructions   Important Information About Sugar

## 2022-08-05 NOTE — Progress Notes (Addendum)
Electrophysiology Office Follow up Visit Note:    Date:  08/05/2022   ID:  Ronald Mcdowell, DOB 12/03/1945, MRN [id number]  PCP:  Neale Burly, MD  [facility] Cardiologist:  Carlyle Dolly, MD  [facility] Electrophysiologist:  Vickie Epley, MD    Interval History:    Ronald Mcdowell is a 76 y.o. male who presents for a follow up visit. They were last seen in clinic Apr 16, 2022 by myself.  He is also followed by Dr. Aundra Dubin in the heart failure clinic.   He has a complex medical history that includes atrial fibrillation, coronary artery disease, ischemic cardiomyopathy, CKD, hypertension, hypothyroidism, hyperlipidemia, GERD and Graves' disease.  While in atrial fibrillation he felt poorly and had an ejection fraction less than 20%.  He has been in normal rhythm after a cardioversion and has had an improvement in his symptoms.  His ejection fraction has also improved to 35 to 40%.  He has previously worn a LifeVest.  He has previously been taking amiodarone for rhythm control.  The plan at our last appointment was to consider PVI if his ejection fraction demonstrated improvement while in normal rhythm.   He saw Dr. Aundra Dubin in follow-up July 30, 2022.  At that appointment he reported not taking Eliquis due to excessive costs.  At that appointment the LifeVest was discontinued.  He presents today to reconsider atrial fibrillation ablation.  Today, he is accompanied by a family member. Overall he reports feeling well. He has been staying active.  He denies any palpitations, chest pain, shortness of breath, or peripheral edema. No lightheadedness, headaches, syncope, orthopnea, or PND.      Past Medical History:  Diagnosis Date   Anemia    Atrial fibrillation (HCC)    CHF (congestive heart failure) (HCC)    Chronic kidney disease, unspecified 01/14/2020   Coronary artery disease    a. h/o MI in 2000 w/ prior LCX stenting;  b. 7.2014 Abnl Cardiolite, EF 41%, mod-large  inferolat scar;  c. 07/2013 Cath/PCI: LM nl, LAD 10-20, D1 40-50p, LCX 95-99 @ distal stent margin (2.5x20 Promus Premier DES), RCA dom, 40p, 57m 70d, EF 35-40%.   Elevated cholesterol    GERD (gastroesophageal reflux disease)    Graves disease 04/2020   s/p I-131 thyroid ablation    Hemorrhoids    Hypertension    Hyperthyroidism 09/27/2020   Ischemic cardiomyopathy    a. 07/2013 EF 35-40% by LV gram.; EF 50% on ECHO in November 2020   [facility] ([facility]    Reflux esophagitis    Spondylosis of cervical joint    C3-4, C6-7   Tobacco user    Vitamin D deficiency disease 08/24/2019    Past Surgical History:  Procedure Laterality Date   BIOPSY  01/09/2018   Procedure: BIOPSY;  Surgeon: RDaneil Dolin MD;  [facility];  Service: Endoscopy;;  esophageal biopsies   CARDIAC CATHETERIZATION     CARDIOVERSION N/A 02/18/2022   Procedure: CARDIOVERSION;  Surgeon: MLarey Dresser MD;  [place]  Service: Cardiovascular;  Laterality: N/A;   COLONOSCOPY     COLONOSCOPY N/A 01/25/2016   RMR: normal ileocolonoscopy ( anal canal hemorrhoids)   CORONARY ANGIOPLASTY WITH STENT PLACEMENT  08/23/2013   mid circumflex  DES    by Dr JMartinique  CORONARY STENT PLACEMENT  2000   ESOPHAGOGASTRODUODENOSCOPY N/A 01/25/2016   RMR: Reflux esophagitits. Cervical web with passage of the scope. Status post esophageal biosy. Hiatal  hernia.    ESOPHAGOGASTRODUODENOSCOPY N/A 01/09/2018   Rourk: cervical/proximal esophageal web s/p dilation, markedly abnormal esophagus c/w biopsy proven eosinophilic esophagitis. small hiatal hernia.   ESOPHAGOGASTRODUODENOSCOPY N/A 05/31/2020   Procedure: ESOPHAGOGASTRODUODENOSCOPY (EGD);  Surgeon: Daneil Dolin, MD;  [facility];  Service: Endoscopy;  Laterality: N/A;  8:30am   LEFT HEART CATHETERIZATION WITH CORONARY ANGIOGRAM N/A 08/23/2013   Procedure: LEFT HEART CATHETERIZATION WITH CORONARY ANGIOGRAM;  Surgeon: Peter M Martinique, MD;   Location[facility] CATH LAB;  Service: Cardiovascular;  Laterality: N/A;   MALONEY DILATION N/A 01/09/2018   Procedure: [name] DILATION;  Surgeon: Daneil Dolin, MD;  [facility];  Service: Endoscopy;  Laterality: N/A;   MALONEY DILATION N/A 05/31/2020   Procedure: [name] DILATION;  Surgeon: Daneil Dolin, MD;  [facility];  Service: Endoscopy;  Laterality: N/A;   RIGHT/LEFT HEART CATH AND CORONARY ANGIOGRAPHY N/A 01/16/2022   Procedure: RIGHT/LEFT HEART CATH AND CORONARY ANGIOGRAPHY;  Surgeon: Belva Crome, MD;  Location: [place] CV LAB;  Service: Cardiovascular;  Laterality: N/A;   TEE WITHOUT CARDIOVERSION N/A 02/18/2022   Procedure: TRANSESOPHAGEAL ECHOCARDIOGRAM (TEE);  Surgeon: Larey Dresser, MD;  Location: [facility] ENDOSCOPY;  Service: Cardiovascular;  Laterality: N/A;   TRANSURETHRAL RESECTION OF PROSTATE  01/09/2012   Procedure: TRANSURETHRAL RESECTION OF THE PROSTATE (TURP);  Surgeon: Marissa Nestle, MD;  Location: [facility];  Service: Urology;  Laterality: N/A;    Current Medications: Current Meds  Medication Sig   amiodarone (PACERONE) 200 MG tablet Take 1 tablet (200 mg total) by mouth daily.   apixaban (ELIQUIS) 5 MG TABS tablet TAKE 1 TABLET(5 MG) BY MOUTH TWICE DAILY   atorvastatin (LIPITOR) 80 MG tablet Take 1 tablet (80 mg total) by mouth daily.   Cholecalciferol (VITAMIN D-3) 125 MCG (5000 UT) TABS Take 1 tablet by mouth daily.   digoxin (LANOXIN) 0.125 MG tablet Take 0.5 tablets (0.0625 mg total) by mouth daily.   hydrALAZINE (APRESOLINE) 25 MG tablet Take 1 tablet (25 mg total) by mouth 3 (three) times daily.   isosorbide mononitrate (IMDUR) 30 MG 24 hr tablet Take 1 tablet (30 mg total) by mouth daily.   nitroGLYCERIN (NITROSTAT) 0.4 MG SL tablet Place 1 tablet (0.4 mg total) under the tongue every 5 (five) minutes as needed for chest pain.   pantoprazole (PROTONIX) 40 MG tablet Take 1 tablet (40 mg total) by mouth daily.   potassium chloride  (KLOR-CON) 10 MEQ tablet Take 1 tablet (10 mEq total) by mouth daily.   spironolactone (ALDACTONE) 25 MG tablet Take 0.5 tablets (12.5 mg total) by mouth daily.     Allergies:   Peanut butter flavor   Social History   Socioeconomic History   Marital status: Married    Spouse name: [name]   Number of children: Not on file   Years of education: 11th grade   Highest education level: 11th grade  Occupational History   Occupation: Librarian, academic  Tobacco Use   Smoking status: Every Day    Packs/day: 1.00    Years: 53.00    Total pack years: 53.00    Types: Cigarettes    Start date: 12/09/1965   Smokeless tobacco: Never   Tobacco comments:    3/4 pack of cigarettes daily; smoked since 76 years old  Vaping Use   Vaping Use: Never used  Substance and Sexual Activity   Alcohol use: No    Alcohol/week: 0.0 standard drinks of alcohol    Comment:  No bowel since year 2000.  Some previous heavy use.   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   Social Determinants of Health   Financial Resource Strain: Low Risk  (01/23/2022)   Overall Financial Resource Strain (CARDIA)    Difficulty of Paying Living Expenses: Not very hard  Food Insecurity: No Food Insecurity (01/23/2022)   Hunger Vital Sign    Worried About Running Out of Food in the Last Year: Never true    Ran Out of Food in the Last Year: Never true  Transportation Needs: No Transportation Needs (01/23/2022)   PRAPARE - Hydrologist (Medical): No    Lack of Transportation (Non-Medical): No  Physical Activity: Sufficiently Active (08/23/2019)   Exercise Vital Sign    Days of Exercise per Week: 5 days    Minutes of Exercise per Session: 30 min  Stress: No Stress Concern Present (08/23/2019)   [place]    Feeling of Stress : Not at all  Social Connections: Not on file     Family History: The patient's family history includes Cancer in his father; Early death in his mother; Heart attack in his father; Heart disease in his brother and brother. There is no history of Anesthesia problems, Hypotension, Malignant hyperthermia, Pseudochol deficiency, Colon cancer, Liver disease, Gastric cancer, or Esophageal cancer.  ROS:   Please see the history of present illness.    All other systems reviewed and are negative.  EKGs/Labs/Other Studies Reviewed:    The following studies were reviewed today:  July 30, 2022 echo EF 35 to 40% RV function normal Mildly dilated left atrium No MR   July 30, 2022 EKG shows sinus bradycardia   EKG:  EKG is personally reviewed.  08/05/2022:  EKG was not ordered.   Recent Labs: 02/18/2022: Hemoglobin 13.3; Platelets 93 02/19/2022: Magnesium 2.4 02/27/2022: ALT 24; B Natriuretic Peptide 1,146.6 07/30/2022: BUN 32; Creatinine, Ser 2.40; Potassium 4.6; Sodium 141; TSH 1.552   Recent Lipid Panel    Component Value Date/Time   CHOL 126 02/17/2022 0343   TRIG 145 02/17/2022 0343   HDL 26 (L) 02/17/2022 0343   CHOLHDL 4.8 02/17/2022 0343   VLDL 29 02/17/2022 0343   LDLCALC 71 02/17/2022 0343   LDLCALC 87 01/30/2021 0821    Physical Exam:    VS:  BP 138/70   Pulse (!) 54   Ht '5\' 5"'$  (1.651 m)   Wt 134 lb 9.6 oz (61.1 kg)   SpO2 97%   BMI 22.40 kg/m     Wt Readings from Last 3 Encounters:  08/05/22 134 lb 9.6 oz (61.1 kg)  07/30/22 137 lb 12.8 oz (62.5 kg)  04/24/22 138 lb 9.6 oz (62.9 kg)     GEN: Well nourished, well developed in no acute distress HEENT: Normal NECK: No JVD; No carotid bruits LYMPHATICS: No lymphadenopathy CARDIAC: RRR, no murmurs, rubs, gallops RESPIRATORY:  Clear to auscultation without rales, wheezing or rhonchi  ABDOMEN: Soft, non-tender,  non-distended MUSCULOSKELETAL:  No edema; No deformity  SKIN: Warm and dry NEUROLOGIC:  Alert and oriented x 3 PSYCHIATRIC:  Normal affect        ASSESSMENT:    1. Chronic combined systolic and diastolic heart failure ([place])  2. Persistent atrial fibrillation ([place])   3. Encounter for long-term (current) use of high-risk medication    PLAN:    In order of problems listed above:  #Persistent atrial fibrillation Complicated by tachycardia mediated cardiomyopathy.  EF is improved now that he is maintaining sinus rhythm on amiodarone. On Eliquis for stroke prophylaxis but endorses not taking it in the past due to excessive cost.  It also seems that he may be taking the medication inappropriately at times in an effort to stretch his supply.   Discussed treatment options today for his AF including antiarrhythmic drug therapy and ablation. Discussed risks, recovery and likelihood of success. Discussed potential need for repeat ablation procedures and antiarrhythmic drugs after an initial ablation. They wish to proceed with scheduling.   Risk, benefits, and alternatives to EP study and radiofrequency ablation for afib were also discussed in detail today. These risks include but are not limited to stroke, bleeding, vascular damage, tamponade, perforation, damage to the esophagus, lungs, and other structures, pulmonary vein stenosis, worsening renal function, and death. The patient understands these risk and wishes to proceed.  We will therefore proceed with catheter ablation at the next available time.  Carto, ICE, anesthesia are requested for the procedure.  He will need a transesophageal echo prior to the procedure.  We will need to avoid CT scan given abnormal creatinine.   Plan to discontinue amiodarone long-term if he remains A-fib free after his ablation.     ------------------------   I have seen Ronald Mcdowell in the office today who is being considered for a Watchman left atrial  appendage closure device. I believe they will benefit from this procedure given their history of atrial fibrillation, CHA2DS2-VASc score of 5 and unadjusted ischemic stroke rate of 7.2% per year. Unfortunately, the patient is not felt to be a long term anticoagulation candidate secondary to prohibitive costs associated with AC and medication regimen noncompliance. The patient's chart has been reviewed and I feel that they would be a candidate for short term oral anticoagulation after Watchman implant.    It is my belief that after undergoing a LAA closure procedure, MASASHI SNOWDON will not need long term anticoagulation which eliminates anticoagulation side effects and major bleeding risk.    Procedural risks for the Watchman implant have been reviewed with the patient including a 0.5% risk of stroke, <1% risk of perforation and <1% risk of device embolization. Other risks include bleeding, vascular damage, tamponade, worsening renal function, and death. The patient understands these risk and wishes to proceed.       The published clinical data on the safety and effectiveness of WATCHMAN include but are not limited to the following: - Holmes DR, Mechele Claude, Sick P et al. for the PROTECT AF Investigators. Percutaneous closure of the left atrial appendage versus warfarin therapy for prevention of stroke in patients with atrial fibrillation: a randomised non-inferiority trial. Lancet 2009; 374: 534-42. Mechele Claude, Doshi SK, Abelardo Diesel D et al. on behalf of the PROTECT AF Investigators. Percutaneous Left Atrial Appendage Closure for Stroke Prophylaxis in Patients With Atrial Fibrillation 2.3-Year Follow-up of the PROTECT AF (Watchman Left Atrial Appendage System for Embolic Protection in Patients With Atrial Fibrillation) Trial. Circulation 2013; 127:720-729. - Alli O, Doshi S,  Kar S, Reddy VY, Sievert H et al. Quality of Life Assessment in the Randomized PROTECT AF (Percutaneous Closure of the Left  Atrial Appendage Versus Warfarin Therapy for Prevention of Stroke in Patients With Atrial Fibrillation)  Trial of Patients at Risk for Stroke With Nonvalvular Atrial Fibrillation. J Am Coll Cardiol 2013; 75:4492-0. Vertell Limber DR, Tarri Abernethy, Price M, [place], Sievert H, Doshi S, Huber K, Reddy V. Prospective randomized evaluation of the Watchman left atrial appendage Device in patients with atrial fibrillation versus long-term warfarin therapy; the PREVAIL trial. Journal of the [employer] of Cardiology, Vol. 4, No. 1, 2014, 1-11. - Kar S, Doshi SK, Sadhu A, Horton R, Osorio J et al. Primary outcome evaluation of a next-generation left atrial appendage closure device: results from the PINNACLE FLX trial. Circulation 2021;143(18)1754-1762.      After today's visit with the patient which was dedicated solely for shared decision making visit regarding LAA closure device, the patient decided to proceed with the LAA appendage closure procedure scheduled to be done in the near future at [facility]   HAS-BLED score 2 Hypertension Yes  Abnormal renal and liver function (Dialysis, transplant, Cr >2.26 mg/dL /Cirrhosis or Bilirubin >2x Normal or AST/ALT/AP >3x Normal) No  Stroke No  Bleeding No  Labile INR (Unstable/high INR) No  Elderly (>65) Yes  Drugs or alcohol (? 8 drinks/week, anti-plt or NSAID) No    CHA2DS2-VASc Score = 5  The patient's score is based upon: CHF History: 1 HTN History: 1 Diabetes History: 0 Stroke History: 2 Vascular Disease History: 0 Age Score: 1 Gender Score: 0     Medication Adjustments/Labs and Tests Ordered: Current medicines are reviewed at length with the patient today.  Concerns regarding medicines are outlined above.  No orders of the defined types were placed in this encounter.  No orders of the defined types were placed in this encounter.   I,Mathew Stumpf,acting as a Education administrator for Vickie Epley, MD.,have documented all relevant documentation on  the behalf of Vickie Epley, MD,as directed by  Vickie Epley, MD while in the presence of Vickie Epley, MD.  I, Vickie Epley, MD, have reviewed all documentation for this visit. The documentation on 08/05/22 for the exam, diagnosis, procedures, and orders are all accurate and complete.   Signed, Lars Mage, MD, [facility], [facility] 08/05/2022 11:28 AM    Electrophysiology  Medical Group [facility]

## 2022-08-16 ENCOUNTER — Ambulatory Visit: Payer: Medicare HMO | Admitting: Cardiology

## 2022-08-20 ENCOUNTER — Ambulatory Visit (INDEPENDENT_AMBULATORY_CARE_PROVIDER_SITE_OTHER): Payer: Medicare HMO | Admitting: "Endocrinology

## 2022-08-20 ENCOUNTER — Encounter: Payer: Self-pay | Admitting: "Endocrinology

## 2022-08-20 VITALS — BP 142/76 | HR 60 | Ht 65.0 in | Wt 138.6 lb

## 2022-08-20 DIAGNOSIS — F17200 Nicotine dependence, unspecified, uncomplicated: Secondary | ICD-10-CM

## 2022-08-20 DIAGNOSIS — E0590 Thyrotoxicosis, unspecified without thyrotoxic crisis or storm: Secondary | ICD-10-CM

## 2022-08-20 NOTE — Progress Notes (Signed)
08/20/2022                                   Endocrinology follow-up note    Subjective:    Patient ID: Ronald Mcdowell, male    DOB: 1946-11-03, PCP Neale Burly, MD.   Past Medical History:  Diagnosis Date   Anemia    Atrial fibrillation ([place])    CHF (congestive heart failure) ([place])    Chronic kidney disease, unspecified 01/14/2020   Coronary artery disease    a. h/o MI in 2000 w/ prior LCX stenting;  b. 7.2014 Abnl Cardiolite, EF 41%, mod-large inferolat scar;  c. 07/2013 Cath/PCI: LM nl, LAD 10-20, D1 40-50p, LCX 95-99 @ distal stent margin (2.5x20 Promus Premier DES), RCA dom, 40p, 33m 70d, EF 35-40%.   Elevated cholesterol    GERD (gastroesophageal reflux disease)    Graves disease 04/2020   s/p I-131 thyroid ablation    Hemorrhoids    Hypertension    Hyperthyroidism 09/27/2020   Ischemic cardiomyopathy    a. 07/2013 EF 35-40% by LV gram.; EF 50% on ECHO in November 2020   Myocardial infarction ([facility]    Reflux esophagitis    Spondylosis of cervical joint    C3-4, C6-7   Tobacco user    Vitamin D deficiency disease 08/24/2019    Past Surgical History:  Procedure Laterality Date   BIOPSY  01/09/2018   Procedure: BIOPSY;  Surgeon: RDaneil Dolin MD;  [facility];  Service: Endoscopy;;  esophageal biopsies   CARDIAC CATHETERIZATION     CARDIOVERSION N/A 02/18/2022   Procedure: CARDIOVERSION;  Surgeon: MLarey Dresser MD;  [facility]  Service: Cardiovascular;  Laterality: N/A;   COLONOSCOPY     COLONOSCOPY N/A 01/25/2016   RMR: normal ileocolonoscopy ( anal canal hemorrhoids)   CORONARY ANGIOPLASTY WITH STENT PLACEMENT  08/23/2013   mid circumflex  DES    by Dr JMartinique  CORONARY STENT PLACEMENT  2000   ESOPHAGOGASTRODUODENOSCOPY N/A 01/25/2016   RMR: Reflux esophagitits. Cervical web with passage of the scope. Status post esophageal biosy. Hiatal hernia.    ESOPHAGOGASTRODUODENOSCOPY N/A 01/09/2018   Rourk: cervical/proximal  esophageal web s/p dilation, markedly abnormal esophagus c/w biopsy proven eosinophilic esophagitis. small hiatal hernia.   ESOPHAGOGASTRODUODENOSCOPY N/A 05/31/2020   Procedure: ESOPHAGOGASTRODUODENOSCOPY (EGD);  Surgeon: RDaneil Dolin MD;  [facility];  Service: Endoscopy;  Laterality: N/A;  8:30am   LEFT HEART CATHETERIZATION WITH CORONARY ANGIOGRAM N/A 08/23/2013   Procedure: LEFT HEART CATHETERIZATION WITH CORONARY ANGIOGRAM;  Surgeon: Peter M JMartinique MD;  [facility]  Service: Cardiovascular;  Laterality: N/A;   MALONEY DILATION N/A 01/09/2018   Procedure: MVenia MinksDILATION;  Surgeon: RDaneil Dolin MD;  [facility];  Service: Endoscopy;  Laterality: N/A;   MALONEY DILATION N/A 05/31/2020   Procedure: MVenia MinksDILATION;  Surgeon: RDaneil Dolin MD;  [facility];  Service: Endoscopy;  Laterality: N/A;   RIGHT/LEFT HEART CATH AND CORONARY ANGIOGRAPHY N/A 01/16/2022   Procedure: RIGHT/LEFT HEART CATH AND CORONARY ANGIOGRAPHY;  Surgeon: SBelva Crome MD;  [facility];  Service: Cardiovascular;  Laterality: N/A;   TEE WITHOUT CARDIOVERSION N/A 02/18/2022   Procedure: TRANSESOPHAGEAL ECHOCARDIOGRAM (TEE);  Surgeon: MLarey Dresser MD;  [facility]  Service: Cardiovascular;  Laterality: N/A;   TRANSURETHRAL RESECTION OF PROSTATE  01/09/2012  Procedure: TRANSURETHRAL RESECTION OF THE PROSTATE (TURP);  Surgeon: Marissa Nestle, MD;  Location: [facility];  Service: Urology;  Laterality: N/A;    Social History   Socioeconomic History   Marital status: Married    Spouse name: [name]   Number of children: Not on file   Years of education: 11th grade   Highest education level: 11th grade  Occupational History   Occupation: Librarian, academic  Tobacco Use   Smoking status: Every Day    Packs/day: 1.00    Years: 53.00    Total pack years: 53.00    Types: Cigarettes    Start date: 12/09/1965   Smokeless tobacco: Never   Tobacco  comments:    3/4 pack of cigarettes daily; smoked since 76 years old  Vaping Use   Vaping Use: Never used  Substance and Sexual Activity   Alcohol use: No    Alcohol/week: 0.0 standard drinks of alcohol    Comment: No bowel since year 2000.  Some previous heavy use.   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   Social Determinants of Health   Financial Resource Strain: Low Risk  (01/23/2022)   Overall Financial Resource Strain (CARDIA)    Difficulty of Paying Living Expenses: Not very hard  Food Insecurity: No Food Insecurity (01/23/2022)   Hunger Vital Sign    Worried About Running Out of Food in the Last Year: Never true    Ran Out of Food in the Last Year: Never true  Transportation Needs: No Transportation Needs (01/23/2022)   PRAPARE - Hydrologist (Medical): No    Lack of Transportation (Non-Medical): No  Physical Activity: Sufficiently Active (08/23/2019)   Exercise Vital Sign    Days of Exercise per Week: 5 days    Minutes of Exercise per Session: 30 min  Stress: No Stress Concern Present (08/23/2019)   [place]    Feeling of Stress : Not at all  Social Connections: Not on file    Family History  Problem Relation Age of Onset   Early death Mother        childbirth   Heart attack Father    Cancer Father    Heart disease Brother        CABG   Heart disease Brother        slight heart attack   Anesthesia problems Neg Hx    Hypotension Neg Hx    Malignant hyperthermia Neg Hx    Pseudochol deficiency Neg Hx    Colon cancer Neg Hx    Liver disease Neg Hx    Gastric cancer Neg Hx    Esophageal cancer Neg Hx     Outpatient Encounter Medications as of 08/20/2022  Medication Sig    amiodarone (PACERONE) 200 MG tablet Take 1 tablet (200 mg total) by mouth daily.   apixaban (ELIQUIS) 5 MG TABS tablet TAKE 1 TABLET(5 MG) BY MOUTH TWICE DAILY   atorvastatin (LIPITOR) 80 MG tablet Take 1 tablet (80 mg total) by mouth daily.   Cholecalciferol (VITAMIN D-3) 125 MCG (5000 UT) TABS Take 1 tablet by mouth once a week.   digoxin (LANOXIN) 0.125 MG tablet Take 0.5 tablets (0.0625 mg total) by mouth daily.   hydrALAZINE (APRESOLINE)  25 MG tablet Take 1 tablet (25 mg total) by mouth 3 (three) times daily.   isosorbide mononitrate (IMDUR) 30 MG 24 hr tablet Take 1 tablet (30 mg total) by mouth daily.   nitroGLYCERIN (NITROSTAT) 0.4 MG SL tablet Place 1 tablet (0.4 mg total) under the tongue every 5 (five) minutes as needed for chest pain.   pantoprazole (PROTONIX) 40 MG tablet Take 1 tablet (40 mg total) by mouth daily.   potassium chloride (KLOR-CON) 10 MEQ tablet Take 1 tablet (10 mEq total) by mouth daily.   spironolactone (ALDACTONE) 25 MG tablet Take 0.5 tablets (12.5 mg total) by mouth daily.   No facility-administered encounter medications on file as of 08/20/2022.    ALLERGIES: Allergies  Allergen Reactions   Peanut Butter Flavor Swelling    VACCINATION STATUS: Immunization History  Administered Date(s) Administered   Fluad Quad(high Dose 65+) 12/04/2020   Influenza Split 09/13/2019   Influenza,inj,Quad PF,6+ Mos 08/24/2013, 11/04/2017   Influenza-Unspecified 10/14/2009, 12/07/2010, 09/26/2019   Moderna Sars-Covid-2 Vaccination 02/15/2020, 03/14/2020, 09/27/2020   Pneumococcal Conjugate-13 04/22/2019   Pneumococcal Polysaccharide-23 12/25/2007   Td 02/23/2017     HPI  DELMA VILLALVA is 76 y.o. male who presents today with a medical history as above. he was seen previously for hyperthyroidism which required radioactive iodine thyroid ablation with subsequent hypothyroidism.  He was supposed to return for follow-up, however did not show up since November 2021.  he  was treated with I-131 thyroid ablation for Graves' disease on May 09, 2020.  He reports significant improvement in his prior symptoms.  He has maintained the 35+ pounds regained, which is a good development for him.    He sleeps better, has consistent energy.  He denies palpitations, tremors, nor heat intolerance.  He does not have new complaints today. -  His previsit thyroid function tests are consistent with treatment response/effect, however not hypothyroid yet.  he denies dysphagia, choking, shortness of breath, no recent voice change.    he denies family history of thyroid dysfunction, denies family hx of thyroid cancer.  He denies any exposure to amiodarone. he denies personal history of goiter.                           Review of systems Limited as above.   Objective:    BP (!) 142/76   Pulse 60   Ht '5\' 5"'$  (1.651 m)   Wt 138 lb 9.6 oz (62.9 kg)   BMI 23.06 kg/m   Wt Readings from Last 3 Encounters:  08/20/22 138 lb 9.6 oz (62.9 kg)  08/05/22 134 lb 9.6 oz (61.1 kg)  07/30/22 137 lb 12.8 oz (62.5 kg)                                             CMP     Component Value Date/Time   NA 141 07/30/2022 1247   K 4.6 07/30/2022 1247   CL 109 07/30/2022 1247   CO2 20 (L) 07/30/2022 1247   GLUCOSE 99 07/30/2022 1247   BUN 32 (H) 07/30/2022 1247   CREATININE 2.40 (H) 07/30/2022 1247   CREATININE 1.52 (H) 12/04/2020 1434   CALCIUM 9.3 07/30/2022 1247   PROT 7.5 02/27/2022 0941   ALBUMIN 3.9 02/27/2022 0941   AST 26 02/27/2022 0941   ALT 24 02/27/2022 0941  ALKPHOS 45 02/27/2022 0941   BILITOT 1.0 02/27/2022 0941   GFRNONAA 28 (L) 07/30/2022 1247   GFRNONAA 45 (L) 12/04/2020 1434   GFRAA 52 (L) 12/04/2020 1434     CBC    Component Value Date/Time   WBC 9.7 02/18/2022 1150   RBC 4.38 02/18/2022 1150   HGB 13.3 02/18/2022 1150   HCT 39.7 02/18/2022 1150   PLT 93 (L) 02/18/2022 1150   MCV 90.6 02/18/2022 1150   MCH 30.4 02/18/2022 1150   MCHC 33.5 02/18/2022  1150   RDW 14.7 02/18/2022 1150   LYMPHSABS 2.4 01/17/2022 0426   MONOABS 1.0 01/17/2022 0426   EOSABS 0.2 01/17/2022 0426   BASOSABS 0.0 01/17/2022 0426     Diabetic Labs (most recent): Lab Results  Component Value Date   HGBA1C 5.9 (H) 04/10/2015   HGBA1C 5.5 06/06/2013    Lipid Panel     Component Value Date/Time   CHOL 126 02/17/2022 0343   TRIG 145 02/17/2022 0343   HDL 26 (L) 02/17/2022 0343   CHOLHDL 4.8 02/17/2022 0343   VLDL 29 02/17/2022 0343   LDLCALC 71 02/17/2022 0343   LDLCALC 87 01/30/2021 0821     Lab Results  Component Value Date   TSH 1.552 07/30/2022   TSH 0.087 (L) 02/27/2022   TSH 0.01 (L) 12/04/2020   TSH <0.005 (L) 10/31/2020   TSH <0.01 (L) 09/07/2020   TSH <0.01 (L) 08/07/2020   TSH <0.01 (L) 07/04/2020   TSH 0.01 (L) 03/14/2020   TSH 0.674 04/10/2015   TSH 0.455 06/06/2013   FREET4 1.00 07/30/2022   FREET4 1.13 (H) 04/24/2022   FREET4 1.6 12/04/2020   FREET4 1.77 10/31/2020   FREET4 1.2 09/07/2020   FREET4 1.4 08/07/2020   FREET4 2.4 (H) 07/04/2020    Thyroid uptake and scan on April 27, 2020 FINDINGS: Homogeneous tracer distribution in both thyroid lobes.   No focal areas of increased or decreased tracer localization.   4 hour I-123 uptake = 41% (normal 5-20%)  ,  24 hour I-123 uptake = 72% (normal 10-30%)   IMPRESSION: Normal thyroid scan.   Elevated 4 hour and 24 hour radio iodine uptakes consistent with hyperthyroidism.   Overall findings consistent with Graves disease.   May 09, 2020 nuclear medicine IMPRESSION: Per oral administration of I-131 sodium iodide for the treatment of hyperthyroidism.    Assessment & Plan:   1. Hyperthyroidism  2.  Graves' disease  -He is status post I-131 therapy for hyperthyroidism from Graves' disease on May 09, 2020.  He was not seen since November 2021, however he remains euthyroid.  He did notes developed hypothyroidism following RAI  thyroid ablation.  He will be continued on  observation only. -Patient is made aware of the high likelihood of post ablative hypothyroidism with subsequent need for lifelong thyroid hormone replacement.  His pulse rate is controlled at 60--80, on beta-blocker. -He continues to smoke, advised on smoking cessation.   The patient was counseled on the dangers of tobacco use, and was advised to quit.  Reviewed strategies to maximize success, including removing cigarettes and smoking materials from environment.  His  recently slightly elevated PTH is due to his CKD.  -I did not initiate any prescription for him today.  -Patient is advised to maintain close follow up with Hasanaj, Samul Dada, MD for primary care needs.   I spent 25 minutes in the care of the patient today including review of labs from Thyroid Function, CMP, and other  relevant labs ; imaging/biopsy records (current and previous including abstractions from other facilities); face-to-face time discussing  his lab results and symptoms, medications doses, his options of short and long term treatment based on the latest standards of care / guidelines;   and documenting the encounter.  Ronald Mcdowell  participated in the discussions, expressed understanding, and voiced agreement with the above plans.  All questions were answered to his satisfaction. he is encouraged to contact clinic should he have any questions or concerns prior to his return visit.    Follow up plan: Return in about 6 months (around 02/18/2023) for F/U with Pre-visit Labs.   Thank you for involving me in the care of this pleasant patient, and I will continue to update you with his progress.  Glade Lloyd, MD [facility] Phone: [phone]  Fax: [phone]   08/20/2022, 5:20 PM  This note was partially dictated with voice recognition software. Similar sounding words can be transcribed inadequately or may not  be corrected upon review.

## 2022-09-09 ENCOUNTER — Encounter (HOSPITAL_COMMUNITY): Payer: Medicare HMO

## 2022-09-19 NOTE — Telephone Encounter (Signed)
Advanced Heart Failure Patient Advocate Encounter  Contacted [employer] for application update. OOP was never submitted. The income on the application and the income that [employer] pulled electronically are different. Patient will need to show approximately $1200 in OOP expenses, or will need to submit POI along with OOP.  No answer when I called, left voicemail.  Clista Bernhardt, CPhT Rx Patient Advocate Phone: [phone]

## 2022-09-20 ENCOUNTER — Telehealth (HOSPITAL_COMMUNITY): Payer: Self-pay | Admitting: Cardiology

## 2022-09-20 NOTE — Telephone Encounter (Signed)
Verbal order given to d/c [facility] [phone]

## 2022-09-25 ENCOUNTER — Other Ambulatory Visit: Payer: Self-pay

## 2022-09-26 ENCOUNTER — Telehealth: Payer: Self-pay | Admitting: Cardiology

## 2022-09-26 ENCOUNTER — Telehealth: Payer: Self-pay

## 2022-09-26 MED ORDER — APIXABAN 5 MG PO TABS: ORAL_TABLET | ORAL | 1 refills | Status: DC

## 2022-09-26 NOTE — Telephone Encounter (Signed)
Prescription refill request for Eliquis received. Indication: Last office visit: 08/05/22  Grayce Sessions MD Scr: 2.40 on 07/30/22  Age: 76 Weight: 61.1kg  Based on above findings Eliquis '5mg'$  twice daily is the appropriate dose.  Refill approved.

## 2022-09-26 NOTE — Telephone Encounter (Signed)
*  STAT* If patient is at the pharmacy, call can be transferred to refill team.   1. Which medications need to be refilled? (please list name of each medication and dose if known) apixaban (ELIQUIS) 5 MG TABS tablet   2. Which pharmacy/location (including street and city if local pharmacy) is medication to be sent to?  [place], [place] - [place]    3. Do they need a 30 day or 90 day supply? 30 day   Patient has 1 day left

## 2022-09-26 NOTE — Telephone Encounter (Signed)
Pt is scheduled for an Afib Ablation on 12/4 - he is not a candidate for CT due to elevated Creatinine. Dr Quentin Ore recommended TEE for the pt on the table prior to the Ablation. This was not convenient for the pt so he agreed to have the TEE on 11/28 '@10'$ :00 with Dr. Johnsie Cancel.   Pt is scheduled to see Tommye Standard, PA on 10/21/22 and will have his EKG, pre-procedure labs and get his Instruction letters at this time.   Pt is aware of all upcoming appts.  11/27 - appt with Renee - labwork 11/28 - TEE 12/4 - Ablation

## 2022-10-01 NOTE — Telephone Encounter (Signed)
Advanced Heart Failure Patient Advocate Encounter  Called and spoke with the patient's wife. She will fax over OOP once she is able to get it from the pharmacy.  Will follow up.

## 2022-10-07 ENCOUNTER — Other Ambulatory Visit: Payer: Medicare HMO

## 2022-10-18 NOTE — Progress Notes (Addendum)
Cardiology Office Note Date:  10/18/2022  Patient ID:  Ronald Mcdowell 19-Sep-1946, MRN [id number] PCP:  Neale Burly, MD  Cardiology: Dr. Harl Bowie HF:  Dr. Aundra Dubin Electrophysiologist: Dr. Quentin Ore    Chief Complaint:  pre-ablation  History of Present Illness: Ronald Mcdowell is a 76 y.o. male with history of CAD (PCI 2000, 2014) CKD (III), recurrent UTIs, HTN, Afib, Graves dz (s/p iodine thyroid ablation), NSVT, NICM (primarily NICM, etiology unclear), chronic CHF (systolic).  Poor medication compliance 2/2 financial restrictions  He saw Dr. Quentin Ore 08/05/22, for consideration of AFib ablation with a potential of tachy-mediated CM, as well as symptomatic Afib.  Planned to proceed.  Unable to get cardiac CT 2/2 his renal function. Planned for TEE pre-procedure, scheduled for 10/22/22 w/Dr. Johnsie Cancel Ablation scheduled for 10/28/22  TODAY He comes accompanied by his wife. He is doing well, feels well. No CP, palpitations SOB No near syncope or syncope.  Eliquis compliance remains problematic 2/2 cost    AFib/AAD hx Dx Sept 2022 Amiodarone started march 2023 NOTE: thyroid issues pre-date amiodarone  Past Medical History:  Diagnosis Date   Anemia    Atrial fibrillation (HCC)    CHF (congestive heart failure) (HCC)    Chronic kidney disease, unspecified 01/14/2020   Coronary artery disease    a. h/o MI in 2000 w/ prior LCX stenting;  b. 7.2014 Abnl Cardiolite, EF 41%, mod-large inferolat scar;  c. 07/2013 Cath/PCI: LM nl, LAD 10-20, D1 40-50p, LCX 95-99 @ distal stent margin (2.5x20 Promus Premier DES), RCA dom, 40p, 16m 70d, EF 35-40%.   Elevated cholesterol    GERD (gastroesophageal reflux disease)    Graves disease 04/2020   s/p I-131 thyroid ablation    Hemorrhoids    Hypertension    Hyperthyroidism 09/27/2020   Ischemic cardiomyopathy    a. 07/2013 EF 35-40% by LV gram.; EF 50% on ECHO in November 2020   Myocardial infarction ([facility]    Reflux esophagitis     Spondylosis of cervical joint    C3-4, C6-7   Tobacco user    Vitamin D deficiency disease 08/24/2019    Past Surgical History:  Procedure Laterality Date   BIOPSY  01/09/2018   Procedure: BIOPSY;  Surgeon: RDaneil Dolin MD;  [facility];  Service: Endoscopy;;  esophageal biopsies   CARDIAC CATHETERIZATION     CARDIOVERSION N/A 02/18/2022   Procedure: CARDIOVERSION;  Surgeon: MLarey Dresser MD;  [facility] [facility]  Service: Cardiovascular;  Laterality: N/A;   COLONOSCOPY     COLONOSCOPY N/A 01/25/2016   RMR: normal ileocolonoscopy ( anal canal hemorrhoids)   CORONARY ANGIOPLASTY WITH STENT PLACEMENT  08/23/2013   mid circumflex  DES    by Dr JMartinique  CORONARY STENT PLACEMENT  2000   ESOPHAGOGASTRODUODENOSCOPY N/A 01/25/2016   RMR: Reflux esophagitits. Cervical web with passage of the scope. Status post esophageal biosy. Hiatal hernia.    ESOPHAGOGASTRODUODENOSCOPY N/A 01/09/2018   Rourk: cervical/proximal esophageal web s/p dilation, markedly abnormal esophagus c/w biopsy proven eosinophilic esophagitis. small hiatal hernia.   ESOPHAGOGASTRODUODENOSCOPY N/A 05/31/2020   Procedure: ESOPHAGOGASTRODUODENOSCOPY (EGD);  Surgeon: RDaneil Dolin MD;  [facility];  Service: Endoscopy;  Laterality: N/A;  8:30am   LEFT HEART CATHETERIZATION WITH CORONARY ANGIOGRAM N/A 08/23/2013   Procedure: LEFT HEART CATHETERIZATION WITH CORONARY ANGIOGRAM;  Surgeon: Peter M JMartinique MD;  [facility];  Service: Cardiovascular;  Laterality: N/A;   MALONEY DILATION N/A 01/09/2018  Procedure: MALONEY DILATION;  Surgeon: Daneil Dolin, MD;  [facility];  Service: Endoscopy;  Laterality: N/A;   MALONEY DILATION N/A 05/31/2020   Procedure: [name] DILATION;  Surgeon: Daneil Dolin, MD;  [facility];  Service: Endoscopy;  Laterality: N/A;   RIGHT/LEFT HEART CATH AND CORONARY ANGIOGRAPHY N/A 01/16/2022   Procedure: RIGHT/LEFT HEART CATH AND CORONARY  ANGIOGRAPHY;  Surgeon: Belva Crome, MD;  Location: [place] [facility];  Service: Cardiovascular;  Laterality: N/A;   TEE WITHOUT CARDIOVERSION N/A 02/18/2022   Procedure: TRANSESOPHAGEAL ECHOCARDIOGRAM (TEE);  Surgeon: Larey Dresser, MD;  [facility] END[facility];  Service: Cardiovascular;  Laterality: N/A;   TRANSURETHRAL RESECTION OF PROSTATE  01/09/2012   Procedure: TRANSURETHRAL RESECTION OF THE PROSTATE (TURP);  Surgeon: Marissa Nestle, MD;  Location: [facility];  Service: Urology;  Laterality: N/A;    Current Outpatient Medications  Medication Sig Dispense Refill   amiodarone (PACERONE) 200 MG tablet Take 1 tablet (200 mg total) by mouth daily. 90 tablet 2   apixaban (ELIQUIS) 5 MG TABS tablet TAKE 1 TABLET(5 MG) BY MOUTH TWICE DAILY 180 tablet 1   atorvastatin (LIPITOR) 80 MG tablet Take 1 tablet (80 mg total) by mouth daily. 90 tablet 3   Cholecalciferol (VITAMIN D-3) 125 MCG (5000 UT) TABS Take 1 tablet by mouth once a week.     digoxin (LANOXIN) 0.125 MG tablet Take 0.5 tablets (0.0625 mg total) by mouth daily. 45 tablet 2   hydrALAZINE (APRESOLINE) 25 MG tablet Take 1 tablet (25 mg total) by mouth 3 (three) times daily. 270 tablet 3   isosorbide mononitrate (IMDUR) 30 MG 24 hr tablet Take 1 tablet (30 mg total) by mouth daily. 90 tablet 3   nitroGLYCERIN (NITROSTAT) 0.4 MG SL tablet Place 1 tablet (0.4 mg total) under the tongue every 5 (five) minutes as needed for chest pain. 30 tablet 3   pantoprazole (PROTONIX) 40 MG tablet Take 1 tablet (40 mg total) by mouth daily. 90 tablet 3   potassium chloride (KLOR-CON) 10 MEQ tablet Take 1 tablet (10 mEq total) by mouth daily. 30 tablet 5   spironolactone (ALDACTONE) 25 MG tablet Take 0.5 tablets (12.5 mg total) by mouth daily. 45 tablet 2   No current facility-administered medications for this visit.    Allergies:   Peanut butter flavor   Social History:  The patient  reports that he has been smoking cigarettes. He started smoking  about 56 years ago. He has a 53.00 pack-year smoking history. He has never used smokeless tobacco. He reports that he does not drink alcohol and does not use drugs.   Family History:  The patient's family history includes Cancer in his father; Early death in his mother; Heart attack in his father; Heart disease in his brother and brother.  ROS:  Please see the history of present illness.    All other systems are reviewed and otherwise negative.   PHYSICAL EXAM:  VS:  There were no vitals taken for this visit. BMI: There is no height or weight on file to calculate BMI. Well nourished, well developed, in no acute distress HEENT: normocephalic, atraumatic Neck: no JVD, carotid bruits or masses Cardiac:  RRR; no significant murmurs, no rubs, or gallops Lungs:  CTA b/l, no wheezing, rhonchi or rales Abd: soft, nontender MS: no deformity or atrophy Ext: no edema Skin: warm and dry, no rash Neuro:  No gross deficits appreciated Psych: euthymic mood, full affect   EKG:  Done today and reviewed by myself shows  SB 56bpm, no significant changes from prior  07/30/22: TTE  1. Left ventricular ejection fraction, by estimation, is 35 to 40%. The  left ventricle has moderately decreased function. The left ventricle  demonstrates regional wall motion abnormalities with basal inferior  akinesis, basal inferolateral akinesis, and  basal to mid inferolateral akinesis. There is mild concentric left  ventricular hypertrophy. Left ventricular diastolic parameters are  consistent with Grade I diastolic dysfunction (impaired relaxation).   2. Right ventricular systolic function is normal. The right ventricular  size is normal. Tricuspid regurgitation signal is inadequate for assessing  PA pressure.   3. Left atrial size was mildly dilated.   4. The mitral valve is normal in structure. No evidence of mitral valve  regurgitation. No evidence of mitral stenosis.   5. The aortic valve is tricuspid. There is  mild calcification of the  aortic valve. Aortic valve regurgitation is not visualized. No aortic  stenosis is present.   6. The inferior vena cava is normal in size with greater than 50%  respiratory variability, suggesting right atrial pressure of 3 mmHg.   02/19/22: c.MRI IMPRESSION: 1.  Severely dilated LV with diffuse hypokinesis, EF 24%. 2.  Normal RV size with mildly decreased systolic function, EF 55%. 3. Delayed enhancement images show coronary-pattern LGE in a circumflex coronary artery distribution, suspect prior MI involving the LCx.   I do not think that the amount of scarring from prior MI explains the extent of his cardiomyopathy. No evidence, however, for myocarditis or infiltrative disease by LGE  02/18/22: TEE 1. No LV thrombus. Left ventricular ejection fraction, by estimation, is  20%. The left ventricle has severely decreased function. The left  ventricle demonstrates global hypokinesis. The left ventricular internal  cavity size was moderately dilated.   2. Right ventricular systolic function is mildly reduced. The right  ventricular size is normal.   3. Left atrial size was moderately dilated. No left atrial/left atrial  appendage thrombus was detected.   4. Right atrial size was mildly dilated.   5. No PFO or ASD by color doppler.   6. The mitral valve is normal in structure. Mild mitral valve  regurgitation. No evidence of mitral stenosis.   7. The aortic valve is tricuspid. Aortic valve regurgitation is trivial.  No aortic stenosis is present.   8. Normal caliber descending thoracic aorta with minimal plaque.    01/16/22: R/LHC CONCLUSIONS: Luminal irregularities in the left main Luminal irregularities with up to 40% eccentric mid LAD Widely patent mid circumflex stent.  Diffuse disease in branches in the still second obtuse marginal. Dominant right coronary, mid eccentric 70% stenosis, distal 65% stenosis. Normal LVEDP Normal capillary wedge  pressure Mixed venous O2 saturation 72%   RECOMMENDATIONS: Systolic dysfunction is out of proportion to the degree of coronary disease. Guideline directed therapy for systolic heart failure. Resume apixaban in 8 hours.   01/14/22: TTE  1. The LV function is severely depressed. This is new compared to his  previous echo from 2020.      [name] Left ventricular ejection fraction, by estimation, is <20%. The left  ventricle has severely decreased function. The left ventricle demonstrates  global hypokinesis. The left ventricular internal cavity size was  moderately to severely dilated. Left  ventricular diastolic function could not be evaluated.   2. Right ventricular systolic function is mildly reduced. The right  ventricular size is mildly enlarged. There is normal pulmonary artery  systolic  pressure.   3. Left atrial size was severely dilated.   4. Right atrial size was mildly dilated.   5. The mitral valve is degenerative. Mild mitral valve regurgitation.   6. The aortic valve is calcified. Aortic valve regurgitation is not  visualized. No aortic stenosis is present.    Recent Labs: 02/18/2022: Hemoglobin 13.3; Platelets 93 02/19/2022: Magnesium 2.4 02/27/2022: ALT 24; B Natriuretic Peptide 1,146.6 07/30/2022: BUN 32; Creatinine, Ser 2.40; Potassium 4.6; Sodium 141; TSH 1.552  02/17/2022: Cholesterol 126; HDL 26; LDL Cholesterol 71; Total CHOL/HDL Ratio 4.8; Triglycerides 145; VLDL 29   CrCl cannot be calculated (Patient's most recent lab result is older than the maximum 21 days allowed.).   Wt Readings from Last 3 Encounters:  08/20/22 138 lb 9.6 oz (62.9 kg)  08/05/22 134 lb 9.6 oz (61.1 kg)  07/30/22 137 lb 12.8 oz (62.5 kg)     Other studies reviewed: Additional studies/records reviewed today include: summarized above  ASSESSMENT AND PLAN:  Persistent AFib CHA2DS2Vasc is 5, on Eliquis, appropriately dosed Amiodarone Planned for PVI ablation/TEE though unfortunately reliable  a/c compliance remains an issue.  We discussed importance of a/c and risks of not taking it, stroke.  Discussed acute increase in risk post ablation. They understand  We discussed looking towards alternative with warfarin, though he does not want to have to do the lab draws Discussed perhaps looking into alternative [place] if covered and patient assistance. They were provided patient assistance paperwork  In d/w Dr. Quentin Ore, we will be unable to pursue ablation at this time until reliable anticoagulation is established TEE/EPS-ablation have been cancelled   Amio labs today   NICM Unclear etiology, tachy-mediated vs post viral vs familial Chronic CHF No symptoms or exam findings of volume OL C/w HF team  Will order a dig level for the day he comes to see Dr. Johney Frame, advised not to take the digoxin that AM, they have had to do that before.  CAD No symptoms He sees Dr. Harl Bowie In a few weeks, and Dr. Johney Frame January, unclear why Will not cancel anything until he sees Dr. Harl Bowie  5. HTN Repeat BP 160/70 I asked that they check his BP at home, they have a cuff and used to but stopped because it had been ok Asked that they let us know if SBP regularly > 140  Disposition: F/u with cardiology as scheduled, EP in 4 mo to follow amio.  Current medicines are reviewed at length with the patient today.  The patient did not have any concerns regarding medicines.  Venetia Night, PA-C 10/18/2022 10:28 AM     [place] [address] [phone] (office)  [phone] (fax)

## 2022-10-21 ENCOUNTER — Encounter: Payer: Self-pay | Admitting: Physician Assistant

## 2022-10-21 ENCOUNTER — Ambulatory Visit: Payer: Medicare HMO | Attending: Physician Assistant | Admitting: Physician Assistant

## 2022-10-21 VITALS — BP 160/70 | HR 56 | Ht 65.0 in | Wt 135.0 lb

## 2022-10-21 DIAGNOSIS — I428 Other cardiomyopathies: Secondary | ICD-10-CM | POA: Diagnosis not present

## 2022-10-21 DIAGNOSIS — Z79899 Other long term (current) drug therapy: Secondary | ICD-10-CM

## 2022-10-21 DIAGNOSIS — I2510 Atherosclerotic heart disease of native coronary artery without angina pectoris: Secondary | ICD-10-CM | POA: Diagnosis not present

## 2022-10-21 DIAGNOSIS — I4819 Other persistent atrial fibrillation: Secondary | ICD-10-CM

## 2022-10-21 DIAGNOSIS — I10 Essential (primary) hypertension: Secondary | ICD-10-CM

## 2022-10-21 NOTE — Addendum Note (Signed)
Addended by: Claude Manges on: 10/21/2022 10:47 AM   Modules accepted: Orders

## 2022-10-21 NOTE — Patient Instructions (Signed)
Medication Instructions:    Your physician recommends that you continue on your current medications as directed. Please refer to the Current Medication list given to you today.  *If you need a refill on your cardiac medications before your next appointment, please call your pharmacy*   Lab Work:  CMET CBC AND TSH TODAY   If you have labs (blood work) drawn today and your tests are completely normal, you will receive your results only by: [facility] (if you have MyChart) OR A paper copy in the mail If you have any lab test that is abnormal or we need to change your treatment, we will call you to review the results.   Testing/Procedures:  YOUR PROCEDURES HAVE BEEN CANCELLED PER PROVIDER    Follow-Up: At [facility], you and your health needs are our priority.  As part of our continuing mission to provide you with exceptional heart care, we have created designated Provider Care Teams.  These Care Teams include your primary Cardiologist (physician) and Advanced Practice Providers (APPs -  Physician Assistants and Nurse Practitioners) who all work together to provide you with the care you need, when you need it.  We recommend signing up for the patient portal called "MyChart".  Sign up information is provided on this After Visit Summary.  MyChart is used to connect with patients for Virtual Visits (Telemedicine).  Patients are able to view lab/test results, encounter notes, upcoming appointments, etc.  Non-urgent messages can be sent to your provider as well.   To learn more about what you can do with MyChart, go to [email or web address].    Your next appointment:   4 month(s)  The format for your next appointment:   In Person  Provider:   Legrand Como "Jonni Sanger" Chalmers Cater, PA-C or Tommye Standard, PA-C    Other Instructions   Important Information About Sugar

## 2022-10-22 ENCOUNTER — Ambulatory Visit (HOSPITAL_COMMUNITY): Admission: RE | Admit: 2022-10-22 | Payer: Medicare HMO | Source: Ambulatory Visit | Admitting: Cardiovascular Disease

## 2022-10-22 ENCOUNTER — Encounter (HOSPITAL_COMMUNITY): Admission: RE | Payer: Self-pay | Source: Ambulatory Visit

## 2022-10-22 LAB — CBC
Hematocrit: 39.5 % (ref 37.5–51.0)
Hemoglobin: 13.3 g/dL (ref 13.0–17.7)
MCH: 30.4 pg (ref 26.6–33.0)
MCHC: 33.7 g/dL (ref 31.5–35.7)
MCV: 90 fL (ref 79–97)
Platelets: 190 10*3/uL (ref 150–450)
RBC: 4.37 x10E6/uL (ref 4.14–5.80)
RDW: 14.6 % (ref 11.6–15.4)
WBC: 11.2 10*3/uL — ABNORMAL HIGH (ref 3.4–10.8)

## 2022-10-22 LAB — COMPREHENSIVE METABOLIC PANEL
ALT: 48 IU/L — ABNORMAL HIGH (ref 0–44)
AST: 38 IU/L (ref 0–40)
Albumin/Globulin Ratio: 1.3 (ref 1.2–2.2)
Albumin: 4.4 g/dL (ref 3.8–4.8)
Alkaline Phosphatase: 68 IU/L (ref 44–121)
BUN/Creatinine Ratio: 15 (ref 10–24)
BUN: 28 mg/dL — ABNORMAL HIGH (ref 8–27)
Bilirubin Total: 0.5 mg/dL (ref 0.0–1.2)
CO2: 21 mmol/L (ref 20–29)
Calcium: 9.5 mg/dL (ref 8.6–10.2)
Chloride: 106 mmol/L (ref 96–106)
Creatinine, Ser: 1.88 mg/dL — ABNORMAL HIGH (ref 0.76–1.27)
Globulin, Total: 3.4 g/dL (ref 1.5–4.5)
Glucose: 83 mg/dL (ref 70–99)
Potassium: 4.4 mmol/L (ref 3.5–5.2)
Sodium: 142 mmol/L (ref 134–144)
Total Protein: 7.8 g/dL (ref 6.0–8.5)
eGFR: 37 mL/min/{1.73_m2} — ABNORMAL LOW (ref >59)

## 2022-10-22 LAB — TSH: TSH: 4.07 u[IU]/mL (ref 0.450–4.500)

## 2022-10-22 SURGERY — ECHOCARDIOGRAM, TRANSESOPHAGEAL
Anesthesia: Monitor Anesthesia Care

## 2022-10-24 NOTE — Telephone Encounter (Signed)
Advanced Heart Failure Patient Advocate Encounter  OOP submitted was not enough and most likely will not be able to be meet by the end of the year.. Patient's wife aware. Will submit to [employer] for them to add to his account.   Charlann Boxer, CPhT

## 2022-10-28 ENCOUNTER — Encounter (HOSPITAL_COMMUNITY): Admission: RE | Payer: Self-pay | Source: Ambulatory Visit

## 2022-10-28 ENCOUNTER — Ambulatory Visit (HOSPITAL_COMMUNITY): Admission: RE | Admit: 2022-10-28 | Payer: Medicare HMO | Source: Ambulatory Visit | Admitting: Cardiology

## 2022-10-28 SURGERY — ATRIAL FIBRILLATION ABLATION
Anesthesia: General

## 2022-11-01 DIAGNOSIS — R350 Frequency of micturition: Secondary | ICD-10-CM | POA: Diagnosis not present

## 2022-11-01 DIAGNOSIS — N390 Urinary tract infection, site not specified: Secondary | ICD-10-CM | POA: Diagnosis not present

## 2022-11-01 DIAGNOSIS — Z299 Encounter for prophylactic measures, unspecified: Secondary | ICD-10-CM | POA: Diagnosis not present

## 2022-11-01 DIAGNOSIS — I10 Essential (primary) hypertension: Secondary | ICD-10-CM | POA: Diagnosis not present

## 2022-11-01 DIAGNOSIS — Z6823 Body mass index (BMI) 23.0-23.9, adult: Secondary | ICD-10-CM | POA: Diagnosis not present

## 2022-11-01 DIAGNOSIS — R21 Rash and other nonspecific skin eruption: Secondary | ICD-10-CM | POA: Diagnosis not present

## 2022-11-01 DIAGNOSIS — F17210 Nicotine dependence, cigarettes, uncomplicated: Secondary | ICD-10-CM | POA: Diagnosis not present

## 2022-11-01 DIAGNOSIS — N481 Balanitis: Secondary | ICD-10-CM | POA: Diagnosis not present

## 2022-11-07 ENCOUNTER — Ambulatory Visit: Payer: Medicare HMO | Admitting: Cardiology

## 2022-11-08 DIAGNOSIS — Z299 Encounter for prophylactic measures, unspecified: Secondary | ICD-10-CM | POA: Diagnosis not present

## 2022-11-08 DIAGNOSIS — I10 Essential (primary) hypertension: Secondary | ICD-10-CM | POA: Diagnosis not present

## 2022-11-08 DIAGNOSIS — N481 Balanitis: Secondary | ICD-10-CM | POA: Diagnosis not present

## 2022-11-16 ENCOUNTER — Other Ambulatory Visit (HOSPITAL_COMMUNITY): Payer: Self-pay | Admitting: Cardiology

## 2022-11-16 ENCOUNTER — Other Ambulatory Visit (HOSPITAL_COMMUNITY): Payer: Self-pay | Admitting: Family Medicine

## 2022-12-03 ENCOUNTER — Ambulatory Visit: Payer: Medicare HMO | Attending: Physician Assistant | Admitting: Physician Assistant

## 2022-12-03 ENCOUNTER — Ambulatory Visit
Admission: RE | Admit: 2022-12-03 | Discharge: 2022-12-03 | Disposition: A | Discharge status: Home / Self Care | Payer: Medicare HMO | Source: Ambulatory Visit | Attending: Physician Assistant | Admitting: Physician Assistant

## 2022-12-03 ENCOUNTER — Encounter: Payer: Self-pay | Admitting: Physician Assistant

## 2022-12-03 ENCOUNTER — Ambulatory Visit: Payer: Medicare HMO | Admitting: Cardiology

## 2022-12-03 VITALS — BP 168/80 | HR 59 | Ht 65.0 in | Wt 138.0 lb

## 2022-12-03 DIAGNOSIS — N1832 Chronic kidney disease, stage 3b: Secondary | ICD-10-CM | POA: Diagnosis not present

## 2022-12-03 DIAGNOSIS — I10 Essential (primary) hypertension: Secondary | ICD-10-CM

## 2022-12-03 DIAGNOSIS — R079 Chest pain, unspecified: Secondary | ICD-10-CM | POA: Diagnosis not present

## 2022-12-03 DIAGNOSIS — I4819 Other persistent atrial fibrillation: Secondary | ICD-10-CM | POA: Diagnosis not present

## 2022-12-03 DIAGNOSIS — I5020 Unspecified systolic (congestive) heart failure: Secondary | ICD-10-CM

## 2022-12-03 DIAGNOSIS — I25119 Atherosclerotic heart disease of native coronary artery with unspecified angina pectoris: Secondary | ICD-10-CM

## 2022-12-03 MED ORDER — HYDRALAZINE HCL 50 MG PO TABS: 50.0000 mg | ORAL_TABLET | Freq: Three times a day (TID) | ORAL | 3 refills | Status: DC

## 2022-12-03 MED ORDER — ISOSORBIDE MONONITRATE ER 60 MG PO TB24: 60.0000 mg | ORAL_TABLET | Freq: Every day | ORAL | 3 refills | Status: DC

## 2022-12-03 MED ORDER — NITROGLYCERIN 0.4 MG SL SUBL: 0.4000 mg | SUBLINGUAL_TABLET | SUBLINGUAL | 3 refills | Status: AC | PRN

## 2022-12-03 NOTE — Assessment & Plan Note (Signed)
History of prior MI and PCI to the LCx in 2000 and repeat PCI to the LCx in 2014.  Cardiac catheterization in February 2023 demonstrated a patent stent in the LCx, moderate disease in the mid and distal RCA and mild nonobstructive disease in the mid LAD.  He has been managed medically.  He recently has noted chest discomfort with and without exertion.  He has chest tightness as well as sharp chest pain.  His symptoms are not like his previous angina.  Etiology is not entirely clear.  He did have moderate disease in the RCA last year at cardiac catheterization.  He also has a history of esophageal stricture/GERD.  He remains on PPI therapy.  I have recommended proceeding with stress testing.  He is agreeable.  We will try to get this arranged prior to his follow-up with Dr. Harl Bowie next week. -Lexiscan Myoview -CMET, CBC -Chest x-ray -Increase isosorbide mononitrate to 60 mg daily -If cardiac workup unremarkable, consider GI workup -Follow-up with Dr. Harl Bowie next week as planned

## 2022-12-03 NOTE — Progress Notes (Signed)
Cardiology Office Note:    Date:  12/03/2022   ID:  Vonzella Nipple, DOB 01-28-1946, MRN [id number]  PCP:  Neale Burly, MD  [place] [facility] Cardiologist:  Carlyle Dolly, MD Electrophysiologist:  Vickie Epley, MD  Advanced Heart Failure:  Loralie Champagne, MD     Referring MD: Neale Burly, MD   Patient Profile: Coronary artery disease S/p prior MI/PCI to LCx in 2000; repeat PCI to LCx in 2014 Cath 01/16/22: mLAD 40, mLCx stent patent, mRCA 70, dRCA 65 >> Med Rx Persistent atrial fibrillation  S/p TEE DCCV in 01/2022 Amiodarone Rx (HFrEF) heart failure with reduced ejection fraction  Likely tachy-induced CM superimposed on ischemic CM Admx 12/2021 w acute HF and AF w RVR, EF< 20; DC'd on LifeVest Admx 01/2022 - managed w Milrinone  CMR 02/19/22: EF 24, RVEF 45; LGE c/w LCx territory infarct (CM out of proportion to ischemic heart dz) TTE 07/30/22: EF 35-40, inf/inf-lat AK, mild LVH, Gr 1 DD, NL RVSF, mild LAE, RAP 3 No ICD; LifeVest DC'd 2/2 improved EF NSVT Chronic kidney disease stage III Entresto not started in 07/2022 due to ? SCr Hx of UTIs No SGLT2 inhib Grave's disease S/p RAI ablation Hypertension  Hyperlipidemia  Mild thrombocytopenia +Cigs  Hx of TIA    History of Present Illness:   Ronald Mcdowell is a 77 y.o. male with the above problem list.  He was last seen by Dr. Carlyle Dolly in 07/2021. He has been followed by Dr. Aundra Dubin since in the CHF clinic due to (HFrEF) heart failure with reduced ejection fraction dx in Feb 2023. He was last seen by Dr. Aundra Dubin in 07/2022. He has had improved EF in NSR and was referred to EP for possible PVI ablation. He has had issues with adherence to meds due to cost and had missed some doses of Eliquis. He saw Tommye Standard, PA-C 10/21/22. He could not have a CT due to his chronic kidney disease. He was to have a TEE prior to his procedure. Pt tells me today his ablation was canceled and they are going to try to  reschedule in March 2024.     He was scheduled to see Dr. Johney Frame in [place] today. Our office noted the mistake and canceled. The pt did not get the message and came to our office today for his appt. He also has an appt with Dr. Harl Bowie in [place] 1/15. He was initially going to just f/u in [place] next week. However, he noted chest pain to the schedulers and was added on for eval.   The patient notes chest pain off and on x 2-3 mos. He notes a sharp substernal pain that lasts 2-3 secs. This can occur with exertion or at rest. He can exert himself without symptoms. He also note chest tightness. This last 15-20 mins. He is having some chest pain today. This pain can also occur with exertion or without. He can also exert himself w/o symptoms of chest tightness. No relation to meals. He has some pain occurring when he lies in certain positions. No pleuritic chest pain. He has noted occ radiation to his neck and L arm. He has had nausea at times and diaphoresis. He has only used NTG x 1 several weeks ago with relief. Most of the time, the pain is too brief to attempt NTG. No orthopnea, leg edema, palps, syncope.   EKG:  sinus brady, HR 59, septal Qs, non-specific ST-TW changes vs U waves  in V4-5, QTc 449, TW changes improved when compared to last EKG.    Reviewed and updated this encounter:  Tobacco  Allergies  Meds  Problems  Med Hx  Surg Hx  Fam Hx     Review of Systems  Constitutional: Negative for fever.  Respiratory:  Negative for cough.   Gastrointestinal:  Positive for diarrhea. Negative for hematochezia.  Genitourinary:  Negative for hematuria.  Neurological:  Positive for paresthesias.    Labs/Other Test Reviewed:   Recent Labs: 02/19/2022: Magnesium 2.4 02/27/2022: B Natriuretic Peptide 1,146.6 10/21/2022: ALT 48; BUN 28; Creatinine, Ser 1.88; Hemoglobin 13.3; Platelets 190; Potassium 4.4; Sodium 142; TSH 4.070  Recent Lipid Panel Recent Labs    02/17/22 0343  CHOL 126   TRIG 145  HDL 26*  VLDL 29  LDLCALC 71    Risk Assessment/Calculations/Metrics:    CHA2DS2-VASc Score = 7   This indicates a 11.2% annual risk of stroke. The patient's score is based upon: CHF History: 1 HTN History: 1 Diabetes History: 0 Stroke History: 2 Vascular Disease History: 1 Age Score: 2 Gender Score: 0        HYPERTENSION CONTROL Vitals:   12/03/22 1020 12/03/22 1518  BP: (!) 180/78 (!) 168/80    The patient's blood pressure is elevated above target today.  In order to address the patient's elevated BP: A current anti-hypertensive medication was adjusted today.      Physical Exam:   VS:  BP (!) 168/80   Pulse (!) 59   Ht '5\' 5"'$  (1.651 m)   Wt 138 lb (62.6 kg)   SpO2 98%   BMI 22.96 kg/m    Wt Readings from Last 3 Encounters:  12/03/22 138 lb (62.6 kg)  10/21/22 135 lb (61.2 kg)  08/20/22 138 lb 9.6 oz (62.9 kg)    Constitutional:      Appearance: Healthy appearance. Not in distress.  Neck:     Vascular: JVD normal.  Pulmonary:     Effort: Pulmonary effort is normal.     Breath sounds: No wheezing. No rales.  Cardiovascular:     Normal rate. Regular rhythm. Normal S1. Normal S2.      Murmurs: There is a grade 2/6 systolic murmur at the LLSB.  Edema:    Peripheral edema absent.  Abdominal:     Palpations: Abdomen is soft. There is no hepatomegaly.          ASSESSMENT & PLAN:   Coronary artery disease involving native coronary artery of native heart with angina pectoris ([place]) History of prior MI and PCI to the LCx in 2000 and repeat PCI to the LCx in 2014.  Cardiac catheterization in February 2023 demonstrated a patent stent in the LCx, moderate disease in the mid and distal RCA and mild nonobstructive disease in the mid LAD.  He has been managed medically.  He recently has noted chest discomfort with and without exertion.  He has chest tightness as well as sharp chest pain.  His symptoms are not like his previous angina.  Etiology is not  entirely clear.  He did have moderate disease in the RCA last year at cardiac catheterization.  He also has a history of esophageal stricture/GERD.  He remains on PPI therapy.  I have recommended proceeding with stress testing.  He is agreeable.  We will try to get this arranged prior to his follow-up with Dr. Harl Bowie next week. -Lexiscan Myoview -CMET, CBC -Chest x-ray -Increase isosorbide mononitrate to 60 mg daily -If  cardiac workup unremarkable, consider GI workup -Follow-up with Dr. Harl Bowie next week as planned  Persistent atrial fibrillation ([place]) Maintaining sinus rhythm.  He remains on amiodarone.  Recent TSH was normal.  Obtain follow-up CMET today.  Obtain chest x-ray to evaluate chest pain symptoms.  He notes that he has been adherent to anticoagulation therapy with Eliquis 5 mg twice daily.  He has seen EP and the plan was to proceed with PVI ablation in December.  This was canceled.  Apparently, the plan is to attempt to proceed with PVI ablation again in March 2024.  HFrEF (heart failure with reduced ejection fraction) (HCC) It has been suspected that he had a tachycardia induced cardiomyopathy superimposed on an ischemic cardiomyopathy.  His EF has improved in sinus rhythm.  As noted, he has seen EP and PVI ablation is planned in the future.  Volume status on exam is stable.  He is NYHA II.  He was not placed on Entresto due to worsening renal function.  He has also not been placed on beta-blocker therapy due to bradycardia.  Continue digoxin 0.0 65 mg daily, hydralazine, isosorbide mononitrate, spironolactone 12.5 mg daily.  Follow-up with Dr. Aundra Dubin in the heart failure clinic in the next 6 to 8 weeks.  Essential (primary) hypertension Blood pressure is uncontrolled increase hydralazine to 50 mg 3 times a day.  Increase isosorbide mononitrate to 60 mg daily.  Continue spironolactone 12.5 mg daily.  Obtain follow-up CMET today.  Chronic kidney disease (CKD), stage III (moderate)  (HCC) Obtain follow-up CMET today.  We discussed the importance of avoiding cardiac catheterization if at all possible to avoid contrast-induced nephropathy.  Hopefully, his stress test will be low risk and we will not have to consider cardiac catheterization.         Shared Decision Making/Informed Consent The risks [chest pain, shortness of breath, cardiac arrhythmias, dizziness, blood pressure fluctuations, myocardial infarction, stroke/transient ischemic attack, nausea, vomiting, allergic reaction, radiation exposure, metallic taste sensation and life-threatening complications (estimated to be 1 in 10,000)], benefits (risk stratification, diagnosing coronary artery disease, treatment guidance) and alternatives of a nuclear stress test were discussed in detail with Mr. Wendell and he agrees to proceed.   Dispo:  Return in 6 days (on 12/09/2022) for Scheduled Follow Up w/ Dr. Harl Bowie.   Medication Adjustments/Labs and Tests Ordered: Current medicines are reviewed at length with the patient today.  Concerns regarding medicines are outlined above.  Tests Ordered: Orders Placed This Encounter  Procedures   DG Chest 2 View   Comp Met (CMET)   CBC   MYOCARDIAL PERFUSION IMAGING   EKG 12-Lead   Medication Changes: Meds ordered this encounter  Medications   isosorbide mononitrate (IMDUR) 60 MG 24 hr tablet    Sig: Take 1 tablet (60 mg total) by mouth daily.    Dispense:  90 tablet    Refill:  3   hydrALAZINE (APRESOLINE) 50 MG tablet    Sig: Take 1 tablet (50 mg total) by mouth 3 (three) times daily.    Dispense:  270 tablet    Refill:  3   nitroGLYCERIN (NITROSTAT) 0.4 MG SL tablet    Sig: Place 1 tablet (0.4 mg total) under the tongue every 5 (five) minutes as needed for chest pain.    Dispense:  25 tablet    Refill:  3   Signed, Richardson Dopp, PA-C  12/03/2022 3:39 PM    [address], [place], [place]  [address] Phone: [phone])  [phone]; Fax: [phone]

## 2022-12-03 NOTE — Assessment & Plan Note (Signed)
Blood pressure is uncontrolled increase hydralazine to 50 mg 3 times a day.  Increase isosorbide mononitrate to 60 mg daily.  Continue spironolactone 12.5 mg daily.  Obtain follow-up CMET today.

## 2022-12-03 NOTE — Assessment & Plan Note (Signed)
It has been suspected that he had a tachycardia induced cardiomyopathy superimposed on an ischemic cardiomyopathy.  His EF has improved in sinus rhythm.  As noted, he has seen EP and PVI ablation is planned in the future.  Volume status on exam is stable.  He is NYHA II.  He was not placed on Entresto due to worsening renal function.  He has also not been placed on beta-blocker therapy due to bradycardia.  Continue digoxin 0.0 65 mg daily, hydralazine, isosorbide mononitrate, spironolactone 12.5 mg daily.  Follow-up with Dr. Aundra Dubin in the heart failure clinic in the next 6 to 8 weeks.

## 2022-12-03 NOTE — Patient Instructions (Addendum)
Medication Instructions:  Your physician has recommended you make the following change in your medication:   INCREASE the Isosorbide to 60 mg taking 1 daily  INCREASE the Hydralazine to 50 mg taking 1 tablet 3 times a day  *If you need a refill on your cardiac medications before your next appointment, please call your pharmacy*   Lab Work: TODAY:  CMET & CBC  If you have labs (blood work) drawn today and your tests are completely normal, you will receive your results only by: [place] (if you have MyChart) OR A paper copy in the mail If you have any lab test that is abnormal or we need to change your treatment, we will call you to review the results.   Testing/Procedures: A chest x-ray takes a picture of the organs and structures inside the chest, including the heart, lungs, and blood vessels. This test can show several things, including, whether the heart is enlarges; whether fluid is building up in the lungs; and whether pacemaker / defibrillator leads are still in place. [place], [address]   Your physician has requested that you have a lexiscan myoview WHEREVER CAN DO IT BEFORE MONDAY 12/09/21.  For further information please visit [email or web address]. Please follow instruction sheet, BELOW:    You are scheduled for a Myocardial Perfusion Imaging Study  Please arrive 15 minutes prior to your appointment time for registration and insurance purposes.  The test will take approximately 3 to 4 hours to complete; you may bring reading material.  If someone comes with you to your appointment, they will need to remain in the main lobby due to limited space in the testing area. **If you are pregnant or breastfeeding, please notify the nuclear lab prior to your appointment**  How to prepare for your Myocardial Perfusion Test: Do not eat or drink 3 hours prior to your test, except you may have water. Do not consume products containing caffeine  (regular or decaffeinated) 12 hours prior to your test. (ex: coffee, chocolate, sodas, tea). Do bring a list of your current medications with you.  If not listed below, you may take your medications as normal. Do wear comfortable clothes (no dresses or overalls) and walking shoes, tennis shoes preferred (No heels or open toe shoes are allowed). Do NOT wear cologne, perfume, aftershave, or lotions (deodorant is allowed). If these instructions are not followed, your test will have to be rescheduled.    Follow-Up: At [facility], you and your health needs are our priority.  As part of our continuing mission to provide you with exceptional heart care, we have created designated Provider Care Teams.  These Care Teams include your primary Cardiologist (physician) and Advanced Practice Providers (APPs -  Physician Assistants and Nurse Practitioners) who all work together to provide you with the care you need, when you need it.  We recommend signing up for the patient portal called "MyChart".  Sign up information is provided on this After Visit Summary.  MyChart is used to connect with patients for Virtual Visits (Telemedicine).  Patients are able to view lab/test results, encounter notes, upcoming appointments, etc.  Non-urgent messages can be sent to your provider as well.   To learn more about what you can do with MyChart, go to [email or web address].    Your next appointment:   AS SCHEDULED WITH DR. BRANCH  6-8 WEEKS WITH DR. Aundra Dubin   The format for your next appointment:   In Person  Provider:         Other Instructions   Important Information About Sugar

## 2022-12-03 NOTE — Assessment & Plan Note (Signed)
Obtain follow-up CMET today.  We discussed the importance of avoiding cardiac catheterization if at all possible to avoid contrast-induced nephropathy.  Hopefully, his stress test will be low risk and we will not have to consider cardiac catheterization.

## 2022-12-03 NOTE — Assessment & Plan Note (Signed)
Maintaining sinus rhythm.  He remains on amiodarone.  Recent TSH was normal.  Obtain follow-up CMET today.  Obtain chest x-ray to evaluate chest pain symptoms.  He notes that he has been adherent to anticoagulation therapy with Eliquis 5 mg twice daily.  He has seen EP and the plan was to proceed with PVI ablation in December.  This was canceled.  Apparently, the plan is to attempt to proceed with PVI ablation again in March 2024.

## 2022-12-04 ENCOUNTER — Telehealth (HOSPITAL_COMMUNITY): Payer: Self-pay | Admitting: *Deleted

## 2022-12-04 LAB — CBC
Hematocrit: 39.1 % (ref 37.5–51.0)
Hemoglobin: 13.2 g/dL (ref 13.0–17.7)
MCH: 30.4 pg (ref 26.6–33.0)
MCHC: 33.8 g/dL (ref 31.5–35.7)
MCV: 90 fL (ref 79–97)
Platelets: 180 10*3/uL (ref 150–450)
RBC: 4.34 x10E6/uL (ref 4.14–5.80)
RDW: 13.6 % (ref 11.6–15.4)
WBC: 11.5 10*3/uL — ABNORMAL HIGH (ref 3.4–10.8)

## 2022-12-04 LAB — COMPREHENSIVE METABOLIC PANEL
ALT: 38 IU/L (ref 0–44)
AST: 39 IU/L (ref 0–40)
Albumin/Globulin Ratio: 1.2 (ref 1.2–2.2)
Albumin: 4.3 g/dL (ref 3.8–4.8)
Alkaline Phosphatase: 72 IU/L (ref 44–121)
BUN/Creatinine Ratio: 14 (ref 10–24)
BUN: 25 mg/dL (ref 8–27)
Bilirubin Total: 0.6 mg/dL (ref 0.0–1.2)
CO2: 19 mmol/L — ABNORMAL LOW (ref 20–29)
Calcium: 9 mg/dL (ref 8.6–10.2)
Chloride: 107 mmol/L — ABNORMAL HIGH (ref 96–106)
Creatinine, Ser: 1.78 mg/dL — ABNORMAL HIGH (ref 0.76–1.27)
Globulin, Total: 3.5 g/dL (ref 1.5–4.5)
Glucose: 78 mg/dL (ref 70–99)
Potassium: 4.4 mmol/L (ref 3.5–5.2)
Sodium: 142 mmol/L (ref 134–144)
Total Protein: 7.8 g/dL (ref 6.0–8.5)
eGFR: 39 mL/min/{1.73_m2} — ABNORMAL LOW (ref >59)

## 2022-12-04 NOTE — Progress Notes (Signed)
Pt has been made aware of normal result and verbalized understanding.  jw

## 2022-12-04 NOTE — Telephone Encounter (Signed)
Per DPR spoke to pt's wife and gave instructions for MPI study on 12/06/22.

## 2022-12-06 ENCOUNTER — Ambulatory Visit (HOSPITAL_COMMUNITY): Payer: Medicare HMO | Attending: Physician Assistant

## 2022-12-06 DIAGNOSIS — I25119 Atherosclerotic heart disease of native coronary artery with unspecified angina pectoris: Secondary | ICD-10-CM | POA: Insufficient documentation

## 2022-12-06 LAB — MYOCARDIAL PERFUSION IMAGING
LV dias vol: 216 mL (ref 62–150)
LV sys vol: 142 mL
Nuc Stress EF: 34 %
Peak HR: 80 {beats}/min
Rest HR: 50 {beats}/min
Rest Nuclear Isotope Dose: 9.6 mCi
SDS: 1
SRS: 10
SSS: 11
ST Depression (mm): 0 mm
Stress Nuclear Isotope Dose: 32.9 mCi
Stress perfusion cavity size (mL): 200 mL
TID: 1.05

## 2022-12-06 MED ORDER — REGADENOSON 0.4 MG/5ML IV SOLN
0.4000 mg | Freq: Once | INTRAVENOUS | Status: AC
  Administered 2022-12-06: 0.4 mg via INTRAVENOUS

## 2022-12-06 MED ORDER — TECHNETIUM TC 99M TETROFOSMIN IV KIT
9.6000 | PACK | Freq: Once | INTRAVENOUS | Status: AC | PRN
  Administered 2022-12-06: 9.6 via INTRAVENOUS

## 2022-12-06 MED ORDER — TECHNETIUM TC 99M TETROFOSMIN IV KIT
32.9000 | PACK | Freq: Once | INTRAVENOUS | Status: AC | PRN
  Administered 2022-12-06: 32.9 via INTRAVENOUS

## 2022-12-09 ENCOUNTER — Encounter: Payer: Self-pay | Admitting: Cardiology

## 2022-12-09 ENCOUNTER — Telehealth: Payer: Self-pay

## 2022-12-09 ENCOUNTER — Ambulatory Visit: Payer: Medicare HMO | Attending: Cardiology | Admitting: Cardiology

## 2022-12-09 VITALS — BP 175/80 | HR 63 | Ht 65.0 in | Wt 135.0 lb

## 2022-12-09 DIAGNOSIS — Z79899 Other long term (current) drug therapy: Secondary | ICD-10-CM

## 2022-12-09 DIAGNOSIS — I10 Essential (primary) hypertension: Secondary | ICD-10-CM | POA: Diagnosis not present

## 2022-12-09 DIAGNOSIS — I25119 Atherosclerotic heart disease of native coronary artery with unspecified angina pectoris: Secondary | ICD-10-CM

## 2022-12-09 MED ORDER — HYDRALAZINE HCL 50 MG PO TABS: 75.0000 mg | ORAL_TABLET | Freq: Three times a day (TID) | ORAL | 3 refills | Status: DC

## 2022-12-09 NOTE — Progress Notes (Signed)
Clinical Summary Ronald Mcdowell is a 77 y.o.male seen today for follow up of the following medical problems.    1. CAD - Sept 2014 stenting to LCX, medically managed RCA disease but could consider intervention if neccesary - 12/2021 cath: LIs LM, LAD 40%, patent LCX stent, RCA 70%.  -Jan 2024 nuclear stress: prior infarct, no current ischemia -imdur was increased to '60mg'$  daily.   - occasional mild chest pains, overall not bother some. Better with tums.  If reoccur consider GI    2. Chronic HFrEF - 12/2021 echo: LVEF <20% - 12/2021 cath:  12/2021 cath: LIs LM, LAD 40%, patent LCX stent, RCA 70%.  - 01/2022 cMRI: LVEF 24%, prior scar not in proportion to degree of LV function.  - 07/2022 echo: LVEF 35-40%  - no beta blocker due to bradycardia.  - no recent edema, no SOB/DOE -compliant with meds. GFR 39 -history of UTIs, not on SGLT2i   2. Afib - on amio, followed by EP with   -plans were for ablation but patient not consistent with anticoag   Dig level requsted by EP   3. HTN - compliant with meds  - home bp's 170s-180 - compliant with meds - at Jan 9 PA weaver increased hydral to '50mg'$  tid, imdur to '60mg'$      4. Hyperlipidemia     01/2021 TC 136 HDL 34 TG 66 LDL 87. After these labs we increased lipitor to '80mg'$  daily.     5. CKD   SH: works at [employer] in [place], works Glass blower/designer.  Past Medical History:  Diagnosis Date   Anemia    Atrial fibrillation (HCC)    CHF (congestive heart failure) (HCC)    Chronic kidney disease, unspecified 01/14/2020   Coronary artery disease    a. h/o MI in 2000 w/ prior LCX stenting;  b. 7.2014 Abnl Cardiolite, EF 41%, mod-large inferolat scar;  c. 07/2013 Cath/PCI: LM nl, LAD 10-20, D1 40-50p, LCX 95-99 @ distal stent margin (2.5x20 Promus Premier DES), RCA dom, 40p, 53m 70d, EF 35-40%.   Elevated cholesterol    GERD (gastroesophageal reflux disease)    Graves disease 04/2020   s/p I-131 thyroid ablation    Hemorrhoids     Hypertension    Hyperthyroidism 09/27/2020   Ischemic cardiomyopathy    a. 07/2013 EF 35-40% by LV gram.; EF 50% on ECHO in November 2020   Myocardial infarction ([facility]    Reflux esophagitis    Spondylosis of cervical joint    C3-4, C6-7   Tobacco user    Vitamin D deficiency disease 08/24/2019     Allergies  Allergen Reactions   Peanut Butter Flavor Swelling     Current Outpatient Medications  Medication Sig Dispense Refill   amiodarone (PACERONE) 200 MG tablet Take 1 tablet (200 mg total) by mouth daily. 90 tablet 2   apixaban (ELIQUIS) 5 MG TABS tablet TAKE 1 TABLET(5 MG) BY MOUTH TWICE DAILY 180 tablet 1   aspirin EC 81 MG tablet Take 81 mg by mouth daily. Swallow whole.     atorvastatin (LIPITOR) 80 MG tablet Take 1 tablet (80 mg total) by mouth daily. 90 tablet 3   Cholecalciferol (VITAMIN D-3) 125 MCG (5000 UT) TABS Take 1 tablet by mouth once a week.     digoxin (LANOXIN) 0.125 MG tablet TAKE 1/2 TABLET EVERY DAY 45 tablet 3   hydrALAZINE (APRESOLINE) 50 MG tablet Take 1 tablet (50 mg total) by mouth 3 (three)  times daily. 270 tablet 3   isosorbide mononitrate (IMDUR) 60 MG 24 hr tablet Take 1 tablet (60 mg total) by mouth daily. 90 tablet 3   mineral oil-hydrophilic petrolatum (AQUAPHOR) ointment Apply 1 Application topically daily as needed for dry skin or irritation.     nitroGLYCERIN (NITROSTAT) 0.4 MG SL tablet Place 1 tablet (0.4 mg total) under the tongue every 5 (five) minutes as needed for chest pain. 25 tablet 3   pantoprazole (PROTONIX) 40 MG tablet Take 1 tablet (40 mg total) by mouth daily. 90 tablet 3   potassium chloride (KLOR-CON) 10 MEQ tablet TAKE 1 TABLET EVERY DAY 90 tablet 3   spironolactone (ALDACTONE) 25 MG tablet Take 0.5 tablets (12.5 mg total) by mouth daily. 45 tablet 2   No current facility-administered medications for this visit.     Past Surgical History:  Procedure Laterality Date   BIOPSY  01/09/2018   Procedure: BIOPSY;  Surgeon: Daneil Dolin, MD;  Location: [facility];  Service: Endoscopy;;  esophageal biopsies   CARDIAC CATHETERIZATION     CARDIOVERSION N/A 02/18/2022   Procedure: CARDIOVERSION;  Surgeon: Larey Dresser, MD;  Location: [facility] ENDOSCOPY;  Service: Cardiovascular;  Laterality: N/A;   COLONOSCOPY     COLONOSCOPY N/A 01/25/2016   RMR: normal ileocolonoscopy ( anal canal hemorrhoids)   CORONARY ANGIOPLASTY WITH STENT PLACEMENT  08/23/2013   mid circumflex  DES    by Dr Martinique   CORONARY STENT PLACEMENT  2000   ESOPHAGOGASTRODUODENOSCOPY N/A 01/25/2016   RMR: Reflux esophagitits. Cervical web with passage of the scope. Status post esophageal biosy. Hiatal hernia.    ESOPHAGOGASTRODUODENOSCOPY N/A 01/09/2018   Rourk: cervical/proximal esophageal web s/p dilation, markedly abnormal esophagus c/w biopsy proven eosinophilic esophagitis. small hiatal hernia.   ESOPHAGOGASTRODUODENOSCOPY N/A 05/31/2020   Procedure: ESOPHAGOGASTRODUODENOSCOPY (EGD);  Surgeon: Daneil Dolin, MD;  Location: [facility];  Service: Endoscopy;  Laterality: N/A;  8:30am   LEFT HEART CATHETERIZATION WITH CORONARY ANGIOGRAM N/A 08/23/2013   Procedure: LEFT HEART CATHETERIZATION WITH CORONARY ANGIOGRAM;  Surgeon: Peter M Martinique, MD;  Location: [facility] CATH LAB;  Service: Cardiovascular;  Laterality: N/A;   MALONEY DILATION N/A 01/09/2018   Procedure: [name] DILATION;  Surgeon: Daneil Dolin, MD;  Location: [facility];  Service: Endoscopy;  Laterality: N/A;   MALONEY DILATION N/A 05/31/2020   Procedure: [name] DILATION;  Surgeon: Daneil Dolin, MD;  Location: [facility];  Service: Endoscopy;  Laterality: N/A;   RIGHT/LEFT HEART CATH AND CORONARY ANGIOGRAPHY N/A 01/16/2022   Procedure: RIGHT/LEFT HEART CATH AND CORONARY ANGIOGRAPHY;  Surgeon: Belva Crome, MD;  Location: [place] CV LAB;  Service: Cardiovascular;  Laterality: N/A;   TEE WITHOUT CARDIOVERSION N/A 02/18/2022   Procedure: TRANSESOPHAGEAL ECHOCARDIOGRAM (TEE);  Surgeon:  Larey Dresser, MD;  Location: [facility] ENDOSCOPY;  Service: Cardiovascular;  Laterality: N/A;   TRANSURETHRAL RESECTION OF PROSTATE  01/09/2012   Procedure: TRANSURETHRAL RESECTION OF THE PROSTATE (TURP);  Surgeon: Marissa Nestle, MD;  Location: [facility] ORS;  Service: Urology;  Laterality: N/A;     Allergies  Allergen Reactions   Peanut Butter Flavor Swelling      Family History  Problem Relation Age of Onset   Early death Mother        childbirth   Heart attack Father    Cancer Father    Heart disease Brother        CABG   Heart disease Brother  slight heart attack   Anesthesia problems Neg Hx    Hypotension Neg Hx    Malignant hyperthermia Neg Hx    Pseudochol deficiency Neg Hx    Colon cancer Neg Hx    Liver disease Neg Hx    Gastric cancer Neg Hx    Esophageal cancer Neg Hx      Social History Mr. Gangemi reports that he has been smoking cigarettes. He started smoking about 57 years ago. He has a 53.00 pack-year smoking history. He has never used smokeless tobacco. Mr. Friedt reports no history of alcohol use.   Review of Systems CONSTITUTIONAL: No weight loss, fever, chills, weakness or fatigue.  HEENT: Eyes: No visual loss, blurred vision, double vision or yellow sclerae.No hearing loss, sneezing, congestion, runny nose or sore throat.  SKIN: No rash or itching.  CARDIOVASCULAR: per hpi RESPIRATORY: No shortness of breath, cough or sputum.  GASTROINTESTINAL: No anorexia, nausea, vomiting or diarrhea. No abdominal pain or blood.  GENITOURINARY: No burning on urination, no polyuria NEUROLOGICAL: No headache, dizziness, syncope, paralysis, ataxia, numbness or tingling in the extremities. No change in bowel or bladder control.  MUSCULOSKELETAL: No muscle, back pain, joint pain or stiffness.  LYMPHATICS: No enlarged nodes. No history of splenectomy.  PSYCHIATRIC: No history of depression or anxiety.  ENDOCRINOLOGIC: No reports of sweating, cold or heat intolerance. No  polyuria or polydipsia.  [name]   Physical Examination Today's Vitals   12/09/22 1051  BP: (!) 180/86  Pulse: 63  SpO2: 98%  Weight: 135 lb (61.2 kg)  Height: '5\' 5"'$  (1.651 m)   Body mass index is 22.47 kg/m.  Gen: resting comfortably, no acute distress HEENT: no scleral icterus, pupils equal round and reactive, no palptable cervical adenopathy,  CV: RRR, no m/r/g, no jvd Resp: Clear to auscultation bilaterally GI: abdomen is soft, non-tender, non-distended, normal bowel sounds, no hepatosplenomegaly MSK: extremities are warm, no edema.  Skin: warm, no rash Neuro:  no focal deficits Psych: appropriate affect   Diagnostic Studies 12/2021 RHC/LHC CONCLUSIONS: Luminal irregularities in the left main Luminal irregularities with up to 40% eccentric mid LAD Widely patent mid circumflex stent.  Diffuse disease in branches in the still second obtuse marginal. Dominant right coronary, mid eccentric 70% stenosis, distal 65% stenosis. Normal LVEDP Normal capillary wedge pressure Mixed venous O2 saturation 72%   RECOMMENDATIONS: Systolic dysfunction is out of proportion to the degree of coronary disease. Guideline directed therapy for systolic heart failure. Resume apixaban in 8 hours.  12/2021 echo 1. The LV function is severely depressed. This is new compared to his  previous echo from 2020.      [name] Left ventricular ejection fraction, by estimation, is <20%. The left  ventricle has severely decreased function. The left ventricle demonstrates  global hypokinesis. The left ventricular internal cavity size was  moderately to severely dilated. Left  ventricular diastolic function could not be evaluated.   2. Right ventricular systolic function is mildly reduced. The right  ventricular size is mildly enlarged. There is normal pulmonary artery  systolic pressure.   3. Left atrial size was severely dilated.   4. Right atrial size was mildly dilated.   5. The mitral valve is  degenerative. Mild mitral valve regurgitation.   6. The aortic valve is calcified. Aortic valve regurgitation is not  visualized. No aortic stenosis is present.   01/2022 cMRI IMPRESSION: 1.  Severely dilated LV with diffuse hypokinesis, EF 24%.   2.  Normal RV size with mildly  decreased systolic function, EF 35%.   3. Delayed enhancement images show coronary-pattern LGE in a circumflex coronary artery distribution, suspect prior MI involving the LCx.   I do not think that the amount of scarring from prior MI explains the extent of his cardiomyopathy. No evidence, however, for myocarditis or infiltrative disease by LGE.   07/2022 echo 1. Left ventricular ejection fraction, by estimation, is 35 to 40%. The  left ventricle has moderately decreased function. The left ventricle  demonstrates regional wall motion abnormalities with basal inferior  akinesis, basal inferolateral akinesis, and  basal to mid inferolateral akinesis. There is mild concentric left  ventricular hypertrophy. Left ventricular diastolic parameters are  consistent with Grade I diastolic dysfunction (impaired relaxation).   2. Right ventricular systolic function is normal. The right ventricular  size is normal. Tricuspid regurgitation signal is inadequate for assessing  PA pressure.   3. Left atrial size was mildly dilated.   4. The mitral valve is normal in structure. No evidence of mitral valve  regurgitation. No evidence of mitral stenosis.   5. The aortic valve is tricuspid. There is mild calcification of the  aortic valve. Aortic valve regurgitation is not visualized. No aortic  stenosis is present.   6. The inferior vena cava is normal in size with greater than 50%  respiratory variability, suggesting right atrial pressure of 3 mmHg.    Jan 2024 nuclear stress Findings are consistent with infarction. The study is intermediate risk.   No ST deviation was noted.   LV perfusion is abnormal. There is no  evidence of ischemia. There is evidence of infarction. Defect 1: There is a medium defect with moderate reduction in uptake present in the mid to basal inferolateral location(s) that is fixed. There is abnormal wall motion in the defect area. Consistent with infarction.   Left ventricular function is abnormal. Global function is moderately reduced. End diastolic cavity size is moderately enlarged.   Prior study available for comparison from 06/10/2013. There are changes compared to prior study. The left ventricular ejection fraction has decreased.   Intermediate risk nuclear perfusion study.   There is an old inferolateral infarction and left ventricular systolic function is moderately depressed.  There is no evidence of reversible ischemia.   Perfusion pattern is similar to the previous study, but the left ventricular systolic function is lower.   Assessment and Plan   1. CAD - recent benign stress test, symptom since then have improved. Tend to resolve with prn tums, if recurrent would consider GI eval   2. HTN -above goal, increase hydral to '75mg'$  bid     3. Afib -no symptoms, continue current meds -dig level requried by EP, we will order   4. Dizziness - chronic dizziness, reports not specifically positional - orthostatics today are negative. Asked to discuss with pcp  5. Chronic combined systolic/diastolic HF - medications limited by renal dysfunction, bradycardia, UTIs avoiding SGLT2i - with HTN will increase hydral to '75mg'$  tid   Arnoldo Lenis, M.D.

## 2022-12-09 NOTE — Patient Instructions (Signed)
Medication Instructions:   Increase Hydralazine to 75 mg Three Times Daily   *If you need a refill on your cardiac medications before your next appointment, please call your pharmacy*   Lab Work: Your physician recommends that you return for lab work in: Today   If you have labs (blood work) drawn today and your tests are completely normal, you will receive your results only by: [place] (if you have MyChart) OR A paper copy in the mail If you have any lab test that is abnormal or we need to change your treatment, we will call you to review the results.   Testing/Procedures: NONE    Follow-Up: At [facility], you and your health needs are our priority.  As part of our continuing mission to provide you with exceptional heart care, we have created designated Provider Care Teams.  These Care Teams include your primary Cardiologist (physician) and Advanced Practice Providers (APPs -  Physician Assistants and Nurse Practitioners) who all work together to provide you with the care you need, when you need it.  We recommend signing up for the patient portal called "MyChart".  Sign up information is provided on this After Visit Summary.  MyChart is used to connect with patients for Virtual Visits (Telemedicine).  Patients are able to view lab/test results, encounter notes, upcoming appointments, etc.  Non-urgent messages can be sent to your provider as well.   To learn more about what you can do with MyChart, go to [email or web address].    Your next appointment:   6 month(s)  Provider:   Carlyle Dolly, MD    Other Instructions Thank you for choosing [facility]!

## 2022-12-11 NOTE — Telephone Encounter (Signed)
Left message for return call.

## 2023-01-02 ENCOUNTER — Encounter (HOSPITAL_COMMUNITY): Payer: Self-pay | Admitting: Cardiology

## 2023-01-02 ENCOUNTER — Ambulatory Visit (HOSPITAL_COMMUNITY)
Admission: RE | Admit: 2023-01-02 | Discharge: 2023-01-02 | Disposition: A | Discharge status: Home / Self Care | Payer: Medicare HMO | Source: Ambulatory Visit | Attending: Cardiology | Admitting: Cardiology

## 2023-01-02 VITALS — BP 160/80 | HR 57 | Wt 135.2 lb

## 2023-01-02 DIAGNOSIS — I5042 Chronic combined systolic (congestive) and diastolic (congestive) heart failure: Secondary | ICD-10-CM

## 2023-01-02 DIAGNOSIS — N183 Chronic kidney disease, stage 3 unspecified: Secondary | ICD-10-CM | POA: Insufficient documentation

## 2023-01-02 DIAGNOSIS — I252 Old myocardial infarction: Secondary | ICD-10-CM | POA: Diagnosis not present

## 2023-01-02 DIAGNOSIS — I472 Ventricular tachycardia, unspecified: Secondary | ICD-10-CM | POA: Diagnosis not present

## 2023-01-02 DIAGNOSIS — Z7901 Long term (current) use of anticoagulants: Secondary | ICD-10-CM | POA: Diagnosis not present

## 2023-01-02 DIAGNOSIS — I2510 Atherosclerotic heart disease of native coronary artery without angina pectoris: Secondary | ICD-10-CM | POA: Diagnosis not present

## 2023-01-02 DIAGNOSIS — I4819 Other persistent atrial fibrillation: Secondary | ICD-10-CM | POA: Diagnosis not present

## 2023-01-02 DIAGNOSIS — I5022 Chronic systolic (congestive) heart failure: Secondary | ICD-10-CM | POA: Insufficient documentation

## 2023-01-02 DIAGNOSIS — I130 Hypertensive heart and chronic kidney disease with heart failure and stage 1 through stage 4 chronic kidney disease, or unspecified chronic kidney disease: Secondary | ICD-10-CM | POA: Insufficient documentation

## 2023-01-02 DIAGNOSIS — I25119 Atherosclerotic heart disease of native coronary artery with unspecified angina pectoris: Secondary | ICD-10-CM

## 2023-01-02 DIAGNOSIS — Z79899 Other long term (current) drug therapy: Secondary | ICD-10-CM | POA: Insufficient documentation

## 2023-01-02 LAB — LIPID PANEL
Cholesterol: 124 mg/dL (ref 0–200)
HDL: 39 mg/dL — ABNORMAL LOW (ref >40)
LDL Cholesterol: 76 mg/dL (ref 0–99)
Total CHOL/HDL Ratio: 3.2 RATIO
Triglycerides: 43 mg/dL (ref <150)
VLDL: 9 mg/dL (ref 0–40)

## 2023-01-02 LAB — COMPREHENSIVE METABOLIC PANEL
ALT: 42 U/L (ref 0–44)
AST: 30 U/L (ref 15–41)
Albumin: 3.9 g/dL (ref 3.5–5.0)
Alkaline Phosphatase: 51 U/L (ref 38–126)
Anion gap: 6 (ref 5–15)
BUN: 26 mg/dL — ABNORMAL HIGH (ref 8–23)
CO2: 27 mmol/L (ref 22–32)
Calcium: 9 mg/dL (ref 8.9–10.3)
Chloride: 106 mmol/L (ref 98–111)
Creatinine, Ser: 1.74 mg/dL — ABNORMAL HIGH (ref 0.61–1.24)
GFR, Estimated: 40 mL/min — ABNORMAL LOW (ref >60)
Glucose, Bld: 82 mg/dL (ref 70–99)
Potassium: 4.4 mmol/L (ref 3.5–5.1)
Sodium: 139 mmol/L (ref 135–145)
Total Bilirubin: 0.8 mg/dL (ref 0.3–1.2)
Total Protein: 7.8 g/dL (ref 6.5–8.1)

## 2023-01-02 LAB — TSH: TSH: 3.046 u[IU]/mL (ref 0.350–4.500)

## 2023-01-02 LAB — DIGOXIN LEVEL: Digoxin Level: 1.2 ng/mL (ref 0.8–2.0)

## 2023-01-02 MED ORDER — ENTRESTO 24-26 MG PO TABS: 1.0000 | ORAL_TABLET | Freq: Two times a day (BID) | ORAL | 11 refills | Status: DC

## 2023-01-02 NOTE — Progress Notes (Signed)
PCP: Neale Burly, MD HF Cardiology: Dr. Aundra Dubin  77 y.o. with history of CAD, CKD stage 3, persistent atrial fibrillation, and probably primarily nonischemic cardiomyopathy.  He had PCI to LCx in 2000 and again in 9/14. Echo in 2020 showed EF 50% with normal RV.     He was noted to be in atrial fibrillation starting in 9/22.    Admitted 2/23 w/ acute HF. He was in atrial fibrillation, but V-rates noted to be decently controlled and was not cardioverted. Echo showed severely reduced LVEF,  LV severely dilated, EF <20% w/ global HK, RV mildy enlarged w/ mildly reduced systolic function. No LVH. Only mild MR and trivial TR. Had subsequent R/LHC which showed only mod nonobstructive CAD w/ widely patent, previously placed LCx stents. RCA dominant w/  70% stenosis in mid portion and 65% dRCA stenosis. Systolic dysfunction out of proportion to degree of CAD. RHC showed low filling pressures, mRAP 1, mPAP 15, mPCWP 11 and preserved cardiac output, Fick CO 4.38 L/min, CI 2.59 L/min. He was placed on GDMT but no loop diuretic. LifeVest was also placed for low EF and frequent NSVT.   Unclear etiology of low EF.  Posible viral cardiomyopathy as he had suspected PNA 3-4 weeks prior to HF admission in 2/23. Also ?familial given FH. 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 No SGLTi due to recurrent UTI.    Patient was admitted in 3/23 with lightheadedness and weakness.  The day prior to admission, he had a worse episode of dizziness with palpitations and extreme weakness while doing weed-eating.  Few seconds later he had a noise (beeping) from LifeVest.  He sat down but symptoms persisted and had 2 more episodes of noise from LifeVest. Denied chest pain, shortness of breath or syncope.  He went to [facility] where he was in atrial fibrillation with rapid ventricular rate of 130s.  Placed on IV amiodarone, transferred to [facility].  Also of note, creatinine was up to 3.32.  PICC was placed, patient was  noted to have low co-ox and started on milrinone.  He was diuresed.  He ultimately had TEE-guided DCCV back to NSR.  TEE showed EF 20%, moderate LV dilation, mildly decreased RV systolic function. Cardiac MRI was done, showing severely dilated LV with diffuse hypokinesis EF 24%, normal RV size with mildly decreased systolic function EF 74%; delayed enhancement images show coronary-pattern LGE in a circumflex coronary artery distribution, suspect prior MI involving the LCx. Creatinine trended down, milrinone was stopped.    Echo in 9/23 showed EF 35-40%, basal inferior/basal anterolateral/basal-mid inferolateral akinesis, normal RV.   Cardiolite was done in 1/24 due to chest pain, EF 34% with old inferolateral MI, no ischemia.   Patient returns for followup of CHF.  Atrial fibrillation ablation was rescheduled as he had missed several doses of Eliquis prior.  No palpitations, he is in NSR today.  BP is elevated today.  No further chest pain. No significant exertional dyspnea, he can climb up stairs and do other activities of daily living without dyspnea.  No orthopnea/PND.    ECG (personally reviewed): NSR, LVH with repolarization abnormality, PVC.   Labs (3/23): K 4.2, creatinine 2.83 => 1.9 Labs (5/23): K 3.8, creatinine 1.96, free T3 decreased, free T4 high, TSH low, digoxin 1.3 Labs (11/23): LFTs normal Labs (1/24): K 4.4, creatinine 1.78  Past Medical History: 1. CAD: PCI to LCx 2000, PCI LCx 9/14.   - LHC (2/23): 70% mRCA, 40% mLAD.  No intervention.  -  Cardiolite (1/24): EF 34% with old inferolateral MI, no ischemia. 2. Cardiomyopathy: Suspect predominantly nonischemic.  Echo in 2020 with EF 50%.  Echo in 2/23 with EF < 20%, moderate-severe LV dilation, mild RVE with mildly decreased RV systolic function.   - RHC (2/23): mean RA 1, PA 24/10, mean PCWP 11, CI 2.59 - TEE (3/23) showed EF 20%, moderate LV dilation, mildly decreased RV systolic function.  - Cardiac MRI (3/23) showed severely  dilated LV with diffuse hypokinesis EF 24%, normal RV size with mildly decreased systolic function EF 84%; delayed enhancement images show coronary-pattern LGE in a circumflex coronary artery distribution, suspect prior MI involving the LCx.  - Echo (9/23): EF 35-40%, basal inferior/basal anterolateral/basal-mid inferolateral akinesis, normal RV.  3. CKD stage 3 4. H/o UTIs 5. Atrial fibrillation: Persistent, noted in 9/22.  - TEE-guided DCCV in 3/23.  6. H/o Graves disease with hyperthyroidism s/p iodine thyroid ablation.  7. HTN.  8. Prior smoker 9. NSVT 10. Thrombocytopenia: Mild, chronic.   Social History   Socioeconomic History   Marital status: Married    Spouse name: [name]   Number of children: Not on file   Years of education: 11th grade   Highest education level: 11th grade  Occupational History   Occupation: Librarian, academic  Tobacco Use   Smoking status: Every Day    Packs/day: 0.50    Years: 53.00    Total pack years: 26.50    Types: Cigarettes    Start date: 12/09/1965   Smokeless tobacco: Never   Tobacco comments:    3/4 pack of cigarettes daily; smoked since 77 years old  Vaping Use   Vaping Use: Never used  Substance and Sexual Activity   Alcohol use: No    Alcohol/week: 0.0 standard drinks of alcohol    Comment: No bowel since year 2000.  Some previous heavy use.   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   Social Determinants of Health   Financial Resource Strain: Low Risk  (01/23/2022)   Overall Financial Resource Strain (CARDIA)    Difficulty of Paying Living Expenses: Not very hard  Food Insecurity: No Food Insecurity (01/23/2022)   Hunger Vital Sign    Worried About Running Out of Food in the Last Year: Never true    Ran Out of Food in the Last Year: Never true   Transportation Needs: No Transportation Needs (01/23/2022)   PRAPARE - Hydrologist (Medical): No    Lack of Transportation (Non-Medical): No  Physical Activity: Sufficiently Active (08/23/2019)   Exercise Vital Sign    Days of Exercise per Week: 5 days    Minutes of Exercise per Session: 30 min  Stress: No Stress Concern Present (08/23/2019)   [place]    Feeling of Stress : Not at all  Social Connections: Not on file  Intimate Partner Violence: Not on file   Family History  Problem Relation Age of Onset   Early death Mother        childbirth   Heart attack Father    Cancer Father    Heart disease Brother        CABG   Heart disease Brother        slight heart attack  Anesthesia problems Neg Hx    Hypotension Neg Hx    Malignant hyperthermia Neg Hx    Pseudochol deficiency Neg Hx    Colon cancer Neg Hx    Liver disease Neg Hx    Gastric cancer Neg Hx    Esophageal cancer Neg Hx    ROS: All systems reviewed and negative except as per HPI.   Current Outpatient Medications  Medication Sig Dispense Refill   amiodarone (PACERONE) 200 MG tablet Take 1 tablet (200 mg total) by mouth daily. 90 tablet 2   apixaban (ELIQUIS) 5 MG TABS tablet TAKE 1 TABLET(5 MG) BY MOUTH TWICE DAILY 180 tablet 1   atorvastatin (LIPITOR) 80 MG tablet Take 1 tablet (80 mg total) by mouth daily. 90 tablet 3   Cholecalciferol (VITAMIN D-3) 125 MCG (5000 UT) TABS Take 1 tablet by mouth once a week.     digoxin (LANOXIN) 0.125 MG tablet TAKE 1/2 TABLET EVERY DAY 45 tablet 3   hydrALAZINE (APRESOLINE) 50 MG tablet Take 50 mg by mouth 3 (three) times daily.     isosorbide mononitrate (IMDUR) 60 MG 24 hr tablet Take 1 tablet (60 mg total) by mouth daily. 90 tablet 3   mineral oil-hydrophilic petrolatum (AQUAPHOR) ointment Apply 1 Application topically daily as needed for dry skin or irritation.     nitroGLYCERIN  (NITROSTAT) 0.4 MG SL tablet Place 1 tablet (0.4 mg total) under the tongue every 5 (five) minutes as needed for chest pain. 25 tablet 3   pantoprazole (PROTONIX) 40 MG tablet Take 1 tablet (40 mg total) by mouth daily. 90 tablet 3   potassium chloride (KLOR-CON) 10 MEQ tablet TAKE 1 TABLET EVERY DAY 90 tablet 3   sacubitril-valsartan (ENTRESTO) 24-26 MG Take 1 tablet by mouth 2 (two) times daily. 60 tablet 11   spironolactone (ALDACTONE) 25 MG tablet Take 0.5 tablets (12.5 mg total) by mouth daily. 45 tablet 2   No current facility-administered medications for this encounter.   BP (!) 160/80   Pulse (!) 57   Wt 61.3 kg (135 lb 3.2 oz)   SpO2 97%   BMI 22.50 kg/m  General: NAD Neck: No JVD, no thyromegaly or thyroid nodule.  Lungs: Clear to auscultation bilaterally with normal respiratory effort. CV: Nondisplaced PMI.  Heart regular S1/S2, no S3/S4, no murmur.  No peripheral edema.  No carotid bruit.  Normal pedal pulses.  Abdomen: Soft, nontender, no hepatosplenomegaly, no distention.  Skin: Intact without lesions or rashes.  Neurologic: Alert and oriented x 3.  Psych: Normal affect. Extremities: No clubbing or cyanosis.  HEENT: Normal.   Assessment/Plan: 1. Chronic systolic CHF: Suspect primarily nonischemic cardiomyopathy.  Echo in 2020 with EF 50%.  Echo in 2/23 with EF < 20%, moderate-severe LV dilation, mild RVE with mildly decreased RV systolic function.  Certainly possible this could be a tachy-mediated CMP given atrial fibrillation with RVR noted at admission in 3/23.  TEE showed EF 20%, moderate LV dilation, mildly decreased RV systolic function. Cardiac MRI was done, showing severely dilated LV with diffuse hypokinesis EF 24%, normal RV size with mildly decreased systolic function EF 96%; delayed enhancement images show coronary-pattern LGE in a circumflex coronary artery distribution, suspect prior MI involving the LCx, though do not think this can explain the extent of the  cardiomyopathy.  Echo in 9/23 showed improved EF 35-40%, basal inferior/basal anterolateral/basal-mid inferolateral akinesis, normal RV.  I suspect that he had a tachycardia-mediated cardiomyopathy superimposed on ischemic cardiomyopathy, EF improved in NSR  but still with wall motion abnormalities consistent with CAD.  Doing well currently, NYHA class I.  He is not volume overloaded on exam.   - He does not appear to need a diuretic.  - Continue digoxin 0.0625 daily.  Check digoxin level.   - Start Entresto 24/26 mg twice a day.  BMET today and again in 10 days.  - Continue spironolactone 12.5 daily.   - Hold beta blocker with bradycardia in setting of amiodarone use (HR 50s today).   - Continue hydralazine 50 mg tid and Imdur 60 mg daily.  - No SGLT2 inhibitor for now with h/o UTIs.  - With EF up to 35-40%, will defer ICD for now.  2. Atrial fibrillation: TEE/DCCV to NSR in 3/23.  Possible component of tachycardia-mediated cardiomyopathy.  He is in NSR today.  - Continue amiodarone 200 mg daily. Check LFTs, thyroid studies followed by endocrinology (h/o hyperthyroidism/Graves disease)  Will need regular eye exam.    - Continue Eliquis.  - Atrial fibrillation ablation has been rescheduled, patient thinks for March.  Hopefully, can stop amiodarone after ablation.  3. NSVT:  On amiodarone.  4. CKD stage 3: BMET today.  5. CAD: PCI to LCx remotely.  LHC in 2/23 with nonobstructive CAD.  Wall motion abnormalities on 9/23 are consistent with LCx territory MI, as was the cardiac MRI.  Cardiolite in 1/24 showed prior inferolateral MI, no ischemia. No chest pain.  - No ASA given Eliquis use.  - Continue atorvastatin, check lipids today.  6.  H/o Graves disease: Followed by endocrinology.  Will need to watch thyroid closely with amiodarone use.   Followup in 3 months with APP.   Loralie Champagne 01/02/2023

## 2023-01-02 NOTE — Patient Instructions (Signed)
START Entresto 24/26 mg one tab twice a day  Labs today We will only contact you if something comes back abnormal or we need to make some changes. Otherwise no news is good news!  Labs needed in 10-14 days   Your physician recommends that you schedule a follow-up appointment in: 3 months  in the Advanced Practitioners (PA/NP) Clinic    Do the following things EVERYDAY: Weigh yourself in the morning before breakfast. Write it down and keep it in a log. Take your medicines as prescribed Eat low salt foods--Limit salt (sodium) to 2000 mg per day.  Stay as active as you can everyday Limit all fluids for the day to less than 2 liters  At the [facility], you and your health needs are our priority. As part of our continuing mission to provide you with exceptional heart care, we have created designated Provider Care Teams. These Care Teams include your primary Cardiologist (physician) and Advanced Practice Providers (APPs- Physician Assistants and Nurse Practitioners) who all work together to provide you with the care you need, when you need it.   You may see any of the following providers on your designated Care Team at your next follow up: Dr Glori Bickers Dr Loralie Champagne Dr. Roxana Hires, NP Lyda Jester, [place] [facility] [place], [place] Forestine Na, NP Audry Riles, PharmD   Please be sure to bring in all your medications bottles to every appointment.    Thank you for choosing [facility]   If you have any questions or concerns before your next appointment please send [place] a message through [place] or call our office at [phone].    TO LEAVE A MESSAGE FOR THE NURSE SELECT OPTION 2, PLEASE LEAVE A MESSAGE INCLUDING: YOUR NAME DATE OF BIRTH CALL BACK NUMBER REASON FOR CALL**this is important as we prioritize the call backs  YOU WILL RECEIVE A CALL BACK THE SAME DAY AS LONG AS YOU CALL  BEFORE 4:00 PM

## 2023-01-03 ENCOUNTER — Telehealth (HOSPITAL_COMMUNITY): Payer: Self-pay

## 2023-01-03 ENCOUNTER — Other Ambulatory Visit (HOSPITAL_COMMUNITY): Payer: Self-pay

## 2023-01-03 MED ORDER — DIGOXIN 125 MCG PO TABS: 62.5000 ug | ORAL_TABLET | ORAL | 3 refills | Status: DC

## 2023-01-03 NOTE — Telephone Encounter (Addendum)
Pt aware, agreeable, and verbalized understanding  Med list updated   ----- Message from Larey Dresser, MD sent at 01/02/2023  8:41 PM EST ----- Digoxin level is a bit high, decrease digoxin to 0.0625 mg every other day.

## 2023-01-16 ENCOUNTER — Other Ambulatory Visit (HOSPITAL_COMMUNITY): Payer: Self-pay | Admitting: Cardiology

## 2023-01-17 LAB — SPECIMEN STATUS REPORT

## 2023-01-21 LAB — BASIC METABOLIC PANEL
BUN/Creatinine Ratio: 12 (ref 10–24)
BUN: 24 mg/dL (ref 8–27)
CO2: 20 mmol/L (ref 20–29)
Calcium: 9.1 mg/dL (ref 8.6–10.2)
Chloride: 103 mmol/L (ref 96–106)
Creatinine, Ser: 1.93 mg/dL — ABNORMAL HIGH (ref 0.76–1.27)
Glucose: 90 mg/dL (ref 70–99)
Potassium: 4.3 mmol/L (ref 3.5–5.2)
Sodium: 140 mmol/L (ref 134–144)
eGFR: 36 mL/min/{1.73_m2} — ABNORMAL LOW (ref >59)

## 2023-01-28 DIAGNOSIS — N390 Urinary tract infection, site not specified: Secondary | ICD-10-CM | POA: Diagnosis not present

## 2023-01-28 DIAGNOSIS — Z299 Encounter for prophylactic measures, unspecified: Secondary | ICD-10-CM | POA: Diagnosis not present

## 2023-01-28 DIAGNOSIS — I10 Essential (primary) hypertension: Secondary | ICD-10-CM | POA: Diagnosis not present

## 2023-01-28 DIAGNOSIS — I4891 Unspecified atrial fibrillation: Secondary | ICD-10-CM | POA: Diagnosis not present

## 2023-01-28 DIAGNOSIS — R300 Dysuria: Secondary | ICD-10-CM | POA: Diagnosis not present

## 2023-02-14 NOTE — Progress Notes (Addendum)
Cardiology Office Note Date:  02/14/2023  Patient ID:  Ronald Mcdowell, Ronald Mcdowell 05-11-1946, MRN [id number] PCP:  Neale Burly, MD  Cardiology: Dr. Harl Bowie HF:  Dr. Aundra Dubin Electrophysiologist: Dr. Quentin Ore    Chief Complaint:   19mo  History of Present Illness: Ronald Mcdowell is a 77 y.o. male with history of CAD (PCI 2000, 2014) CKD (III), recurrent UTIs, HTN, Afib, Graves dz (s/p iodine thyroid ablation), NSVT, NICM (primarily NICM, etiology unclear), chronic CHF (systolic).  Poor medication compliance 2/2 financial restrictions  He saw Dr. Quentin Ore 08/05/22, for consideration of AFib ablation with a potential of tachy-mediated CM, as well as symptomatic Afib.  Planned to proceed.  Unable to get cardiac CT 2/2 his renal function. Planned for TEE pre-procedure, scheduled for 10/22/22 w/Dr. Johnsie Cancel Ablation scheduled for 10/28/22  I saw him Nov 2023 He comes accompanied by his wife. He is doing well, feels well. No CP, palpitations SOB No near syncope or syncope. Eliquis compliance remains problematic 2/2 cost Unfortunately without being able to get reliable a/c on-line unable to pursue ablation, was canceled We discussed looking towards alternative with warfarin, though he did not want to have to do the lab draws Discussed perhaps looking into alternative [place] if covered and patient assistance, they were provided patient assistance paperwork  He saw S. Kathlen Mody, PA-C (perhaps in a scheduling error) 12/03/22, reported some CP, nitrate was increased, planned for stress testing and f/u with Dr. Harl Bowie and mentions perhaps plans for ablation march 2024  Stress test with area of infarct, no ischemia, EF 34%  He saw Dr. Harl Bowie 12/09/22, CP felt probably GI, no changes were made.  He saw Dr. Aundra Dubin 01/02/23, doing OK, able to climb stairs, no changes, again mentions that the patient reported rescheduled for ablation March 2024.  TODAY He is accompanied by his wife. He continues to have  occasional CP, central/left chest, mostly night, lasts a few seconds, maybe a minute, no radiation or associated symptoms, sometimes patting his chest helps, he has not used NTG for it, says "that stuff doesn't work", this is not a  new or changing symptom, predates his last stress test  No SOB No near syncope or syncope. In the last few months when walking longer distances,  he legs feel numb knees down/lateral calves, feet, says he has to look at his feet to know where he is stepping. Doesn't happen with short walks No back pain or LE pain  Occasionally aware of fast heart beat He is compliant with Eliquis, copay has gone down and is now affordable for them   AFib/AAD hx Dx Sept 2022 Amiodarone started march 2023 NOTE: thyroid issues pre-date amiodarone   Past Medical History:  Diagnosis Date   Anemia    Atrial fibrillation (HCC)    CHF (congestive heart failure) (HCC)    Chronic kidney disease, unspecified 01/14/2020   Coronary artery disease    a. h/o MI in 2000 w/ prior LCX stenting;  b. 7.2014 Abnl Cardiolite, EF 41%, mod-large inferolat scar;  c. 07/2013 Cath/PCI: LM nl, LAD 10-20, D1 40-50p, LCX 95-99 @ distal stent margin (2.5x20 Promus Premier DES), RCA dom, 40p, 36m, 70d, EF 35-40%.   Elevated cholesterol    GERD (gastroesophageal reflux disease)    Graves disease 04/2020   s/p I-131 thyroid ablation    Hemorrhoids    Hypertension    Hyperthyroidism 09/27/2020   Ischemic cardiomyopathy    a. 07/2013 EF 35-40% by LV gram.; EF  50% on ECHO in November 2020   Myocardial infarction [facility])    Reflux esophagitis    Spondylosis of cervical joint    C3-4, C6-7   Tobacco user    Vitamin D deficiency disease 08/24/2019    Past Surgical History:  Procedure Laterality Date   BIOPSY  01/09/2018   Procedure: BIOPSY;  Surgeon: Daneil Dolin, MD;  [facility];  Service: Endoscopy;;  esophageal biopsies   CARDIAC CATHETERIZATION     CARDIOVERSION N/A 02/18/2022    Procedure: CARDIOVERSION;  Surgeon: Larey Dresser, MD;  Location: [facility] ENDOSCOPY;  Service: Cardiovascular;  Laterality: N/A;   COLONOSCOPY     COLONOSCOPY N/A 01/25/2016   RMR: normal ileocolonoscopy ( anal canal hemorrhoids)   CORONARY ANGIOPLASTY WITH STENT PLACEMENT  08/23/2013   mid circumflex  DES    by Dr Martinique   CORONARY STENT PLACEMENT  2000   ESOPHAGOGASTRODUODENOSCOPY N/A 01/25/2016   RMR: Reflux esophagitits. Cervical web with passage of the scope. Status post esophageal biosy. Hiatal hernia.    ESOPHAGOGASTRODUODENOSCOPY N/A 01/09/2018   Rourk: cervical/proximal esophageal web s/p dilation, markedly abnormal esophagus c/w biopsy proven eosinophilic esophagitis. small hiatal hernia.   ESOPHAGOGASTRODUODENOSCOPY N/A 05/31/2020   Procedure: ESOPHAGOGASTRODUODENOSCOPY (EGD);  Surgeon: Daneil Dolin, MD;  Location: [facility];  Service: Endoscopy;  Laterality: N/A;  8:30am   LEFT HEART CATHETERIZATION WITH CORONARY ANGIOGRAM N/A 08/23/2013   Procedure: LEFT HEART CATHETERIZATION WITH CORONARY ANGIOGRAM;  Surgeon: Peter M Martinique, MD;  Location: [facility] CATH LAB;  Service: Cardiovascular;  Laterality: N/A;   MALONEY DILATION N/A 01/09/2018   Procedure: [name] DILATION;  Surgeon: Daneil Dolin, MD;  [facility];  Service: Endoscopy;  Laterality: N/A;   MALONEY DILATION N/A 05/31/2020   Procedure: [name] DILATION;  Surgeon: Daneil Dolin, MD;  [facility];  Service: Endoscopy;  Laterality: N/A;   RIGHT/LEFT HEART CATH AND CORONARY ANGIOGRAPHY N/A 01/16/2022   Procedure: RIGHT/LEFT HEART CATH AND CORONARY ANGIOGRAPHY;  Surgeon: Belva Crome, MD;  Location: [facility] CV LAB;  Service: Cardiovascular;  Laterality: N/A;   TEE WITHOUT CARDIOVERSION N/A 02/18/2022   Procedure: TRANSESOPHAGEAL ECHOCARDIOGRAM (TEE);  Surgeon: Larey Dresser, MD;  Location: [facility] ENDOSCOPY;  Service: Cardiovascular;  Laterality: N/A;   TRANSURETHRAL RESECTION OF PROSTATE  01/09/2012    Procedure: TRANSURETHRAL RESECTION OF THE PROSTATE (TURP);  Surgeon: Marissa Nestle, MD;  Location: [facility] ORS;  Service: Urology;  Laterality: N/A;    Current Outpatient Medications  Medication Sig Dispense Refill   amiodarone (PACERONE) 200 MG tablet Take 1 tablet (200 mg total) by mouth daily. 90 tablet 2   apixaban (ELIQUIS) 5 MG TABS tablet TAKE 1 TABLET(5 MG) BY MOUTH TWICE DAILY 180 tablet 1   atorvastatin (LIPITOR) 80 MG tablet Take 1 tablet (80 mg total) by mouth daily. 90 tablet 3   Cholecalciferol (VITAMIN D-3) 125 MCG (5000 UT) TABS Take 1 tablet by mouth once a week.     digoxin (LANOXIN) 0.125 MG tablet Take 0.5 tablets (62.5 mcg total) by mouth every other day. 45 tablet 3   hydrALAZINE (APRESOLINE) 50 MG tablet Take 50 mg by mouth 3 (three) times daily.     isosorbide mononitrate (IMDUR) 60 MG 24 hr tablet Take 1 tablet (60 mg total) by mouth daily. 90 tablet 3   mineral oil-hydrophilic petrolatum (AQUAPHOR) ointment Apply 1 Application topically daily as needed for dry skin or irritation.     nitroGLYCERIN (NITROSTAT) 0.4 MG  SL tablet Place 1 tablet (0.4 mg total) under the tongue every 5 (five) minutes as needed for chest pain. 25 tablet 3   pantoprazole (PROTONIX) 40 MG tablet Take 1 tablet (40 mg total) by mouth daily. 90 tablet 3   potassium chloride (KLOR-CON) 10 MEQ tablet TAKE 1 TABLET EVERY DAY 90 tablet 3   sacubitril-valsartan (ENTRESTO) 24-26 MG Take 1 tablet by mouth 2 (two) times daily. 60 tablet 11   spironolactone (ALDACTONE) 25 MG tablet Take 0.5 tablets (12.5 mg total) by mouth daily. 45 tablet 2   No current facility-administered medications for this visit.    Allergies:   Peanut butter flavor   Social History:  The patient  reports that he has been smoking cigarettes. He started smoking about 57 years ago. He has a 26.50 pack-year smoking history. He has never used smokeless tobacco. He reports that he does not drink alcohol and does not use drugs.    Family History:  The patient's family history includes Cancer in his father; Early death in his mother; Heart attack in his father; Heart disease in his brother and brother.  ROS:  Please see the history of present illness.    All other systems are reviewed and otherwise negative.   PHYSICAL EXAM:  VS:  There were no vitals taken for this visit. BMI: There is no height or weight on file to calculate BMI. Well nourished, well developed, in no acute distress HEENT: normocephalic, atraumatic Neck: no JVD, carotid bruits or masses Cardiac: RRR; no significant murmurs, no rubs, or gallops Lungs: CTA b/l, no wheezing, rhonchi or rales Abd: soft, nontender MS: no deformity or atrophy Ext: no edema, has scaly skin changes b/l, appear chronic, 3+ PT/DP pulses b/l, brisk cap refil, no cyanosis Skin: warm and dry, no rash Neuro:  No gross deficits appreciated Psych: euthymic mood, full affect   EKG:  not done today 01/02/23: personally reviewed SR 65bpm, PVC  12/06/22: stress myoview  Findings are consistent with infarction. The study is intermediate risk.   No ST deviation was noted.   LV perfusion is abnormal. There is no evidence of ischemia. There is evidence of infarction. Defect 1: There is a medium defect with moderate reduction in uptake present in the mid to basal inferolateral location(s) that is fixed. There is abnormal wall motion in the defect area. Consistent with infarction.   Left ventricular function is abnormal. Global function is moderately reduced. End diastolic cavity size is moderately enlarged.   Prior study available for comparison from 06/10/2013. There are changes compared to prior study. The left ventricular ejection fraction has decreased.   Intermediate risk nuclear perfusion study.   There is an old inferolateral infarction and left ventricular systolic function is moderately depressed.  There is no evidence of reversible ischemia.   Perfusion pattern is similar to  the previous study, but the left ventricular systolic function is lower.  07/30/22: TTE  1. Left ventricular ejection fraction, by estimation, is 35 to 40%. The  left ventricle has moderately decreased function. The left ventricle  demonstrates regional wall motion abnormalities with basal inferior  akinesis, basal inferolateral akinesis, and  basal to mid inferolateral akinesis. There is mild concentric left  ventricular hypertrophy. Left ventricular diastolic parameters are  consistent with Grade I diastolic dysfunction (impaired relaxation).   2. Right ventricular systolic function is normal. The right ventricular  size is normal. Tricuspid regurgitation signal is inadequate for assessing  PA pressure.   3. Left atrial size  was mildly dilated.   4. The mitral valve is normal in structure. No evidence of mitral valve  regurgitation. No evidence of mitral stenosis.   5. The aortic valve is tricuspid. There is mild calcification of the  aortic valve. Aortic valve regurgitation is not visualized. No aortic  stenosis is present.   6. The inferior vena cava is normal in size with greater than 50%  respiratory variability, suggesting right atrial pressure of 3 mmHg.   02/19/22: c.MRI IMPRESSION: 1.  Severely dilated LV with diffuse hypokinesis, EF 24%. 2.  Normal RV size with mildly decreased systolic function, EF [id number]. 3. Delayed enhancement images show coronary-pattern LGE in a circumflex coronary artery distribution, suspect prior MI involving the LCx.   I do not think that the amount of scarring from prior MI explains the extent of his cardiomyopathy. No evidence, however, for myocarditis or infiltrative disease by LGE  02/18/22: TEE 1. No LV thrombus. Left ventricular ejection fraction, by estimation, is  20%. The left ventricle has severely decreased function. The left  ventricle demonstrates global hypokinesis. The left ventricular internal  cavity size was moderately dilated.    2. Right ventricular systolic function is mildly reduced. The right  ventricular size is normal.   3. Left atrial size was moderately dilated. No left atrial/left atrial  appendage thrombus was detected.   4. Right atrial size was mildly dilated.   5. No PFO or ASD by color doppler.   6. The mitral valve is normal in structure. Mild mitral valve  regurgitation. No evidence of mitral stenosis.   7. The aortic valve is tricuspid. Aortic valve regurgitation is trivial.  No aortic stenosis is present.   8. Normal caliber descending thoracic aorta with minimal plaque.    01/16/22: R/LHC CONCLUSIONS: Luminal irregularities in the left main Luminal irregularities with up to 40% eccentric mid LAD Widely patent mid circumflex stent.  Diffuse disease in branches in the still second obtuse marginal. Dominant right coronary, mid eccentric 70% stenosis, distal 65% stenosis. Normal LVEDP Normal capillary wedge pressure Mixed venous O2 saturation 72%   RECOMMENDATIONS: Systolic dysfunction is out of proportion to the degree of coronary disease. Guideline directed therapy for systolic heart failure. Resume apixaban in 8 hours.   01/14/22: TTE  1. The LV function is severely depressed. This is new compared to his  previous echo from 2020.      [name] Left ventricular ejection fraction, by estimation, is <20%. The left  ventricle has severely decreased function. The left ventricle demonstrates  global hypokinesis. The left ventricular internal cavity size was  moderately to severely dilated. Left  ventricular diastolic function could not be evaluated.   2. Right ventricular systolic function is mildly reduced. The right  ventricular size is mildly enlarged. There is normal pulmonary artery  systolic pressure.   3. Left atrial size was severely dilated.   4. Right atrial size was mildly dilated.   5. The mitral valve is degenerative. Mild mitral valve regurgitation.   6. The aortic valve is  calcified. Aortic valve regurgitation is not  visualized. No aortic stenosis is present.    Recent Labs: 02/19/2022: Magnesium 2.4 02/27/2022: B Natriuretic Peptide 1,146.6 12/03/2022: Hemoglobin 13.2; Platelets 180 01/02/2023: ALT 42; TSH 3.046 01/16/2023: BUN 24; Creatinine, Ser 1.93; Potassium 4.3; Sodium 140  01/02/2023: Cholesterol 124; HDL 39; LDL Cholesterol 76; Total CHOL/HDL Ratio 3.2; Triglycerides 43; VLDL 9   CrCl cannot be calculated (Patient's most recent lab result is older than the  maximum 21 days allowed.).   Wt Readings from Last 3 Encounters:  01/02/23 135 lb 3.2 oz (61.3 kg)  12/09/22 135 lb (61.2 kg)  12/06/22 138 lb (62.6 kg)     Other studies reviewed: Additional studies/records reviewed today include: summarized above  ASSESSMENT AND PLAN:  Persistent AFib CHA2DS2Vasc is 5, on Eliquis,  appropriately dosed Amiodarone, labs are UTD SR today by exam, some palpitations He is vague on duration/frequency  Now that he has reliable access to a/c, discussed revisiting perhaps ablation, he was fairly non-committal, though would like to simplify his medications, and get off amiodarone if able I will have him see Dr. Quentin Ore in a couple months, revisit ablation candidacy, recommendations  NICM Unclear etiology, tachy-mediated vs post viral vs familial Chronic CHF No symptoms or exam findings of volume OL No BB w/hx of bradycardia, HR today 58 C/w HF team   CAD No BB w/hx of bradycardia Somewhat atypical and chronic sounding CP Recent ischemic w/u without ischemia   5. HTN Poorly controlled, repeat 160/80 Started on Entrest last month, Creat up some with this to 1.93 I will reach out to Dr. Aundra Dubin for some guidance  ADDEND: 02/21/23 Dr. Aundra Dubin recommended Delene Loll 49/51mg  BID To advise to stay well hydrated and get a BMET in 10 days Tommye Standard, PA-C  6. LE numbness Is b/l Only with longer ambulation but troublesome I do not suspect vascular disease base  on his exam though will sheck ABI/US Advised he talk with his PMD as well    Disposition: as above  Current medicines are reviewed at length with the patient today.  The patient did not have any concerns regarding medicines.  Venetia Night, PA-C 02/14/2023 11:04 AM     [facility] [place] [address] [phone] (office)  [phone] (fax)

## 2023-02-18 ENCOUNTER — Ambulatory Visit: Payer: Medicare HMO | Admitting: "Endocrinology

## 2023-02-19 ENCOUNTER — Encounter: Payer: Self-pay | Admitting: Physician Assistant

## 2023-02-19 ENCOUNTER — Ambulatory Visit: Payer: Medicare HMO | Attending: Physician Assistant | Admitting: Physician Assistant

## 2023-02-19 VITALS — BP 164/72 | HR 58 | Ht 65.0 in | Wt 135.6 lb

## 2023-02-19 DIAGNOSIS — I5022 Chronic systolic (congestive) heart failure: Secondary | ICD-10-CM

## 2023-02-19 DIAGNOSIS — I739 Peripheral vascular disease, unspecified: Secondary | ICD-10-CM | POA: Diagnosis not present

## 2023-02-19 DIAGNOSIS — I10 Essential (primary) hypertension: Secondary | ICD-10-CM | POA: Diagnosis not present

## 2023-02-19 DIAGNOSIS — I428 Other cardiomyopathies: Secondary | ICD-10-CM

## 2023-02-19 DIAGNOSIS — I2510 Atherosclerotic heart disease of native coronary artery without angina pectoris: Secondary | ICD-10-CM

## 2023-02-19 DIAGNOSIS — I4819 Other persistent atrial fibrillation: Secondary | ICD-10-CM | POA: Diagnosis not present

## 2023-02-19 MED ORDER — AMIODARONE HCL 200 MG PO TABS: 200.0000 mg | ORAL_TABLET | Freq: Every day | ORAL | 3 refills | Status: DC

## 2023-02-19 MED ORDER — ATORVASTATIN CALCIUM 80 MG PO TABS: 80.0000 mg | ORAL_TABLET | Freq: Every day | ORAL | 3 refills | Status: DC

## 2023-02-19 NOTE — Patient Instructions (Signed)
Medication Instructions:   Your physician recommends that you continue on your current medications as directed. Please refer to the Current Medication list given to you today.    *If you need a refill on your cardiac medications before your next appointment, please call your pharmacy*   Lab Work: [place]    If you have labs (blood work) drawn today and your tests are completely normal, you will receive your results only by: [place] (if you have MyChart) OR A paper copy in the mail If you have any lab test that is abnormal or we need to change your treatment, we will call you to review the results.   Testing/Procedures: Your physician has requested that you have an ankle brachial index (ABI). During this test an ultrasound and blood pressure cuff are used to evaluate the arteries that supply the arms and legs with blood. Allow thirty minutes for this exam. There are no restrictions or special instructions.   Follow-Up: At [facility], you and your health needs are our priority.  As part of our continuing mission to provide you with exceptional heart care, we have created designated Provider Care Teams.  These Care Teams include your primary Cardiologist (physician) and Advanced Practice Providers (APPs -  Physician Assistants and Nurse Practitioners) who all work together to provide you with the care you need, when you need it.  We recommend signing up for the patient portal called "MyChart".  Sign up information is provided on this After Visit Summary.  MyChart is used to connect with patients for Virtual Visits (Telemedicine).  Patients are able to view lab/test results, encounter notes, upcoming appointments, etc.  Non-urgent messages can be sent to your provider as well.   To learn more about what you can do with MyChart, go to [email or web address].    Your next appointment:   2 month(s)  Provider:   Lars Mage, MD    Other Instructions

## 2023-02-27 ENCOUNTER — Other Ambulatory Visit (HOSPITAL_COMMUNITY): Payer: Self-pay | Admitting: Cardiology

## 2023-02-27 ENCOUNTER — Telehealth: Payer: Self-pay | Admitting: *Deleted

## 2023-02-27 DIAGNOSIS — Z79899 Other long term (current) drug therapy: Secondary | ICD-10-CM

## 2023-02-27 MED ORDER — ENTRESTO 49-51 MG PO TABS: 1.0000 | ORAL_TABLET | Freq: Two times a day (BID) | ORAL | 6 refills | Status: DC

## 2023-02-27 MED ORDER — POTASSIUM CHLORIDE ER 10 MEQ PO TBCR: 10.0000 meq | EXTENDED_RELEASE_TABLET | Freq: Every day | ORAL | 3 refills | Status: DC

## 2023-02-27 NOTE — Telephone Encounter (Signed)
Lvm to call office back to discuss changes to medications

## 2023-02-27 NOTE — Telephone Encounter (Signed)
-----   Message from [place], [place] sent at 02/21/2023  7:55 AM EDT ----- Please see the addendum to my note. Dr. Aundra Dubin would like [place] to increase his Entresto to 49/51mg  BID dose with a BMET in 10 days  Please advise the patient to stay well hydrated, this is very important. Renee

## 2023-03-14 ENCOUNTER — Other Ambulatory Visit: Payer: Self-pay

## 2023-03-14 MED ORDER — HYDRALAZINE HCL 50 MG PO TABS: 50.0000 mg | ORAL_TABLET | Freq: Three times a day (TID) | ORAL | 2 refills | Status: DC

## 2023-03-14 NOTE — Telephone Encounter (Signed)
Pt's medication was sent to pt's pharmacy as requested. Confirmation received.  °

## 2023-03-20 ENCOUNTER — Ambulatory Visit: Payer: Medicare HMO | Attending: Physician Assistant

## 2023-03-20 DIAGNOSIS — I739 Peripheral vascular disease, unspecified: Secondary | ICD-10-CM

## 2023-03-21 LAB — VAS US ABI WITH/WO TBI
Left ABI: 1.07
Right ABI: 1.08

## 2023-04-01 NOTE — Progress Notes (Signed)
PCP: Toma Deiters, MD HF Cardiology: Dr. Shirlee Latch  77 y.o. with history of CAD, CKD stage 3, persistent atrial fibrillation, and probably primarily nonischemic cardiomyopathy.  He had PCI to LCx in 2000 and again in 9/14. Echo in 2020 showed EF 50% with normal RV.     He was noted to be in atrial fibrillation starting in 9/22.    Admitted 2/23 w/ acute HF. He was in atrial fibrillation, but V-rates noted to be decently controlled and was not cardioverted. Echo showed severely reduced LVEF,  LV severely dilated, EF <20% w/ global HK, RV mildy enlarged w/ mildly reduced systolic function. No LVH. Only mild MR and trivial TR. Had subsequent R/LHC which showed only mod nonobstructive CAD w/ widely patent, previously placed LCx stents. RCA dominant w/  70% stenosis in mid portion and 65% dRCA stenosis. Systolic dysfunction out of proportion to degree of CAD. RHC showed low filling pressures, mRAP 1, mPAP 15, mPCWP 11 and preserved cardiac output, Fick CO 4.38 L/min, CI 2.59 L/min. He was placed on GDMT but no loop diuretic. LifeVest was also placed for low EF and frequent NSVT.   Unclear etiology of low EF.  Posible viral cardiomyopathy as he had suspected PNA 3-4 weeks prior to HF admission in 2/23. Also ?familial given FH. 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 No SGLTi due to recurrent UTI.    Patient was admitted in 3/23 with lightheadedness and weakness.  The day prior to admission, he had a worse episode of dizziness with palpitations and extreme weakness while doing weed-eating.  Few seconds later he had a noise (beeping) from LifeVest.  He sat down but symptoms persisted and had 2 more episodes of noise from LifeVest. Denied chest pain, shortness of breath or syncope.  He went to [facility] where he was in atrial fibrillation with rapid ventricular rate of 130s.  Placed on IV amiodarone, transferred to [facility].  Also of note, creatinine was up to 3.32.  PICC was placed, patient was  noted to have low co-ox and started on milrinone.  He was diuresed.  He ultimately had TEE-guided DCCV back to NSR.  TEE showed EF 20%, moderate LV dilation, mildly decreased RV systolic function. Cardiac MRI was done, showing severely dilated LV with diffuse hypokinesis EF 24%, normal RV size with mildly decreased systolic function EF 45%; delayed enhancement images show coronary-pattern LGE in a circumflex coronary artery distribution, suspect prior MI involving the LCx. Creatinine trended down, milrinone was stopped.    Echo in 9/23 showed EF 35-40%, basal inferior/basal anterolateral/basal-mid inferolateral akinesis, normal RV.   Cardiolite was done in 1/24 due to chest pain, EF 34% with old inferolateral MI, no ischemia.   Patient returns for followup of CHF.  Atrial fibrillation ablation was rescheduled as he had missed several doses of Eliquis prior.  No palpitations, he is in NSR today.  BP is elevated today.  No further chest pain. No significant exertional dyspnea, he can climb up stairs and do other activities of daily living without dyspnea.  No orthopnea/PND.    ECG (personally reviewed): NSR, LVH with repolarization abnormality, PVC.   Labs (3/23): K 4.2, creatinine 2.83 => 1.9 Labs (5/23): K 3.8, creatinine 1.96, free T3 decreased, free T4 high, TSH low, digoxin 1.3 Labs (11/23): LFTs normal Labs (1/24): K 4.4, creatinine 1.78 Labs (2/24): K 4.3, SCr 1.93  Past Medical History: 1. CAD: PCI to LCx 2000, PCI LCx 9/14.   - LHC (2/23): 70%  mRCA, 40% mLAD.  No intervention.  - Cardiolite (1/24): EF 34% with old inferolateral MI, no ischemia. 2. Cardiomyopathy: Suspect predominantly nonischemic.  Echo in 2020 with EF 50%.  Echo in 2/23 with EF < 20%, moderate-severe LV dilation, mild RVE with mildly decreased RV systolic function.   - RHC (2/23): mean RA 1, PA 24/10, mean PCWP 11, CI 2.59 - TEE (3/23) showed EF 20%, moderate LV dilation, mildly decreased RV systolic function.  -  Cardiac MRI (3/23) showed severely dilated LV with diffuse hypokinesis EF 24%, normal RV size with mildly decreased systolic function EF 45%; delayed enhancement images show coronary-pattern LGE in a circumflex coronary artery distribution, suspect prior MI involving the LCx.  - Echo (9/23): EF 35-40%, basal inferior/basal anterolateral/basal-mid inferolateral akinesis, normal RV.  3. CKD stage 3 4. H/o UTIs 5. Atrial fibrillation: Persistent, noted in 9/22.  - TEE-guided DCCV in 3/23.  6. H/o Graves disease with hyperthyroidism s/p iodine thyroid ablation.  7. HTN.  8. Prior smoker 9. NSVT 10. Thrombocytopenia: Mild, chronic.   Social History   Socioeconomic History   Marital status: Married    Spouse name: Fleet Contras   Number of children: Not on file   Years of education: 11th grade   Highest education level: 11th grade  Occupational History   Occupation: Merchandiser, retail  Tobacco Use   Smoking status: Every Day    Packs/day: 0.50    Years: 53.00    Additional pack years: 0.00    Total pack years: 26.50    Types: Cigarettes    Start date: 12/09/1965   Smokeless tobacco: Never   Tobacco comments:    3/4 pack of cigarettes daily; smoked since 77 years old  Vaping Use   Vaping Use: Never used  Substance and Sexual Activity   Alcohol use: No    Alcohol/week: 0.0 standard drinks of alcohol    Comment: No bowel since year 2000.  Some previous heavy use.   Drug use: No   Sexual activity: Yes    Birth control/protection: None  Other Topics Concern   Not on file  Social History Narrative   Pt gets regular exercise.Married for 22 years.Retired but still Engineering geologist.   Social Determinants of Health   Financial Resource Strain: Low Risk  (01/23/2022)   Overall Financial Resource Strain (CARDIA)    Difficulty of Paying Living Expenses: Not very hard  Food Insecurity: No Food Insecurity (01/23/2022)   Hunger Vital Sign    Worried About Running Out of Food in the  Last Year: Never true    Ran Out of Food in the Last Year: Never true  Transportation Needs: No Transportation Needs (01/23/2022)   PRAPARE - Administrator, Civil Service (Medical): No    Lack of Transportation (Non-Medical): No  Physical Activity: Sufficiently Active (08/23/2019)   Exercise Vital Sign    Days of Exercise per Week: 5 days    Minutes of Exercise per Session: 30 min  Stress: No Stress Concern Present (08/23/2019)   [employer] of Occupational Health - Occupational Stress Questionnaire    Feeling of Stress : Not at all  Social Connections: Not on file  Intimate Partner Violence: Not on file   Family History  Problem Relation Age of Onset   Early death Mother        childbirth   Heart attack Father    Cancer Father    Heart disease Brother        CABG  Heart disease Brother        slight heart attack   Anesthesia problems Neg Hx    Hypotension Neg Hx    Malignant hyperthermia Neg Hx    Pseudochol deficiency Neg Hx    Colon cancer Neg Hx    Liver disease Neg Hx    Gastric cancer Neg Hx    Esophageal cancer Neg Hx    ROS: All systems reviewed and negative except as per HPI.   Current Outpatient Medications  Medication Sig Dispense Refill   amiodarone (PACERONE) 200 MG tablet Take 1 tablet (200 mg total) by mouth daily. 90 tablet 3   apixaban (ELIQUIS) 5 MG TABS tablet TAKE 1 TABLET(5 MG) BY MOUTH TWICE DAILY 180 tablet 1   atorvastatin (LIPITOR) 80 MG tablet Take 1 tablet (80 mg total) by mouth daily. 90 tablet 3   Cholecalciferol (VITAMIN D-3) 125 MCG (5000 UT) TABS Take 1 tablet by mouth once a week.     digoxin (LANOXIN) 0.125 MG tablet Take 0.5 tablets (62.5 mcg total) by mouth every other day. 45 tablet 3   hydrALAZINE (APRESOLINE) 50 MG tablet Take 1 tablet (50 mg total) by mouth 3 (three) times daily. 270 tablet 2   isosorbide mononitrate (IMDUR) 60 MG 24 hr tablet Take 1 tablet (60 mg total) by mouth daily. 90 tablet 3   mineral  oil-hydrophilic petrolatum (AQUAPHOR) ointment Apply 1 Application topically daily as needed for dry skin or irritation.     nitroGLYCERIN (NITROSTAT) 0.4 MG SL tablet Place 1 tablet (0.4 mg total) under the tongue every 5 (five) minutes as needed for chest pain. 25 tablet 3   pantoprazole (PROTONIX) 40 MG tablet Take 1 tablet (40 mg total) by mouth daily. 90 tablet 3   potassium chloride (KLOR-CON) 10 MEQ tablet Take 1 tablet (10 mEq total) by mouth daily. 90 tablet 3   sacubitril-valsartan (ENTRESTO) 49-51 MG Take 1 tablet by mouth 2 (two) times daily. 60 tablet 6   spironolactone (ALDACTONE) 25 MG tablet Take 0.5 tablets (12.5 mg total) by mouth daily. 45 tablet 2   No current facility-administered medications for this visit.   There were no vitals taken for this visit.  Wt Readings from Last 3 Encounters:  02/19/23 61.5 kg (135 lb 9.6 oz)  01/02/23 61.3 kg (135 lb 3.2 oz)  12/09/22 61.2 kg (135 lb)    General:  *** appearing.  No respiratory difficulty HEENT: normal Neck: supple. JVD *** cm. Carotids 2+ bilat; no bruits. No lymphadenopathy or thyromegaly appreciated. Cor: PMI nondisplaced. Regular rate & rhythm. No rubs, gallops or murmurs. Lungs: clear Abdomen: soft, nontender, nondistended. No hepatosplenomegaly. No bruits or masses. Good bowel sounds. Extremities: no cyanosis, clubbing, rash, edema  Neuro: alert & oriented x 3, cranial nerves grossly intact. moves all 4 extremities w/o difficulty. Affect pleasant.   Assessment/Plan: 1. Chronic systolic CHF: Suspect primarily nonischemic cardiomyopathy.  Echo in 2020 with EF 50%.  Echo in 2/23 with EF < 20%, moderate-severe LV dilation, mild RVE with mildly decreased RV systolic function.  Certainly possible this could be a tachy-mediated CMP given atrial fibrillation with RVR noted at admission in 3/23.  TEE showed EF 20%, moderate LV dilation, mildly decreased RV systolic function. Cardiac MRI was done, showing severely dilated LV  with diffuse hypokinesis EF 24%, normal RV size with mildly decreased systolic function EF 45%; delayed enhancement images show coronary-pattern LGE in a circumflex coronary artery distribution, suspect prior MI involving the LCx, though do  not think this can explain the extent of the cardiomyopathy.  Echo in 9/23 showed improved EF 35-40%, basal inferior/basal anterolateral/basal-mid inferolateral akinesis, normal RV.  I suspect that he had a tachycardia-mediated cardiomyopathy superimposed on ischemic cardiomyopathy, EF improved in NSR but still with wall motion abnormalities consistent with CAD.  Doing well currently, NYHA class I.  He is not volume overloaded on exam.   - He does not appear to need a diuretic. *** - Continue digoxin 0.0625 daily.  Check digoxin level.   - Start Entresto 24/26 mg twice a day.  BMET today and again in 10 days.  - Continue spironolactone 12.5 daily.   - Hold beta blocker with bradycardia in setting of amiodarone use (HR 50s today).   - Continue hydralazine 50 mg tid and Imdur 60 mg daily.  - No SGLT2 inhibitor for now with h/o UTIs.  - With EF up to 35-40%, will defer ICD for now.  2. Atrial fibrillation: TEE/DCCV to NSR in 3/23.  Possible component of tachycardia-mediated cardiomyopathy.  He is in NSR today.  - Continue amiodarone 200 mg daily. Check LFTs, thyroid studies followed by endocrinology (h/o hyperthyroidism/Graves disease)  Will need regular eye exam.    - Continue Eliquis.  - Atrial fibrillation ablation has been rescheduled, patient thinks for March.  Hopefully, can stop amiodarone after ablation. *** 3. NSVT:  On amiodarone.  4. CKD stage 3: BMET today.  5. CAD: PCI to LCx remotely.  LHC in 2/23 with nonobstructive CAD.  Wall motion abnormalities on 9/23 are consistent with LCx territory MI, as was the cardiac MRI.  Cardiolite in 1/24 showed prior inferolateral MI, no ischemia. No chest pain.  - No ASA given Eliquis use.  - Continue atorvastatin,  check lipids today.  6.  H/o Graves disease: Followed by endocrinology.  Will need to watch thyroid closely with amiodarone use.   Followup in 3 months with APP.   Alen Bleacher 04/01/2023

## 2023-04-02 ENCOUNTER — Ambulatory Visit (HOSPITAL_COMMUNITY)
Admission: RE | Admit: 2023-04-02 | Discharge: 2023-04-02 | Disposition: A | Discharge status: Home / Self Care | Payer: Medicare HMO | Source: Ambulatory Visit | Attending: Internal Medicine | Admitting: Internal Medicine

## 2023-04-02 ENCOUNTER — Encounter (HOSPITAL_COMMUNITY): Payer: Self-pay

## 2023-04-02 VITALS — BP 152/62 | HR 55 | Wt 133.6 lb

## 2023-04-02 DIAGNOSIS — I4891 Unspecified atrial fibrillation: Secondary | ICD-10-CM | POA: Diagnosis not present

## 2023-04-02 DIAGNOSIS — I5022 Chronic systolic (congestive) heart failure: Secondary | ICD-10-CM | POA: Insufficient documentation

## 2023-04-02 DIAGNOSIS — I429 Cardiomyopathy, unspecified: Secondary | ICD-10-CM | POA: Insufficient documentation

## 2023-04-02 DIAGNOSIS — I493 Ventricular premature depolarization: Secondary | ICD-10-CM | POA: Insufficient documentation

## 2023-04-02 DIAGNOSIS — I252 Old myocardial infarction: Secondary | ICD-10-CM | POA: Diagnosis not present

## 2023-04-02 DIAGNOSIS — Z79899 Other long term (current) drug therapy: Secondary | ICD-10-CM | POA: Diagnosis not present

## 2023-04-02 DIAGNOSIS — I2510 Atherosclerotic heart disease of native coronary artery without angina pectoris: Secondary | ICD-10-CM

## 2023-04-02 DIAGNOSIS — Z87440 Personal history of urinary (tract) infections: Secondary | ICD-10-CM | POA: Insufficient documentation

## 2023-04-02 DIAGNOSIS — I472 Ventricular tachycardia, unspecified: Secondary | ICD-10-CM | POA: Insufficient documentation

## 2023-04-02 DIAGNOSIS — I130 Hypertensive heart and chronic kidney disease with heart failure and stage 1 through stage 4 chronic kidney disease, or unspecified chronic kidney disease: Secondary | ICD-10-CM | POA: Insufficient documentation

## 2023-04-02 DIAGNOSIS — I4729 Other ventricular tachycardia: Secondary | ICD-10-CM | POA: Diagnosis not present

## 2023-04-02 DIAGNOSIS — Z8639 Personal history of other endocrine, nutritional and metabolic disease: Secondary | ICD-10-CM | POA: Insufficient documentation

## 2023-04-02 DIAGNOSIS — Z955 Presence of coronary angioplasty implant and graft: Secondary | ICD-10-CM | POA: Insufficient documentation

## 2023-04-02 DIAGNOSIS — I4819 Other persistent atrial fibrillation: Secondary | ICD-10-CM | POA: Diagnosis not present

## 2023-04-02 DIAGNOSIS — N183 Chronic kidney disease, stage 3 unspecified: Secondary | ICD-10-CM | POA: Diagnosis not present

## 2023-04-02 DIAGNOSIS — I5042 Chronic combined systolic (congestive) and diastolic (congestive) heart failure: Secondary | ICD-10-CM | POA: Diagnosis not present

## 2023-04-02 DIAGNOSIS — F17210 Nicotine dependence, cigarettes, uncomplicated: Secondary | ICD-10-CM | POA: Diagnosis not present

## 2023-04-02 DIAGNOSIS — Z7901 Long term (current) use of anticoagulants: Secondary | ICD-10-CM | POA: Insufficient documentation

## 2023-04-02 DIAGNOSIS — E0500 Thyrotoxicosis with diffuse goiter without thyrotoxic crisis or storm: Secondary | ICD-10-CM | POA: Diagnosis not present

## 2023-04-02 LAB — CBC
HCT: 39.1 % (ref 39.0–52.0)
Hemoglobin: 13.1 g/dL (ref 13.0–17.0)
MCH: 30.9 pg (ref 26.0–34.0)
MCHC: 33.5 g/dL (ref 30.0–36.0)
MCV: 92.2 fL (ref 80.0–100.0)
Platelets: 171 10*3/uL (ref 150–400)
RBC: 4.24 MIL/uL (ref 4.22–5.81)
RDW: 16.6 % — ABNORMAL HIGH (ref 11.5–15.5)
WBC: 9 10*3/uL (ref 4.0–10.5)
nRBC: 0 % (ref 0.0–0.2)

## 2023-04-02 LAB — BASIC METABOLIC PANEL
Anion gap: 11 (ref 5–15)
BUN: 26 mg/dL — ABNORMAL HIGH (ref 8–23)
CO2: 20 mmol/L — ABNORMAL LOW (ref 22–32)
Calcium: 8.8 mg/dL — ABNORMAL LOW (ref 8.9–10.3)
Chloride: 107 mmol/L (ref 98–111)
Creatinine, Ser: 1.62 mg/dL — ABNORMAL HIGH (ref 0.61–1.24)
GFR, Estimated: 44 mL/min — ABNORMAL LOW (ref >60)
Glucose, Bld: 84 mg/dL (ref 70–99)
Potassium: 4 mmol/L (ref 3.5–5.1)
Sodium: 138 mmol/L (ref 135–145)

## 2023-04-02 MED ORDER — HYDRALAZINE HCL 50 MG PO TABS: 75.0000 mg | ORAL_TABLET | Freq: Three times a day (TID) | ORAL | 2 refills | Status: AC

## 2023-04-02 NOTE — Patient Instructions (Signed)
INCREASE Hydraizine to 75 mg (one and one half tab) three times daily  Labs today We will only contact you if something comes back abnormal or we need to make some changes. Otherwise no news is good news!  Labs needed in 7-10 days  Your physician recommends that you schedule a follow-up appointment in: 3 months  in the Advanced Practitioners (PA/NP) Clinic   Do the following things EVERYDAY: Weigh yourself in the morning before breakfast. Write it down and keep it in a log. Take your medicines as prescribed Eat low salt foods--Limit salt (sodium) to 2000 mg per day.  Stay as active as you can everyday Limit all fluids for the day to less than 2 liters  At the [facility] Clinic, you and your health needs are our priority. As part of our continuing mission to provide you with exceptional heart care, we have created designated Provider Care Teams. These Care Teams include your primary Cardiologist (physician) and Advanced Practice Providers (APPs- Physician Assistants and Nurse Practitioners) who all work together to provide you with the care you need, when you need it.   You may see any of the following providers on your designated Care Team at your next follow up: Dr Arvilla Meres Dr Marca Ancona Dr. Marcos Eke, NP Robbie Lis, [place] [facility] [place], [place] Brynda Peon, NP Karle Plumber, PharmD   Please be sure to bring in all your medications bottles to every appointment.    Thank you for choosing [facility]   If you have any questions or concerns before your next appointment please send [place] a message through [place] or call our office at [phone].    TO LEAVE A MESSAGE FOR THE NURSE SELECT OPTION 2, PLEASE LEAVE A MESSAGE INCLUDING: YOUR NAME DATE OF BIRTH CALL BACK NUMBER REASON FOR CALL**this is important as we prioritize the call backs  YOU WILL RECEIVE A CALL BACK THE SAME DAY AS  LONG AS YOU CALL BEFORE 4:00 PM

## 2023-04-03 ENCOUNTER — Other Ambulatory Visit: Payer: Self-pay | Admitting: Cardiology

## 2023-04-03 NOTE — Telephone Encounter (Signed)
Prescription refill request for Eliquis received. Indication: AF Last office visit: 02/19/23  R Keitha Butte PA-C Scr: 1.62 on 04/02/23 Age: 77 Weight: 61.5kg  Based on above findings Eliquis 5mg  twice daily is the appropriate dose.  Refill approved.

## 2023-04-09 DIAGNOSIS — I5042 Chronic combined systolic (congestive) and diastolic (congestive) heart failure: Secondary | ICD-10-CM | POA: Diagnosis not present

## 2023-04-25 DIAGNOSIS — I4891 Unspecified atrial fibrillation: Secondary | ICD-10-CM | POA: Diagnosis not present

## 2023-04-25 DIAGNOSIS — I10 Essential (primary) hypertension: Secondary | ICD-10-CM | POA: Diagnosis not present

## 2023-04-28 ENCOUNTER — Other Ambulatory Visit: Payer: Self-pay

## 2023-04-28 DIAGNOSIS — I10 Essential (primary) hypertension: Secondary | ICD-10-CM

## 2023-04-28 DIAGNOSIS — E785 Hyperlipidemia, unspecified: Secondary | ICD-10-CM

## 2023-04-28 DIAGNOSIS — K2100 Gastro-esophageal reflux disease with esophagitis, without bleeding: Secondary | ICD-10-CM

## 2023-04-28 MED ORDER — PANTOPRAZOLE SODIUM 40 MG PO TBEC
40.0000 mg | DELAYED_RELEASE_TABLET | Freq: Every day | ORAL | 3 refills | Status: DC
Start: 2023-04-28 — End: 2024-06-22

## 2023-04-28 NOTE — Telephone Encounter (Signed)
Pt's wife is calling requesting a refill on pantoprazole. This is Dr. Verna Czech pt. Please address

## 2023-05-01 NOTE — Progress Notes (Signed)
  Electrophysiology Office Follow up Visit Note:    Date:  05/02/2023   ID:  Ronald Mcdowell, DOB [birth year], MRN [id number]  PCP:  Toma Deiters, MD  [facility] Cardiologist:  Dina Rich, MD  [facility] Electrophysiologist:  Lanier Prude, MD    Interval History:    Ronald Mcdowell is a 77 y.o. male who presents for a follow up visit.   Last seen 08/05/2022. At that appointment we discussed his AF and HF. We planned AF ablation and watchman.  He was not taking his Eliquis as prescribed and for that reason his procedures were cancelled.   He is with his wife today in clinic.  He reports very poor appetite and consistent weight loss.  No episodes of atrial fibrillation.    Past medical, surgical, social and family history were reviewed.  ROS:   Please see the history of present illness.    All other systems reviewed and are negative.  EKGs/Labs/Other Studies Reviewed:    The following studies were reviewed today:  Today's EKG shows sinus bradycardia with a ventricular rate of 50 bpm.  Physical Exam:    VS:  BP (!) 142/78   Pulse (!) 50   Ht 5\' 5"  (1.651 m)   Wt 129 lb 3.2 oz (58.6 kg)   SpO2 97%   BMI 21.50 kg/m     Wt Readings from Last 3 Encounters:  05/02/23 129 lb 3.2 oz (58.6 kg)  04/02/23 133 lb 9.6 oz (60.6 kg)  02/19/23 135 lb 9.6 oz (61.5 kg)     GEN:  Well nourished, well developed in no acute distress CARDIAC: RRR, no murmurs, rubs, gallops RESPIRATORY:  Clear to auscultation without rales, wheezing or rhonchi       ASSESSMENT:    1. Chronic combined systolic and diastolic heart failure (HCC)   2. Persistent atrial fibrillation (HCC)   3. Encounter for long-term (current) use of high-risk medication    PLAN:    In order of problems listed above:  #Weight loss Unclear cause.  It could be related to medications although it would be difficult to tease out which one is the main contributor.  He tells me that he is up-to-date with  his cancer screenings.  Was a prior smoker.  Has follow-up with his primary care on Monday to discuss further.  #Chronic systolic heart failure NYHA II today. Warm and dry on exam. Follows with the HF clinic. Continue GDMT.  #AF #High risk med monitoring - amiodarone Maintaining sinus rhythm on amiodarone. February TSH, ALT, AST WNL. Stop digoxin to simplify his medication regimen. Currently taking Eliquis.  I did provide him some financial assistance paperwork to see if he can get some assistance from the drug manufacture.  He will reach out to [place] if he does not qualify so that we can start Coumadin.  Follow-up 6 months with APP for amiodarone monitoring.   Signed, Steffanie Dunn, MD, [facility], [facility] 05/02/2023 4:52 PM    Electrophysiology [facility]

## 2023-05-02 ENCOUNTER — Other Ambulatory Visit (HOSPITAL_COMMUNITY): Payer: Self-pay | Admitting: Cardiology

## 2023-05-02 ENCOUNTER — Ambulatory Visit: Payer: Medicare HMO | Attending: Cardiology | Admitting: Cardiology

## 2023-05-02 ENCOUNTER — Encounter: Payer: Self-pay | Admitting: Cardiology

## 2023-05-02 VITALS — BP 142/78 | HR 50 | Ht 65.0 in | Wt 129.2 lb

## 2023-05-02 DIAGNOSIS — I5042 Chronic combined systolic (congestive) and diastolic (congestive) heart failure: Secondary | ICD-10-CM | POA: Diagnosis not present

## 2023-05-02 DIAGNOSIS — I4819 Other persistent atrial fibrillation: Secondary | ICD-10-CM | POA: Diagnosis not present

## 2023-05-02 DIAGNOSIS — Z79899 Other long term (current) drug therapy: Secondary | ICD-10-CM | POA: Diagnosis not present

## 2023-05-02 NOTE — Patient Instructions (Signed)
Medication Instructions:  Your physician has recommended you make the following change in your medication:  1) STOP taking digoxin  *If you need a refill on your cardiac medications before your next appointment, please call your pharmacy*  Let us know if you have trouble getting patient assistance for Eliquis and if you would like to switch to coumadin  Follow-Up: At [facility], you and your health needs are our priority.  As part of our continuing mission to provide you with exceptional heart care, we have created designated Provider Care Teams.  These Care Teams include your primary Cardiologist (physician) and Advanced Practice Providers (APPs -  Physician Assistants and Nurse Practitioners) who all work together to provide you with the care you need, when you need it.   Your next appointment:   6 month  Provider:   You will see one of the following Advanced Practice Providers on your designated Care Team:   Francis Dowse, [place]" [place], [place] Sherie Don, NP

## 2023-05-05 DIAGNOSIS — R300 Dysuria: Secondary | ICD-10-CM | POA: Diagnosis not present

## 2023-05-05 DIAGNOSIS — N390 Urinary tract infection, site not specified: Secondary | ICD-10-CM | POA: Diagnosis not present

## 2023-05-05 DIAGNOSIS — B379 Candidiasis, unspecified: Secondary | ICD-10-CM | POA: Diagnosis not present

## 2023-05-05 DIAGNOSIS — I10 Essential (primary) hypertension: Secondary | ICD-10-CM | POA: Diagnosis not present

## 2023-05-05 DIAGNOSIS — Z299 Encounter for prophylactic measures, unspecified: Secondary | ICD-10-CM | POA: Diagnosis not present

## 2023-05-05 DIAGNOSIS — Z0000 Encounter for general adult medical examination without abnormal findings: Secondary | ICD-10-CM | POA: Diagnosis not present

## 2023-05-19 ENCOUNTER — Telehealth: Payer: Self-pay | Admitting: Cardiology

## 2023-05-19 NOTE — Telephone Encounter (Signed)
Pt c/o medication issue:  1. Name of Medication:   ELIQUIS 5 MG TABS tablet    2. How are you currently taking this medication (dosage and times per day)? TAKE 1 TABLET BY MOUTH TWICE DAILY   3. Are you having a reaction (difficulty breathing--STAT)? No   4. What is your medication issue? Pt calling to follow up on pt assistance paperwork that was dropped off for above medication  Patient calling the office for samples of medication:   1.  What medication and dosage are you requesting samples for? ELIQUIS 5 MG TABS tablet   2.  Are you currently out of this medication?   Yes

## 2023-05-19 NOTE — Telephone Encounter (Signed)
Ok to keep on 5mg  BID for now - weight on 05/22/23 is the only weight on file that's < 60kg, all of his other ones are > 60kg. If weight continues to trend below 60kg, agree with reducing dose at that time.

## 2023-05-19 NOTE — Telephone Encounter (Signed)
Ok to give Eliquis samples if available.

## 2023-05-19 NOTE — Telephone Encounter (Signed)
Prescription refill request for Eliquis received. Indication: AF Last office visit: 05/02/23  Jeanie Cooks MD Scr: 1.62 Age: 77 Weight: 58.6kg  Based on above findings Eliquis 2.5mg  twice daily would the the appropriate dose based on weight and Scr.  Message sent to Pharm D for verification.

## 2023-05-23 ENCOUNTER — Other Ambulatory Visit: Payer: Self-pay | Admitting: *Deleted

## 2023-05-23 MED ORDER — APIXABAN 5 MG PO TABS: 5.0000 mg | ORAL_TABLET | Freq: Two times a day (BID) | ORAL | 0 refills | Status: DC

## 2023-05-27 NOTE — Telephone Encounter (Signed)
Signed paperwork faxed ?

## 2023-05-27 NOTE — Telephone Encounter (Signed)
**Note De-Identified Aramis Zobel Obfuscation** I have completed the providers page of the pts BMSPAF application for Eliquis assistance with our DOD, Dr Hoyle Barr information as Dr Lalla Brothers will not be back in the office again unto 7/11, and I have e-mailed all to Dr Hoyle Barr nurse so she can obtain his signature, date it and to then fax all to [facility] at the fax number written on the cover letter included.

## 2023-05-27 NOTE — Telephone Encounter (Signed)
Ronald Mcdowell med assistance form at front desk.

## 2023-05-27 NOTE — Telephone Encounter (Signed)
Pt's patient assistance application was scanned to [facility], [employer]. FYI

## 2023-06-03 DIAGNOSIS — Z0279 Encounter for issue of other medical certificate: Secondary | ICD-10-CM

## 2023-06-03 NOTE — Telephone Encounter (Signed)
**Note De-Identified Ashtian Villacis Obfuscation** Letter received from [facility] Tarrin Menn fax stating that the pt is not eligible for approval for Eliquis assistance due to documentation of 3% out of pocket RX expenses, based on household adjusted gross income, not met. [id number]  The letter states that they have notified the pt of this denial as well.

## 2023-06-04 ENCOUNTER — Telehealth: Payer: Self-pay | Admitting: Cardiology

## 2023-06-04 NOTE — Telephone Encounter (Signed)
Pt dropped off FMLA forms for Dr. Wyline Mood to complete  Paid $29 fee w/ cash  Forms signed

## 2023-06-10 DIAGNOSIS — I129 Hypertensive chronic kidney disease with stage 1 through stage 4 chronic kidney disease, or unspecified chronic kidney disease: Secondary | ICD-10-CM | POA: Diagnosis not present

## 2023-06-10 DIAGNOSIS — G629 Polyneuropathy, unspecified: Secondary | ICD-10-CM | POA: Diagnosis not present

## 2023-06-10 DIAGNOSIS — E785 Hyperlipidemia, unspecified: Secondary | ICD-10-CM | POA: Diagnosis not present

## 2023-06-10 DIAGNOSIS — K219 Gastro-esophageal reflux disease without esophagitis: Secondary | ICD-10-CM | POA: Diagnosis not present

## 2023-06-10 DIAGNOSIS — Z7982 Long term (current) use of aspirin: Secondary | ICD-10-CM | POA: Diagnosis not present

## 2023-06-10 DIAGNOSIS — Z7901 Long term (current) use of anticoagulants: Secondary | ICD-10-CM | POA: Diagnosis not present

## 2023-06-10 DIAGNOSIS — G589 Mononeuropathy, unspecified: Secondary | ICD-10-CM | POA: Diagnosis not present

## 2023-06-10 DIAGNOSIS — N189 Chronic kidney disease, unspecified: Secondary | ICD-10-CM | POA: Diagnosis not present

## 2023-06-10 DIAGNOSIS — F17210 Nicotine dependence, cigarettes, uncomplicated: Secondary | ICD-10-CM | POA: Diagnosis not present

## 2023-06-10 DIAGNOSIS — I2510 Atherosclerotic heart disease of native coronary artery without angina pectoris: Secondary | ICD-10-CM | POA: Diagnosis not present

## 2023-06-16 NOTE — Telephone Encounter (Signed)
Dr. Wyline Mood signed forms and returned them to [facility] office. Forms have been faxed and Pt notified they are available for pick up at our [place] location. Scanned and sent to batch.

## 2023-06-20 DIAGNOSIS — I4891 Unspecified atrial fibrillation: Secondary | ICD-10-CM | POA: Diagnosis not present

## 2023-06-20 DIAGNOSIS — I10 Essential (primary) hypertension: Secondary | ICD-10-CM | POA: Diagnosis not present

## 2023-06-20 DIAGNOSIS — Z299 Encounter for prophylactic measures, unspecified: Secondary | ICD-10-CM | POA: Diagnosis not present

## 2023-07-03 ENCOUNTER — Encounter (HOSPITAL_COMMUNITY): Payer: Medicare HMO

## 2023-07-21 DIAGNOSIS — I10 Essential (primary) hypertension: Secondary | ICD-10-CM | POA: Diagnosis not present

## 2023-07-21 DIAGNOSIS — N481 Balanitis: Secondary | ICD-10-CM | POA: Diagnosis not present

## 2023-07-21 DIAGNOSIS — Z299 Encounter for prophylactic measures, unspecified: Secondary | ICD-10-CM | POA: Diagnosis not present

## 2023-07-26 DIAGNOSIS — F17210 Nicotine dependence, cigarettes, uncomplicated: Secondary | ICD-10-CM | POA: Diagnosis not present

## 2023-07-26 DIAGNOSIS — N189 Chronic kidney disease, unspecified: Secondary | ICD-10-CM | POA: Diagnosis not present

## 2023-07-26 DIAGNOSIS — B358 Other dermatophytoses: Secondary | ICD-10-CM | POA: Diagnosis not present

## 2023-07-26 DIAGNOSIS — Z7982 Long term (current) use of aspirin: Secondary | ICD-10-CM | POA: Diagnosis not present

## 2023-07-26 DIAGNOSIS — I2510 Atherosclerotic heart disease of native coronary artery without angina pectoris: Secondary | ICD-10-CM | POA: Diagnosis not present

## 2023-07-26 DIAGNOSIS — I129 Hypertensive chronic kidney disease with stage 1 through stage 4 chronic kidney disease, or unspecified chronic kidney disease: Secondary | ICD-10-CM | POA: Diagnosis not present

## 2023-07-26 DIAGNOSIS — E785 Hyperlipidemia, unspecified: Secondary | ICD-10-CM | POA: Diagnosis not present

## 2023-07-26 DIAGNOSIS — N4822 Cellulitis of corpus cavernosum and penis: Secondary | ICD-10-CM | POA: Diagnosis not present

## 2023-07-26 DIAGNOSIS — Z7901 Long term (current) use of anticoagulants: Secondary | ICD-10-CM | POA: Diagnosis not present

## 2023-08-18 DIAGNOSIS — I255 Ischemic cardiomyopathy: Secondary | ICD-10-CM | POA: Diagnosis not present

## 2023-08-18 DIAGNOSIS — F17200 Nicotine dependence, unspecified, uncomplicated: Secondary | ICD-10-CM | POA: Diagnosis not present

## 2023-08-18 DIAGNOSIS — E785 Hyperlipidemia, unspecified: Secondary | ICD-10-CM | POA: Diagnosis not present

## 2023-08-18 DIAGNOSIS — Z79899 Other long term (current) drug therapy: Secondary | ICD-10-CM | POA: Diagnosis not present

## 2023-08-18 DIAGNOSIS — Z7982 Long term (current) use of aspirin: Secondary | ICD-10-CM | POA: Diagnosis not present

## 2023-08-18 DIAGNOSIS — K5900 Constipation, unspecified: Secondary | ICD-10-CM | POA: Diagnosis not present

## 2023-08-18 DIAGNOSIS — I700 Atherosclerosis of aorta: Secondary | ICD-10-CM | POA: Diagnosis not present

## 2023-08-18 DIAGNOSIS — I2510 Atherosclerotic heart disease of native coronary artery without angina pectoris: Secondary | ICD-10-CM | POA: Diagnosis not present

## 2023-08-18 DIAGNOSIS — I129 Hypertensive chronic kidney disease with stage 1 through stage 4 chronic kidney disease, or unspecified chronic kidney disease: Secondary | ICD-10-CM | POA: Diagnosis not present

## 2023-08-18 DIAGNOSIS — N189 Chronic kidney disease, unspecified: Secondary | ICD-10-CM | POA: Diagnosis not present

## 2023-09-07 ENCOUNTER — Other Ambulatory Visit (HOSPITAL_COMMUNITY): Payer: Self-pay | Admitting: Family Medicine

## 2023-09-18 DIAGNOSIS — M542 Cervicalgia: Secondary | ICD-10-CM | POA: Diagnosis not present

## 2023-09-18 DIAGNOSIS — E785 Hyperlipidemia, unspecified: Secondary | ICD-10-CM | POA: Diagnosis not present

## 2023-09-18 DIAGNOSIS — R0789 Other chest pain: Secondary | ICD-10-CM | POA: Diagnosis not present

## 2023-09-18 DIAGNOSIS — R42 Dizziness and giddiness: Secondary | ICD-10-CM | POA: Diagnosis not present

## 2023-09-18 DIAGNOSIS — N189 Chronic kidney disease, unspecified: Secondary | ICD-10-CM | POA: Diagnosis not present

## 2023-09-18 DIAGNOSIS — R531 Weakness: Secondary | ICD-10-CM | POA: Diagnosis not present

## 2023-09-18 DIAGNOSIS — R079 Chest pain, unspecified: Secondary | ICD-10-CM | POA: Diagnosis not present

## 2023-09-18 DIAGNOSIS — I2510 Atherosclerotic heart disease of native coronary artery without angina pectoris: Secondary | ICD-10-CM | POA: Diagnosis not present

## 2023-09-18 DIAGNOSIS — R202 Paresthesia of skin: Secondary | ICD-10-CM | POA: Diagnosis not present

## 2023-09-18 DIAGNOSIS — R519 Headache, unspecified: Secondary | ICD-10-CM | POA: Diagnosis not present

## 2023-09-18 DIAGNOSIS — I6782 Cerebral ischemia: Secondary | ICD-10-CM | POA: Diagnosis not present

## 2023-09-18 DIAGNOSIS — I129 Hypertensive chronic kidney disease with stage 1 through stage 4 chronic kidney disease, or unspecified chronic kidney disease: Secondary | ICD-10-CM | POA: Diagnosis not present

## 2023-09-26 DIAGNOSIS — I10 Essential (primary) hypertension: Secondary | ICD-10-CM | POA: Diagnosis not present

## 2023-09-26 DIAGNOSIS — R42 Dizziness and giddiness: Secondary | ICD-10-CM | POA: Diagnosis not present

## 2023-09-26 DIAGNOSIS — Z2821 Immunization not carried out because of patient refusal: Secondary | ICD-10-CM | POA: Diagnosis not present

## 2023-09-26 DIAGNOSIS — Z299 Encounter for prophylactic measures, unspecified: Secondary | ICD-10-CM | POA: Diagnosis not present

## 2023-11-09 ENCOUNTER — Other Ambulatory Visit: Payer: Self-pay | Admitting: Physician Assistant

## 2023-11-10 DIAGNOSIS — Z7189 Other specified counseling: Secondary | ICD-10-CM | POA: Diagnosis not present

## 2023-11-10 DIAGNOSIS — Z0000 Encounter for general adult medical examination without abnormal findings: Secondary | ICD-10-CM | POA: Diagnosis not present

## 2023-11-10 DIAGNOSIS — Z1339 Encounter for screening examination for other mental health and behavioral disorders: Secondary | ICD-10-CM | POA: Diagnosis not present

## 2023-11-10 DIAGNOSIS — Z1331 Encounter for screening for depression: Secondary | ICD-10-CM | POA: Diagnosis not present

## 2023-11-10 DIAGNOSIS — I10 Essential (primary) hypertension: Secondary | ICD-10-CM | POA: Diagnosis not present

## 2023-11-10 DIAGNOSIS — Z299 Encounter for prophylactic measures, unspecified: Secondary | ICD-10-CM | POA: Diagnosis not present

## 2023-11-10 DIAGNOSIS — E7800 Pure hypercholesterolemia, unspecified: Secondary | ICD-10-CM | POA: Diagnosis not present

## 2023-11-10 DIAGNOSIS — Z79899 Other long term (current) drug therapy: Secondary | ICD-10-CM | POA: Diagnosis not present

## 2023-11-10 DIAGNOSIS — Z125 Encounter for screening for malignant neoplasm of prostate: Secondary | ICD-10-CM | POA: Diagnosis not present

## 2023-11-10 DIAGNOSIS — F17210 Nicotine dependence, cigarettes, uncomplicated: Secondary | ICD-10-CM | POA: Diagnosis not present

## 2023-12-02 ENCOUNTER — Other Ambulatory Visit (HOSPITAL_COMMUNITY): Payer: Self-pay | Admitting: Nephrology

## 2023-12-02 DIAGNOSIS — N184 Chronic kidney disease, stage 4 (severe): Secondary | ICD-10-CM | POA: Diagnosis not present

## 2023-12-02 DIAGNOSIS — I5022 Chronic systolic (congestive) heart failure: Secondary | ICD-10-CM

## 2023-12-02 DIAGNOSIS — D638 Anemia in other chronic diseases classified elsewhere: Secondary | ICD-10-CM

## 2023-12-02 DIAGNOSIS — D631 Anemia in chronic kidney disease: Secondary | ICD-10-CM | POA: Diagnosis not present

## 2023-12-04 DIAGNOSIS — F17210 Nicotine dependence, cigarettes, uncomplicated: Secondary | ICD-10-CM | POA: Diagnosis not present

## 2023-12-04 DIAGNOSIS — I493 Ventricular premature depolarization: Secondary | ICD-10-CM | POA: Diagnosis not present

## 2023-12-04 DIAGNOSIS — R55 Syncope and collapse: Secondary | ICD-10-CM | POA: Diagnosis not present

## 2023-12-04 DIAGNOSIS — R42 Dizziness and giddiness: Secondary | ICD-10-CM | POA: Diagnosis not present

## 2023-12-04 DIAGNOSIS — I255 Ischemic cardiomyopathy: Secondary | ICD-10-CM | POA: Diagnosis not present

## 2023-12-04 DIAGNOSIS — I4821 Permanent atrial fibrillation: Secondary | ICD-10-CM | POA: Diagnosis not present

## 2023-12-04 DIAGNOSIS — I2510 Atherosclerotic heart disease of native coronary artery without angina pectoris: Secondary | ICD-10-CM | POA: Diagnosis not present

## 2023-12-04 DIAGNOSIS — I5042 Chronic combined systolic (congestive) and diastolic (congestive) heart failure: Secondary | ICD-10-CM | POA: Diagnosis not present

## 2023-12-04 DIAGNOSIS — R079 Chest pain, unspecified: Secondary | ICD-10-CM | POA: Diagnosis not present

## 2023-12-04 DIAGNOSIS — E785 Hyperlipidemia, unspecified: Secondary | ICD-10-CM | POA: Diagnosis not present

## 2023-12-04 DIAGNOSIS — R001 Bradycardia, unspecified: Secondary | ICD-10-CM | POA: Diagnosis not present

## 2023-12-23 DIAGNOSIS — R55 Syncope and collapse: Secondary | ICD-10-CM | POA: Diagnosis not present

## 2023-12-23 DIAGNOSIS — R42 Dizziness and giddiness: Secondary | ICD-10-CM | POA: Diagnosis not present

## 2023-12-24 DIAGNOSIS — I454 Nonspecific intraventricular block: Secondary | ICD-10-CM | POA: Diagnosis not present

## 2023-12-24 DIAGNOSIS — I4729 Other ventricular tachycardia: Secondary | ICD-10-CM | POA: Diagnosis not present

## 2023-12-24 DIAGNOSIS — I440 Atrioventricular block, first degree: Secondary | ICD-10-CM | POA: Diagnosis not present

## 2023-12-30 DIAGNOSIS — R768 Other specified abnormal immunological findings in serum: Secondary | ICD-10-CM | POA: Diagnosis not present

## 2023-12-30 DIAGNOSIS — I5022 Chronic systolic (congestive) heart failure: Secondary | ICD-10-CM | POA: Diagnosis not present

## 2023-12-30 DIAGNOSIS — D631 Anemia in chronic kidney disease: Secondary | ICD-10-CM | POA: Diagnosis not present

## 2023-12-30 DIAGNOSIS — N184 Chronic kidney disease, stage 4 (severe): Secondary | ICD-10-CM | POA: Diagnosis not present

## 2024-01-15 ENCOUNTER — Encounter: Payer: Self-pay | Admitting: Nurse Practitioner

## 2024-01-15 ENCOUNTER — Ambulatory Visit: Payer: Medicare HMO | Attending: Nurse Practitioner | Admitting: Nurse Practitioner

## 2024-01-15 VITALS — BP 128/80 | HR 60 | Ht 65.0 in | Wt 141.0 lb

## 2024-01-15 DIAGNOSIS — I4729 Other ventricular tachycardia: Secondary | ICD-10-CM

## 2024-01-15 DIAGNOSIS — G8929 Other chronic pain: Secondary | ICD-10-CM | POA: Diagnosis not present

## 2024-01-15 DIAGNOSIS — I25119 Atherosclerotic heart disease of native coronary artery with unspecified angina pectoris: Secondary | ICD-10-CM

## 2024-01-15 DIAGNOSIS — I4891 Unspecified atrial fibrillation: Secondary | ICD-10-CM | POA: Diagnosis not present

## 2024-01-15 DIAGNOSIS — Z7901 Long term (current) use of anticoagulants: Secondary | ICD-10-CM | POA: Insufficient documentation

## 2024-01-15 DIAGNOSIS — R079 Chest pain, unspecified: Secondary | ICD-10-CM | POA: Diagnosis not present

## 2024-01-15 DIAGNOSIS — I5022 Chronic systolic (congestive) heart failure: Secondary | ICD-10-CM

## 2024-01-15 DIAGNOSIS — I10 Essential (primary) hypertension: Secondary | ICD-10-CM

## 2024-01-15 DIAGNOSIS — R9431 Abnormal electrocardiogram [ECG] [EKG]: Secondary | ICD-10-CM

## 2024-01-15 DIAGNOSIS — I428 Other cardiomyopathies: Secondary | ICD-10-CM | POA: Diagnosis not present

## 2024-01-15 DIAGNOSIS — R0989 Other specified symptoms and signs involving the circulatory and respiratory systems: Secondary | ICD-10-CM

## 2024-01-15 DIAGNOSIS — E785 Hyperlipidemia, unspecified: Secondary | ICD-10-CM

## 2024-01-15 DIAGNOSIS — N184 Chronic kidney disease, stage 4 (severe): Secondary | ICD-10-CM

## 2024-01-15 NOTE — Patient Instructions (Addendum)
 Medication Instructions:  Your physician has recommended you make the following change in your medication:  Please stop Amiodarone   Labwork: None   Testing/Procedures: Your physician has requested that you have an echocardiogram. Echocardiography is a painless test that uses sound waves to create images of your heart. It provides your doctor with information about the size and shape of your heart and how well your heart's chambers and valves are working. This procedure takes approximately one hour. There are no restrictions for this procedure. Please do NOT wear cologne, perfume, aftershave, or lotions (deodorant is allowed). Please arrive 15 minutes prior to your appointment time.  Please note: We ask at that you not bring children with you during ultrasound (echo/ vascular) testing. Due to room size and safety concerns, children are not allowed in the ultrasound rooms during exams. Our front office staff cannot provide observation of children in our lobby area while testing is being conducted. An adult accompanying a patient to their appointment will only be allowed in the ultrasound room at the discretion of the ultrasound technician under special circumstances. We apologize for any inconvenience. Your physician has requested that you have a carotid duplex. This test is an ultrasound of the carotid arteries in your neck. It looks at blood flow through these arteries that supply the brain with blood. Allow one hour for this exam. There are no restrictions or special instructions. Your physician has requested that you have a lexiscan myoview. For further information please visit [email or web address] Please follow instruction sheet, as given.  Follow-Up: Your physician recommends that you schedule a follow-up appointment in: 2-3 months   Any Other Special Instructions Will Be Listed Below (If Applicable).  If you need a refill on your cardiac medications before your next appointment, please  call your pharmacy.

## 2024-01-15 NOTE — Progress Notes (Signed)
 Cardiology Office Note:  .   Date:  01/15/2024  ID:  Ronald Mcdowell, DOB 25-Mar-1946, MRN [id number] PCP: Toma Deiters, MD   HeartCare Providers Cardiologist:  Dina Rich, MD Electrophysiologist:  Lanier Prude, MD  Advanced Heart Failure:  Marca Ancona, MD    History of Present Illness: .   Ronald Mcdowell is a very pleasant 78 y.o. male with a past medical history of CAD, HFrEF, NICM, A-fib, NSVT, hypertension, hyperlipidemia, CKD stage IV, and Graves' disease, s/p iodine thyroid ablation, and recurrent UTIs, who presents today for 60-month follow-up appointment.  Followed by electrophysiology and heart failure clinic.  Previous cardiovascular history of stenting to left circumflex in 2014, as well as previous PCI in 2000, in February 2023 he underwent cardiac catheterization-see report below.  Last seen by Dr. Dina Rich on December 09, 2022.  He was overall doing well at the time.  It was noted he did have some chronic dizziness, denied any specific positional symptoms.  Orthostatics were negative in office that day.  In the interim, he has had several ED visits last year.  More recently was seen at [facility] ED in October 2024 for atypical chest pain.  He noted lightheadedness and intermittent paresthesias of right arm, denied any weakness.  Noted to have chest pain earlier that day, pain had resolved upon presentation to ED.  Workup was reassuring.  CT of the head was negative for anything acute, did show mild chronic small vessel ischemic changes within the cerebral white matter.  Was given Rx for meclizine.  More recently, seen at [facility] ED on December 04, 2023 for presyncopal episode.  Patient noted significant dizziness with feelings of near syncope that morning and bradycardia noted in ED.  Was noted to be bradycardic.  Heart rate in 40s when he arrived.  14-day ZIO monitor was arranged - see report below.  Today he presents for scheduled 57-month  follow-up appointment.  He states... He notices episodes of chest pain where he describes it as a tightness along left upper side of his chest, causes him to leave work early to go to the emergency department.  This has been chronic and stable for a while.  He says his past workup has been reassuring.  He also admits to left arm numbness/stinging sensation, particularly noticed and made worse when laying on his left side. Denies any shortness of breath, palpitations, syncope, presyncope, dizziness, orthopnea, PND, swelling or significant weight changes, acute bleeding, or claudication.  ROS: Negative.  See HPI. SH: Works at [employer] Reviewed: [name]   EKG Interpretation Date/Time:  Thursday January 15 2024 14:21:39 EST Ventricular Rate:  68 PR Interval:  186 QRS Duration:  72 QT Interval:  486 QTC Calculation: 516 R Axis:   88  Text Interpretation: Normal sinus rhythm Minimal voltage criteria for LVH, may be normal variant ( Sokolow-Lyon ) Septal infarct (cited on or before 02-Jan-2023) Lateral infarct , age undetermined Prolonged QT When compared with ECG of 02-Apr-2023 09:42, Lateral infarct is now Present Serial changes of evolving Septal infarct Present Confirmed by Sharlene Dory [phone]) on 01/15/2024 4:04:23 PM   EKG: EKG Interpretation Date/Time:  Thursday January 15 2024 14:21:39 EST Ventricular Rate:  68 PR Interval:  186 QRS Duration:  72 QT Interval:  486 QTC Calculation: 516 R Axis:   88  Text Interpretation: Normal sinus rhythm Minimal voltage criteria for LVH, may be normal variant ( Sokolow-Lyon ) Septal infarct (cited on or  before 02-Jan-2023) Lateral infarct , age undetermined Prolonged QT When compared with ECG of 02-Apr-2023 09:42, Lateral infarct is now Present Serial changes of evolving Septal infarct Present Confirmed by Sharlene Dory [id number]) on 01/15/2024 4:04:23 PM   Cardiac monitor 12/23/2023: Conclusions:  - Ambulatory ECG monitoring was performed from 12/04/23  to 12/17/23, with  ~13 days of analyzable data.  -  The predominant rhythm was sinus rhythm, with sinus rate ranging from 46 to 113 and averaging 62 bpm.  -  First Degree AV Block was present. Intermittent Bundle Branch Block was present.  -  Rare PACs were recorded with no recorded episodes of SVT.  -  Rare PVCs were recorded.  -  3 episodes of asymptomatic wide complex tachycardia occurred, the run with the fastest interval lasting 7 beats with a max rate of 162 bpm and the longest lasting 6 beats with an avg rate of 117 bpm.  -  No pauses >3 seconds or high-grade AV block detected.  -  No atrial fibrillation detected.  -  Patient-initiated recordings/events correlated with sinus rhythm.   Lexiscan 11/2022:   Findings are consistent with infarction. The study is intermediate risk.   No ST deviation was noted.   LV perfusion is abnormal. There is no evidence of ischemia. There is evidence of infarction. Defect 1: There is a medium defect with moderate reduction in uptake present in the mid to basal inferolateral location(s) that is fixed. There is abnormal wall motion in the defect area. Consistent with infarction.   Left ventricular function is abnormal. Global function is moderately reduced. End diastolic cavity size is moderately enlarged.   Prior study available for comparison from 06/10/2013. There are changes compared to prior study. The left ventricular ejection fraction has decreased.   Intermediate risk nuclear perfusion study.   There is an old inferolateral infarction and left ventricular systolic function is moderately depressed.  There is no evidence of reversible ischemia.  Perfusion pattern is similar to the previous study, but the left ventricular systolic function is lower.  Echocardiogram 07/2022:  1. Left ventricular ejection fraction, by estimation, is 35 to 40%. The  left ventricle has moderately decreased function. The left ventricle  demonstrates regional wall motion  abnormalities with basal inferior  akinesis, basal inferolateral akinesis, and  basal to mid inferolateral akinesis. There is mild concentric left  ventricular hypertrophy. Left ventricular diastolic parameters are  consistent with Grade I diastolic dysfunction (impaired relaxation).   2. Right ventricular systolic function is normal. The right ventricular  size is normal. Tricuspid regurgitation signal is inadequate for assessing  PA pressure.   3. Left atrial size was mildly dilated.   4. The mitral valve is normal in structure. No evidence of mitral valve  regurgitation. No evidence of mitral stenosis.   5. The aortic valve is tricuspid. There is mild calcification of the  aortic valve. Aortic valve regurgitation is not visualized. No aortic  stenosis is present.   6. The inferior vena cava is normal in size with greater than 50%  respiratory variability, suggesting right atrial pressure of 3 mmHg.  Cardiac MRI 01/2022: IMPRESSION: 1.  Severely dilated LV with diffuse hypokinesis, EF 24%.   2.  Normal RV size with mildly decreased systolic function, EF 45%.   3. Delayed enhancement images show coronary-pattern LGE in a circumflex coronary artery distribution, suspect prior MI involving the LCx.   I do not think that the amount of scarring from prior MI explains the extent of  his cardiomyopathy. No evidence, however, for myocarditis or infiltrative disease by LGE.   Right and left heart cath 12/2021: CONCLUSIONS: Luminal irregularities in the left main Luminal irregularities with up to 40% eccentric mid LAD Widely patent mid circumflex stent.  Diffuse disease in branches in the still second obtuse marginal. Dominant right coronary, mid eccentric 70% stenosis, distal 65% stenosis. Normal LVEDP Normal capillary wedge pressure Mixed venous O2 saturation 72%   RECOMMENDATIONS: Systolic dysfunction is out of proportion to the degree of coronary disease. Guideline directed  therapy for systolic heart failure. Resume apixaban in 8 hours.       Physical Exam:   VS:  BP 128/80   Pulse 60   Ht 5\' 5"  (1.651 m)   Wt 141 lb (64 kg)   SpO2 98%   BMI 23.46 kg/m    Wt Readings from Last 3 Encounters:  01/15/24 141 lb (64 kg)  05/02/23 129 lb 3.2 oz (58.6 kg)  04/02/23 133 lb 9.6 oz (60.6 kg)    GEN: Well nourished, well developed in no acute distress NECK: No JVD; right carotid bruit noted on exam, no left carotid bruit CARDIAC: S1/S2, RRR, no murmurs, rubs, gallops RESPIRATORY:  Clear to auscultation without rales, wheezing or rhonchi  ABDOMEN: Soft, non-tender, non-distended EXTREMITIES:  No edema; No deformity   ASSESSMENT AND PLAN: .    1.  Chronic chest pain, CAD Admits to some atypical chest pain that has been chronic and stable over time, described as a chest tightness.  Denies any specific triggers.  EKG today does reveal Q waves noted along leads 1 and aVL.  Discussed ischemic evaluation including NST and discussed risk and benefits, and he verbalized understanding is agreeable to proceed.  Continue aspirin, atorvastatin, and nitroglycerin as needed.  He will call and confirm with [place] about isosorbide mononitrate as this is listed per our records, however his written note on his medication list reveals isosorbide dinitrate.  He will confirm which when he is taking.  No medication changes at this time.  Also updating echocardiogram as noted below.  Care and ED precautions discussed.  There is a very high threshold to repeat cardiac catheterization due to his CKD stage IV.  Informed Consent   Shared Decision Making/Informed Consent The risks [chest pain, shortness of breath, cardiac arrhythmias, dizziness, blood pressure fluctuations, myocardial infarction, stroke/transient ischemic attack, nausea, vomiting, allergic reaction, radiation exposure, metallic taste sensation and life-threatening complications (estimated to be 1 in 10,000)], benefits (risk  stratification, diagnosing coronary artery disease, treatment guidance) and alternatives of a nuclear stress test were discussed in detail with Mr. Gidney and he agrees to proceed.    2.  Chronic systolic CHF, NICM Stage C, NYHA class I symptoms.  EF in 2023 was found to be 35 to 40%.  He has been closely followed by heart failure clinic in the past.  Continue current medication regimen. Low sodium diet, fluid restriction <2L, and daily weights encouraged. Educated to contact our office for weight gain of 2 lbs overnight or 5 lbs in one week.  Will update echocardiogram at this time to evaluate his heart pumping function.  3.  A-fib, NSVT, prolonged QT interval Denies any tachycardia or palpitations.  He is sinus rhythm on exam today.  EKG today does reveal prolonged QT interval.  Will stop amiodarone at this time.  Will arrange follow-up with electrophysiology for further evaluation.  He was taking Eliquis in the past as noted by EP's note in June 2024.  Chart reviewed and in July 2024 patient was not eligible for approval for Eliquis assistance, he confirmed today that he is no longer taking Eliquis due to affordability issues.  Patient was called and educated regarding Coumadin as this would be best affordable OAC option for him.  Patient is agreeable and will arrange referral to our Coumadin clinic.  Addendum 01/16/2024: Routed note to Dr. Lalla Brothers for further review and stated okay for patient to take Amiodarone 200 mg daily. Will instruct patient to return to this medication and will arrange follow-up with EP APP as he is due for follow-up.   4.  Hypertension Blood pressure stable. Discussed to monitor BP at home at least 2 hours after medications and sitting for 5-10 minutes.  Continue current medication regimen. Heart healthy diet and regular cardiovascular exercise encouraged.   5.  Hyperlipidemia LDL from February 2024 was 76.  Continue atorvastatin.  Not addressed, at next office visit, would  recommend getting a fasting lipid panel for further evaluation. Heart healthy diet and regular cardiovascular exercise encouraged.   6.  Right carotid bruit Carotid duplex in 2016 was within normal limits.  Right carotid bruit noted on exam.  Continue current medication regimen.   Heart healthy diet and regular cardiovascular exercise encouraged. Will arrange carotid duplex for further evaluation.  7.  CKD stage IV Most recent serum creatinine was 2.48 with eGFR of 26 in January 2025.  He is being followed by nephrology.  Avoid nephrotoxic agents.  Continue follow-up PCP and nephrology.  Dispo: Follow-up with me/APP in 2 to 3 months or sooner if anything changes.  Signed, Sharlene Dory, NP

## 2024-01-16 ENCOUNTER — Telehealth: Payer: Self-pay | Admitting: Nurse Practitioner

## 2024-01-16 NOTE — Telephone Encounter (Signed)
-----   Message from Sharlene Dory sent at 01/16/2024  9:56 AM EST ----- Reviewed with his electrophysiologist Dr. Lalla Brothers. Please have him return to taking Amiodarone as he was before. This was Amiodarone 200 mg daily. He needs to follow-up with EP this year and Dr. Lalla Brothers said they can help arrange this.   Thanks!   Best, Sharlene Dory, NP

## 2024-01-19 MED ORDER — AMIODARONE HCL 200 MG PO TABS: 200.0000 mg | ORAL_TABLET | Freq: Every day | ORAL | Status: DC

## 2024-01-19 NOTE — Telephone Encounter (Signed)
 Patient informed and verbalized understanding of plan.

## 2024-01-21 ENCOUNTER — Encounter (HOSPITAL_COMMUNITY)
Admission: RE | Admit: 2024-01-21 | Discharge: 2024-01-21 | Disposition: A | Discharge status: Home / Self Care | Payer: Medicare HMO | Source: Ambulatory Visit | Attending: Nurse Practitioner | Admitting: Nurse Practitioner

## 2024-01-21 ENCOUNTER — Telehealth: Payer: Self-pay | Admitting: Physician Assistant

## 2024-01-21 ENCOUNTER — Other Ambulatory Visit: Payer: Self-pay | Admitting: Nurse Practitioner

## 2024-01-21 ENCOUNTER — Ambulatory Visit (HOSPITAL_COMMUNITY): Admission: RE | Admit: 2024-01-21 | Payer: Medicare HMO | Source: Ambulatory Visit

## 2024-01-21 ENCOUNTER — Encounter (HOSPITAL_COMMUNITY): Payer: Self-pay

## 2024-01-21 DIAGNOSIS — G8929 Other chronic pain: Secondary | ICD-10-CM | POA: Diagnosis not present

## 2024-01-21 DIAGNOSIS — R079 Chest pain, unspecified: Secondary | ICD-10-CM | POA: Insufficient documentation

## 2024-01-21 MED ORDER — TECHNETIUM TC 99M TETROFOSMIN IV KIT
30.0000 | PACK | Freq: Once | INTRAVENOUS | Status: AC | PRN
  Administered 2024-01-22: 31 via INTRAVENOUS

## 2024-01-21 MED ORDER — REGADENOSON 0.4 MG/5ML IV SOLN
INTRAVENOUS | Status: AC
  Filled 2024-01-21: qty 5

## 2024-01-21 MED ORDER — SODIUM CHLORIDE FLUSH 0.9 % IV SOLN
INTRAVENOUS | Status: AC
  Filled 2024-01-21: qty 10

## 2024-01-21 MED ORDER — TECHNETIUM TC 99M TETROFOSMIN IV KIT
10.0000 | PACK | Freq: Once | INTRAVENOUS | Status: AC | PRN
  Administered 2024-01-21: 11 via INTRAVENOUS

## 2024-01-21 NOTE — Telephone Encounter (Signed)
 Covering [facility] today. Hillary with stress test team reached out to inquire about reschedule since patient took Imdur today. He had held spironolactone but did not know to hold long acting nitrate. Per typical stress test instructions, patients should hold nitrates both long acting and short acting 24 hours before test so as not to mask ischemia on the resultant perfusion images. Therefore he should have the test rescheduled to a day where he did NOT take his Imdur the morning of the test. Will route to office staff to help reschedule. Hillary relayed this medication instruction to him for the rescheduled day. Thank you!

## 2024-01-21 NOTE — Telephone Encounter (Addendum)
 Update - per Dr. Ival Bible review, he feels OK to proceed on Imdur to determine ischemic burden while on antianginal therapy rather than hold for test. Will proceed with test today as scheduled per MD. I canceled duplicate order, can you cancel the rescheduled appt? Thanks!

## 2024-01-21 NOTE — Telephone Encounter (Signed)
 Triage - so sorry -I was informed by the stress test team that the patient's stress portion did end up getting rescheduled to tomorrow instead. Can you re-instate the order (under Sharlene Dory) and the appointment encounter for this? Appreciate all your help.

## 2024-01-21 NOTE — Telephone Encounter (Signed)
 While doing my hospital charges I noticed that this patient did not have his stress test done today. Only the imaging. I spoke with Isabelle Course said that he should had been scheduled for the stress test portion for tomorrow. That message was sent to Triage so, scheduling was not aware. I tried to call patient after speaking with Dayna he did not answer. I need to make the appointment for the stress test. Unsure of what time he was told to come back.

## 2024-01-21 NOTE — Telephone Encounter (Signed)
 Patient rescheduled stress test

## 2024-01-21 NOTE — Telephone Encounter (Signed)
 Rescheduled appointment canceled.

## 2024-01-21 NOTE — Addendum Note (Signed)
 Addended by: Laurann Montana on: 01/21/2024 09:34 AM   Modules accepted: Orders

## 2024-01-21 NOTE — Progress Notes (Signed)
 Laurann Montana, PA-C8 minutes ago (11:09 AM)     Triage - so sorry -I was informed by the stress test team that the patient's stress portion did end up getting rescheduled to tomorrow instead. Can you re-instate the order (under Sharlene Dory) and the appointment encounter for this? Appreciate all your help.    3rd order for lexiscan placed per request of Ronie Spies PA

## 2024-01-21 NOTE — Telephone Encounter (Signed)
 Was able to speak with his wife she said he was told to be here at 11am tomorrow. He is now scheduled.

## 2024-01-22 ENCOUNTER — Inpatient Hospital Stay (HOSPITAL_COMMUNITY)
Admission: RE | Admit: 2024-01-22 | Discharge: 2024-01-22 | Disposition: A | Discharge status: Home / Self Care | Payer: Medicare HMO | Source: Ambulatory Visit

## 2024-01-22 ENCOUNTER — Other Ambulatory Visit: Payer: Self-pay | Admitting: Medical

## 2024-01-22 ENCOUNTER — Inpatient Hospital Stay (HOSPITAL_COMMUNITY): Admission: RE | Admit: 2024-01-22 | Payer: Medicare HMO | Source: Ambulatory Visit

## 2024-01-22 DIAGNOSIS — R079 Chest pain, unspecified: Secondary | ICD-10-CM | POA: Diagnosis not present

## 2024-01-22 DIAGNOSIS — G8929 Other chronic pain: Secondary | ICD-10-CM | POA: Diagnosis not present

## 2024-01-22 LAB — NM MYOCAR MULTI W/SPECT W/WALL MOTION / EF
LV dias vol: 186 mL (ref 62–150)
LV sys vol: 119 mL
Nuc Stress EF: 36 %
Peak HR: 88 {beats}/min
Rest HR: 56 {beats}/min
ST Depression (mm): 0 mm
TID: 1.11

## 2024-01-22 MED ORDER — REGADENOSON 0.4 MG/5ML IV SOLN
INTRAVENOUS | Status: AC
  Administered 2024-01-22: 0.4 mg via INTRAVENOUS
  Filled 2024-01-22: qty 5

## 2024-01-22 MED ORDER — SODIUM CHLORIDE FLUSH 0.9 % IV SOLN
INTRAVENOUS | Status: AC
  Administered 2024-01-22: 10 mL via INTRAVENOUS
  Filled 2024-01-22: qty 10

## 2024-01-27 NOTE — Progress Notes (Unsigned)
  Electrophysiology Office Note:   Date:  01/28/2024  ID:  Echo, [place] [birth year], MRN [id number]  Primary Cardiologist: Dina Rich, MD Primary Heart Failure: Marca Ancona, MD Electrophysiologist: Lanier Prude, MD      History of Present Illness:   Ronald Mcdowell is a 78 y.o. male with h/o AF, chronic combined systolic / diastolic CHF seen today for routine electrophysiology followup.   Seen by Dr. Lalla Brothers in 07/2022 and was planned for AF ablation / [facility] but was noted in 04/2023 to not be on Eliquis and his procedures were canceled.   Since last being seen in our clinic the patient reports he continues to work at [employer]  He is doing well. He states he stopped taking eliquis a long time ago due to cost.  He did not fill out the assistance paperwork.  He "just went back on aspirin".  Denies known episodes of AF.   He denies chest pain, palpitations, dyspnea, PND, orthopnea, nausea, vomiting, dizziness, syncope, edema, weight gain, or early satiety.   Review of systems complete and found to be negative unless listed in HPI.   EP Information / Studies Reviewed:    EKG is ordered today. Personal review as below.  EKG Interpretation Date/Time:  Wednesday January 28 2024 10:46:39 EST Ventricular Rate:  71 PR Interval:  176 QRS Duration:  104 QT Interval:  444 QTC Calculation: 482 R Axis:   -19  Text Interpretation: Normal sinus rhythm Confirmed by Canary Brim ([id number]) on 01/28/2024 10:51:45 AM   Studies:  ECHO 07/2022 > LVEF 35-40%, G1DD Stress Test 12/06/22 > old infarction, LVEF 34% NM Stress 01/22/24 > old infarction, no ST deviation, ECG negative for ischemia, nuclear EF 36%   Arrhythmia / AAD AF Amiodarone     Risk Assessment/Calculations:    CHA2DS2-VASc Score = 7   This indicates a 11.2% annual risk of stroke. The patient's score is based upon: CHF History: 1 HTN History: 1 Diabetes History: 0 Stroke History: 2 Vascular Disease History: 1 Age  Score: 2 Gender Score: 0             Physical Exam:   VS:  BP 126/64   Pulse 71   Ht 5\' 5"  (1.651 m)   Wt 139 lb 12.8 oz (63.4 kg)   SpO2 98%   BMI 23.26 kg/m    Wt Readings from Last 3 Encounters:  01/28/24 139 lb 12.8 oz (63.4 kg)  01/15/24 141 lb (64 kg)  05/02/23 129 lb 3.2 oz (58.6 kg)     GEN: Well nourished, well developed in no acute distress NECK: No JVD; No carotid bruits CARDIAC: Regular rate and rhythm, no murmurs, rubs, gallops RESPIRATORY:  Clear to auscultation without rales, wheezing or rhonchi  ABDOMEN: Soft, non-tender, non-distended EXTREMITIES:  No edema; No deformity   ASSESSMENT AND PLAN:    Persistent Atrial Fibrillation  High Risk Drug Monitoring: Amiodarone  CHA2DS2-VASc 7 -not taking OAC, previously given assistance paperwork but did not fill out.  -discussed risk of stroke annually with AF, reviewed coumadin and he is not interested in taking due to monitoring / dietary restrictions  -continue amiodarone 200mg  daily  -update amio labs > CMP, TSH / T4   HFrEF  LVEF 35-40%, NYHA II  -GDMT per HF Clinic  -euvolemic on exam    Follow up with Dr. Lalla Brothers in 12 months  Signed, Canary Brim, NP-C, AGACNP-BC [facility] - Electrophysiology  01/28/2024, 11:58 AM

## 2024-01-28 ENCOUNTER — Encounter: Payer: Self-pay | Admitting: Pulmonary Disease

## 2024-01-28 ENCOUNTER — Ambulatory Visit: Payer: Medicare HMO | Attending: Pulmonary Disease | Admitting: Pulmonary Disease

## 2024-01-28 ENCOUNTER — Encounter: Payer: Self-pay | Admitting: *Deleted

## 2024-01-28 VITALS — BP 126/64 | HR 71 | Ht 65.0 in | Wt 139.8 lb

## 2024-01-28 DIAGNOSIS — I10 Essential (primary) hypertension: Secondary | ICD-10-CM

## 2024-01-28 DIAGNOSIS — I4891 Unspecified atrial fibrillation: Secondary | ICD-10-CM

## 2024-01-28 DIAGNOSIS — I5022 Chronic systolic (congestive) heart failure: Secondary | ICD-10-CM | POA: Diagnosis not present

## 2024-01-28 NOTE — Patient Instructions (Signed)
 Medication Instructions:  Your physician recommends that you continue on your current medications as directed. Please refer to the Current Medication list given to you today.  *If you need a refill on your cardiac medications before your next appointment, please call your pharmacy*  Lab Work: CMET, TSH, FreeT4-TODAY If you have labs (blood work) drawn today and your tests are completely normal, you will receive your results only by: MyChart Message (if you have MyChart) OR A paper copy in the mail If you have any lab test that is abnormal or we need to change your treatment, we will call you to review the results.  Follow-Up: At [facility], you and your health needs are our priority.  As part of our continuing mission to provide you with exceptional heart care, we have created designated Provider Care Teams.  These Care Teams include your primary Cardiologist (physician) and Advanced Practice Providers (APPs -  Physician Assistants and Nurse Practitioners) who all work together to provide you with the care you need, when you need it.  We recommend signing up for the patient portal called "MyChart".  Sign up information is provided on this After Visit Summary.  MyChart is used to connect with patients for Virtual Visits (Telemedicine).  Patients are able to view lab/test results, encounter notes, upcoming appointments, etc.  Non-urgent messages can be sent to your provider as well.   To learn more about what you can do with MyChart, go to [email or web address].    Your next appointment:   1 year(s)  Provider:   You may see Lanier Prude, MD or one of the following Advanced Practice Providers on your designated Care Team:   Francis Dowse, [place] [address]" [place], [place] Sherie Don, NP Canary Brim, NP

## 2024-01-29 LAB — COMPREHENSIVE METABOLIC PANEL
ALT: 20 IU/L (ref 0–44)
AST: 21 IU/L (ref 0–40)
Albumin: 4.2 g/dL (ref 3.8–4.8)
Alkaline Phosphatase: 62 IU/L (ref 44–121)
BUN/Creatinine Ratio: 17 (ref 10–24)
BUN: 33 mg/dL — ABNORMAL HIGH (ref 8–27)
Bilirubin Total: 0.4 mg/dL (ref 0.0–1.2)
CO2: 22 mmol/L (ref 20–29)
Calcium: 8.9 mg/dL (ref 8.6–10.2)
Chloride: 108 mmol/L — ABNORMAL HIGH (ref 96–106)
Creatinine, Ser: 1.94 mg/dL — ABNORMAL HIGH (ref 0.76–1.27)
Globulin, Total: 2.9 g/dL (ref 1.5–4.5)
Glucose: 79 mg/dL (ref 70–99)
Potassium: 4.1 mmol/L (ref 3.5–5.2)
Sodium: 143 mmol/L (ref 134–144)
Total Protein: 7.1 g/dL (ref 6.0–8.5)
eGFR: 35 mL/min/{1.73_m2} — ABNORMAL LOW (ref >59)

## 2024-01-29 LAB — TSH: TSH: 9.53 u[IU]/mL — ABNORMAL HIGH (ref 0.450–4.500)

## 2024-01-29 LAB — T4, FREE: Free T4: 1.29 ng/dL (ref 0.82–1.77)

## 2024-02-02 ENCOUNTER — Ambulatory Visit: Payer: Medicare HMO | Attending: Nurse Practitioner

## 2024-02-02 ENCOUNTER — Ambulatory Visit (INDEPENDENT_AMBULATORY_CARE_PROVIDER_SITE_OTHER): Payer: Medicare HMO

## 2024-02-02 DIAGNOSIS — R0989 Other specified symptoms and signs involving the circulatory and respiratory systems: Secondary | ICD-10-CM | POA: Diagnosis not present

## 2024-02-02 DIAGNOSIS — I5022 Chronic systolic (congestive) heart failure: Secondary | ICD-10-CM | POA: Diagnosis not present

## 2024-02-03 LAB — ECHOCARDIOGRAM COMPLETE
AR max vel: 1.55 cm2
AV Area VTI: 1.67 cm2
AV Area mean vel: 1.75 cm2
AV Mean grad: 8 mmHg
AV Peak grad: 17.1 mmHg
Ao pk vel: 2.07 m/s
Area-P 1/2: 2.53 cm2
Calc EF: 45.8 %
MV VTI: 1.43 cm2
S' Lateral: 4.4 cm
Single Plane A2C EF: 49.2 %
Single Plane A4C EF: 45.7 %

## 2024-02-04 ENCOUNTER — Other Ambulatory Visit: Payer: Self-pay

## 2024-02-04 DIAGNOSIS — R899 Unspecified abnormal finding in specimens from other organs, systems and tissues: Secondary | ICD-10-CM

## 2024-02-04 DIAGNOSIS — R7989 Other specified abnormal findings of blood chemistry: Secondary | ICD-10-CM

## 2024-02-15 ENCOUNTER — Other Ambulatory Visit: Payer: Self-pay | Admitting: Physician Assistant

## 2024-02-27 ENCOUNTER — Other Ambulatory Visit: Payer: Self-pay | Admitting: Physician Assistant

## 2024-03-22 ENCOUNTER — Encounter: Payer: Self-pay | Admitting: Nurse Practitioner

## 2024-03-22 ENCOUNTER — Ambulatory Visit: Payer: Medicare HMO | Attending: Nurse Practitioner | Admitting: Nurse Practitioner

## 2024-03-22 VITALS — BP 161/72 | HR 62 | Ht 65.0 in | Wt 142.6 lb

## 2024-03-22 DIAGNOSIS — I5022 Chronic systolic (congestive) heart failure: Secondary | ICD-10-CM

## 2024-03-22 DIAGNOSIS — R0789 Other chest pain: Secondary | ICD-10-CM

## 2024-03-22 DIAGNOSIS — I10 Essential (primary) hypertension: Secondary | ICD-10-CM | POA: Diagnosis not present

## 2024-03-22 DIAGNOSIS — I4891 Unspecified atrial fibrillation: Secondary | ICD-10-CM | POA: Diagnosis not present

## 2024-03-22 DIAGNOSIS — I4729 Other ventricular tachycardia: Secondary | ICD-10-CM

## 2024-03-22 DIAGNOSIS — N184 Chronic kidney disease, stage 4 (severe): Secondary | ICD-10-CM

## 2024-03-22 DIAGNOSIS — I350 Nonrheumatic aortic (valve) stenosis: Secondary | ICD-10-CM | POA: Diagnosis not present

## 2024-03-22 DIAGNOSIS — I2510 Atherosclerotic heart disease of native coronary artery without angina pectoris: Secondary | ICD-10-CM | POA: Diagnosis not present

## 2024-03-22 DIAGNOSIS — I5020 Unspecified systolic (congestive) heart failure: Secondary | ICD-10-CM

## 2024-03-22 DIAGNOSIS — E785 Hyperlipidemia, unspecified: Secondary | ICD-10-CM

## 2024-03-22 DIAGNOSIS — E782 Mixed hyperlipidemia: Secondary | ICD-10-CM

## 2024-03-22 DIAGNOSIS — I428 Other cardiomyopathies: Secondary | ICD-10-CM | POA: Diagnosis not present

## 2024-03-22 DIAGNOSIS — I6523 Occlusion and stenosis of bilateral carotid arteries: Secondary | ICD-10-CM

## 2024-03-22 MED ORDER — EZETIMIBE 10 MG PO TABS
10.0000 mg | ORAL_TABLET | Freq: Every day | ORAL | 3 refills | Status: DC
Start: 2024-03-22 — End: 2024-06-22

## 2024-03-22 NOTE — Patient Instructions (Addendum)
 Medication Instructions:  Your physician has recommended you make the following change in your medication:  Please Start Zetia 10 Mg daily   Labwork: In 2 months at [facility]   Testing/Procedures: None   Follow-Up: Your physician recommends that you schedule a follow-up appointment in:  2 week nurse visit for a Blood Pressure check 3-4 months   Any Other Special Instructions Will Be Listed Below (If Applicable).    (Patient Name)-_____________________________  (MRN)-_________________________     (Physician)-_________________________________   (DATE) -_________ (Blood Pressure)-_________  (Heart Rate)-_________    (DATE) -_________ (Blood Pressure)-_________  (Heart Rate)-_________    (DATE) -_________ (Blood Pressure)-_________  (Heart Rate)-_________   (DATE) -_________ (Blood Pressure)-_________  (Heart Rate)-_________    (DATE) -_________ (Blood Pressure)-_________  (Heart Rate)-_________    (DATE) -_________ (Blood Pressure)-_________  (Heart Rate)-_________   (DATE) -_________ (Blood Pressure)-_________  (Heart Rate)-_________    (DATE) -_________ (Blood Pressure)-_________  (Heart Rate)-_________    (DATE) -_________ (Blood Pressure)-_________  (Heart Rate)-_________    (DATE) -_________ (Blood Pressure)-_________  (Heart Rate)-_________    (DATE) -_________ (Blood Pressure)-_________  (Heart Rate)-_________    (DATE) -_________ (Blood Pressure)-_________  (Heart Rate)-_________    (DATE) -_________ (Blood Pressure)-_________  (Heart Rate)-_________    (DATE) -_________ (Blood Pressure)-_________  (Heart Rate)-_________     If you need a refill on your cardiac medications before your next appointment, please call your pharmacy.

## 2024-03-22 NOTE — Progress Notes (Addendum)
 Cardiology Office Note:  .   Date:  03/22/2024 ID:  Cleola Dach, DOB 1946-01-29, MRN [id number] PCP: Veda Gerald, MD  [facility] Providers Cardiologist:  Armida Lander, MD Electrophysiologist:  Boyce Byes, MD  Advanced Heart Failure:  Peder Bourdon, MD    History of Present Illness: .   Ronald Mcdowell is a very pleasant 78 y.o. male with a past medical history of CAD, HFrEF, NICM, A-fib, NSVT, hypertension, hyperlipidemia, CKD stage IV, and Graves' disease, s/p iodine thyroid  ablation, and recurrent UTIs, who presents today for follow-up appointment.  Followed by electrophysiology and heart failure clinic.  Previous cardiovascular history of stenting to left circumflex in 2014, as well as previous PCI in 2000, in February 2023 he underwent cardiac catheterization-see report below.  Last seen by Dr. Armida Lander on December 09, 2022.  He was overall doing well at the time.  It was noted he did have some chronic dizziness, denied any specific positional symptoms.  Orthostatics were negative in office that day.  In the interim, he has had several ED visits last year.  More recently was seen at [facility] ED in October 2024 for atypical chest pain.  He noted lightheadedness and intermittent paresthesias of right arm, denied any weakness.  Noted to have chest pain earlier that day, pain had resolved upon presentation to ED.  Workup was reassuring.  CT of the head was negative for anything acute, did show mild chronic small vessel ischemic changes within the cerebral white matter.  Was given Rx for meclizine .  More recently, seen at [facility] ED on December 04, 2023 for presyncopal episode.  Patient noted significant dizziness with feelings of near syncope that morning and bradycardia noted in ED.  Was noted to be bradycardic.  Heart rate in 40s when he arrived.  14-day ZIO monitor was arranged - see report below.  01/15/2024 - Today he presents for scheduled 32-month  follow-up appointment. He notices episodes of chest pain where he describes it as a tightness along left upper side of his chest, causes him to leave work early to go to the emergency department.  This has been chronic and stable for a while.  He says his past workup has been reassuring.  He also admits to left arm numbness/stinging sensation, particularly noticed and made worse when laying on his left side. Denies any shortness of breath, palpitations, syncope, presyncope, dizziness, orthopnea, PND, swelling or significant weight changes, acute bleeding, or claudication.  Seen by EP NP on 01/28/2024, was doing well at the time.   03/22/2024 - Presents today for follow-up.  He tells me he is doing better since I last saw him in the office.  Continues to note intermittent chest pain, has improved overall per his report.  Says this happens usually early in the morning.  Says he will "hit his chest" (not hard per his report), and it goes away.  Says this is brief in duration. Says his arms/shoulders will feel sore at times. Denies any active chest pain. Compliant with his medications and tolerating well. Denies any shortness of breath, palpitations, syncope, presyncope, dizziness, orthopnea, PND, swelling or significant weight changes, acute bleeding, or claudication. Does have a BP cuff at home but does not check his BP.   ROS: Negative.  See HPI. SH: Works at [employer] Reviewed: Aaron Aas       EKG: EKG is not ordered today.  Carotid duplex 01/2024:  Summary:  Right Carotid: Velocities in  the right ICA are consistent with a 1-39%  stenosis.                Non-hemodynamically significant plaque <50% noted in the  CCA. The                 ECA appears <50% stenosed.   Left Carotid: Velocities in the left ICA are consistent with a 1-39%  stenosis.               Non-hemodynamically significant plaque <50% noted in the  CCA. The                ECA appears <50% stenosed.   Vertebrals:  Bilateral  vertebral arteries demonstrate antegrade flow.  Subclavians: Normal flow hemodynamics were seen in bilateral subclavian               arteries.   *See table(s) above for measurements and observations.    Echo 01/2024:    1. Left ventricular ejection fraction, by estimation, is 45 to 50%. Left  ventricular ejection fraction by 3D volume is 48 %. The left ventricle has  mildly decreased function. The left ventricle demonstrates regional wall  motion abnormalities (see  scoring diagram/findings for description). Left ventricular diastolic  parameters are consistent with Grade I diastolic dysfunction (impaired  relaxation). Elevated left ventricular end-diastolic pressure. The average  left ventricular global longitudinal  strain is -17.5 %. The global longitudinal strain is normal.   2. Right ventricular systolic function is normal. The right ventricular  size is normal. Tricuspid regurgitation signal is inadequate for assessing  PA pressure.   3. Left atrial size was mild to moderately dilated.   4. The mitral valve is normal in structure. Trivial mitral valve  regurgitation. No evidence of mitral stenosis.   5. The aortic valve is tricuspid. There is mild calcification of the  aortic valve. Aortic valve regurgitation is not visualized. Mild aortic  valve stenosis. Aortic valve area, by VTI measures 1.67 cm. Aortic valve  mean gradient measures 8.0 mmHg.  Aortic valve Vmax measures 2.07 m/s.   6. The inferior vena cava is normal in size with greater than 50%  respiratory variability, suggesting right atrial pressure of 3 mmHg.   Comparison(s): A prior study was performed on 07/30/2022. Changes from prior  study are noted. LVEF improved from 35-40% to 45-50% with RWMA. Mild AS is  new.   Lexiscan  12/2023:      Findings are consistent with infarction. The study is intermediate risk.   No ST deviation was noted. The ECG was negative for ischemia.   LV perfusion is abnormal.  Large,  severe intensity, apical to basal inferolateral defect that is fixed and consistent with infarct scar.  No significant ischemia.   Left ventricular function is abnormal. Nuclear stress EF: 36%. End diastolic cavity size is moderately enlarged. End systolic cavity size is moderately enlarged.   Overall intermediate risk study with evidence of inferolateral infarct scar but no substantial ischemia.  LV dilated with LVEF 36%.  Cardiac monitor 12/23/2023: Conclusions:  - Ambulatory ECG monitoring was performed from 12/04/23 to 12/17/23, with  ~13 days of analyzable data.  -  The predominant rhythm was sinus rhythm, with sinus rate ranging from 46 to 113 and averaging 62 bpm.  -  First Degree AV Block was present. Intermittent Bundle Branch Block was present.  -  Rare PACs were recorded with no recorded episodes of SVT.  -  Rare PVCs were recorded.  -  3 episodes of asymptomatic wide complex tachycardia occurred, the run with the fastest interval lasting 7 beats with a max rate of 162 bpm and the longest lasting 6 beats with an avg rate of 117 bpm.  -  No pauses >3 seconds or high-grade AV block detected.  -  No atrial fibrillation detected.  -  Patient-initiated recordings/events correlated with sinus rhythm.   Lexiscan  11/2022:   Findings are consistent with infarction. The study is intermediate risk.   No ST deviation was noted.   LV perfusion is abnormal. There is no evidence of ischemia. There is evidence of infarction. Defect 1: There is a medium defect with moderate reduction in uptake present in the mid to basal inferolateral location(s) that is fixed. There is abnormal wall motion in the defect area. Consistent with infarction.   Left ventricular function is abnormal. Global function is moderately reduced. End diastolic cavity size is moderately enlarged.   Prior study available for comparison from 06/10/2013. There are changes compared to prior study. The left ventricular ejection fraction has  decreased.   Intermediate risk nuclear perfusion study.   There is an old inferolateral infarction and left ventricular systolic function is moderately depressed.  There is no evidence of reversible ischemia.  Perfusion pattern is similar to the previous study, but the left ventricular systolic function is lower.  Echocardiogram 07/2022:  1. Left ventricular ejection fraction, by estimation, is 35 to 40%. The  left ventricle has moderately decreased function. The left ventricle  demonstrates regional wall motion abnormalities with basal inferior  akinesis, basal inferolateral akinesis, and  basal to mid inferolateral akinesis. There is mild concentric left  ventricular hypertrophy. Left ventricular diastolic parameters are  consistent with Grade I diastolic dysfunction (impaired relaxation).   2. Right ventricular systolic function is normal. The right ventricular  size is normal. Tricuspid regurgitation signal is inadequate for assessing  PA pressure.   3. Left atrial size was mildly dilated.   4. The mitral valve is normal in structure. No evidence of mitral valve  regurgitation. No evidence of mitral stenosis.   5. The aortic valve is tricuspid. There is mild calcification of the  aortic valve. Aortic valve regurgitation is not visualized. No aortic  stenosis is present.   6. The inferior vena cava is normal in size with greater than 50%  respiratory variability, suggesting right atrial pressure of 3 mmHg.  Cardiac MRI 01/2022: IMPRESSION: 1.  Severely dilated LV with diffuse hypokinesis, EF 24%.   2.  Normal RV size with mildly decreased systolic function, EF 45%.   3. Delayed enhancement images show coronary-pattern LGE in a circumflex coronary artery distribution, suspect prior MI involving the LCx.   I do not think that the amount of scarring from prior MI explains the extent of his cardiomyopathy. No evidence, however, for myocarditis or infiltrative disease by LGE.    Right and left heart cath 12/2021: CONCLUSIONS: Luminal irregularities in the left main Luminal irregularities with up to 40% eccentric mid LAD Widely patent mid circumflex stent.  Diffuse disease in branches in the still second obtuse marginal. Dominant right coronary, mid eccentric 70% stenosis, distal 65% stenosis. Normal LVEDP Normal capillary wedge pressure Mixed venous O2 saturation 72%   RECOMMENDATIONS: Systolic dysfunction is out of proportion to the degree of coronary disease. Guideline directed therapy for systolic heart failure. Resume apixaban  in 8 hours.       Physical Exam:   VS:  BP (!) 161/72 (BP Location: Left Arm, Patient  Position: Sitting, Cuff Size: Normal)   Pulse 62   Ht 5\' 5"  (1.651 m)   Wt 142 lb 9.6 oz (64.7 kg)   SpO2 97%   BMI 23.73 kg/m    Wt Readings from Last 3 Encounters:  03/22/24 142 lb 9.6 oz (64.7 kg)  01/28/24 139 lb 12.8 oz (63.4 kg)  01/15/24 141 lb (64 kg)   Repeat BP during exam: 161/72  GEN: Well nourished, well developed in no acute distress NECK: No JVD; right carotid bruit noted on exam, no left carotid bruit CARDIAC: S1/S2, RRR, no murmurs, rubs, gallops RESPIRATORY:  Clear to auscultation without rales, wheezing or rhonchi  ABDOMEN: Soft, non-tender, non-distended EXTREMITIES:  No edema; No deformity   ASSESSMENT AND PLAN: .    1.  Atypical chest pain, CAD Admits to some improved atypical chest pain.  Denies any specific triggers.  Does not sound ischemic in nature, sounds more likely related to MSK soreness from being active, especially with his job.  See most recent Lexiscan  noted above, study found to be intermediate risk based on reduced EF.  No significant ischemia noted on exam.  Past evidence of inferolateral infarct scar. Continue aspirin , atorvastatin , Imdur , and nitroglycerin  as needed.  Starting Zetia  as mentioned below.  Will obtain labs including FLP/LFT after initiating.  Improved EF on most recent echocardiogram,  still mildly reduced EF.  Heart healthy diet and regular cardiovascular exercise as tolerated encouraged.  Care and ED precautions discussed.  Agree with Dr. Amanda Jungling that there is a very high threshold to repeat cardiac catheterization due to his CKD stage IV.    2.  Chronic systolic CHF, HFmrEF, NICM Stage C, NYHA class I symptoms.  EF in 2023 was found to be 35 to 40%.  Most recent echocardiogram in March 2025 showed EF 45 to 50%.  He has been closely followed by heart failure clinic in the past.  Euvolemic and well compensated on exam. Continue current medication regimen. Low sodium diet, fluid restriction <2L, and daily weights encouraged. Educated to contact our office for weight gain of 2 lbs overnight or 5 lbs in one week.    3. Mild aortic valve stenosis Most recent echocardiogram revealed no evidence of mild aortic valve stenosis with mean gradient measuring 8.0 mmHg.  He is asymptomatic with this.  Will continue to monitor and recommend updating echocardiogram in 1 year or sooner if clinically indicated.  No medication changes at this time besides what is noted above.  Care and ED precautions discussed.  4.  A-fib, NSVT  Denies any tachycardia or palpitations. He was taking Eliquis  in the past as noted by EP APP's recent note.  Chart reviewed and patient was not eligible for approval for Eliquis  assistance, he confirmed today that he is no longer taking Eliquis  due to affordability issues.  Coumadin was reviewed with him as well as stroke risk, and patient was not interested in taking due to monitoring and dietary restrictions.  Previously consulted Dr. Marven Slimmer for further review of past EKG from previous office visit and stated okay for patient to continue amiodarone  200 mg daily.  No medication changes at this time besides what is mentioned above. Continue follow-up with EP.  5.  Hypertension Blood pressure elevated today, usually well controlled. Discussed to monitor BP at home at least 2  hours after medications and sitting for 5-10 minutes.  Given BP log and discussed to monitor this and bring log to next visit.  He will return in 2  weeks for BP check.  GDMT limited due to his CKD.  Continue hydralazine , Imdur , and spironolactone . Heart healthy diet and regular cardiovascular exercise encouraged.  If no improvement in BP by next BP check, plan to increase Imdur  dosage.  6.  Hyperlipidemia LDL 80 10/2023. LDL goal < 70. Continue atorvastatin .  Will initiate Zetia  10 mg daily and repeat FLP/LFT in 2 to 3 months per protocol.  Heart healthy diet and regular cardiovascular exercise encouraged.   7.  Carotid artery stenosis Most recent carotid Doppler revealed bilateral ICA stenosis at 1 to 39%.  Adding Zetia  to medication regimen as noted above.  Continue rest of medication regimen.  Heart healthy diet and regular cardiovascular exercise encouraged.   8.  CKD stage IV Most recent serum creatinine was 1.94 with eGFR of 35. He is being followed by nephrology.  Avoid nephrotoxic agents.  Continue follow-up PCP and nephrology.  Dispo: Follow-up with me/APP in 3-4 months or sooner if anything changes.  Signed, Lasalle Pointer, NP

## 2024-04-05 ENCOUNTER — Ambulatory Visit: Attending: Internal Medicine

## 2024-04-05 DIAGNOSIS — N184 Chronic kidney disease, stage 4 (severe): Secondary | ICD-10-CM | POA: Diagnosis not present

## 2024-04-05 DIAGNOSIS — E782 Mixed hyperlipidemia: Secondary | ICD-10-CM | POA: Diagnosis not present

## 2024-04-05 DIAGNOSIS — I5020 Unspecified systolic (congestive) heart failure: Secondary | ICD-10-CM | POA: Diagnosis not present

## 2024-04-06 LAB — HEPATIC FUNCTION PANEL
ALT: 22 IU/L (ref 0–44)
AST: 23 IU/L (ref 0–40)
Albumin: 4.4 g/dL (ref 3.8–4.8)
Alkaline Phosphatase: 58 IU/L (ref 44–121)
Bilirubin Total: 0.6 mg/dL (ref 0.0–1.2)
Bilirubin, Direct: 0.18 mg/dL (ref 0.00–0.40)
Total Protein: 7.6 g/dL (ref 6.0–8.5)

## 2024-04-06 LAB — LIPID PANEL
Chol/HDL Ratio: 2.7 ratio (ref 0.0–5.0)
Cholesterol, Total: 106 mg/dL (ref 100–199)
HDL: 39 mg/dL — ABNORMAL LOW (ref >39)
LDL Chol Calc (NIH): 56 mg/dL (ref 0–99)
Triglycerides: 46 mg/dL (ref 0–149)
VLDL Cholesterol Cal: 11 mg/dL (ref 5–40)

## 2024-04-19 ENCOUNTER — Ambulatory Visit: Payer: Self-pay | Admitting: Nurse Practitioner

## 2024-05-11 DIAGNOSIS — Z7189 Other specified counseling: Secondary | ICD-10-CM | POA: Diagnosis not present

## 2024-05-11 DIAGNOSIS — Z299 Encounter for prophylactic measures, unspecified: Secondary | ICD-10-CM | POA: Diagnosis not present

## 2024-05-11 DIAGNOSIS — I10 Essential (primary) hypertension: Secondary | ICD-10-CM | POA: Diagnosis not present

## 2024-05-11 DIAGNOSIS — E039 Hypothyroidism, unspecified: Secondary | ICD-10-CM | POA: Diagnosis not present

## 2024-05-11 DIAGNOSIS — Z0000 Encounter for general adult medical examination without abnormal findings: Secondary | ICD-10-CM | POA: Diagnosis not present

## 2024-05-11 DIAGNOSIS — Z1339 Encounter for screening examination for other mental health and behavioral disorders: Secondary | ICD-10-CM | POA: Diagnosis not present

## 2024-05-11 DIAGNOSIS — Z1331 Encounter for screening for depression: Secondary | ICD-10-CM | POA: Diagnosis not present

## 2024-05-17 ENCOUNTER — Other Ambulatory Visit (HOSPITAL_COMMUNITY): Payer: Self-pay | Admitting: Cardiology

## 2024-05-24 NOTE — Progress Notes (Deleted)
 Office Visit Note  Patient: Ronald Mcdowell             Date of Birth: 06-Mar-1946           MRN: [id number]             PCP: Orpha Yancey LABOR, MD Referring: Lateef, Munsoor, MD Visit Date: 05/25/2024 Occupation: @GUAROCC @  Subjective:  No chief complaint on file.   History of Present Illness: Ronald Mcdowell is a 78 y.o. male ***     Activities of Daily Living:  Patient reports morning stiffness for *** {minute/hour:19697}.   Patient {ACTIONS;DENIES/REPORTS:21021675::Denies} nocturnal pain.  Difficulty dressing/grooming: {ACTIONS;DENIES/REPORTS:21021675::Denies} Difficulty climbing stairs: {ACTIONS;DENIES/REPORTS:21021675::Denies} Difficulty getting out of chair: {ACTIONS;DENIES/REPORTS:21021675::Denies} Difficulty using hands for taps, buttons, cutlery, and/or writing: {ACTIONS;DENIES/REPORTS:21021675::Denies}  No Rheumatology ROS completed.   PMFS History:  Patient Active Problem List   Diagnosis Date Noted   Anticoagulant long-term use 01/15/2024   HFrEF (heart failure with reduced ejection fraction) (HCC) 12/03/2022   Atrial fibrillation with RVR (HCC) 02/16/2022   Chronic combined systolic and diastolic heart failure (HCC)    AKI (acute kidney injury) (HCC)    Hypomagnesemia    NSVT (nonsustained ventricular tachycardia) (HCC) 01/14/2022   Unstable angina (HCC)    Ischemic dilated cardiomyopathy (HCC)    Chest pain 01/13/2022   UTI (urinary tract infection) 01/13/2022   Ischemic heart disease due to coronary artery obstruction (HCC) 01/09/2022   Ventricular ectopy 01/09/2022   Hyperthyroidism 09/27/2020   Dysphagia 04/19/2020   Odynophagia 04/19/2020   Eosinophilic esophagitis 04/19/2020   Diarrhea 04/19/2020   Dehydration, moderate 04/12/2020   Dysphasia 04/12/2020   Nausea, vomiting and diarrhea 04/12/2020   Chronic kidney disease (CKD), stage III (moderate) (HCC) 01/14/2020   DDD (degenerative disc disease), cervical 01/14/2020   Hematuria  01/14/2020   Hiatal hernia 01/14/2020   Tobacco use 01/14/2020   Persistent atrial fibrillation (HCC) 12/01/2019   Hypokalemia 12/01/2019   Arm numbness 12/01/2019   Nicotine  dependence 12/01/2019   Permanent atrial fibrillation (HCC) 12/01/2019   Vitamin D  deficiency disease 08/24/2019   Vitamin D  deficiency 08/24/2019   Gastro-esophageal reflux disease with esophagitis 03/26/2018   Aortic atherosclerosis (HCC) 01/20/2018   Hardening of the aorta (main artery of the heart) (HCC) 01/20/2018   GERD (gastroesophageal reflux disease) 12/17/2017   Gastro-esophageal reflux disease without esophagitis 12/17/2017   HLD (hyperlipidemia) 11/04/2017   Hyperlipidemia 11/04/2017   Loss of weight 11/30/2015   Abnormal LFTs 11/30/2015   Liver cyst 11/30/2015   Other specified diseases of liver 11/30/2015   TIA (transient ischemic attack) 04/10/2015   Transient cerebral ischemia 04/10/2015   Essential (primary) hypertension 08/24/2013   Current smoker    Coronary artery disease involving native coronary artery of native heart with angina pectoris (HCC)    Ischemic cardiomyopathy    DYSPHAGIA ORAL PHASE 04/13/2010   Old myocardial infarction 01/31/2010   CORONARY ATHEROSCLEROSIS NATIVE CORONARY ARTERY 01/31/2010   CAD (coronary artery disease), native coronary artery 01/31/2010    Past Medical History:  Diagnosis Date   Anemia    Atrial fibrillation (HCC)    CHF (congestive heart failure) (HCC)    Chronic kidney disease, unspecified 01/14/2020   Coronary artery disease    a. h/o MI in 2000 w/ prior LCX stenting;  b. 7.2014 Abnl Cardiolite , EF 41%, mod-large inferolat scar;  c. 07/2013 Cath/PCI: LM nl, LAD 10-20, D1 40-50p, LCX 95-99 @ distal stent margin (2.5x20 Promus Premier DES), RCA dom, 40p, 106m, 70d, EF 35-40%.  Elevated cholesterol    GERD (gastroesophageal reflux disease)    Graves disease 04/2020   s/p I-131 thyroid  ablation    Hemorrhoids    Hypertension    Hyperthyroidism  09/27/2020   Ischemic cardiomyopathy    a. 07/2013 EF 35-40% by LV gram.; EF 50% on ECHO in November 2020   Myocardial infarction [facility])    Reflux esophagitis    Spondylosis of cervical joint    C3-4, C6-7   Tobacco user    Vitamin D  deficiency disease 08/24/2019    Family History  Problem Relation Age of Onset   Early death Mother        childbirth   Heart attack Father    Cancer Father    Heart disease Brother        CABG   Heart disease Brother        slight heart attack   Anesthesia problems Neg Hx    Hypotension Neg Hx    Malignant hyperthermia Neg Hx    Pseudochol deficiency Neg Hx    Colon cancer Neg Hx    Liver disease Neg Hx    Gastric cancer Neg Hx    Esophageal cancer Neg Hx    Past Surgical History:  Procedure Laterality Date   BIOPSY  01/09/2018   Procedure: BIOPSY;  Surgeon: Shaaron Lamar HERO, MD;  Location: [facility];  Service: Endoscopy;;  esophageal biopsies   CARDIAC CATHETERIZATION     CARDIOVERSION N/A 02/18/2022   Procedure: CARDIOVERSION;  Surgeon: Rolan Ezra RAMAN, MD;  Location[facility] ENDOSCOPY;  Service: Cardiovascular;  Laterality: N/A;   COLONOSCOPY     COLONOSCOPY N/A 01/25/2016   RMR: normal ileocolonoscopy ( anal canal hemorrhoids)   CORONARY ANGIOPLASTY WITH STENT PLACEMENT  08/23/2013   mid circumflex  DES    by Dr Swaziland   CORONARY STENT PLACEMENT  2000   ESOPHAGOGASTRODUODENOSCOPY N/A 01/25/2016   RMR: Reflux esophagitits. Cervical web with passage of the scope. Status post esophageal biosy. Hiatal hernia.    ESOPHAGOGASTRODUODENOSCOPY N/A 01/09/2018   Rourk: cervical/proximal esophageal web s/p dilation, markedly abnormal esophagus c/w biopsy proven eosinophilic esophagitis. small hiatal hernia.   ESOPHAGOGASTRODUODENOSCOPY N/A 05/31/2020   Procedure: ESOPHAGOGASTRODUODENOSCOPY (EGD);  Surgeon: Shaaron Lamar HERO, MD;  Location: [facility];  Service: Endoscopy;  Laterality: N/A;  8:30am   LEFT HEART CATHETERIZATION WITH CORONARY ANGIOGRAM N/A  08/23/2013   Procedure: LEFT HEART CATHETERIZATION WITH CORONARY ANGIOGRAM;  Surgeon: Peter M Swaziland, MD;  Location: [facility] CATH LAB;  Service: Cardiovascular;  Laterality: N/A;   MALONEY DILATION N/A 01/09/2018   Procedure: AGAPITO DILATION;  Surgeon: Shaaron Lamar HERO, MD;  Location: [facility] END[facility];  Service: Endoscopy;  Laterality: N/A;   MALONEY DILATION N/A 05/31/2020   Procedure: AGAPITO DILATION;  Surgeon: Shaaron Lamar HERO, MD;  Location: [facility];  Service: Endoscopy;  Laterality: N/A;   RIGHT/LEFT HEART CATH AND CORONARY ANGIOGRAPHY N/A 01/16/2022   Procedure: RIGHT/LEFT HEART CATH AND CORONARY ANGIOGRAPHY;  Surgeon: Claudene Victory ORN, MD;  Location: [facility] INVASIVE CV LAB;  Service: Cardiovascular;  Laterality: N/A;   TEE WITHOUT CARDIOVERSION N/A 02/18/2022   Procedure: TRANSESOPHAGEAL ECHOCARDIOGRAM (TEE);  Surgeon: Rolan Ezra RAMAN, MD;  Location: [facility] ENDOSCOPY;  Service: Cardiovascular;  Laterality: N/A;   TRANSURETHRAL RESECTION OF PROSTATE  01/09/2012   Procedure: TRANSURETHRAL RESECTION OF THE PROSTATE (TURP);  Surgeon: Mohammad I Javaid, MD;  Location: [facility] ORS;  Service: Urology;  Laterality: N/A;   Social History   Social History Narrative  Pt gets regular exercise.Married for 22 years.Retired but still Engineering geologist.   Immunization History  Administered Date(s) Administered   Fluad Quad(high Dose 65+) 12/04/2020   Influenza Split 09/13/2019   Influenza,inj,Quad PF,6+ Mos 08/24/2013, 11/04/2017   Influenza-Unspecified 10/14/2009, 12/07/2010, 09/26/2019   Moderna Sars-Covid-2 Vaccination 02/15/2020, 03/14/2020, 09/27/2020   Pneumococcal Conjugate-13 04/22/2019   Pneumococcal Polysaccharide-23 12/25/2007   Td 02/23/2017     Objective: Vital Signs: There were no vitals taken for this visit.   Physical Exam   Musculoskeletal Exam: ***  CDAI Exam: CDAI Score: -- Patient Global: --; Provider Global: -- Swollen: --; Tender: -- Joint Exam 05/25/2024   No  joint exam has been documented for this visit   There is currently no information documented on the homunculus. Go to the Rheumatology activity and complete the homunculus joint exam.  Investigation: No additional findings.  Imaging: No results found.  Recent Labs: Lab Results  Component Value Date   WBC 9.0 04/02/2023   HGB 13.1 04/02/2023   PLT 171 04/02/2023   NA 143 01/28/2024   K 4.1 01/28/2024   CL 108 (H) 01/28/2024   CO2 22 01/28/2024   GLUCOSE 79 01/28/2024   BUN 33 (H) 01/28/2024   CREATININE 1.94 (H) 01/28/2024   BILITOT 0.6 04/05/2024   ALKPHOS 58 04/05/2024   AST 23 04/05/2024   ALT 22 04/05/2024   PROT 7.6 04/05/2024   ALBUMIN 4.4 04/05/2024   CALCIUM  8.9 01/28/2024   GFRAA 52 (L) 12/04/2020    Speciality Comments: No specialty comments available.  Procedures:  No procedures performed Allergies: Peanut butter flavoring agent (non-screening)   Assessment / Plan:     Visit Diagnoses: No diagnosis found.  Orders: No orders of the defined types were placed in this encounter.  No orders of the defined types were placed in this encounter.   Face-to-face time spent with patient was *** minutes. Greater than 50% of time was spent in counseling and coordination of care.  Follow-Up Instructions: No follow-ups on file.   Lonni LELON Ester, MD  Note - This record has been created using [employer].  Chart creation errors have been sought, but may not always  have been located. Such creation errors do not reflect on  the standard of medical care.

## 2024-05-25 ENCOUNTER — Encounter: Payer: Medicare HMO | Admitting: Internal Medicine

## 2024-06-04 ENCOUNTER — Other Ambulatory Visit: Payer: Self-pay | Admitting: Cardiology

## 2024-06-07 ENCOUNTER — Encounter (INDEPENDENT_AMBULATORY_CARE_PROVIDER_SITE_OTHER): Payer: Self-pay | Admitting: *Deleted

## 2024-06-15 DIAGNOSIS — I25118 Atherosclerotic heart disease of native coronary artery with other forms of angina pectoris: Secondary | ICD-10-CM | POA: Diagnosis not present

## 2024-06-15 DIAGNOSIS — I10 Essential (primary) hypertension: Secondary | ICD-10-CM | POA: Diagnosis not present

## 2024-06-15 DIAGNOSIS — I4891 Unspecified atrial fibrillation: Secondary | ICD-10-CM | POA: Diagnosis not present

## 2024-06-15 DIAGNOSIS — Z299 Encounter for prophylactic measures, unspecified: Secondary | ICD-10-CM | POA: Diagnosis not present

## 2024-06-15 DIAGNOSIS — K219 Gastro-esophageal reflux disease without esophagitis: Secondary | ICD-10-CM | POA: Diagnosis not present

## 2024-06-22 ENCOUNTER — Encounter (INDEPENDENT_AMBULATORY_CARE_PROVIDER_SITE_OTHER): Payer: Self-pay | Admitting: Gastroenterology

## 2024-06-22 ENCOUNTER — Ambulatory Visit (INDEPENDENT_AMBULATORY_CARE_PROVIDER_SITE_OTHER): Admitting: Gastroenterology

## 2024-06-22 VITALS — BP 119/51 | HR 61 | Temp 97.7°F | Ht 65.0 in | Wt 137.0 lb

## 2024-06-22 DIAGNOSIS — I10 Essential (primary) hypertension: Secondary | ICD-10-CM | POA: Diagnosis not present

## 2024-06-22 DIAGNOSIS — R1310 Dysphagia, unspecified: Secondary | ICD-10-CM

## 2024-06-22 DIAGNOSIS — K2100 Gastro-esophageal reflux disease with esophagitis, without bleeding: Secondary | ICD-10-CM

## 2024-06-22 DIAGNOSIS — R1319 Other dysphagia: Secondary | ICD-10-CM

## 2024-06-22 DIAGNOSIS — E785 Hyperlipidemia, unspecified: Secondary | ICD-10-CM

## 2024-06-22 DIAGNOSIS — K200 Eosinophilic esophagitis: Secondary | ICD-10-CM

## 2024-06-22 MED ORDER — PANTOPRAZOLE SODIUM 40 MG PO TBEC: 40.0000 mg | DELAYED_RELEASE_TABLET | Freq: Two times a day (BID) | ORAL | 3 refills | Status: DC

## 2024-06-22 MED ORDER — BUDESONIDE 2 MG/10ML PO SUSP: 2.0000 mg | Freq: Two times a day (BID) | ORAL | 2 refills | Status: DC

## 2024-06-22 NOTE — Patient Instructions (Signed)
 It was very nice to meet you today, as dicussed with will plan for the following :  You have been diagnosed with Eosinophilic Esophagitis (EOE), a chronic inflammatory condition where eosinophils, a type of white blood cell, accumulate in the lining of your esophagus. This build-up can lead to symptoms such as difficulty swallowing, food getting stuck, and chest pain. EOE is often related to food allergies or other environmental factors. Treatment typically involves dietary changes, such as eliminating specific allergens, and medications like proton pump inhibitors or topical steroids to reduce inflammation and manage symptoms. We will work closely with you to develop a personalized treatment plan to help you manage this condition effectively.  START:  1) Budesonide  FDA approved EOE treatment : Budesonide  oral suspension is 2 mg, twice daily, for 12 weeks . Take budesonide  slowly, over 5 to 10 minutes, and not to eat or drink for 30 minutes after taking the medication. Administered as an oral viscous slurry (4 mg total daily dose, generally divided into twice a day). Viscous budesonide  can be compounded by mixing Pulmicort  Respules with sucralose (Splenda; 10 1-gram packets per 1 mg of budesonide , creating a volume of approximately 8 mL)  2) Protonix  40mg  suspension twice daily    3) LAB WORK and than we will plan on starting  Dupilumab

## 2024-06-22 NOTE — Progress Notes (Signed)
 Ronald Mcdowell , M.D. Gastroenterology & Hepatology [facility] Gastroenterology [address] [place], [place] [address] Primary Care Physician: Leavy Waddell NOVAK, FNP [address] [place] [address]  Chief Complaint:  Dysphagia, EOE   History of Present Illness: Ronald Mcdowell is a 78 y.o. male with CAD, HFrEF, NICM, A-fib, NSVT, hypertension, hyperlipidemia, CKD stage IV, and Graves' disease, s/p iodine thyroid  ablation, and recurrent UTIs, who presents for evaluation of dysphagia with history of eosinophilic esophagitis.  Patient reports that for past 4 years he has difficulty swallowing which has been worsening.  Reports difficulty mostly with solids but can also happen with liquids.  Denies any food impactions but is barely able to eat meat.  Patient has been taking Protonix  and omeprazole for years without relief  Last EGD:2021  Cr? pe paper appearing esophageal mucosa diffusely with a ringed appearance. Multiple white tiny pustular areas scattered throughout the esophageal body consistent with eosinophilic abscesses. . More narrowed esophagus proximally. Was able to advance the adult scope through the esophagus with mild resistance. Couple of tiny tears resulted with this maneuver. Stomach normal. First and second portion of the duodenum appeared normal. Scope withdrawn, a 48 French Maloney dilator was passed to full insertion with mild to moderate resistance. Look back revealed multiple superficial tears. About 15 cc of blood loss with this maneuver. Subsequently, biopsies of mid and distal esophagus taken for histologic study Impression:   Abnormal esophagus consistent with EOE. Status post dilation and biopsy   A. ESOPHAGEAL, BIOPSY:  - Squamous mucosa with increased eosinophils consistent with  eosinophilic esophagitis.  - Greater than 30 per high-power field.   2019  Impression: Cervical/ proximal esophageal web dilated as described above.  Markedly abnormal esophagus suspicious for EOE? biopsied. - Small hiatal hernia. - The examination was otherwise normal. - Normal duodenal bulb and second portion of the duodenum. - Normal stomach. - No specimens collected.  Esophagus, biopsy - SQUAMOUS MUCOSA WITH INCREASED EOSINOPHILS CONSISTENT WITH EOSINOPHILIC ESOPHAGITIS. - GREATER THAN 30 PER HIGH POWER FIELD. - NO DYSPLASIA OR MALIGNANCY  Last Colonoscopy:2017  Normal ileocolonoscopy ( anal canal hemorrhoids)  Past Medical History: Past Medical History:  Diagnosis Date   Anemia    Atrial fibrillation (HCC)    CHF (congestive heart failure) (HCC)    Chronic kidney disease, unspecified 01/14/2020   Coronary artery disease    a. h/o MI in 2000 w/ prior LCX stenting;  b. 7.2014 Abnl Cardiolite , EF 41%, mod-large inferolat scar;  c. 07/2013 Cath/PCI: LM nl, LAD 10-20, D1 40-50p, LCX 95-99 @ distal stent margin (2.5x20 Promus Premier DES), RCA dom, 40p, 11m, 70d, EF 35-40%.   Elevated cholesterol    GERD (gastroesophageal reflux disease)    Graves disease 04/2020   s/p I-131 thyroid  ablation    Hemorrhoids    Hypertension    Hyperthyroidism 09/27/2020   Ischemic cardiomyopathy    a. 07/2013 EF 35-40% by LV gram.; EF 50% on ECHO in November 2020   Myocardial infarction [facility])    Reflux esophagitis    Spondylosis of cervical joint    C3-4, C6-7   Tobacco user    Vitamin D  deficiency disease 08/24/2019    Past Surgical History: Past Surgical History:  Procedure Laterality Date   BIOPSY  01/09/2018   Procedure: BIOPSY;  Surgeon: Shaaron Lamar HERO, MD;  [facility] ENDO [facility];  Service: Endoscopy;;  esophageal biopsies   CARDIAC CATHETERIZATION     CARDIOVERSION N/A 02/18/2022  Procedure: CARDIOVERSION;  Surgeon: Rolan Ezra RAMAN, MD;  Location: [facility] ENDOSCOPY;  Service: Cardiovascular;  Laterality: N/A;   COLONOSCOPY     COLONOSCOPY N/A 01/25/2016   RMR: normal ileocolonoscopy ( anal canal hemorrhoids)   CORONARY ANGIOPLASTY  WITH STENT PLACEMENT  08/23/2013   mid circumflex  DES    by Dr Swaziland   CORONARY STENT PLACEMENT  2000   ESOPHAGOGASTRODUODENOSCOPY N/A 01/25/2016   RMR: Reflux esophagitits. Cervical web with passage of the scope. Status post esophageal biosy. Hiatal hernia.    ESOPHAGOGASTRODUODENOSCOPY N/A 01/09/2018   Rourk: cervical/proximal esophageal web s/p dilation, markedly abnormal esophagus c/w biopsy proven eosinophilic esophagitis. small hiatal hernia.   ESOPHAGOGASTRODUODENOSCOPY N/A 05/31/2020   Procedure: ESOPHAGOGASTRODUODENOSCOPY (EGD);  Surgeon: Shaaron Lamar HERO, MD;  Location: [facility];  Service: Endoscopy;  Laterality: N/A;  8:30am   LEFT HEART CATHETERIZATION WITH CORONARY ANGIOGRAM N/A 08/23/2013   Procedure: LEFT HEART CATHETERIZATION WITH CORONARY ANGIOGRAM;  Surgeon: Peter M Swaziland, MD;  Location: [facility] CATH LAB;  Service: Cardiovascular;  Laterality: N/A;   MALONEY DILATION N/A 01/09/2018   Procedure: AGAPITO DILATION;  Surgeon: Shaaron Lamar HERO, MD;  Location: [facility] END[facility];  Service: Endoscopy;  Laterality: N/A;   MALONEY DILATION N/A 05/31/2020   Procedure: AGAPITO DILATION;  Surgeon: Shaaron Lamar HERO, MD;  Location: [facility];  Service: Endoscopy;  Laterality: N/A;   RIGHT/LEFT HEART CATH AND CORONARY ANGIOGRAPHY N/A 01/16/2022   Procedure: RIGHT/LEFT HEART CATH AND CORONARY ANGIOGRAPHY;  Surgeon: Claudene Victory ORN, MD;  Location: [facility] INVASIVE CV LAB;  Service: Cardiovascular;  Laterality: N/A;   TEE WITHOUT CARDIOVERSION N/A 02/18/2022   Procedure: TRANSESOPHAGEAL ECHOCARDIOGRAM (TEE);  Surgeon: Rolan Ezra RAMAN, MD;  Location: [facility] ENDOSCOPY;  Service: Cardiovascular;  Laterality: N/A;   TRANSURETHRAL RESECTION OF PROSTATE  01/09/2012   Procedure: TRANSURETHRAL RESECTION OF THE PROSTATE (TURP);  Surgeon: Mohammad I Javaid, MD;  Location: [facility] ORS;  Service: Urology;  Laterality: N/A;    Family History: Family History  Problem Relation Age of Onset   Early death Mother        childbirth    Heart attack Father    Cancer Father    Heart disease Brother        CABG   Heart disease Brother        slight heart attack   Anesthesia problems Neg Hx    Hypotension Neg Hx    Malignant hyperthermia Neg Hx    Pseudochol deficiency Neg Hx    Colon cancer Neg Hx    Liver disease Neg Hx    Gastric cancer Neg Hx    Esophageal cancer Neg Hx     Social History: Social History   Tobacco Use  Smoking Status Every Day   Current packs/day: 0.50   Average packs/day: 0.5 packs/day for 58.5 years (29.3 ttl pk-yrs)   Types: Cigarettes   Start date: 12/09/1965  Smokeless Tobacco Never  Tobacco Comments   3/4 pack of cigarettes daily; smoked since 78 years old   Social History   Substance and Sexual Activity  Alcohol  Use No   Alcohol /week: 0.0 standard drinks of alcohol    Comment: No bowel since year 2000.  Some previous heavy use.   Social History   Substance and Sexual Activity  Drug Use No    Allergies: Allergies  Allergen Reactions   Peanut Butter Flavoring Agent (Non-Screening) Swelling    Medications: Current Outpatient Medications  Medication Sig Dispense Refill   amiodarone  (PACERONE ) 200 MG tablet  TAKE 1 TABLET BY MOUTH DAILY 90 tablet 3   aspirin  EC 81 MG tablet Take 81 mg by mouth daily. Swallow whole.     atorvastatin  (LIPITOR ) 80 MG tablet TAKE 1 TABLET BY MOUTH DAILY 90 tablet 3   Budesonide  2 MG/10ML SUSP Take 2 mg by mouth in the morning and at bedtime. FDA approved EOE treatment : Budesonide  oral suspension is 2 mg, twice daily, for 12 weeks . Take budesonide  slowly, over 5 to 10 minutes, and not to eat or drink for 30 minutes after taking the medication. Administered as an oral viscous slurry (4 mg total daily dose, generally divided into twice a day). Viscous budesonide  can be compounded by mixing Pulmicort  Respules with sucralose (Splenda; 10 1-gram packets per 1 mg of budesonide , creating a volume of approximately 8 mL) 120 mL 2   hydrALAZINE  (APRESOLINE )  50 MG tablet Take 1.5 tablets (75 mg total) by mouth 3 (three) times daily. 405 tablet 2   isosorbide  mononitrate (IMDUR ) 60 MG 24 hr tablet Take 1 tablet (60 mg total) by mouth daily. 90 tablet 3   nitroGLYCERIN  (NITROSTAT ) 0.4 MG SL tablet Place 1 tablet (0.4 mg total) under the tongue every 5 (five) minutes as needed for chest pain. 25 tablet 3   potassium chloride  (KLOR-CON ) 10 MEQ tablet TAKE 1 TABLET BY MOUTH ONCE DAILY 90 tablet 0   spironolactone  (ALDACTONE ) 25 MG tablet Take 0.5 tablets (12.5 mg total) by mouth daily. 45 tablet 2   Cholecalciferol  (VITAMIN D -3) 125 MCG (5000 UT) TABS Take 1 tablet by mouth once a week. (Patient not taking: Reported on 06/22/2024)     pantoprazole  (PROTONIX ) 40 MG tablet Take 1 tablet (40 mg total) by mouth 2 (two) times daily. 90 tablet 3   No current facility-administered medications for this visit.    Review of Systems: GENERAL: negative for malaise, night sweats HEENT: No changes in hearing or vision, no nose bleeds or other nasal problems. NECK: Negative for lumps, goiter, pain and significant neck swelling RESPIRATORY: Negative for cough, wheezing CARDIOVASCULAR: Negative for chest pain, leg swelling, palpitations, orthopnea GI: SEE HPI MUSCULOSKELETAL: Negative for joint pain or swelling, back pain, and muscle pain. SKIN: Negative for lesions, rash HEMATOLOGY Negative for prolonged bleeding, bruising easily, and swollen nodes. ENDOCRINE: Negative for cold or heat intolerance, polyuria, polydipsia and goiter. NEURO: negative for tremor, gait imbalance, syncope and seizures. The remainder of the review of systems is noncontributory.   Physical Exam: BP (!) 119/51   Pulse 61   Temp 97.7 F (36.5 C)   Ht 5' 5 (1.651 m)   Wt 137 lb (62.1 kg)   BMI 22.80 kg/m  GENERAL: The patient is AO x3, in no acute distress. HEENT: Head is normocephalic and atraumatic. EOMI are intact. Mouth is well hydrated and without lesions. NECK: Supple. No  masses LUNGS: Clear to auscultation. No presence of rhonchi/wheezing/rales. Adequate chest expansion HEART: RRR, normal s1 and s2. ABDOMEN: Soft, nontender, no guarding, no peritoneal signs, and nondistended. BS +. No masses.    Imaging/Labs: as above     Latest Ref Rng & Units 04/02/2023   10:11 AM 12/03/2022   11:45 AM 10/21/2022    8:34 AM  CBC  WBC 4.0 - 10.5 K/uL 9.0  11.5  11.2   Hemoglobin 13.0 - 17.0 g/dL 86.8  86.7  86.6   Hematocrit 39.0 - 52.0 % 39.1  39.1  39.5   Platelets 150 - 400 K/uL 171  180  190    Lab Results  Component Value Date   IRON 50 06/08/2020   TIBC 204 (L) 06/08/2020   FERRITIN 183 01/15/2022    I personally reviewed and interpreted the available labs, imaging and endoscopic files.  Impression and Plan:  ZEBULUN DEMAN is a 78 y.o. male with CAD, HFrEF, NICM, A-fib, NSVT, hypertension, hyperlipidemia, CKD stage IV, and Graves' disease, s/p iodine thyroid  ablation, and recurrent UTIs, who presents for evaluation of dysphagia with history of eosinophilic esophagitis.  # Dysphagia # Eosinophilic esophagitis-non PPI responder  Patient has documented biopsy-proven eosinophilic esophagitis at least since 2019 with GREATER THAN 30 PER HIGH POWER FIELD eosinophils  Patient has been on omeprazole and pantoprazole  and has been on a proton pump inhibitor nonresponder  As per ACG guideline patient with confirmed diagnosis of EOE and PPI nonresponder next step would be to start dupilumab (300 mg subcutaneously once weekly injection)  But before that we will check HIV, hepatitis profile and QuantiFERON  For now continue  PPI twice daily  Until  dupilumab is approved we will give a trial of Budesonide  also   FDA approved EOE treatment : Budesonide  oral suspension is 2 mg, twice daily, for 12 weeks . Take budesonide  slowly, over 5 to 10 minutes, and not to eat or drink for 30 minutes after taking the medication. Administered as an oral viscous slurry (4 mg  total daily dose, generally divided into twice a day). Viscous budesonide  can be compounded by mixing Pulmicort  Respules with sucralose (Splenda; 10 1-gram packets per 1 mg of budesonide , creating a volume of approximately 8 mL)  The endpoints of therapy of EoE include improvements in clinical symptoms and esophageal eosinophilic infl ammation. While complete resolution of symptoms and pathology is an ideal endpoint, acceptance of a range of reductions in symptoms and histology is a more realistic and practical goal in clinical practice. (Recommendation conditional, evidence low)  Since it has been 4 years since last endoscopy we will plan on upper endoscopy with dilation to address any prominent strictures  Again patient was counseled not to start dupilumab until we get blood work results but we will start the process for now   All questions were answered.      Raynisha Avilla Faizan Alaylah Heatherington, MD Gastroenterology and Hepatology [facility] Gastroenterology   This chart has been completed using [facility] Dictation software, and while attempts have been made to ensure accuracy , certain words and phrases may not be transcribed as intended

## 2024-06-23 ENCOUNTER — Telehealth (INDEPENDENT_AMBULATORY_CARE_PROVIDER_SITE_OTHER): Payer: Self-pay | Admitting: Gastroenterology

## 2024-06-23 DIAGNOSIS — R1319 Other dysphagia: Secondary | ICD-10-CM

## 2024-06-23 DIAGNOSIS — K200 Eosinophilic esophagitis: Secondary | ICD-10-CM

## 2024-06-23 NOTE — Telephone Encounter (Signed)
 PA completed for Budesonide  suspension. Awaiting determination  Jeralyn [place], pharmacy from [facility], left voicemail that he had questions in regards to how the formula was written. Jeralyn states the solution can be compounded but it would be expensive and spoke about a PA. Jeralyn also states the if he compounds the solution he can tweak the formula to add a thickener that would adhere more to the esophagus. Returned call to [place]. Jeralyn stated that he sent over a PA and wanted to make sure we received it. Advised him I did and I am going to begin working on that as soon as I can due being the only nurse here this week.

## 2024-06-24 ENCOUNTER — Ambulatory Visit (INDEPENDENT_AMBULATORY_CARE_PROVIDER_SITE_OTHER): Admitting: Nurse Practitioner

## 2024-06-24 DIAGNOSIS — I5020 Unspecified systolic (congestive) heart failure: Secondary | ICD-10-CM

## 2024-06-24 NOTE — Telephone Encounter (Signed)
 Pt wife left voicemail stating that the pill we called in was to high. Asked for a call back Returned call to wife. Wife states the co pay is going to be $200 and he can not afford that. Advised pt wife that we are going to try to get Dupixent covered for pt. I asked wife if pt could come by and sign the paperwork for Dupixent. Pt wife states she will call patient and have him come by

## 2024-06-24 NOTE — Telephone Encounter (Signed)
 Fax from [facility] stating [name]  2 mg was denied. The drug is not on preferred drug list. Preferred drugs are Dupixent Pen subcutaneous pen injector; Dupixent subcutaneous syringe. Will send in Dupixent form after pt signs form and labs are received.

## 2024-06-24 NOTE — Patient Instructions (Signed)
Medication Instructions:    Labwork:   Testing/Procedures:   Follow-Up: Your physician recommends that you schedule a follow-up appointment in:   Any Other Special Instructions Will Be Listed Below (If Applicable).  If you need a refill on your cardiac medications before your next appointment, please call your pharmacy. 

## 2024-06-25 LAB — QUANTIFERON-TB GOLD PLUS
QuantiFERON Mitogen Value: >10 [IU]/mL
QuantiFERON Nil Value: 0.05 [IU]/mL
QuantiFERON TB1 Ag Value: 0.06 [IU]/mL
QuantiFERON TB2 Ag Value: 0.05 [IU]/mL

## 2024-06-25 LAB — HIV ANTIBODY (ROUTINE TESTING W REFLEX): HIV Screen 4th Generation wRfx: NONREACTIVE

## 2024-06-25 LAB — HEPATITIS B CORE ANTIBODY, TOTAL: Hep B Core Total Ab: NEGATIVE

## 2024-06-25 LAB — HEPATITIS B SURFACE ANTIBODY,QUALITATIVE: Hep B Surface Ab, Qual: NONREACTIVE

## 2024-06-25 LAB — HEPATITIS B SURFACE ANTIGEN: Hepatitis B Surface Ag: NEGATIVE

## 2024-06-25 LAB — HEPATITIS C ANTIBODY: Hep C Virus Ab: NONREACTIVE

## 2024-06-25 LAB — HEPATITIS A ANTIBODY, TOTAL: hep A Total Ab: POSITIVE — AB

## 2024-06-28 ENCOUNTER — Ambulatory Visit (INDEPENDENT_AMBULATORY_CARE_PROVIDER_SITE_OTHER): Payer: Self-pay | Admitting: Gastroenterology

## 2024-06-28 NOTE — Progress Notes (Signed)
 Hi [name] ,  Can you please call the patient and tell the patient the lab work was negative for hepatitis B and C, HIV and TB.  It shows he is immune against hepatitis A.  All this is good news.  We will start process to get him injections for EOE ; Dupixent  Thanks,  Kelson Queenan Faizan Trayce Caravello, MD Gastroenterology and Hepatology [facility] Gastroenterology  =============  Hepatitis A immune Hepatitis B core negative Hepatitis B surface antibody negative Hepatitis B surface antigen negative Hepatitis C negative HIV negative QuantiFERON negative

## 2024-06-30 ENCOUNTER — Other Ambulatory Visit (HOSPITAL_COMMUNITY): Payer: Self-pay | Admitting: Family Medicine

## 2024-06-30 NOTE — Telephone Encounter (Signed)
 Pt contacted and verbalized understanding. Will send lab results, recent office note and Dupixent form to Dupixent.

## 2024-06-30 NOTE — Telephone Encounter (Signed)
 Fax from [facility] stating that are needing Patient consent and a prescription. Pt consent was faxed. Will fax again with prescription. Could you print a prescription for Dupixent for this pt and I will fax it. Thank you!

## 2024-07-02 ENCOUNTER — Encounter: Payer: Self-pay | Admitting: *Deleted

## 2024-07-02 ENCOUNTER — Telehealth: Payer: Self-pay | Admitting: *Deleted

## 2024-07-02 NOTE — Telephone Encounter (Signed)
 [facility] PA: Approved Authorization #[id number]  Tracking #[id number] Dates of service 08/17/2024 - 11/16/2024

## 2024-07-05 MED ORDER — DUPIXENT 300 MG/2ML ~~LOC~~ SOSY: 300.0000 mg | PREFILLED_SYRINGE | SUBCUTANEOUS | 3 refills | Status: DC

## 2024-07-05 NOTE — Addendum Note (Signed)
 Addended by: CINDERELLA DEATRICE SMILES on: 07/05/2024 04:47 PM   Modules accepted: Orders

## 2024-07-05 NOTE — Telephone Encounter (Signed)
 Prescribed , please fax it. Thank you

## 2024-07-06 NOTE — Telephone Encounter (Signed)
 PA initiated through CoverMyMeds. Awaiting determination.

## 2024-07-07 ENCOUNTER — Other Ambulatory Visit (INDEPENDENT_AMBULATORY_CARE_PROVIDER_SITE_OTHER): Payer: Self-pay

## 2024-07-07 ENCOUNTER — Other Ambulatory Visit: Payer: Self-pay | Admitting: *Deleted

## 2024-07-07 DIAGNOSIS — R1319 Other dysphagia: Secondary | ICD-10-CM

## 2024-07-07 DIAGNOSIS — K200 Eosinophilic esophagitis: Secondary | ICD-10-CM

## 2024-07-07 MED ORDER — DUPIXENT 300 MG/2ML ~~LOC~~ SOSY: 300.0000 mg | PREFILLED_SYRINGE | SUBCUTANEOUS | 3 refills | Status: AC

## 2024-07-07 MED ORDER — DUPIXENT 300 MG/2ML ~~LOC~~ SOSY: 300.0000 mg | PREFILLED_SYRINGE | SUBCUTANEOUS | 3 refills | Status: DC

## 2024-07-07 NOTE — Telephone Encounter (Signed)
 Script printed and awaiting signature. Script on provider desk  Fax from [facility] stating that Dupixent  has been denied.  Has been Approved for/through 11/24/24 for up to a 30 day supply. Plan only allows 30 days supply

## 2024-07-13 NOTE — Telephone Encounter (Signed)
 Script sent to Dupixent .

## 2024-07-13 NOTE — Telephone Encounter (Signed)
 Copied email chain below. Pt contacted but asked that I call his wife to give information. Contacted wife and advised her to call [facility] at [phone] to discuss cost and scheduling.      Walterine Merle, spoke to [facility] and they are able to fill 30 day supplies (actually 28 days) using the existing approval/denial and the Rx you all sent.  Pt needs to contact them directly to discuss cost and scheduling.      If there are other resources or questions needed the patient can call [employer]  My Way at [phone].      I am not able to forward approval or denial letters to [facility].  Are you able to fax the letter from [facility] to [facility]W at [phone]?     Also, please feel free to call me if you have any other questions.     Thank you,        Selinda Glatter  [facility] Reimbursement Manager - [place], [place]  Pulmonology & Gastroenterology  Mobile:  [phone]  [email or web address]                    For background information only. This information has not been reviewed or approved by a Company review committee. Do not provide to a healthcare professional or any external party; do not duplicate, distribute or use for promotional purposes.           From: Arthurine Merle @[email or web address]> Sent: Tuesday, July 13, 2024 10:03 AM To: Glatter Selinda /US  [email or web address]> Subject: Re: Dupixent  Case PRJ-[id number] Dr. Cinderella- Encrypted Email w/PHI Included     Wonderful! Thank you!     Masson Nalepa,LPN   [email or web address]  [facility] GI @ [address] [place]  [phone]  [phone]  From: Glatter Selinda /US  [email or web address]> Sent: Tuesday, July 13, 2024 9:55 AM To: Arthurine Merle @[email or web address]> Subject: RE: Dupixent  Case PRJ-[id number] Dr. Cinderella- Encrypted Email w/PHI Included     It appears that they are just denying the request for the quantity of 90 days.  I will reach out to  [facility] to see if they are able to fill the lesser quantity of 30 days without any additional work on your part.  Stay tuned.     Selinda Glatter  [facility] Reimbursement Manager - [place], [place]  Pulmonology & Gastroenterology  Mobile:  [phone]  [email or web address]                    For background information only. This information has not been reviewed or approved by a Company review committee. Do not provide to a healthcare professional or any external party; do not duplicate, distribute or use for promotional purposes.           From: Arthurine Merle @[email or web address]> Sent: Tuesday, July 13, 2024 9:36 AM To: Glatter Selinda /US  [email or web address]> Subject: Re: Dupixent  Case PRJ-[id number] Dr. Cinderella- Encrypted Email w/PHI Included     Good morning. I have attached the signed script and the fax from [facility]. Please let me know if you need anything else. thank you!!!     Rifky Lapre,LPN   [email or web address]  [facility] GI @ [place]  [phone]  [phone]  From: Glatter Selinda /US  [email or web address]> Sent: Wednesday, July 07, 2024 9:23 PM To: Arthurine Merle @[email or web address]> Subject: RE: Dupixent  Case PRJ-[id number] Dr. Cinderella- Encrypted Email w/PHI Included     Walterine Merle, thanks for the update.  Definitely send us  the document stating it was approved.  I will follow up with the  pharmacy after you send the Rx so we can figure out what is going on.  We will also need that PA Approval if the pt calls us  for any resources.     Thanks again.      Selinda Glatter  [facility] Reimbursement Manager - [place], [place]  Pulmonology & Gastroenterology  Mobile:  [phone]  [email or web address]                    For background information only. This information has not been reviewed or approved by a Company review committee. Do not provide to a healthcare professional or  any external party; do not duplicate, distribute or use for promotional purposes.           From: Arthurine Merle @[email or web address] .[email or web address]> Sent: Wednesday, July 07, 2024 8:18 AM To: Glatter Selinda /US  [email or web address]> Subject: Re: Dupixent  Case PRJ-[id number] Dr. Cinderella- Encrypted Email w/PHI Included     Good morning Selinda! I hope you are well. I completed a PA via Cover My Meds for [place] [facility]ent  but it was denied. In contradiction, it states approved for/through 11/24/24 for up to a 30 day supply by [facility] Coverage. I will be faxing over the script once the provider has signed it.   Have a wonderful day!!     Laurel Harnden,LPN   [email or web address] .[facility] GI @ [address] [place]  [phone]  [phone]  From: Glatter Selinda /US  [email or web address]> Sent: Thursday, July 01, 2024 12:21 PM To: Arthurine Merle @[email or web address]> Subject: RE: Dupixent  Case PRJ-[id number] Dr. Cinderella- Encrypted Email w/PHI Included     Yes you can initiate the PA in covermymeds if you all have an account and your are familiar with that system.    PAP was reviewed and denied.  The paperwork showing household size and income was not acceptable documentation for pap purposes.     Selinda Glatter  [facility] Reimbursement Manager - [place], [place]  Pulmonology & Gastroenterology  Mobile:  [phone]  [email or web address]                    For background information only. This information has not been reviewed or approved by a Company review committee. Do not provide to a healthcare professional or any external party; do not duplicate, distribute or use for promotional purposes.           From: Arthurine Merle @[email or web address]> Sent: Thursday, July 01, 2024 12:18 PM To: Glatter Selinda /US  [email or web address]> Subject: Re: Dupixent  Case PRJ-[id number] Dr. Cinderella- Encrypted Email w/PHI  Included     Yes sir. Thank you! Do I need to initiate the PA through Cover My Meds?   Could you give me the name of the patient from last week?  I was the only nurse in the office last week and I do not remember what I completed on who.   Thank you for all of your help!     Cypress Hinkson,LPN   [email or web address]  [facility] GI @ [address] [place]  [phone]  [phone]  From: Glatter Selinda /US  [email or web address]> Sent: Thursday, July 01, 2024 11:51 AM To: Arthurine Merle @[email or web address]> Subject: RE: Dupixent  Case PRJ-[id number] Dr. Cinderella- Encrypted Email w/PHI Included     No apology needed at all.  My job is to help with enrollment process and approval of medication.  If you prefer to discuss the process, how to complete our form or anything else related to Dupixent  feel free to give me a call or we can schedule a  time for me to come by and go through it.     Please go ahead and:  initiate the PA for Dupixent  once you receive a decision letter on Dupixent  you can fax that to [facility]W at [phone] along with the printed Rx If the decision comes back approved, then you can also send an E Rx to [facility].        Finally, you also submitted a pt last week for Dr. Angelia and we denied it for PAP.  Let me know if you have any questions about that pt also.     Selinda Glatter  [facility] Reimbursement Manager - [place], [place]  Pulmonology & Gastroenterology  Mobile:  [phone]  [email or web address]                    For background information only. This information has not been reviewed or approved by a Company review committee. Do not provide to a healthcare professional or any external party; do not duplicate, distribute or use for promotional purposes.           From: Arthurine Merle @[email or web address]> Sent: Thursday, July 01, 2024 11:43 AM To: Glatter Selinda /US   [email or web address]> Subject: Re: Dupixent  Case PRJ-[id number] Dr. Cinderella- Encrypted Email w/PHI Included     You're welcome. I have not completed a PA for Dupixent , but I can initiate a PA for Dupixent . I may have filled the from out wrong. We have not sent a script to [facility]. Do we need to do that? I do apologize. I am new to this role and am still learning about all the specialty medications and the process.   Thank you!     Jenascia Bumpass,LPN   [email or web address]  [facility] GI @ [address] [place]  [phone]  [phone]  From: Glatter Selinda /US  [email or web address]> Sent: Thursday, July 01, 2024 11:37 AM To: Arthurine Merle @[email or web address]> Subject: RE: Dupixent  Case PRJ-[id number] Dr. Cinderella- Encrypted Email w/PHI Included     Thank you Casimiro Lienhard.  Please include the approval for Dupixent  when you send the Rx.     Also, the way the enrollment form was completed indicates you sent a Rx to [facility] for the pt.  Is that correct?  Have you already sent them an Rx?           Selinda Glatter  [facility] Reimbursement Manager - [place], [place]  Pulmonology & Gastroenterology  Mobile:  [phone]  [email or web address]                    For background information only. This information has not been reviewed or approved by a Company review committee. Do not provide to a healthcare professional or any external party; do not duplicate, distribute or use for promotional purposes.           From: Arthurine Merle @[email or web address]> Sent: Thursday, July 01, 2024 11:32 AM To: Glatter Selinda /US  [email or web address]> Subject: Re: Dupixent  Case PRJ-[id number] Dr. Cinderella- Encrypted Email w/PHI Included     Good morning, Selinda. I am doing well. I hope you are! Yes, we have received a denial for [name]  2 mg, and it stated that the preferred was Dupixent . I can  include that once I have the script. I can fax a submitted script to you tomorrow. The prescribing physician is at the hospital doing procedures today but will be in the office tomorrow. Please let me know if you need anything else. Thank you!!     Merle  Gurdeep Keesey,LPN   [email or web address] [email or web address]  [facility] GI @ [address] [place]  [phone]  [phone]  From: Merilee Mayo /US  [email or web address]> Sent: Thursday, July 01, 2024 10:50 AM To: Arthurine Merle @[email or web address]> Subject: RE: Dupixent  Case PRJ-[id number] Dr. Cinderella- Encrypted Email w/PHI Included     Merle, I apologize but I need to clarify my first email.  Please submit a printed script to DMW at [phone]; not the enrollment form.     Thank you      Mayo Merilee  [facility] Reimbursement Manager - [place], [place]  Pulmonology & Gastroenterology  Mobile:  [phone]  [email or web address]                    For background information only. This information has not been reviewed or approved by a Company review committee. Do not provide to a healthcare professional or any external party; do not duplicate, distribute or use for promotional purposes.           From: Merilee Mayo /US  Sent: Thursday, July 01, 2024 10:46 AM To: [email or web address] Subject: Dupixent  Case PRJ-[id number] Dr. Cinderella- Encrypted Email w/PHI Included     Good morning Merle, I hope you are doing well.  I am the [facility] for Dupixent  in your area and I am reaching out in reference to your patient below:     Pt name is Broxton Broady  Pt DOB is 2046/02/18     The Rx section of the enrollment form was left blank for device type and dose.  Please complete this section of the enrollment form or submit a printed ERx to [facility]W at [phone].  Please disregard if you have already completed this request.     Also, I am curious if a prior authorization has been  initiated with the pt's insurance?  If so please fax the decision letter from the payer to [facility]W at [phone] once you receive it.  If you have any questions or would like assistance with the enrollment or pa process feel free to reach out to me directly.     Thank you,        Mayo Merilee  [facility] Reimbursement Manager - [place], [place]  Pulmonology & Gastroenterology  Mobile:  [phone]  [email or web address]                    CONFIDENTIALITY NOTICE. This electronic mail transmission may contain privileged, confidential and/or protected health information and is intended only for the review of the party to whom it is addressed.  Any unauthorized use or disclosure of the information contained herein may be a violation of federal law, including the [employer] and Accountability Act (HIPAA).  If you have received this transmission in error, please immediately return it to the sender, delete it and destroy it without reading it.  Unintended transmission shall not constitute the waiver of the attorney-client or any other privilege.     MAT-US -[id number]-v1.0-08/2019

## 2024-08-07 DIAGNOSIS — H5213 Myopia, bilateral: Secondary | ICD-10-CM | POA: Diagnosis not present

## 2024-08-16 NOTE — Progress Notes (Signed)
 Patient cancelled visit

## 2024-08-17 ENCOUNTER — Encounter (HOSPITAL_COMMUNITY)
Admission: RE | Disposition: A | Discharge status: Home / Self Care | Payer: Self-pay | Source: Home / Self Care | Attending: Gastroenterology

## 2024-08-17 ENCOUNTER — Other Ambulatory Visit: Payer: Self-pay

## 2024-08-17 ENCOUNTER — Ambulatory Visit (HOSPITAL_COMMUNITY): Admitting: Anesthesiology

## 2024-08-17 ENCOUNTER — Encounter (HOSPITAL_COMMUNITY): Payer: Self-pay | Admitting: Gastroenterology

## 2024-08-17 ENCOUNTER — Ambulatory Visit (HOSPITAL_BASED_OUTPATIENT_CLINIC_OR_DEPARTMENT_OTHER): Admitting: Anesthesiology

## 2024-08-17 ENCOUNTER — Ambulatory Visit (HOSPITAL_COMMUNITY)
Admission: RE | Admit: 2024-08-17 | Discharge: 2024-08-17 | Disposition: A | Discharge status: Home / Self Care | Attending: Gastroenterology | Admitting: Gastroenterology

## 2024-08-17 ENCOUNTER — Telehealth (INDEPENDENT_AMBULATORY_CARE_PROVIDER_SITE_OTHER): Payer: Self-pay | Admitting: Gastroenterology

## 2024-08-17 DIAGNOSIS — F17200 Nicotine dependence, unspecified, uncomplicated: Secondary | ICD-10-CM | POA: Diagnosis not present

## 2024-08-17 DIAGNOSIS — K3189 Other diseases of stomach and duodenum: Secondary | ICD-10-CM | POA: Diagnosis not present

## 2024-08-17 DIAGNOSIS — I252 Old myocardial infarction: Secondary | ICD-10-CM | POA: Insufficient documentation

## 2024-08-17 DIAGNOSIS — R1310 Dysphagia, unspecified: Secondary | ICD-10-CM | POA: Diagnosis not present

## 2024-08-17 DIAGNOSIS — N189 Chronic kidney disease, unspecified: Secondary | ICD-10-CM | POA: Diagnosis not present

## 2024-08-17 DIAGNOSIS — I2510 Atherosclerotic heart disease of native coronary artery without angina pectoris: Secondary | ICD-10-CM | POA: Insufficient documentation

## 2024-08-17 DIAGNOSIS — E7800 Pure hypercholesterolemia, unspecified: Secondary | ICD-10-CM | POA: Insufficient documentation

## 2024-08-17 DIAGNOSIS — K222 Esophageal obstruction: Secondary | ICD-10-CM | POA: Insufficient documentation

## 2024-08-17 DIAGNOSIS — I130 Hypertensive heart and chronic kidney disease with heart failure and stage 1 through stage 4 chronic kidney disease, or unspecified chronic kidney disease: Secondary | ICD-10-CM | POA: Diagnosis not present

## 2024-08-17 DIAGNOSIS — K200 Eosinophilic esophagitis: Secondary | ICD-10-CM | POA: Insufficient documentation

## 2024-08-17 DIAGNOSIS — I509 Heart failure, unspecified: Secondary | ICD-10-CM | POA: Diagnosis not present

## 2024-08-17 DIAGNOSIS — I4891 Unspecified atrial fibrillation: Secondary | ICD-10-CM | POA: Diagnosis not present

## 2024-08-17 DIAGNOSIS — K208 Other esophagitis without bleeding: Secondary | ICD-10-CM | POA: Diagnosis not present

## 2024-08-17 DIAGNOSIS — I5020 Unspecified systolic (congestive) heart failure: Secondary | ICD-10-CM | POA: Diagnosis not present

## 2024-08-17 DIAGNOSIS — E785 Hyperlipidemia, unspecified: Secondary | ICD-10-CM

## 2024-08-17 DIAGNOSIS — K2100 Gastro-esophageal reflux disease with esophagitis, without bleeding: Secondary | ICD-10-CM

## 2024-08-17 DIAGNOSIS — I10 Essential (primary) hypertension: Secondary | ICD-10-CM

## 2024-08-17 HISTORY — PX: ESOPHAGOGASTRODUODENOSCOPY: SHX5428

## 2024-08-17 HISTORY — PX: ESOPHAGEAL DILATION: SHX303

## 2024-08-17 SURGERY — EGD (ESOPHAGOGASTRODUODENOSCOPY)
Anesthesia: General

## 2024-08-17 MED ORDER — LACTATED RINGERS IV SOLN: INTRAVENOUS | Status: DC

## 2024-08-17 MED ORDER — LACTATED RINGERS IV SOLN: INTRAVENOUS | Status: DC | PRN

## 2024-08-17 MED ORDER — LIDOCAINE 2% (20 MG/ML) 5 ML SYRINGE
INTRAMUSCULAR | Status: DC | PRN
  Administered 2024-08-17: 60 mg via INTRAVENOUS
  Administered 2024-08-17: 40 mg via INTRAVENOUS

## 2024-08-17 MED ORDER — BUDESONIDE 2 MG/10ML PO SUSP: 2.0000 mg | Freq: Two times a day (BID) | ORAL | 2 refills | Status: DC

## 2024-08-17 MED ORDER — PROPOFOL 10 MG/ML IV BOLUS
INTRAVENOUS | Status: DC | PRN
  Administered 2024-08-17: 30 mg via INTRAVENOUS
  Administered 2024-08-17: 20 mg via INTRAVENOUS
  Administered 2024-08-17: 30 mg via INTRAVENOUS
  Administered 2024-08-17: 80 mg via INTRAVENOUS
  Administered 2024-08-17: 125 ug/kg/min via INTRAVENOUS

## 2024-08-17 MED ORDER — GLYCOPYRROLATE PF 0.2 MG/ML IJ SOSY
PREFILLED_SYRINGE | INTRAMUSCULAR | Status: DC | PRN
  Administered 2024-08-17 (×2): .1 mg via INTRAVENOUS

## 2024-08-17 MED ORDER — PANTOPRAZOLE SODIUM 40 MG PO TBEC: 40.0000 mg | DELAYED_RELEASE_TABLET | Freq: Two times a day (BID) | ORAL | 3 refills | Status: AC

## 2024-08-17 MED ORDER — EOHILIA 2 MG/10ML PO SUSP: 2.0000 mg | Freq: Two times a day (BID) | ORAL | Status: DC

## 2024-08-17 NOTE — Discharge Instructions (Addendum)
  Discharge instructions Please read the instructions outlined below and refer to this sheet in the next few weeks. These discharge instructions provide you with general information on caring for yourself after you leave the hospital. Your doctor may also give you specific instructions. While your treatment has been planned according to the most current medical practices available, unavoidable complications occasionally occur. If you have any problems or questions after discharge, please call your doctor. ACTIVITY You may resume your regular activity but move at a slower pace for the next 24 hours.  Take frequent rest periods for the next 24 hours.  Walking will help expel (get rid of) the air and reduce the bloated feeling in your abdomen.  No driving for 24 hours (because of the anesthesia (medicine) used during the test).  You may shower.  Do not sign any important legal documents or operate any machinery for 24 hours (because of the anesthesia used during the test).  NUTRITION Drink plenty of fluids.  You may resume your normal diet.  Begin with a light meal and progress to your normal diet.  Avoid alcoholic beverages for 24 hours or as instructed by your caregiver.  MEDICATIONS You may resume your normal medications unless your caregiver tells you otherwise.  WHAT YOU CAN EXPECT TODAY You may experience abdominal discomfort such as a feeling of fullness or "gas" pains.  FOLLOW-UP Your doctor will discuss the results of your test with you.  SEEK IMMEDIATE MEDICAL ATTENTION IF ANY OF THE FOLLOWING OCCUR: Excessive nausea (feeling sick to your stomach) and/or vomiting.  Severe abdominal pain and distention (swelling).  Trouble swallowing.  Temperature over 101 F (37.8 C).  Rectal bleeding or vomiting of blood.    1)Please call Dupixent  My Way [phone]  to discuss cost and scheduling. 2)Pantoprazole  40mg  twice daily 3)Budesonide  slurry    I hope you have a great rest of your  week!   Darl Brisbin Faizan Urho Rio , M.D.. Gastroenterology and Hepatology [facility]

## 2024-08-17 NOTE — Telephone Encounter (Signed)
 PA attempted via cover my meds---There is an existing case within the [facility] environment that has the same patient, prescriber, and drug. This case must be finalized before proceeding with similar requests.  Will need to call Cover My Meds

## 2024-08-17 NOTE — Op Note (Signed)
 [facility] Patient Name: Ronald Mcdowell Procedure Date: 08/17/2024 6:58 AM MRN: [id number] Date of Birth: [birth year] Attending MD: Deatrice Dine , MD, [id number] CSN: [id number] Age: 78 Admit Type: Outpatient Procedure:                Upper GI endoscopy Indications:              Dysphagia, Follow-up of eosinophilic esophagitis Providers:                Deatrice Dine, MD, Leandrew Edelman RN, RN, Bascom Blush Referring MD:              Medicines:                Monitored Anesthesia Care Complications:            No immediate complications. Estimated Blood Loss:     Estimated blood loss was minimal. Procedure:                Pre-Anesthesia Assessment:                           - Prior to the procedure, a History and Physical                            was performed, and patient medications and                            allergies were reviewed. The patient's tolerance of                            previous anesthesia was also reviewed. The risks                            and benefits of the procedure and the sedation                            options and risks were discussed with the patient.                            All questions were answered, and informed consent                            was obtained. Prior Anticoagulants: The patient has                            taken no anticoagulant or antiplatelet agents                            except for aspirin . ASA Grade Assessment: II - A                            patient with mild systemic disease. After reviewing                            the risks and benefits, the patient was deemed in  satisfactory condition to undergo the procedure.                           After obtaining informed consent, the endoscope was                            passed under direct vision. Throughout the                            procedure, the patient's blood pressure, pulse, and                            oxygen  saturations were monitored continuously. The                            [id number]-[id number] ([id number]) Upper was introduced through                            the mouth, and advanced to the second part of                            duodenum. The upper GI endoscopy was accomplished                            without difficulty. The patient tolerated the                            procedure well. Scope In: 7:39:28 AM Scope Out: 8:00:29 AM Total Procedure Duration: 0 hours 21 minutes 1 second  Findings:      Mucosal changes including ringed esophagus, small-caliber esophagus,       white plaques and stenosis were found in the entire esophagus.       Esophageal findings were graded using the Eosinophilic Esophagitis       Endoscopic Reference Score (EoE-EREFS) as: Edema Grade 1 Present       (decreased clarity or absence of vascular markings), Rings Grade 2       Moderate (distinct rings that do not occlude passage of diagnostic 8-10       mm endoscope), Exudates Grade 2 Severe (scattered white lesions       involving 10 percent or greater of the esophageal surface area), Furrows       Grade 1 Mild (vertical lines without visible depth) and Stricture       present (5 mm luminal diameter). Biopsies were obtained from the       proximal and distal esophagus with cold forceps for histology of       suspected eosinophilic esophagitis. A guidewire was placed and the scope       was withdrawn. Dilation was performed with a Savary dilator with       moderate resistance at 6 mm, 7 mm and 8 mm. The dilation site was       examined following endoscope reinsertion and showed moderate mucosal       disruption.      Three benign-appearing, intrinsic severe (stenosis; an endoscope cannot       pass) stenoses were found at 20 cm from incisior (most prominent) ,  middle esophagus and lower esophagus. The narrowest stenosis measured 5       mm (inner diameter). The stenoses were traversed after downsizing scope        to ultraslim . A guidewire was placed and the scope was withdrawn.       Dilation was performed with a Savary dilator with moderate resistance at       6 mm, 7 mm and 8 mm. The dilation site was examined following endoscope       reinsertion and showed moderate mucosal disruption, moderate improvement       in luminal narrowing and no perforation.      Mildly erythematous mucosa without bleeding was found in the gastric       antrum.      The duodenal bulb and second portion of the duodenum were normal. Impression:               - Esophageal mucosal changes secondary to                            eosinophilic esophagitis. Dilated.                           - Benign-appearing esophageal stenoses. Dilated.                           - Erythematous mucosa in the antrum.                           - Normal duodenal bulb and second portion of the                            duodenum.                           - Biopsies were taken with a cold forceps for                            evaluation of eosinophilic esophagitis. Moderate Sedation:      Per Anesthesia Care      Per Anesthesia Care Recommendation:           - Patient has a contact number available for                            emergencies. The signs and symptoms of potential                            delayed complications were discussed with the                            patient. Return to normal activities tomorrow.                            Written discharge instructions were provided to the                            patient.                           -  Resume previous diet.                           - Continue present medications.                           - Await pathology results.                           - Repeat upper endoscopy in 4-6 weeks to evaluate                            the response to therapy and for surveillance based                            on pathology results , repeat dilation                           -  Return to GI clinic as previously scheduled.                           -Agressive EOE management : PPI BID and swallowed                            steroids, Dupixent  ( this was prescribed but                            patient never followed up with the company/pharmacy) Procedure Code(s):        --- Professional ---                           [phone], Esophagogastroduodenoscopy, flexible,                            transoral; with insertion of guide wire followed by                            passage of dilator(s) through esophagus over guide                            wire                           43239, 59, Esophagogastroduodenoscopy, flexible,                            transoral; with biopsy, single or multiple Diagnosis Code(s):        --- Professional ---                           K20.0, Eosinophilic esophagitis                           K22.2, Esophageal obstruction  K31.89, Other diseases of stomach and duodenum                           R13.10, Dysphagia, unspecified CPT copyright 2022 American Medical Association. All rights reserved. The codes documented in this report are preliminary and upon coder review may  be revised to meet current compliance requirements. Deatrice Dine, MD Deatrice Dine, MD 08/17/2024 8:28:34 AM This report has been signed electronically. Number of Addenda: 0

## 2024-08-17 NOTE — Transfer of Care (Signed)
 Immediate Anesthesia Transfer of Care Note  Patient: Ronald Mcdowell  Procedure(s) Performed: EGD (ESOPHAGOGASTRODUODENOSCOPY) DILATION, ESOPHAGUS  Patient Location: Endoscopy Unit  Anesthesia Type:General  Level of Consciousness: drowsy and patient cooperative  Airway & Oxygen Therapy: Patient Spontanous Breathing Face mask  Post-op Assessment: Report given to RN and Post -op Vital signs reviewed and stable  Post vital signs: Reviewed and stable  Last Vitals:  Vitals Value Taken Time  BP 90/35 08/17/24 08:05  Temp 36.5 C 08/17/24 08:05  Pulse 65 08/17/24 08:05  Resp 14 08/17/24 08:05  SpO2 100 % 08/17/24 08:05    Last Pain:  Vitals:   08/17/24 0805  TempSrc: Axillary  PainSc: 0-No pain      Patients Stated Pain Goal: 3 (08/17/24 0650)  Complications: No notable events documented.

## 2024-08-17 NOTE — Anesthesia Procedure Notes (Signed)
 Date/Time: 08/17/2024 7:35 AM  Performed by: Para Jerelene CROME, CRNAOxygen Delivery Method: Simple face mask Comments: POM Face Mask.

## 2024-08-17 NOTE — Telephone Encounter (Signed)
 Pt wife Vernell left voicemail asking for a call back Returned call to pt wife. Pt states she called [facility] about the Dupixent  and they need more information from the doctor to send to his insurance. Pt wife asked should she call [facility] and I advised her yes because back in August they stated they had the script and it was approved  06/23/24 Walterine Merle, spoke to [facility] and they are able to fill 30 day supplies (actually 28 days) using the existing approval/denial and the Rx you all sent. Pt needs to contact them directly to discuss cost and scheduling.    Pt wife states she will call [facility]. Advised her if they had questions or if she did they could call me back   PA received for [name]  2mg /37mL suspension from [facility].

## 2024-08-17 NOTE — Anesthesia Preprocedure Evaluation (Signed)
 Anesthesia Evaluation  Patient identified by MRN, date of birth, ID band Patient awake    Reviewed: Allergy & Precautions, H&P , NPO status , Patient's Chart, lab work & pertinent test results, reviewed documented beta blocker date and time   Airway Mallampati: II  TM Distance: >3 FB Neck ROM: full    Dental no notable dental hx.    Pulmonary neg pulmonary ROS, Current Smoker and Patient abstained from smoking.   Pulmonary exam normal breath sounds clear to auscultation       Cardiovascular Exercise Tolerance: Good hypertension, + angina  + CAD, + Past MI and +CHF  + dysrhythmias Atrial Fibrillation  Rhythm:regular Rate:Normal     Neuro/Psych  PSYCHIATRIC DISORDERS      TIA Neuromuscular disease negative neurological ROS  negative psych ROS   GI/Hepatic negative GI ROS, Neg liver ROS, hiatal hernia,GERD  ,,  Endo/Other  negative endocrine ROS Hyperthyroidism   Renal/GU Renal diseasenegative Renal ROS  negative genitourinary   Musculoskeletal   Abdominal   Peds  Hematology negative hematology ROS (+) Blood dyscrasia, anemia   Anesthesia Other Findings   Reproductive/Obstetrics negative OB ROS                              Anesthesia Physical Anesthesia Plan  ASA: 3  Anesthesia Plan: General   Post-op Pain Management:    Induction:   PONV Risk Score and Plan: Propofol  infusion  Airway Management Planned:   Additional Equipment:   Intra-op Plan:   Post-operative Plan:   Informed Consent: I have reviewed the patients History and Physical, chart, labs and discussed the procedure including the risks, benefits and alternatives for the proposed anesthesia with the patient or authorized representative who has indicated his/her understanding and acceptance.     Dental Advisory Given  Plan Discussed with: CRNA  Anesthesia Plan Comments:         Anesthesia Quick  Evaluation

## 2024-08-17 NOTE — H&P (Signed)
 Primary Care Physician:  Leavy Waddell NOVAK, FNP Primary Gastroenterologist:  Dr. Cinderella  Pre-Procedure History & Physical: HPI:  Ronald Mcdowell is a 78 y.o. male with CAD, HFrEF, NICM, A-fib, NSVT, hypertension, hyperlipidemia, CKD stage IV, and Graves' disease, s/p iodine thyroid  ablation, and recurrent UTIs, who presents for evaluation of dysphagia with history of eosinophilic esophagitis.   Patient reports that for past 4 years he has difficulty swallowing which has been worsening.  Reports difficulty mostly with solids but can also happen with liquids.  Denies any food impactions but is barely able to eat meat.  Patient has been taking Protonix  and omeprazole for years without relief  Patient havent picked up Dupixent  yet    Last EGD:2021   Cr? pe paper appearing esophageal mucosa diffusely with a ringed appearance. Multiple white tiny pustular areas scattered throughout the esophageal body consistent with eosinophilic abscesses. . More narrowed esophagus proximally. Was able to advance the adult scope through the esophagus with mild resistance. Couple of tiny tears resulted with this maneuver. Stomach normal. First and second portion of the duodenum appeared normal. Scope withdrawn, a 48 French Maloney dilator was passed to full insertion with mild to moderate resistance. Look back revealed multiple superficial tears. About 15 cc of blood loss with this maneuver. Subsequently, biopsies of mid and distal esophagus taken for histologic study Impression:    Abnormal esophagus consistent with EOE. Status post dilation and biopsy    A. ESOPHAGEAL, BIOPSY:  - Squamous mucosa with increased eosinophils consistent with  eosinophilic esophagitis.  - Greater than 30 per high-power field.    2019   Impression: Cervical/ proximal esophageal web dilated as described above. Markedly abnormal esophagus suspicious for EOE? biopsied. - Small hiatal hernia. - The examination was otherwise normal. -  Normal duodenal bulb and second portion of the duodenum. - Normal stomach. - No specimens collected.   Esophagus, biopsy - SQUAMOUS MUCOSA WITH INCREASED EOSINOPHILS CONSISTENT WITH EOSINOPHILIC ESOPHAGITIS. - GREATER THAN 30 PER HIGH POWER FIELD. - NO DYSPLASIA OR MALIGNANCY   Last Colonoscopy:2017   Normal ileocolonoscopy ( anal canal hemorrhoids)  Past Medical History:  Diagnosis Date   Anemia    Atrial fibrillation (HCC)    CHF (congestive heart failure) (HCC)    Chronic kidney disease, unspecified 01/14/2020   Coronary artery disease    a. h/o MI in 2000 w/ prior LCX stenting;  b. 7.2014 Abnl Cardiolite , EF 41%, mod-large inferolat scar;  c. 07/2013 Cath/PCI: LM nl, LAD 10-20, D1 40-50p, LCX 95-99 @ distal stent margin (2.5x20 Promus Premier DES), RCA dom, 40p, 47m, 70d, EF 35-40%.   Elevated cholesterol    GERD (gastroesophageal reflux disease)    Graves disease 04/2020   s/p I-131 thyroid  ablation    Hemorrhoids    Hypertension    Hyperthyroidism 09/27/2020   Ischemic cardiomyopathy    a. 07/2013 EF 35-40% by LV gram.; EF 50% on ECHO in November 2020   Myocardial infarction [facility])    Reflux esophagitis    Spondylosis of cervical joint    C3-4, C6-7   Tobacco user    Vitamin D  deficiency disease 08/24/2019    Past Surgical History:  Procedure Laterality Date   BIOPSY  01/09/2018   Procedure: BIOPSY;  Surgeon: Shaaron Lamar HERO, MD;  Location: [facility];  Service: Endoscopy;;  esophageal biopsies   CARDIAC CATHETERIZATION     CARDIOVERSION N/A 02/18/2022   Procedure: CARDIOVERSION;  Surgeon: Rolan Ezra RAMAN, MD;  Location: [facility] ENDOSCOPY;  Service: Cardiovascular;  Laterality: N/A;   COLONOSCOPY     COLONOSCOPY N/A 01/25/2016   RMR: normal ileocolonoscopy ( anal canal hemorrhoids)   CORONARY ANGIOPLASTY WITH STENT PLACEMENT  08/23/2013   mid circumflex  DES    by Dr Swaziland   CORONARY STENT PLACEMENT  2000   ESOPHAGOGASTRODUODENOSCOPY N/A 01/25/2016   RMR: Reflux  esophagitits. Cervical web with passage of the scope. Status post esophageal biosy. Hiatal hernia.    ESOPHAGOGASTRODUODENOSCOPY N/A 01/09/2018   Rourk: cervical/proximal esophageal web s/p dilation, markedly abnormal esophagus c/w biopsy proven eosinophilic esophagitis. small hiatal hernia.   ESOPHAGOGASTRODUODENOSCOPY N/A 05/31/2020   Procedure: ESOPHAGOGASTRODUODENOSCOPY (EGD);  Surgeon: Shaaron Lamar HERO, MD;  Location: [facility];  Service: Endoscopy;  Laterality: N/A;  8:30am   LEFT HEART CATHETERIZATION WITH CORONARY ANGIOGRAM N/A 08/23/2013   Procedure: LEFT HEART CATHETERIZATION WITH CORONARY ANGIOGRAM;  Surgeon: Peter M Swaziland, MD;  Location: [facility] CATH LAB;  Service: Cardiovascular;  Laterality: N/A;   MALONEY DILATION N/A 01/09/2018   Procedure: AGAPITO DILATION;  Surgeon: Shaaron Lamar HERO, MD;  Location: [facility];  Service: Endoscopy;  Laterality: N/A;   MALONEY DILATION N/A 05/31/2020   Procedure: AGAPITO DILATION;  Surgeon: Shaaron Lamar HERO, MD;  Location: [facility];  Service: Endoscopy;  Laterality: N/A;   RIGHT/LEFT HEART CATH AND CORONARY ANGIOGRAPHY N/A 01/16/2022   Procedure: RIGHT/LEFT HEART CATH AND CORONARY ANGIOGRAPHY;  Surgeon: Claudene Victory ORN, MD;  Location: [facility] INVASIVE CV LAB;  Service: Cardiovascular;  Laterality: N/A;   TEE WITHOUT CARDIOVERSION N/A 02/18/2022   Procedure: TRANSESOPHAGEAL ECHOCARDIOGRAM (TEE);  Surgeon: Rolan Ezra RAMAN, MD;  Location: [facility] ENDOSCOPY;  Service: Cardiovascular;  Laterality: N/A;   TRANSURETHRAL RESECTION OF PROSTATE  01/09/2012   Procedure: TRANSURETHRAL RESECTION OF THE PROSTATE (TURP);  Surgeon: Mohammad I Javaid, MD;  Location: [facility] ORS;  Service: Urology;  Laterality: N/A;    Prior to Admission medications   Medication Sig Start Date End Date Taking? Authorizing Provider  amiodarone  (PACERONE ) 200 MG tablet TAKE 1 TABLET BY MOUTH DAILY 02/16/24  Yes Branch, Dorn FALCON, MD  aspirin  EC 81 MG tablet Take 81 mg by mouth daily. Swallow whole.    Yes [provider]  atorvastatin  (LIPITOR ) 80 MG tablet TAKE 1 TABLET BY MOUTH DAILY 02/16/24  Yes Branch, Dorn FALCON, MD  Budesonide  2 MG/10ML SUSP Take 2 mg by mouth in the morning and at bedtime. FDA approved EOE treatment : Budesonide  oral suspension is 2 mg, twice daily, for 12 weeks . Take budesonide  slowly, over 5 to 10 minutes, and not to eat or drink for 30 minutes after taking the medication. Administered as an oral viscous slurry (4 mg total daily dose, generally divided into twice a day). Viscous budesonide  can be compounded by mixing Pulmicort  Respules with sucralose (Splenda; 10 1-gram packets per 1 mg of budesonide , creating a volume of approximately 8 mL) 06/22/24 12/19/24 Yes Johsua Shevlin, Deatrice FALCON, MD  Cholecalciferol  (VITAMIN D -3) 125 MCG (5000 UT) TABS Take 1 tablet by mouth once a week.   Yes [provider]  digoxin  (LANOXIN ) 0.125 MG tablet Take 0.5 tablets (0.0625 mg total) by mouth every other day. 06/30/24  Yes Milford, Harlene HERO, FNP  dupilumab  (DUPIXENT ) 300 MG/2ML prefilled syringe Inject 300 mg into the skin once a week. 07/07/24 01/03/25 Yes Caro Brundidge, Deatrice FALCON, MD  hydrALAZINE  (APRESOLINE ) 50 MG tablet Take 1.5 tablets (75 mg total) by mouth 3 (three) times daily. 04/02/23  Yes Hayes Beckey CROME, NP  isosorbide   mononitrate (IMDUR ) 60 MG 24 hr tablet Take 1 tablet (60 mg total) by mouth daily. 06/07/24  Yes Branch, Dorn FALCON, MD  pantoprazole  (PROTONIX ) 40 MG tablet Take 1 tablet (40 mg total) by mouth 2 (two) times daily. 06/22/24 12/19/24 Yes Shantai Tiedeman, Deatrice FALCON, MD  potassium chloride  (KLOR-CON ) 10 MEQ tablet TAKE 1 TABLET BY MOUTH ONCE DAILY 05/17/24  Yes McLean, Dalton S, MD  spironolactone  (ALDACTONE ) 25 MG tablet Take 0.5 tablets (12.5 mg total) by mouth daily. 05/02/22  Yes Milford, Harlene HERO, FNP  nitroGLYCERIN  (NITROSTAT ) 0.4 MG SL tablet Place 1 tablet (0.4 mg total) under the tongue every 5 (five) minutes as needed for chest pain. 12/03/22   Lelon Hamilton T, PA-C     Allergies as of 07/02/2024 - Review Complete 06/22/2024  Allergen Reaction Noted   Peanut butter flavoring agent (non-screening) Swelling 01/10/2022    Family History  Problem Relation Age of Onset   Early death Mother        childbirth   Heart attack Father    Cancer Father    Heart disease Brother        CABG   Heart disease Brother        slight heart attack   Anesthesia problems Neg Hx    Hypotension Neg Hx    Malignant hyperthermia Neg Hx    Pseudochol deficiency Neg Hx    Colon cancer Neg Hx    Liver disease Neg Hx    Gastric cancer Neg Hx    Esophageal cancer Neg Hx     Social History   Socioeconomic History   Marital status: Married    Spouse name: Vernell   Number of children: Not on file   Years of education: 11th grade   Highest education level: 11th grade  Occupational History   Occupation: Merchandiser, retail  Tobacco Use   Smoking status: Every Day    Current packs/day: 0.50    Average packs/day: 0.5 packs/day for 58.7 years (29.3 ttl pk-yrs)    Types: Cigarettes    Start date: 12/09/1965   Smokeless tobacco: Never   Tobacco comments:    3/4 pack of cigarettes daily; smoked since 78 years old  Vaping Use   Vaping status: Never Used  Substance and Sexual Activity   Alcohol  use: No    Alcohol /week: 0.0 standard drinks of alcohol     Comment: No bowel since year 2000.  Some previous heavy use.   Drug use: No   Sexual activity: Yes    Birth control/protection: None  Other Topics Concern   Not on file  Social History Narrative   Pt gets regular exercise.Married for 22 years.Retired but still Engineering geologist.   Social Drivers of Corporate investment banker Strain: Low Risk  (01/23/2022)   Overall Financial Resource Strain (CARDIA)    Difficulty of Paying Living Expenses: Not very hard  Recent Concern: Financial Resource Strain - Medium Risk (01/10/2022)   Received from [facility]   Overall Financial Resource Strain (CARDIA)     Difficulty of Paying Living Expenses: Somewhat hard  Food Insecurity: No Food Insecurity (01/23/2022)   Hunger Vital Sign    Worried About Running Out of Food in the Last Year: Never true    Ran Out of Food in the Last Year: Never true  Transportation Needs: No Transportation Needs (01/23/2022)   PRAPARE - Administrator, Civil Service (Medical): No    Lack of Transportation (Non-Medical): No  Physical Activity:  Sufficiently Active (08/23/2019)   Exercise Vital Sign    Days of Exercise per Week: 5 days    Minutes of Exercise per Session: 30 min  Stress: No Stress Concern Present (08/23/2019)   [employer] of Occupational Health - Occupational Stress Questionnaire    Feeling of Stress : Not at all  Social Connections: Not on file  Intimate Partner Violence: Not on file    Review of Systems: See HPI, otherwise negative ROS  Physical Exam: Vital signs in last 24 hours: Temp:  [98.2 F (36.8 C)] 98.2 F (36.8 C) (09/23 0650) Pulse Rate:  [47] 47 (09/23 0650) Resp:  [18] 18 (09/23 0650) BP: (179)/(74) 179/74 (09/23 0650) SpO2:  [99 %] 99 % (09/23 0650) Weight:  [66.7 kg] 66.7 kg (09/23 0650)   General:   Alert,  Well-developed, well-nourished, pleasant and cooperative in NAD Head:  Normocephalic and atraumatic. Eyes:  Sclera clear, no icterus.   Conjunctiva pink. Ears:  Normal auditory acuity. Nose:  No deformity, discharge,  or lesions. Msk:  Symmetrical without gross deformities. Normal posture. Extremities:  Without clubbing or edema. Neurologic:  Alert and  oriented x4;  grossly normal neurologically. Skin:  Intact without significant lesions or rashes. Psych:  Alert and cooperative. Normal mood and affect.  Impression/Plan: Ronald Mcdowell is a 78 y.o. male with CAD, HFrEF, NICM, A-fib, NSVT, hypertension, hyperlipidemia, CKD stage IV, and Graves' disease, s/p iodine thyroid  ablation, and recurrent UTIs, who presents for evaluation of dysphagia with history of  eosinophilic esophagitis.   EGD with dilation /biopsies  Discussed higher risk of perforation and bleeding in patient with EOE  The risks of the procedure including infection, bleed, or perforation as well as benefits, limitations, alternatives and imponderables have been reviewed with the patient. Questions have been answered. All parties agreeable.

## 2024-08-17 NOTE — Progress Notes (Signed)
 Medication Samples have been provided to the patient.  Drug name: [name]        Strength: 2mg /73ml        Qty: 2  LOT: [id number]  Exp.Date: 07/25/2026  Dosing instructions: Take 2mg  twice daily  The patient has been instructed regarding the correct time, dose, and frequency of taking this medication, including desired effects and most common side effects.   Madelin CHRISTELLA Boom 11:35 AM 08/17/2024

## 2024-08-17 NOTE — Telephone Encounter (Signed)
 Wife called back and said that [facility] stated the cost for Dupixent  would be $1,712.79. [name] Location has samples of [name]  and we will send pt there to pick up samples. Will work on PA for [name]  and call Dupixent  [facility]

## 2024-08-18 ENCOUNTER — Ambulatory Visit (INDEPENDENT_AMBULATORY_CARE_PROVIDER_SITE_OTHER): Payer: Self-pay | Admitting: Gastroenterology

## 2024-08-18 ENCOUNTER — Encounter (HOSPITAL_COMMUNITY): Payer: Self-pay | Admitting: Gastroenterology

## 2024-08-18 LAB — SURGICAL PATHOLOGY

## 2024-08-18 NOTE — Telephone Encounter (Signed)
 Spoke with Ronald Mcdowell at [place] with the grievance and appeal department. Standard appeal started and will receive a decision in 7 days. Will need to fax over recent office notes and EGD report.   Fax# [phone] Reference # [id number] Appeal/Grievance Phone Number [phone]

## 2024-08-18 NOTE — Telephone Encounter (Signed)
 Selinda Glatter called and states pt has to be enrolled in Medication Assistance Program through his insurance. Pt also has to have NOT met his out of pocket max. Pt will need to state yes to Medication Assistance Program and No to meeting out of pocket max.  Attempted to reach pt wife to let her know but had to leave message

## 2024-08-18 NOTE — Telephone Encounter (Signed)
 Pt wife returned call. Informed wife to enroll patient in Medication Assistance program through insurance. Pt wife is not sure if he has met out of pocket max. Wife verbalized understanding

## 2024-08-18 NOTE — Telephone Encounter (Signed)
 Contacted Dupixent  my way and they state there is some missing information. Went through assistance form and the only portion that was checked by us  was the dose. Rep states that we would need to resend enrollment form, denial and an appeal would need to be done. Contacted [employer] and was swichted to [employer], spoke with Sherrilyn who states Dupixent  was approved for 2 syringes for 28 day supply but copay is $1712.79. [facility] stated they had been trying to reach pt for delivery but unsuccessful. Reached out to our representative Dajuan and had asked me to reach out to [place] due to him driving. Reached out to [place] but went straight to voicemail. Heather called back and states Dupixent  was approved but since the copay is to high, the pt would need to call Dupixent  at 1-800-DUPIXENT  and choose option 1 and tell them the co pay is to high. Powell also stated that we could reach out to [employer] who helps with reimbursement. Contacted pt wife Vernell and had to leave voicemail to return call. Pt wife return call and I gave her the information to call Dupixent . Will also sent Selinda an email.   Currently still working on [name]  PA

## 2024-08-19 ENCOUNTER — Ambulatory Visit: Attending: Nurse Practitioner | Admitting: Nurse Practitioner

## 2024-08-19 ENCOUNTER — Encounter: Payer: Self-pay | Admitting: Nurse Practitioner

## 2024-08-19 VITALS — BP 142/78 | HR 61 | Ht 65.0 in | Wt 138.0 lb

## 2024-08-19 DIAGNOSIS — I5022 Chronic systolic (congestive) heart failure: Secondary | ICD-10-CM | POA: Diagnosis not present

## 2024-08-19 DIAGNOSIS — I10 Essential (primary) hypertension: Secondary | ICD-10-CM

## 2024-08-19 DIAGNOSIS — I4891 Unspecified atrial fibrillation: Secondary | ICD-10-CM

## 2024-08-19 DIAGNOSIS — I350 Nonrheumatic aortic (valve) stenosis: Secondary | ICD-10-CM

## 2024-08-19 DIAGNOSIS — I4729 Other ventricular tachycardia: Secondary | ICD-10-CM

## 2024-08-19 DIAGNOSIS — I428 Other cardiomyopathies: Secondary | ICD-10-CM

## 2024-08-19 DIAGNOSIS — I2510 Atherosclerotic heart disease of native coronary artery without angina pectoris: Secondary | ICD-10-CM | POA: Diagnosis not present

## 2024-08-19 DIAGNOSIS — E785 Hyperlipidemia, unspecified: Secondary | ICD-10-CM

## 2024-08-19 DIAGNOSIS — I6523 Occlusion and stenosis of bilateral carotid arteries: Secondary | ICD-10-CM | POA: Diagnosis not present

## 2024-08-19 DIAGNOSIS — N184 Chronic kidney disease, stage 4 (severe): Secondary | ICD-10-CM

## 2024-08-19 NOTE — Progress Notes (Signed)
 Cardiology Office Note:  .   Date: 08/19/2024 ID:  Ronald Mcdowell, DOB 1946/05/16, MRN [id number] PCP: Leavy Waddell NOVAK, FNP  [facility] Providers Cardiologist:  Alvan Carrier, MD Electrophysiologist:  OLE ONEIDA HOLTS, MD  Advanced Heart Failure:  Ezra Shuck, MD    History of Present Illness: .   Ronald Mcdowell is a very pleasant 78 y.o. male with a past medical history of CAD, HFrEF, NICM, A-fib, NSVT, hypertension, hyperlipidemia, CKD stage IV, and Graves' disease, s/p iodine thyroid  ablation, and recurrent UTIs, who presents today for 3 month follow-up appointment.  Followed by electrophysiology and heart failure clinic.  Previous cardiovascular history of stenting to left circumflex in 2014, as well as previous PCI in 2000, in February 2023 he underwent cardiac catheterization-see report below.  Last seen by Dr. Carrier Alvan on December 09, 2022.  He was overall doing well at the time.  It was noted he did have some chronic dizziness, denied any specific positional symptoms.  Orthostatics were negative in office that day.  In the interim, he has had several ED visits last year.  More recently was seen at [facility] ED in October 2024 for atypical chest pain.  He noted lightheadedness and intermittent paresthesias of right arm, denied any weakness.  Noted to have chest pain earlier that day, pain had resolved upon presentation to ED.  Workup was reassuring.  CT of the head was negative for anything acute, did show mild chronic small vessel ischemic changes within the cerebral white matter.  Was given Rx for meclizine .  More recently, seen at [facility] ED on December 04, 2023 for presyncopal episode.  Patient noted significant dizziness with feelings of near syncope that morning and bradycardia noted in ED.  Was noted to be bradycardic.  Heart rate in 40s when he arrived.  14-day ZIO monitor was arranged - see report below.  01/15/2024 - Today he presents for  scheduled 17-month follow-up appointment. He notices episodes of chest pain where he describes it as a tightness along left upper side of his chest, causes him to leave work early to go to the emergency department.  This has been chronic and stable for a while.  He says his past workup has been reassuring.  He also admits to left arm numbness/stinging sensation, particularly noticed and made worse when laying on his left side. Denies any shortness of breath, palpitations, syncope, presyncope, dizziness, orthopnea, PND, swelling or significant weight changes, acute bleeding, or claudication.  Seen by EP NP on 01/28/2024, was doing well at the time.   03/22/2024 - Presents today for follow-up.  He tells me he is doing better since I last saw him in the office.  Continues to note intermittent chest pain, has improved overall per his report.  Says this happens usually early in the morning.  Says he will hit his chest (not hard per his report), and it goes away.  Says this is brief in duration. Says his arms/shoulders will feel sore at times. Denies any active chest pain. Compliant with his medications and tolerating well. Denies any shortness of breath, palpitations, syncope, presyncope, dizziness, orthopnea, PND, swelling or significant weight changes, acute bleeding, or claudication. Does have a BP cuff at home but does not check his BP.   08/19/2024 - Here for follow-up. Says he he got his esophagus stretched recently, throat is a little sore. Doing well from a cardiac perspective. Denies any chest pain, shortness of breath, palpitations, syncope, presyncope, dizziness,  orthopnea, PND, swelling or significant weight changes, acute bleeding, or claudication. SBP around 140's at home per his report.   ROS: Negative.  See HPI. SH: Works at [employer] Reviewed: SABRA       EKG: EKG is not ordered today.   Carotid duplex 01/2024:  Summary:  Right Carotid: Velocities in the right ICA are consistent with a  1-39%  stenosis.                Non-hemodynamically significant plaque <50% noted in the  CCA. The                 ECA appears <50% stenosed.   Left Carotid: Velocities in the left ICA are consistent with a 1-39%  stenosis.               Non-hemodynamically significant plaque <50% noted in the  CCA. The                ECA appears <50% stenosed.   Vertebrals:  Bilateral vertebral arteries demonstrate antegrade flow.  Subclavians: Normal flow hemodynamics were seen in bilateral subclavian               arteries.   *See table(s) above for measurements and observations.    Echo 01/2024:    1. Left ventricular ejection fraction, by estimation, is 45 to 50%. Left  ventricular ejection fraction by 3D volume is 48 %. The left ventricle has  mildly decreased function. The left ventricle demonstrates regional wall  motion abnormalities (see  scoring diagram/findings for description). Left ventricular diastolic  parameters are consistent with Grade I diastolic dysfunction (impaired  relaxation). Elevated left ventricular end-diastolic pressure. The average  left ventricular global longitudinal  strain is -17.5 %. The global longitudinal strain is normal.   2. Right ventricular systolic function is normal. The right ventricular  size is normal. Tricuspid regurgitation signal is inadequate for assessing  PA pressure.   3. Left atrial size was mild to moderately dilated.   4. The mitral valve is normal in structure. Trivial mitral valve  regurgitation. No evidence of mitral stenosis.   5. The aortic valve is tricuspid. There is mild calcification of the  aortic valve. Aortic valve regurgitation is not visualized. Mild aortic  valve stenosis. Aortic valve area, by VTI measures 1.67 cm. Aortic valve  mean gradient measures 8.0 mmHg.  Aortic valve Vmax measures 2.07 m/s.   6. The inferior vena cava is normal in size with greater than 50%  respiratory variability, suggesting right atrial  pressure of 3 mmHg.   Comparison(s): A prior study was performed on 07/30/2022. Changes from prior  study are noted. LVEF improved from 35-40% to 45-50% with RWMA. Mild AS is  new.   Lexiscan  12/2023:      Findings are consistent with infarction. The study is intermediate risk.   No ST deviation was noted. The ECG was negative for ischemia.   LV perfusion is abnormal.  Large, severe intensity, apical to basal inferolateral defect that is fixed and consistent with infarct scar.  No significant ischemia.   Left ventricular function is abnormal. Nuclear stress EF: 36%. End diastolic cavity size is moderately enlarged. End systolic cavity size is moderately enlarged.   Overall intermediate risk study with evidence of inferolateral infarct scar but no substantial ischemia.  LV dilated with LVEF 36%.  Cardiac monitor 12/23/2023: Conclusions:  - Ambulatory ECG monitoring was performed from 12/04/23 to 12/17/23, with  ~13 days of analyzable  data.  -  The predominant rhythm was sinus rhythm, with sinus rate ranging from 46 to 113 and averaging 62 bpm.  -  First Degree AV Block was present. Intermittent Bundle Branch Block was present.  -  Rare PACs were recorded with no recorded episodes of SVT.  -  Rare PVCs were recorded.  -  3 episodes of asymptomatic wide complex tachycardia occurred, the run with the fastest interval lasting 7 beats with a max rate of 162 bpm and the longest lasting 6 beats with an avg rate of 117 bpm.  -  No pauses >3 seconds or high-grade AV block detected.  -  No atrial fibrillation detected.  -  Patient-initiated recordings/events correlated with sinus rhythm.   Lexiscan  11/2022:   Findings are consistent with infarction. The study is intermediate risk.   No ST deviation was noted.   LV perfusion is abnormal. There is no evidence of ischemia. There is evidence of infarction. Defect 1: There is a medium defect with moderate reduction in uptake present in the mid to basal  inferolateral location(s) that is fixed. There is abnormal wall motion in the defect area. Consistent with infarction.   Left ventricular function is abnormal. Global function is moderately reduced. End diastolic cavity size is moderately enlarged.   Prior study available for comparison from 06/10/2013. There are changes compared to prior study. The left ventricular ejection fraction has decreased.   Intermediate risk nuclear perfusion study.   There is an old inferolateral infarction and left ventricular systolic function is moderately depressed.  There is no evidence of reversible ischemia.  Perfusion pattern is similar to the previous study, but the left ventricular systolic function is lower.  Echocardiogram 07/2022:  1. Left ventricular ejection fraction, by estimation, is 35 to 40%. The  left ventricle has moderately decreased function. The left ventricle  demonstrates regional wall motion abnormalities with basal inferior  akinesis, basal inferolateral akinesis, and  basal to mid inferolateral akinesis. There is mild concentric left  ventricular hypertrophy. Left ventricular diastolic parameters are  consistent with Grade I diastolic dysfunction (impaired relaxation).   2. Right ventricular systolic function is normal. The right ventricular  size is normal. Tricuspid regurgitation signal is inadequate for assessing  PA pressure.   3. Left atrial size was mildly dilated.   4. The mitral valve is normal in structure. No evidence of mitral valve  regurgitation. No evidence of mitral stenosis.   5. The aortic valve is tricuspid. There is mild calcification of the  aortic valve. Aortic valve regurgitation is not visualized. No aortic  stenosis is present.   6. The inferior vena cava is normal in size with greater than 50%  respiratory variability, suggesting right atrial pressure of 3 mmHg.  Cardiac MRI 01/2022: IMPRESSION: 1.  Severely dilated LV with diffuse hypokinesis, EF 24%.   2.   Normal RV size with mildly decreased systolic function, EF 45%.   3. Delayed enhancement images show coronary-pattern LGE in a circumflex coronary artery distribution, suspect prior MI involving the LCx.   I do not think that the amount of scarring from prior MI explains the extent of his cardiomyopathy. No evidence, however, for myocarditis or infiltrative disease by LGE.   Right and left heart cath 12/2021: CONCLUSIONS: Luminal irregularities in the left main Luminal irregularities with up to 40% eccentric mid LAD Widely patent mid circumflex stent.  Diffuse disease in branches in the still second obtuse marginal. Dominant right coronary, mid eccentric 70% stenosis, distal  65% stenosis. Normal LVEDP Normal capillary wedge pressure Mixed venous O2 saturation 72%   RECOMMENDATIONS: Systolic dysfunction is out of proportion to the degree of coronary disease. Guideline directed therapy for systolic heart failure. Resume apixaban  in 8 hours.       Physical Exam:   VS:  BP (!) 142/78   Pulse 61   Ht 5' 5 (1.651 m)   Wt 138 lb (62.6 kg)   SpO2 99%   BMI 22.96 kg/m    Wt Readings from Last 3 Encounters:  08/19/24 138 lb (62.6 kg)  08/17/24 147 lb (66.7 kg)  06/22/24 137 lb (62.1 kg)    GEN: Well nourished, well developed in no acute distress NECK: No JVD; right carotid bruit noted on exam, no left carotid bruit CARDIAC: S1/S2, RRR, no murmurs, rubs, gallops RESPIRATORY:  Clear to auscultation without rales, wheezing or rhonchi  ABDOMEN: Soft, non-tender, non-distended EXTREMITIES:  No edema; No deformity   ASSESSMENT AND PLAN: .    1.  CAD Stable with no anginal symptoms. No indication for ischemic evaluation.  Continue aspirin , atorvastatin , Imdur , and nitroglycerin  as needed.  Improved EF on most recent echocardiogram, still mildly reduced EF.  Heart healthy diet and regular cardiovascular exercise as tolerated encouraged.  Care and ED precautions discussed.  Agree with  Dr. Alvan that there is a very high threshold to repeat cardiac catheterization due to his CKD stage IV.    2.  Chronic systolic CHF, HFmrEF, NICM Stage C, NYHA class I symptoms.  EF in 2023 was found to be 35 to 40%.  Most recent echocardiogram in March 2025 showed EF 45 to 50%.  He has been closely followed by heart failure clinic in the past.  Euvolemic and well compensated on exam. Continue current medication regimen. Low sodium diet, fluid restriction <2L, and daily weights encouraged. Educated to contact our office for weight gain of 2 lbs overnight or 5 lbs in one week.    3. Mild aortic valve stenosis Most recent echocardiogram revealed no evidence of mild aortic valve stenosis with mean gradient measuring 8.0 mmHg.  He is asymptomatic with this.  Will continue to monitor and recommend updating echocardiogram in 1 year or sooner if clinically indicated.  No medication changes at this time besides what is noted above. Care and ED precautions discussed.  4.  A-fib, NSVT  Denies any tachycardia or palpitations. He was taking Eliquis  in the past as noted by EP APP's recent note.  Chart reviewed and patient was not eligible for approval for Eliquis  assistance, he confirmed today that he is no longer taking Eliquis  due to affordability issues.  Coumadin was reviewed with him as well as stroke risk, and patient was not interested in taking due to monitoring and dietary restrictions. Currently on baby aspirin . Previously consulted Dr. Cindie for further review of past EKG from previous office visit and stated okay for patient to continue amiodarone  200 mg daily.  No medication changes at this time besides what is mentioned above. Continue follow-up with EP.   5.  Hypertension Blood pressure elevated today. Discussed to monitor BP at home at least 2 hours after medications and sitting for 5-10 minutes.  Given BP log and discussed to monitor this and bring log to next visit. Discussed when to notify our  office. GDMT limited due to his CKD.  Continue hydralazine , Imdur , and spironolactone . Heart healthy diet and regular cardiovascular exercise encouraged.  If no improvement in BP by next BP check, plan  to increase Imdur  dosage.  6.  Hyperlipidemia LDL 56 03/2024. LDL goal < 70. Continue current medication regimen.  Heart healthy diet and regular cardiovascular exercise encouraged.   7.  Carotid artery stenosis Most recent carotid Doppler revealed bilateral ICA stenosis at 1 to 39%. Continue current medication regimen.  Heart healthy diet and regular cardiovascular exercise encouraged.   8.  CKD stage IV Most recent serum creatinine was 1.94 with eGFR of 35. He is being followed by nephrology.  Avoid nephrotoxic agents.  Continue follow-up PCP and nephrology.  Dispo: Follow-up with MD/APP in 6 months or sooner if anything changes.  Signed, Almarie Crate, NP

## 2024-08-19 NOTE — Patient Instructions (Addendum)
Medication Instructions:  Your physician recommends that you continue on your current medications as directed. Please refer to the Current Medication list given to you today.  Labwork: None  Testing/Procedures: None  Follow-Up: Your physician recommends that you schedule a follow-up appointment in: 6 Months with Dr.Branch  Any Other Special Instructions Will Be Listed Below (If Applicable).  If you need a refill on your cardiac medications before your next appointment, please call your pharmacy.

## 2024-08-19 NOTE — Telephone Encounter (Signed)
 Pt wife left message returning call. Wife states she spoke with wife and he is not able to afford the medication. Pt and pts wife called Medicare together and they are going to be sending out a paper to try for help with medication. Pt wife states Medicare states it would take about 3 weeks to get.

## 2024-08-19 NOTE — Telephone Encounter (Signed)
 Received fax from [facility] stating [name]  2 mg/10 ml stick pack has been approved after appeal request on 08/18/24. Approval good from 11/26/23-11/24/24. Contacted [facility] to ask about co pay and it will be a $300.00 copay. Summer at [facility] asked if I would like them to go ahead and fill it; I advised her that I would need to speak with the patient first. Contacted pt wife Vernell and advised her about co pay. Wife stated patient is at the heart doctor and will be home later. Wife will call me back after she speaks with patient.

## 2024-08-19 NOTE — Anesthesia Postprocedure Evaluation (Signed)
 Anesthesia Post Note  Patient: JAMAURI KRUZEL  Procedure(s) Performed: EGD (ESOPHAGOGASTRODUODENOSCOPY) DILATION, ESOPHAGUS  Patient location during evaluation: Phase II Anesthesia Type: General Level of consciousness: awake Pain management: pain level controlled Vital Signs Assessment: post-procedure vital signs reviewed and stable Respiratory status: spontaneous breathing and respiratory function stable Cardiovascular status: blood pressure returned to baseline and stable Postop Assessment: no headache and no apparent nausea or vomiting Anesthetic complications: no Comments: Late entry   No notable events documented.   Last Vitals:  Vitals:   08/17/24 0815 08/17/24 0824  BP: 107/72 127/79  Pulse: 65 65  Resp: 18 (!) 21  Temp:    SpO2: 98% 97%    Last Pain:  Vitals:   08/17/24 0805  TempSrc: Axillary  PainSc: 0-No pain                 Yvonna JINNY Bosworth

## 2024-08-20 MED ORDER — BUDESONIDE 2 MG/10ML PO SUSP: 2.0000 mg | Freq: Two times a day (BID) | ORAL | 2 refills | Status: DC

## 2024-08-20 NOTE — Addendum Note (Signed)
 Addended by: CINDERELLA DEATRICE SMILES on: 08/20/2024 11:01 AM   Modules accepted: Orders

## 2024-08-20 NOTE — Telephone Encounter (Signed)
 Until [name]  and Dupilumab  is figured out interms of coverage and affordability , they can get more samples from the office of [name]  .  I am also prescribing Budesonide  2 MG/10ML SUSPENSION , which they can use it as directed after making it a slurry. Education officer, museum)

## 2024-08-23 NOTE — Telephone Encounter (Signed)
 Spoke with Tammy at [facility] office and they only have 2 boxes of [name]  left

## 2024-08-24 NOTE — Progress Notes (Signed)
 8 wk EGD noted in recall Patient result letter mailed procedure note and pathology result faxed to PCP

## 2024-08-24 NOTE — Progress Notes (Signed)
 Yes , thank you for catching that   Make it 8 weeks , since they haven't started proper treatment yet

## 2024-09-03 ENCOUNTER — Other Ambulatory Visit (HOSPITAL_COMMUNITY): Payer: Self-pay | Admitting: Cardiology

## 2024-09-08 ENCOUNTER — Telehealth (INDEPENDENT_AMBULATORY_CARE_PROVIDER_SITE_OTHER): Payer: Self-pay | Admitting: Gastroenterology

## 2024-09-08 NOTE — Telephone Encounter (Signed)
 Pt wife contacted. Pt is taking Protonix  BID. Tolerating liquids well. Pt wife states they were not aware of budesonide . Advised wife to call [facility] and see if they can refill it. Pt wife verbalized understanding

## 2024-09-08 NOTE — Telephone Encounter (Signed)
 Pt wife left voicemail asking for a call back. Wife state the budesonide  is not covered by insurance. Returned call to wife and wife states she called the pharmacy and insurance isnt going to cover budesonide . Advised wife I would call [facility] and she what is going on. Contacted [facility] and spoke with Costilla. Alan spoke with Jeralyn and Jeralyn states they can compound it but is unsure if insurance will cover it. Alan states once they figure out a cash price they will call the patient and discuss pricing. Alan also states she will keep me informed of what is going on.

## 2024-09-08 NOTE — Telephone Encounter (Signed)
 Pt wife Vernell left message stating pt had throat stretched but is not having issues. Returned call to wife. Wife states patient told her this morning to call us  because he is having issues with getting choked when taking his pills and eating. Pt had EGD +/-ED on 08/17/24. Dr.Ahmed did prescribe [name]  and Dupixent  and Budesonide  Slurry. Patient and pt wife had to fill out papers for patient assistance with [name]  and Dupixent , sent them in on Friday and are now waiting on an answer. Advised pt wife that provider is out of the office this week but I will send it to a provider to see if any recommendations. Please advise. Thank you!

## 2024-09-08 NOTE — Telephone Encounter (Signed)
 As far as I know, [name]  is still in process as far as the forms go. Last time I checked [name] only had 2 boxes left

## 2024-09-09 ENCOUNTER — Other Ambulatory Visit: Payer: Self-pay

## 2024-09-09 MED ORDER — EOHILIA 2 MG/10ML PO SUSP: 2.0000 mg | Freq: Two times a day (BID) | ORAL | Status: AC

## 2024-09-09 NOTE — Telephone Encounter (Signed)
 Contacted pt wife and wife states he is out the samples. [name] [address] has 2 boxes. I have asked it we can get a sample. Once I receive an answer from [name] I will  call wife back and/or send message to provider.

## 2024-09-09 NOTE — Progress Notes (Signed)
 Medication Samples have been provided to the patient.  Drug name: [name]        Strength: 2 mg/ 10 mL        Qty: 1  LOT: [id number]  Exp.Date: 07/25/2026  Dosing instructions: 2 mg twice daily  The patient has been instructed regarding the correct time, dose, and frequency of taking this medication, including desired effects and most common side effects.   Ronald Mcdowell 9:59 AM 09/09/2024

## 2024-09-09 NOTE — Telephone Encounter (Signed)
 [name] [address] is willing to give one box to patient. Informed wife and wife states she will be by in the morning to pick it up.

## 2024-09-13 NOTE — Telephone Encounter (Signed)
 Alan from [facility] left voicemail stating that she has not heard back from the patient in regards to the compound price and to make sure they were ok with it. Compound will be about $80.

## 2024-09-20 ENCOUNTER — Encounter (INDEPENDENT_AMBULATORY_CARE_PROVIDER_SITE_OTHER): Payer: Self-pay | Admitting: Gastroenterology

## 2024-09-20 ENCOUNTER — Ambulatory Visit (INDEPENDENT_AMBULATORY_CARE_PROVIDER_SITE_OTHER): Admitting: Gastroenterology

## 2024-09-20 VITALS — BP 153/76 | HR 54 | Temp 97.5°F | Ht 65.0 in | Wt 140.0 lb

## 2024-09-20 DIAGNOSIS — K200 Eosinophilic esophagitis: Secondary | ICD-10-CM | POA: Diagnosis not present

## 2024-09-20 NOTE — Patient Instructions (Signed)
 Continue protonix  40mg  twice daily Continue the budesonide  slurry, you can pick up the prescription at [facility] We will await to hear back regarding dupixent  I am providing the elimination diet which can also be helpful We will discuss repeat upper endoscopy at follow up visit  Follow up 1 month  It was a pleasure to see you today. I want to create trusting relationships with patients and provide genuine, compassionate, and quality care. I truly value your feedback! please be on the lookout for a survey regarding your visit with me today. I appreciate your input about our visit and your time in completing this!    Rio Kidane L. Kaylynne Andres, MSN, APRN, AGNP-C Adult-Gerontology Nurse Practitioner [facility] at [facility]

## 2024-09-20 NOTE — Progress Notes (Signed)
 Referring Provider: Leavy Waddell NOVAK, FNP Primary Care Physician:  Leavy Waddell NOVAK, FNP Primary GI Physician: Dr. Cinderella   Chief Complaint  Patient presents with   Follow-up    Pt arrives for follow up. Pt has not received Dupixent  and has not heard anything from medicare about help with meds. Pt does have 4 packets left of [name] .    HPI:   Ronald Mcdowell is a 78 y.o. male with past medical history of CAD, HFrEF, NICM, A-fib, NSVT, hypertension, hyperlipidemia, CKD stage IV, and Graves' disease, s/p iodine thyroid  ablation, and recurrent UTIs   Patient presenting today for:  Follow up of EOE with dysphagia   Last seen in July, at that reported ongoing dysphagia x4 years, taking protonix  and omeprazole without improvement  EGD in September with EOE Sent dupixent , Protonix  and budesonide  slurry  Present:  States he is feeling much better doing eohilia  slurry. Dysphagia has improved and he is able to eat without choking. Taking protonix  BID. No heartburn or acid regurgitation. Has not heard anything regarding dupixent  patient assistance yet, this was done around 10/15, and states they were told it could be a month to month and a half before they heard anything back. He states he has not heard anything from [facility] regarding budesonide  slurry either. Has not tried elimination diet in the past    Last EGD: 07/2024:- Esophageal mucosal changes secondary to                            eosinophilic esophagitis. Dilated.                           - Benign-appearing esophageal stenoses. Dilated.                           - Erythematous mucosa in the antrum.                           - Normal duodenal bulb and second portion of the                            duodenum.                           - Biopsies were taken with a cold forceps for                            evaluation of eosinophilic esophagitis.  A. DISTAL ESOPHAGEAL BIOPSY:       Esophageal squamous mucosa with intramucosal  eosinophils and  eosinophilic abscesses (up to 70 eosinophils / high power filed),  consistent with eosinophilic esophagitis.   B. PROXIMAL ESOPHAGEAL BIOPSY:       Esophageal squamous mucosa with intramucosal eosinophils and (up to  30 eosinophils / high power filed), consistent with eosinophilic  esophagitis.   Last Colonoscopy: 2017 Normal ileocolonoscopy ( anal canal hemorrhoids)     Recommendations:   @ Filed Weights   09/20/24 1000  Weight: 140 lb (63.5 kg)     Past Medical History:  Diagnosis Date   Anemia    Atrial fibrillation (HCC)    CHF (congestive heart failure) (HCC)    Chronic kidney disease, unspecified 01/14/2020  Coronary artery disease    a. h/o MI in 2000 w/ prior LCX stenting;  b. 7.2014 Abnl Cardiolite , EF 41%, mod-large inferolat scar;  c. 07/2013 Cath/PCI: LM nl, LAD 10-20, D1 40-50p, LCX 95-99 @ distal stent margin (2.5x20 Promus Premier DES), RCA dom, 40p, 47m, 70d, EF 35-40%.   Elevated cholesterol    GERD (gastroesophageal reflux disease)    Graves disease 04/2020   s/p I-131 thyroid  ablation    Hemorrhoids    Hypertension    Hyperthyroidism 09/27/2020   Ischemic cardiomyopathy    a. 07/2013 EF 35-40% by LV gram.; EF 50% on ECHO in November 2020   Myocardial infarction [facility])    Reflux esophagitis    Spondylosis of cervical joint    C3-4, C6-7   Tobacco user    Vitamin D  deficiency disease 08/24/2019    Past Surgical History:  Procedure Laterality Date   BIOPSY  01/09/2018   Procedure: BIOPSY;  Surgeon: Shaaron Lamar HERO, MD;  [facility]: [facility];  Service: Endoscopy;;  esophageal biopsies   CARDIAC CATHETERIZATION     CARDIOVERSION N/A 02/18/2022   Procedure: CARDIOVERSION;  Surgeon: Rolan Ezra RAMAN, MD;  Location: [facility] ENDOSCOPY;  Service: Cardiovascular;  Laterality: N/A;   COLONOSCOPY     COLONOSCOPY N/A 01/25/2016   RMR: normal ileocolonoscopy ( anal canal hemorrhoids)   CORONARY ANGIOPLASTY WITH STENT PLACEMENT  08/23/2013   mid  circumflex  DES    by Dr Jordan   CORONARY STENT PLACEMENT  2000   ESOPHAGEAL DILATION N/A 08/17/2024   Procedure: DILATION, ESOPHAGUS;  Surgeon: Cinderella Deatrice FALCON, MD;  Location: [facility];  Service: Endoscopy;  Laterality: N/A;   ESOPHAGOGASTRODUODENOSCOPY N/A 01/25/2016   RMR: Reflux esophagitits. Cervical web with passage of the scope. Status post esophageal biosy. Hiatal hernia.    ESOPHAGOGASTRODUODENOSCOPY N/A 01/09/2018   Rourk: cervical/proximal esophageal web s/p dilation, markedly abnormal esophagus c/w biopsy proven eosinophilic esophagitis. small hiatal hernia.   ESOPHAGOGASTRODUODENOSCOPY N/A 05/31/2020   Procedure: ESOPHAGOGASTRODUODENOSCOPY (EGD);  Surgeon: Shaaron Lamar HERO, MD;  Location: [facility];  Service: Endoscopy;  Laterality: N/A;  8:30am   ESOPHAGOGASTRODUODENOSCOPY N/A 08/17/2024   Procedure: EGD (ESOPHAGOGASTRODUODENOSCOPY);  Surgeon: Cinderella Deatrice FALCON, MD;  Location: [facility];  Service: Endoscopy;  Laterality: N/A;  7:30 am, asa 1-2   LEFT HEART CATHETERIZATION WITH CORONARY ANGIOGRAM N/A 08/23/2013   Procedure: LEFT HEART CATHETERIZATION WITH CORONARY ANGIOGRAM;  Surgeon: Peter M Jordan, MD;  Location: [facility] CATH LAB;  Service: Cardiovascular;  Laterality: N/A;   MALONEY DILATION N/A 01/09/2018   Procedure: AGAPITO DILATION;  Surgeon: Shaaron Lamar HERO, MD;  Location[facility];  Service: Endoscopy;  Laterality: N/A;   MALONEY DILATION N/A 05/31/2020   Procedure: AGAPITO DILATION;  Surgeon: Shaaron Lamar HERO, MD;  Location: [facility];  Service: Endoscopy;  Laterality: N/A;   RIGHT/LEFT HEART CATH AND CORONARY ANGIOGRAPHY N/A 01/16/2022   Procedure: RIGHT/LEFT HEART CATH AND CORONARY ANGIOGRAPHY;  Surgeon: Claudene Victory ORN, MD;  Location: [facility] INVASIVE CV LAB;  Service: Cardiovascular;  Laterality: N/A;   TEE WITHOUT CARDIOVERSION N/A 02/18/2022   Procedure: TRANSESOPHAGEAL ECHOCARDIOGRAM (TEE);  Surgeon: Rolan Ezra RAMAN, MD;  Location: [facility] ENDOSCOPY;  Service:  Cardiovascular;  Laterality: N/A;   TRANSURETHRAL RESECTION OF PROSTATE  01/09/2012   Procedure: TRANSURETHRAL RESECTION OF THE PROSTATE (TURP);  Surgeon: Mohammad I Javaid, MD;  Location: [facility] ORS;  Service: Urology;  Laterality: N/A;    Current Outpatient Medications  Medication Sig Dispense Refill  amiodarone  (PACERONE ) 200 MG tablet TAKE 1 TABLET BY MOUTH DAILY 90 tablet 3   aspirin  EC 81 MG tablet Take 81 mg by mouth daily. Swallow whole.     atorvastatin  (LIPITOR ) 80 MG tablet TAKE 1 TABLET BY MOUTH DAILY 90 tablet 3   Budesonide  ([name] ) 2 MG/10ML SUSP Take 2 mg by mouth 2 (two) times daily.     Cholecalciferol  (VITAMIN D -3) 125 MCG (5000 UT) TABS Take 1 tablet by mouth once a week.     digoxin  (LANOXIN ) 0.125 MG tablet Take 0.5 tablets (0.0625 mg total) by mouth every other day. 45 tablet 3   hydrALAZINE  (APRESOLINE ) 50 MG tablet Take 1.5 tablets (75 mg total) by mouth 3 (three) times daily. 405 tablet 2   isosorbide  mononitrate (IMDUR ) 60 MG 24 hr tablet Take 1 tablet (60 mg total) by mouth daily. 90 tablet 3   nitroGLYCERIN  (NITROSTAT ) 0.4 MG SL tablet Place 1 tablet (0.4 mg total) under the tongue every 5 (five) minutes as needed for chest pain. 25 tablet 3   pantoprazole  (PROTONIX ) 40 MG tablet Take 1 tablet (40 mg total) by mouth 2 (two) times daily. 90 tablet 3   potassium chloride  (KLOR-CON ) 10 MEQ tablet TAKE 1 TABLET BY MOUTH ONCE DAILY 90 tablet 0   spironolactone  (ALDACTONE ) 25 MG tablet Take 0.5 tablets (12.5 mg total) by mouth daily. 45 tablet 2   dupilumab  (DUPIXENT ) 300 MG/2ML prefilled syringe Inject 300 mg into the skin once a week. (Patient not taking: Reported on 09/20/2024) 24 mL 3   No current facility-administered medications for this visit.    Allergies as of 09/20/2024 - Review Complete 09/20/2024  Allergen Reaction Noted   Peanut butter flavoring agent (non-screening) Swelling 01/10/2022    Social History   Socioeconomic History   Marital status:  Married    Spouse name: Vernell   Number of children: Not on file   Years of education: 11th grade   Highest education level: 11th grade  Occupational History   Occupation: merchandiser, retail  Tobacco Use   Smoking status: Every Day    Current packs/day: 0.50    Average packs/day: 0.5 packs/day for 58.8 years (29.4 ttl pk-yrs)    Types: Cigarettes    Start date: 12/09/1965   Smokeless tobacco: Never   Tobacco comments:    3/4 pack of cigarettes daily; smoked since 78 years old  Vaping Use   Vaping status: Never Used  Substance and Sexual Activity   Alcohol  use: No    Alcohol /week: 0.0 standard drinks of alcohol     Comment: No bowel since year 2000.  Some previous heavy use.   Drug use: No   Sexual activity: Yes    Birth control/protection: None  Other Topics Concern   Not on file  Social History Narrative   Pt gets regular exercise.Married for 22 years.Retired but still engineering geologist.   Social Drivers of Corporate Investment Banker Strain: Low Risk  (01/23/2022)   Overall Financial Resource Strain (CARDIA)    Difficulty of Paying Living Expenses: Not very hard  Recent Concern: Financial Resource Strain - Medium Risk (01/10/2022)   [facility]   Overall Financial Resource Strain (CARDIA)    Difficulty of Paying Living Expenses: Somewhat hard  Food Insecurity: No Food Insecurity (01/23/2022)   Hunger Vital Sign    Worried About Running Out of Food in the Last Year: Never true    Ran Out of Food in the Last Year: Never true  Transportation Needs: No Transportation Needs (01/23/2022)   PRAPARE - Administrator, Civil Service (Medical): No    Lack of Transportation (Non-Medical): No  Physical Activity: Sufficiently Active (08/23/2019)   Exercise Vital Sign    Days of Exercise per Week: 5 days    Minutes of Exercise per Session: 30 min  Stress: No Stress Concern Present (08/23/2019)   [employer] of Occupational Health - Occupational  Stress Questionnaire    Feeling of Stress : Not at all  Social Connections: Not on file    Review of systems General: negative for malaise, night sweats, fever, chills, weight loss Neck: Negative for lumps, goiter, pain and significant neck swelling Resp: Negative for cough, wheezing, dyspnea at rest CV: Negative for chest pain, leg swelling, palpitations, orthopnea GI: denies melena, hematochezia, nausea, vomiting, diarrhea, constipation, dysphagia, odyonophagia, early satiety or unintentional weight loss.  MSK: Negative for joint pain or swelling, back pain, and muscle pain. Derm: Negative for itching or rash Psych: Denies depression, anxiety, memory loss, confusion. No homicidal or suicidal ideation.  Heme: Negative for prolonged bleeding, bruising easily, and swollen nodes. Endocrine: Negative for cold or heat intolerance, polyuria, polydipsia and goiter. Neuro: negative for tremor, gait imbalance, syncope and seizures. The remainder of the review of systems is noncontributory.  Physical Exam: BP (!) 153/76   Pulse (!) 54   Temp (!) 97.5 F (36.4 C)   Ht 5' 5 (1.651 m)   Wt 140 lb (63.5 kg)   BMI 23.30 kg/m  General:   Alert and oriented. No distress noted. Pleasant and cooperative.  Head:  Normocephalic and atraumatic. Eyes:  Conjuctiva clear without scleral icterus. Mouth:  Oral mucosa pink and moist. Good dentition. No lesions. Heart: Normal rate and rhythm, s1 and s2 heart sounds present.  Lungs: Clear lung sounds in all lobes. Respirations equal and unlabored. Abdomen:  +BS, soft, non-tender and non-distended. No rebound or guarding. No HSM or masses noted. Derm: No palmar erythema or jaundice Msk:  Symmetrical without gross deformities. Normal posture. Extremities:  Without edema. Neurologic:  Alert and  oriented x4 Psych:  Alert and cooperative. Normal mood and affect.  Invalid input(s): 6 MONTHS   ASSESSMENT: Ronald Mcdowell is a 78 y.o. male presenting today  for follow up of EOE/dysphagia  Recent EGD with EOE. Advised to continue PPI BID, start budesonide  slurry and dupixent . Notably has not tried elimination diet in the past. Currently on samples of eohilia . Has filled out patient assistance for dupixent  and is awaiting to hear back on this. Seems that [facility] tried to contact the patient regarding cost of budesonide  slurry but patient states he had not heard from them. We contacted [facility] while patient was in the office today to make them aware he is ok with the cost and will pick up the budesonide  slurry once it is ready. We will continue to await to hear back on dupixent  patient assistance, at this time he is feeilng much better with eohilia  slurry, dysphagia has improved quite a bit and he is not having choking episodes with eating. Will continue current regimen, implement 6 food elimination diet for the next 6-8 weeks.    PLAN:  -continue PPI BID  -continue budesonide  slurry (can pick up compounded version at pharmacy)  -await response regarding dupixent  patient assistance  -6 food elimination diet x6-8 weeks (handout provided)  -consider scheduling repeat EGD at follow up visit   All questions were answered, patient verbalized understanding and  is in agreement with plan as outlined above.   Follow Up: 1 month  Myrtle Barnhard L. Marycarmen Hagey, MSN, APRN, AGNP-C Adult-Gerontology Nurse Practitioner [facility]

## 2024-10-08 ENCOUNTER — Encounter (INDEPENDENT_AMBULATORY_CARE_PROVIDER_SITE_OTHER): Payer: Self-pay | Admitting: *Deleted

## 2024-10-18 ENCOUNTER — Encounter (INDEPENDENT_AMBULATORY_CARE_PROVIDER_SITE_OTHER): Admitting: Gastroenterology

## 2024-10-18 ENCOUNTER — Encounter (INDEPENDENT_AMBULATORY_CARE_PROVIDER_SITE_OTHER): Payer: Self-pay | Admitting: Gastroenterology

## 2024-11-09 ENCOUNTER — Telehealth (INDEPENDENT_AMBULATORY_CARE_PROVIDER_SITE_OTHER): Payer: Self-pay | Admitting: Gastroenterology

## 2024-11-09 ENCOUNTER — Encounter (INDEPENDENT_AMBULATORY_CARE_PROVIDER_SITE_OTHER): Payer: Self-pay | Admitting: Gastroenterology

## 2024-11-09 NOTE — Telephone Encounter (Signed)
 Pt missed his last OV with Chelsea on 10/18/2024. I have left message for him to call the office to reschedule and also sent him a letter to do the same.

## 2024-11-24 ENCOUNTER — Encounter: Payer: Self-pay | Admitting: *Deleted

## 2024-12-03 ENCOUNTER — Other Ambulatory Visit (HOSPITAL_COMMUNITY): Payer: Self-pay | Admitting: Cardiology

## 2024-12-16 NOTE — Telephone Encounter (Signed)
 Reached out to wife Vernell to get updated information in regards to Dupixent . Left message to return call

## 2025-02-07 ENCOUNTER — Ambulatory Visit: Admitting: Cardiology
